# Patient Record
Sex: Male | Born: 1955 | Race: White | Hispanic: No | Marital: Married | Smoking: Never smoker
Health system: Southern US, Community
[De-identification: ages and names within clinical notes are randomized; demographics above are authoritative.]

## PROBLEM LIST (undated history)

## (undated) DIAGNOSIS — I10 Essential (primary) hypertension: Secondary | ICD-10-CM

## (undated) DIAGNOSIS — Z8349 Family history of other endocrine, nutritional and metabolic diseases: Secondary | ICD-10-CM

## (undated) DIAGNOSIS — G610 Guillain-Barre syndrome: Secondary | ICD-10-CM

## (undated) DIAGNOSIS — E785 Hyperlipidemia, unspecified: Secondary | ICD-10-CM

## (undated) DIAGNOSIS — I493 Ventricular premature depolarization: Secondary | ICD-10-CM

## (undated) DIAGNOSIS — G4730 Sleep apnea, unspecified: Secondary | ICD-10-CM

## (undated) DIAGNOSIS — K37 Unspecified appendicitis: Secondary | ICD-10-CM

## (undated) DIAGNOSIS — Z79899 Other long term (current) drug therapy: Secondary | ICD-10-CM

## (undated) DIAGNOSIS — K5731 Diverticulosis of large intestine without perforation or abscess with bleeding: Secondary | ICD-10-CM

## (undated) DIAGNOSIS — C61 Malignant neoplasm of prostate: Secondary | ICD-10-CM

## (undated) DIAGNOSIS — E119 Type 2 diabetes mellitus without complications: Secondary | ICD-10-CM

## (undated) DIAGNOSIS — R609 Edema, unspecified: Secondary | ICD-10-CM

## (undated) DIAGNOSIS — I272 Pulmonary hypertension, unspecified: Secondary | ICD-10-CM

## (undated) DIAGNOSIS — D696 Thrombocytopenia, unspecified: Secondary | ICD-10-CM

## (undated) DIAGNOSIS — IMO0001 Reserved for inherently not codable concepts without codable children: Secondary | ICD-10-CM

## (undated) DIAGNOSIS — I2510 Atherosclerotic heart disease of native coronary artery without angina pectoris: Secondary | ICD-10-CM

## (undated) DIAGNOSIS — K921 Melena: Secondary | ICD-10-CM

## (undated) HISTORY — DX: Essential (primary) hypertension: I10

## (undated) HISTORY — DX: Malignant neoplasm of prostate: C61

## (undated) HISTORY — DX: Ventricular premature depolarization: I49.3

## (undated) HISTORY — DX: Sleep apnea, unspecified: G47.30

## (undated) HISTORY — PX: APPENDECTOMY: SHX54

## (undated) HISTORY — DX: Guillain-Barre syndrome: G61.0

## (undated) HISTORY — DX: Diverticulosis of large intestine without perforation or abscess with bleeding: K57.31

## (undated) HISTORY — PX: OTHER SURGICAL HISTORY: SHX169

## (undated) HISTORY — DX: Type 2 diabetes mellitus without complications: E11.9

## (undated) HISTORY — DX: Reserved for inherently not codable concepts without codable children: IMO0001

## (undated) HISTORY — DX: Other long term (current) drug therapy: Z79.899

## (undated) HISTORY — DX: Unspecified appendicitis: K37

## (undated) HISTORY — DX: Melena: K92.1

## (undated) HISTORY — DX: Thrombocytopenia, unspecified: D69.6

## (undated) HISTORY — DX: Edema, unspecified: R60.9

## (undated) HISTORY — DX: Family history of other endocrine, nutritional and metabolic diseases: Z83.49

## (undated) HISTORY — DX: Morbid (severe) obesity due to excess calories: E66.01

## (undated) HISTORY — DX: Atherosclerotic heart disease of native coronary artery without angina pectoris: I25.10

## (undated) HISTORY — DX: Hyperlipidemia, unspecified: E78.5

---
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
## 1995-01-16 DIAGNOSIS — I2510 Atherosclerotic heart disease of native coronary artery without angina pectoris: Secondary | ICD-10-CM

## 1995-01-16 HISTORY — PX: ANGIOPLASTY: SHX39

## 1995-01-16 HISTORY — DX: Atherosclerotic heart disease of native coronary artery without angina pectoris: I25.10

## 2004-02-07 ENCOUNTER — Ambulatory Visit (HOSPITAL_COMMUNITY): Admission: RE | Admit: 2004-02-07 | Discharge: 2004-02-07 | Payer: Self-pay | Admitting: Family Medicine

## 2006-06-06 LAB — HM COLONOSCOPY

## 2007-01-16 DIAGNOSIS — K5731 Diverticulosis of large intestine without perforation or abscess with bleeding: Secondary | ICD-10-CM

## 2007-01-16 HISTORY — DX: Diverticulosis of large intestine without perforation or abscess with bleeding: K57.31

## 2007-05-02 ENCOUNTER — Inpatient Hospital Stay (HOSPITAL_COMMUNITY): Admission: EM | Admit: 2007-05-02 | Discharge: 2007-05-06 | Payer: Self-pay | Admitting: Emergency Medicine

## 2007-05-05 ENCOUNTER — Encounter (INDEPENDENT_AMBULATORY_CARE_PROVIDER_SITE_OTHER): Payer: Self-pay | Admitting: Gastroenterology

## 2008-09-21 ENCOUNTER — Emergency Department (HOSPITAL_COMMUNITY): Admission: EM | Admit: 2008-09-21 | Discharge: 2008-09-21 | Payer: Self-pay | Admitting: Family Medicine

## 2008-12-29 ENCOUNTER — Ambulatory Visit (HOSPITAL_COMMUNITY): Admission: RE | Admit: 2008-12-29 | Discharge: 2008-12-29 | Payer: Self-pay | Admitting: Cardiology

## 2009-06-22 ENCOUNTER — Emergency Department (HOSPITAL_COMMUNITY): Admission: EM | Admit: 2009-06-22 | Discharge: 2009-06-22 | Payer: Self-pay | Admitting: Family Medicine

## 2010-01-15 DIAGNOSIS — C61 Malignant neoplasm of prostate: Secondary | ICD-10-CM

## 2010-01-15 HISTORY — DX: Malignant neoplasm of prostate: C61

## 2010-03-13 ENCOUNTER — Ambulatory Visit: Payer: 59 | Attending: Radiation Oncology | Admitting: Radiation Oncology

## 2010-03-13 DIAGNOSIS — Z79899 Other long term (current) drug therapy: Secondary | ICD-10-CM | POA: Insufficient documentation

## 2010-03-13 DIAGNOSIS — I10 Essential (primary) hypertension: Secondary | ICD-10-CM | POA: Insufficient documentation

## 2010-03-13 DIAGNOSIS — K219 Gastro-esophageal reflux disease without esophagitis: Secondary | ICD-10-CM | POA: Insufficient documentation

## 2010-03-13 DIAGNOSIS — Z800 Family history of malignant neoplasm of digestive organs: Secondary | ICD-10-CM | POA: Insufficient documentation

## 2010-03-13 DIAGNOSIS — C61 Malignant neoplasm of prostate: Secondary | ICD-10-CM | POA: Insufficient documentation

## 2010-03-13 DIAGNOSIS — I252 Old myocardial infarction: Secondary | ICD-10-CM | POA: Insufficient documentation

## 2010-03-13 DIAGNOSIS — E785 Hyperlipidemia, unspecified: Secondary | ICD-10-CM | POA: Insufficient documentation

## 2010-03-13 DIAGNOSIS — I2510 Atherosclerotic heart disease of native coronary artery without angina pectoris: Secondary | ICD-10-CM | POA: Insufficient documentation

## 2010-03-13 DIAGNOSIS — G4730 Sleep apnea, unspecified: Secondary | ICD-10-CM | POA: Insufficient documentation

## 2010-04-05 ENCOUNTER — Other Ambulatory Visit (HOSPITAL_COMMUNITY): Payer: Self-pay | Admitting: Cardiology

## 2010-04-05 DIAGNOSIS — I2510 Atherosclerotic heart disease of native coronary artery without angina pectoris: Secondary | ICD-10-CM

## 2010-04-13 ENCOUNTER — Other Ambulatory Visit: Payer: Self-pay | Admitting: Cardiology

## 2010-04-13 ENCOUNTER — Ambulatory Visit (HOSPITAL_COMMUNITY)
Admission: RE | Admit: 2010-04-13 | Discharge: 2010-04-13 | Disposition: A | Discharge status: Home / Self Care | Payer: 59 | Source: Ambulatory Visit | Attending: Cardiology | Admitting: Cardiology

## 2010-04-13 DIAGNOSIS — Z01810 Encounter for preprocedural cardiovascular examination: Secondary | ICD-10-CM | POA: Insufficient documentation

## 2010-04-13 DIAGNOSIS — I10 Essential (primary) hypertension: Secondary | ICD-10-CM | POA: Insufficient documentation

## 2010-04-13 DIAGNOSIS — I252 Old myocardial infarction: Secondary | ICD-10-CM | POA: Insufficient documentation

## 2010-04-20 ENCOUNTER — Institutional Professional Consult (permissible substitution): Payer: Commercial Managed Care - PPO | Admitting: Pulmonary Disease

## 2010-04-21 LAB — CULTURE, ROUTINE-ABSCESS

## 2010-05-01 ENCOUNTER — Encounter (HOSPITAL_COMMUNITY)
Admission: RE | Admit: 2010-05-01 | Discharge: 2010-05-01 | Disposition: A | Discharge status: Home / Self Care | Payer: 59 | Source: Ambulatory Visit | Attending: Cardiology | Admitting: Cardiology

## 2010-05-01 ENCOUNTER — Ambulatory Visit (HOSPITAL_COMMUNITY): Payer: Commercial Managed Care - PPO

## 2010-05-01 DIAGNOSIS — Z01810 Encounter for preprocedural cardiovascular examination: Secondary | ICD-10-CM | POA: Insufficient documentation

## 2010-05-01 DIAGNOSIS — I2510 Atherosclerotic heart disease of native coronary artery without angina pectoris: Secondary | ICD-10-CM | POA: Insufficient documentation

## 2010-05-01 DIAGNOSIS — I10 Essential (primary) hypertension: Secondary | ICD-10-CM | POA: Insufficient documentation

## 2010-05-02 ENCOUNTER — Encounter (HOSPITAL_COMMUNITY)
Admission: RE | Admit: 2010-05-02 | Discharge: 2010-05-02 | Disposition: A | Discharge status: Home / Self Care | Payer: 59 | Source: Ambulatory Visit | Attending: Cardiology | Admitting: Cardiology

## 2010-05-02 MED ORDER — TECHNETIUM TC 99M TETROFOSMIN IV KIT
30.0000 | PACK | Freq: Once | INTRAVENOUS | Status: AC | PRN
  Administered 2010-05-02: 30 via INTRAVENOUS

## 2010-05-02 MED ORDER — TECHNETIUM TC 99M TETROFOSMIN IV KIT
10.0000 | PACK | Freq: Once | INTRAVENOUS | Status: AC | PRN
  Administered 2010-05-02: 10 via INTRAVENOUS

## 2010-05-19 ENCOUNTER — Ambulatory Visit
Admission: RE | Admit: 2010-05-19 | Discharge: 2010-05-19 | Disposition: A | Discharge status: Home / Self Care | Payer: 59 | Source: Ambulatory Visit | Attending: Radiation Oncology | Admitting: Radiation Oncology

## 2010-05-19 ENCOUNTER — Other Ambulatory Visit: Payer: Self-pay | Admitting: Radiation Oncology

## 2010-05-19 DIAGNOSIS — C61 Malignant neoplasm of prostate: Secondary | ICD-10-CM

## 2010-05-30 NOTE — Op Note (Signed)
NAMEWEBER, MONNIER                 ACCOUNT NO.:  [id number]   MEDICAL RECORD NO.:  [id number]          PATIENT TYPE:  INP   LOCATION:  2623                         FACILITY:  [facility]   PHYSICIAN:  Danise Edge, M.D.   DATE OF BIRTH:  1955-06-26   DATE OF PROCEDURE:  DATE OF DISCHARGE:                               OPERATIVE REPORT   PROBLEM:  Lower gastrointestinal bleeding (passage of maroon-appearing  stool).   HISTORY:  Travis [name] is a 55 year old male born on 14-Feb-1955.  This morning, Travis [name] had 2-3 maroon-colored bowel movements  associated with feeling faint, but unassociated with any significant  abdominal pain.  He tells me that he has had blood per rectum in the  past, and he had a symptomatic external hemorrhoid.  He has a history of  coronary artery disease.  He takes enteric-coated aspirin daily.  He  uses Alka-Seltzer and Advil on a relative frequent basis.  There is no  past history of needing endoscopic evaluation of intestinal tract for  his bleeding.  He reports no nausea, vomiting, or hematemesis.   MEDICATION ALLERGIES:  None.   PAST MEDICAL/SURGICAL HISTORY:  1. Remote appendectomy.  2. Myocardial infarction in 1997, managed by Dr. Aleen Campi.  3. Hypertension.  4. Hyperlipidemia.  5. Hemorrhoids.  6. Gastroesophageal reflux.  7. History of Bell's palsy.  8. Osteoarthritis.   CHRONIC MEDICATIONS:  On admission:  1. Crestor.  2. Hydrochlorothiazide.  3. Lisinopril.  4. Toprol.  5. TriCor.  6. Enteric-coated aspirin.  7. Alka-Seltzer.   HABITS:  Travis [name] does not use tobacco products or consume alcohol.   SURGEON:  Danise Edge, MD   PREMEDICATION:  1. Fentanyl 50 mcg.  2. Versed 5 mg.   PROCEDURE:  After obtaining informed consent, Travis [name] was placed in  the left lateral decubitus position.  I administered intravenous  fentanyl and intravenous Versed to achieve conscious sedation for the  procedure.  The patient's blood  pressure, oxygen saturation, and cardiac  rhythm were monitored throughout the procedure and documented in the  medical record.   The Pentax gastroscope was passed through the posterior hypopharynx into  the proximal esophagus without difficulty.  The hypopharynx, larynx, and  vocal cords appeared normal.   Esophagoscopy:  The proximal mid and lower segments of the esophageal  mucosa appear normal.  Despite his history of gastroesophageal reflux, I  did not detect Barrett's esophagus or erosive esophagitis.   Gastroscopy:  Retroflex view of the gastric cardia and fundus was  normal.  Normal esophagogastroduodenoscopy.  No signs of upper  gastrointestinal bleeding.   RECOMMENDATIONS:  Travis [name] has a probable diverticular bleed.  There  is a 90% chance that he will stop on his own spontaneously.  He will  eventually need a diagnostic colonoscopy.           ______________________________  Danise Edge, M.D.     MJ/MEDQ  D:  05/02/2007  T:  05/03/2007  Job:  [id number]   cc:   Antionette Char, MD  Vikki Ports, M.D.

## 2010-05-30 NOTE — Op Note (Signed)
[name], Travis [name]                 ACCOUNT NO.:  [id number]   MEDICAL RECORD NO.:  [id number]           PATIENT TYPE:   LOCATION:                                 FACILITY:   PHYSICIAN:  Travis L. Malon Kindle., M.D.DATE OF BIRTH:  [birth year]   DATE OF PROCEDURE:  05/05/2007  DATE OF DISCHARGE:                               OPERATIVE REPORT   PROCEDURE:  Colonoscopy.   MEDICATIONS:  1. Fentanyl 75 mcg.  2. Versed 6 mg IV.   INDICATIONS:  GI bleed with upper endoscopy, negative.   DESCRIPTION OF PROCEDURE:  Procedure explained to the patient and  consent obtained.  Left lateral decubitus position, the adult  colonoscope was inserted and advanced.  The prep was excellent reach to  cecum without difficulty.  Ileocecal valve and appendiceal orifice seen.  Scope was withdrawn.  There was no active bleeding.  The mucosa was  mostly normal except in left colon, there were several small 2-3 mm  plaques.  It were biopsied.  They were slightly ulcerated, but no active  bleeding.  There are multiple small diverticula throughout the left  colon and sigmoid colon, but none were actively bleeding.  Scope was  withdrawn from the rectum.  Some hemorrhoids in the rectum fairly small  none were bleeding.  The scope was withdrawn.  The patient tolerated the  procedure well.   ASSESSMENT:  1. Lower gastrointestinal bleed almost certainly due to fairly      extensive diverticulosis in left colon.  2. Small colonic plaques probably insignificant with biopsy.   PLAN:  We will feed the patient, given diverticulosis information, and  follow up in the office in 2-3 weeks.           ______________________________  Travis Aliment Malon Kindle., M.D.     Waldron Session  D:  05/05/2007  T:  05/06/2007  Job:  [id number]   cc:   Travis [name], M.D.

## 2010-05-30 NOTE — H&P (Signed)
Travis [name], Travis [name]                 ACCOUNT NO.:  [id number]   MEDICAL RECORD NO.:  [id number]          PATIENT TYPE:  INP   LOCATION:  2623                         FACILITY:  [facility]   PHYSICIAN:  Ramiro Harvest, MD    DATE OF BIRTH:  1955-10-27   DATE OF ADMISSION:  05/02/2007  DATE OF DISCHARGE:                              HISTORY & PHYSICAL   PRIMARY CARE PHYSICIAN:  Dr. Lanell Persons   HISTORY OF PRESENT ILLNESS:  Travis [name] is a 55 year old obese white  male with history of hypertension, hemorrhoids, and hyperlipidemia on  daily enteric-coated aspirin and occasional Alka-Seltzer use who  presents to the ED with several-hour history of bright red blood per  rectum.  The patient states he woke up this morning and had a bowel  movement with some bright red blood in it.  He then went on to work and  thought he may have had diarrhea but had episodes of bloody stools.  The  patient started to feel very weak and lightheaded and decided to go  home.  The patient got home and got as far as his porch where he sat  down secondary to his lightheadedness and had another episode of bright  red blood per rectum with some left lower quadrant abdominal cramping  which he states is also sharp in nature.  The patient called his parents  who brought him to the ED where he had another episode of bright red  blood per rectum.  The patient endorses some palpitations and  generalized weakness.  The patient denies chest pain, no shortness of  breath, no nausea, no vomiting, no hematemesis, and no other associated  symptoms.  The patient denies any alcohol use.  No prior episodes of GI  bleed.  No history of a peptic ulcer disease.  In the ED, the patient  had a decrease in his blood pressure into the low 90s.  A CBC obtained  in the ED, he had a hemoglobin of 12.5.  We call to admit patient for  further evaluation and management.   ALLERGIES:  No known drug allergies.   PAST MEDICAL HISTORY:  1.  Hypertension.  2. Hyperlipidemia.  3. Hemorrhoids.  4. Gastroesophageal reflux disease.  5. Allergies.  6. Status post appendectomy in 1977.  7. Questionable history of light MI per the patient in 1997.  8. History of Bell's palsy.  9. Obstructive sleep apnea.   HOME MEDICATIONS:  1. Crestor 20 mg daily.  2. HCTZ 25 mg daily.  3. Lisinopril 20 mg daily.  4. Toprol-XL 20 mg daily.  5. TriCor 145 mg daily.  6. Enteric-coated aspirin 325 mg daily.  7. Alka-Seltzer as needed.   SOCIAL HISTORY:  No tobacco use.  No alcohol use.  No IV drug use.  The  patient is married, works at [employer] in the [employer],  and the patient has no children.   FAMILY HISTORY:  Father alive at age 35, history of hyperlipidemia,  hypertension, colonic polyps, and hernia.  Mother alive at age 49  history of gastroesophageal reflux disease,  hypertension, and atrial  fibrillation and has one sister with hypertension.  No family history of  GI bleed.   REVIEW OF SYSTEMS:  As per HPI, otherwise negative.   PHYSICAL EXAMINATION:  VITAL SIGNS:  Temperature 97.0, blood pressure  128/89 to systolic blood pressure of 96, pulse of 99, respiratory rate  20, satting 100% on room air.  GENERAL: The patient is pale in no apparent distress.  HEENT: Normocephalic and atraumatic.  Pupils are equal, round, and  reactive to light.  Extraocular movements intact.  Oropharynx is clear  and dry.  No lesions or exudates.  NECK:  Supple.  No lymphadenopathy.  RESPIRATORY:  Lungs are clear to auscultation bilaterally.  No wheezes.  No rhonchi.  CARDIOVASCULAR: Regular rate and rhythm.  No murmurs, rubs, or gallops.  ABDOMEN: Soft, nontender, and nondistended.  Positive bowel sounds.  EXTREMITIES: No clubbing, cyanosis, or edema.  NEUROLOGIC:  The patient is alert and oriented x3.  Cranial nerves II  through XII are grossly intact.  No focal deficits.  5/5 bilateral upper  extremity strength and 5/5 bilateral  lower extremity strength.  Sensation is intact.  Gait not tested secondary to safety.   LABORATORY DATA:  Sodium 140, potassium 3.2, chloride 103, bicarb 26,  BUN 22, creatinine 0.91, glucose 184, calcium of 8.8, bilirubin 0.3, alk  phosphatase 50, AST 33, ALT 32, protein 6.2, albumin 3.5, PTT 29, PT  14.7, INR 1.1, and FOBT was positive.  CBC, white count 15.2, hemoglobin  12.5, platelets 358, hematocrit 35.7, and ANC of 13.1.   ASSESSMENT AND PLAN:  Mr. Travis [name] is a 55 year old gentleman with  history of hemorrhoids, history of hypertension, and hyperlipidemia,  daily coated aspirin use and Alka-Seltzer as needed who presents to the  ED with bright red blood per rectum.  1. GI bleed, likely a lower GI bleed secondary to diverticular disease      versus upper GI bleed.  We will type and cross, place 2 large-bore      IVs and make the patient n.p.o., admit to the step-down unit at 2      liters normal saline bolus and then on 125 mL an hour.  Check CBCs      now and then q.8 hours, hold aspirin.  Protonix 40 mg IV q. 12,      will consult with GI for possible scope and further evaluation and      recommendations.  2. Hypertension.  Hold blood pressure medications secondary to GI      bleed.  3. Hypokalemia, replete.  4. Leukocytosis.  The patient is afebrile without any acute need for      antibiotics at this time likely secondary to GI bleed.  We will      check his urinalysis and follow.  5. Hyperlipidemia.  Check a fasting lipid panel.  Continue home      regimen of Crestor and Tricor.  6. Gastroesophageal reflux disease, Protonix.  7. Hemorrhoids.  8. Obstructive sleep apnea CPAP nightly.  9. Allergies.  10.Prophylaxis, Protonix for GI prophylaxis, and SCDs for DVT      prophylaxis.   It was a pleasure taking care of Mr. Travis [name].      Ramiro Harvest, MD  Electronically Signed     DT/MEDQ  D:  05/02/2007  T:  05/03/2007  Job:  [id number]   cc:   Vikki Ports, M.D.

## 2010-06-02 NOTE — Discharge Summary (Signed)
[name], [name]                 ACCOUNT NO.:  [id number]   MEDICAL RECORD NO.:  [id number]          PATIENT TYPE:  INP   LOCATION:  5002                         FACILITY:  [facility]   PHYSICIAN:  Ramiro Harvest, MD    DATE OF BIRTH:  1955/06/01   DATE OF ADMISSION:  05/02/2007  DATE OF DISCHARGE:  05/06/2007                               DISCHARGE SUMMARY   DISCHARGE DIAGNOSES:  1. Lower gastrointestinal bleed likely secondary to a diverticular      bleed and colonic ulcerations.  2. Hypertension.  3. Hyperlipidemia.  4. Obstructive sleep apnea.  5. Hypokalemia.  6. Leukocytosis secondary to lower gastrointestinal bleed likely      secondary to a diverticular bleed and colonic ulcerations.  7. Gastroesophageal reflux disease.  8. Hemorrhoids.  9. History of Bell's palsy.  10.Seasonal allergies.  11.Status post appendectomy.  12.History of myocardial infarction in 1997.   DISCHARGE MEDICATIONS:  1. MiraLax q. daily.  2. Crestor 20 mg p.o. q. daily  3. HCTZ 25 mg p.o. q. daily  4. Lisinopril 20 mg p.o. q. daily.  5. Toprol-XL 20 mg p.o. q. daily.  6. Tricor 145 mg p.o. q. daily.  7. Enteric coated aspirin 325 mg p.o. q. Daily.   DISPOSITION AND FOLLOW-UP:  The patient will be discharged home to  follow up with gastroenterologist, Dr. Randa Evens in the next 2-3 weeks  post discharge.   CONSULTATIONS DONE:  A GI consult was obtained on May 02, 2007.  The  patient was seen in consultation by Dr. Laural Benes of [facility].   PROCEDURES PERFORMED:  1. EGD was performed on May 02, 2007, which showed mid and lower      segments of the esophageal mucosa were normal.  Negative for      Barrett's esophagus, negative for erosive esophagitis.  Retroflex      few of the gastric cardia and fundus was normal.  No signs of upper      GI bleed.  2. A colonoscopy was performed on May 05, 2007, which shows a lower      GI bleed almost certainly due to firmly extensive diverticulosis in   the left colon, small colonic plaques probably insignificant with      biopsy.   BRIEF ADMISSION HISTORY AND PHYSICAL:  Mr. Travis [name] is a 55 year old  white obese male with a history of hypertension, hemorrhoids,  hyperlipidemia on daily enteric-coated aspirin, and occasional Alka-  Seltzer, who presented to the ED with a several-hour history of bright  red blood per rectum.  The patient stated that he woke up on the morning  of admission had a bowel movement and saw some bright red blood in it.  He then went on to work and thought he may have had diarrhea but had  episodes of bloody stools.  The patient started to feel very weak and  lightheaded and decided to go back home.  The patient got home, got as  far as the bouts of porch when he sat down and secondary to his  lightheadedness, he also had another episode  of bright red blood per  rectum with some left lower quadrant abdominal cramping, which she  stated was also sharp in nature.  The patient called his parents who  brought him to the emergency room.  He had another episode of bright red  blood per rectum in the ED.  The patient endorsed some palpitations and  generalized weakness.  The patient denied chest pain or shortness of  breath.  No nausea, no vomiting, or hematemesis.  No other associated  symptoms.  The patient denied any alcohol use.  No prior episodes of GI  bleed.  No history of peptic ulcer disease.  In the ED, the patient had  a decrease in his blood pressure into the low 90s.  CBC was obtained in  the ED with a hemoglobin of 12.5.  We were called to admit the patient  for further evaluation and management.   PHYSICAL EXAMINATION:  Temperature 97.0, blood pressure 128/89-96  systolic, pulse of 99, respiratory rate 20, sating 100% on room air.  GENERAL:  The patient was obese male who was pale and in no apparent  distress.  HEENT:  Normocephalic, atraumatic.  Pupils equal, round, and reactive to  light.   Extraocular movements intact.  Oropharynx was clear, dry, and no  lesions.  No exudates.  NECK:  Supple.  No lymphadenopathy.  RESPIRATORY:  Lungs were clear to auscultation bilaterally.  No wheezes,  no rhonchi.  CARDIOVASCULAR:  Regular rate and rhythm.  No murmurs, rubs, or gallops.  ABDOMEN:  Soft, nontender, nondistended, positive bowel sounds.  EXTREMITIES:  No clubbing, cyanosis or edema.  NEUROLOGICAL:  The patient was alert and oriented x3.  Cranial nerves II-  XII was grossly intact.  No focal deficits, 5/5 bilateral upper  extremity strength, 5/5 bilateral lower extremity strength.  Sensation  was intact.  Gait was not tested secondary to safety.   LABORATORIES:  Sodium 140, potassium 3.2, chloride 103, bicarb 26, BUN  22, creatinine 0.91, glucose 184, calcium was 8.8, bilirubin 0.3, alk  phosphatase 50, ANC 33, ALT 32, protein 6.2, albumin 3.5, PTT 29, PT  14.7, INR 1.1.  FOBT was positive.  CBC:  White count 15.2, hemoglobin  12.5, platelets 358, hematocrit 35.7, ANC of 13.1.   HOSPITAL COURSE:  1. Lower GI bleed secondary to diverticular bleed and colonic      ulcerations.  The patient was initially admitted to the step-down      unit secondary to systolic blood pressures in the 90s.  The patient      was made n.p.o.  Two large-bore IVs were placed.  The patient was      placed on fluid with aggressive fluid resuscitation.  CBC was      checked every 8 hours.  The patient was placed on IV Protonix      b.i.d.  The patient's aspirin was held and a GI consult was      obtained.  The patient was seen in consultation.  An EGD was done      with results as stated above which was negative.  The patient on      the second day of hospitalization, still was passing some blood      clots per rectum.  The patient was thus scheduled for a      colonoscopy.  The patient's hemoglobin decreased as low as 8.2, but      then remained stable and did not decrease any further.  The  patient  was typed and crossed 2 units which were held, as the patient's      CBCs were monitored.  The patient's symptoms improved.  The patient      did not have any more bowel movements pass the second day of      hospitalization.  The patient's weakness started to improve and the      patient did no longer have any lightheadedness.  A colonoscopy was      done on May 05, 2007 with results as stated above.  The patient      remained stable for the rest of the hospitalization and the patient      was discharged in stable and improved condition to follow up with      his gastroenterologist, Dr. Randa Evens in 2-3 weeks post discharge.      The patient was discharged in a stable and improved condition.  2. Hypertension.  The patient's blood pressure medications were held      throughout the hospitalization secondary to lower GI bleed      secondary to diverticular bleed and colonic ulcerations and were      restarted on discharge.  3. Hyperlipidemia, stable.  The patient was maintained on his home      doses of Crestor and Tricor whilst in the hospital.  4. Obstructive sleep apnea, stable.  The patient was maintained on a      CPAP during the hospitalization.  5. Hypokalemia.  The patient's potassium was repleted and remained      stable for the rest of the hospitalization.  The rest of the      patient's chronic medical issues were stable throughout the      hospitalization, and on the day of discharge, the patient was in      stable and improved condition with no more GI bleeds.   Vital Signs on the day of discharge, temperature 98.5, pulse of 77,  blood pressure 148/96, respiratory rate 80, sating 96% on room air.   DISCHARGE LABORATORIES:  B-MET sodium 136, potassium 3.6, chloride 104,  bicarb 25, BUN 9, creatinine 0.98, and glucose of 111 and a calcium of  8.3.  CBC on day of discharge, white count 6.7, hemoglobin 9.4,  hematocrit 26.5, platelets 269.   It was a pleasure  taking care of Mr. Junker.      Ramiro Harvest, MD  Electronically Signed     DT/MEDQ  D:  06/12/2007  T:  06/12/2007  Job:  [id number]   cc:   Vikki Ports, M.D.  James L. Malon Kindle., M.D.  Danise Edge, M.D.

## 2010-06-16 ENCOUNTER — Other Ambulatory Visit: Payer: Self-pay | Admitting: Urology

## 2010-06-16 ENCOUNTER — Encounter (HOSPITAL_COMMUNITY): Payer: 59

## 2010-06-16 LAB — COMPREHENSIVE METABOLIC PANEL
ALT: 32 U/L (ref 0–53)
AST: 31 U/L (ref 0–37)
Albumin: 3.6 g/dL (ref 3.5–5.2)
Alkaline Phosphatase: 74 U/L (ref 39–117)
BUN: 16 mg/dL (ref 6–23)
CO2: 26 mEq/L (ref 19–32)
Calcium: 9 mg/dL (ref 8.4–10.5)
Chloride: 101 mEq/L (ref 96–112)
Creatinine, Ser: 0.79 mg/dL (ref 0.4–1.5)
GFR calc Af Amer: >60 mL/min (ref >60)
GFR calc non Af Amer: >60 mL/min (ref >60)
Glucose, Bld: 93 mg/dL (ref 70–99)
Potassium: 3.8 mEq/L (ref 3.5–5.1)
Sodium: 137 mEq/L (ref 135–145)
Total Bilirubin: 0.4 mg/dL (ref 0.3–1.2)
Total Protein: 7.1 g/dL (ref 6.0–8.3)

## 2010-06-16 LAB — CBC
HCT: 39.5 % (ref 39.0–52.0)
Hemoglobin: 13.1 g/dL (ref 13.0–17.0)
MCH: 28.6 pg (ref 26.0–34.0)
MCHC: 33.2 g/dL (ref 30.0–36.0)
MCV: 86.2 fL (ref 78.0–100.0)
Platelets: 219 10*3/uL (ref 150–400)
RBC: 4.58 MIL/uL (ref 4.22–5.81)
RDW: 13.4 % (ref 11.5–15.5)
WBC: 8.8 10*3/uL (ref 4.0–10.5)

## 2010-06-16 LAB — SURGICAL PCR SCREEN
MRSA, PCR: NEGATIVE
Staphylococcus aureus: NEGATIVE

## 2010-06-16 LAB — PROTIME-INR
INR: 1.19 (ref 0.00–1.49)
Prothrombin Time: 15.3 seconds — ABNORMAL HIGH (ref 11.6–15.2)

## 2010-06-16 LAB — APTT: aPTT: 37 seconds (ref 24–37)

## 2010-06-23 ENCOUNTER — Ambulatory Visit (HOSPITAL_COMMUNITY)
Admission: RE | Admit: 2010-06-23 | Discharge: 2010-06-23 | Disposition: A | Discharge status: Home / Self Care | Payer: 59 | Source: Ambulatory Visit | Attending: Urology | Admitting: Urology

## 2010-06-23 ENCOUNTER — Ambulatory Visit (HOSPITAL_COMMUNITY): Payer: 59

## 2010-06-23 DIAGNOSIS — Z79899 Other long term (current) drug therapy: Secondary | ICD-10-CM | POA: Insufficient documentation

## 2010-06-23 DIAGNOSIS — G4733 Obstructive sleep apnea (adult) (pediatric): Secondary | ICD-10-CM | POA: Insufficient documentation

## 2010-06-23 DIAGNOSIS — I10 Essential (primary) hypertension: Secondary | ICD-10-CM | POA: Insufficient documentation

## 2010-06-23 DIAGNOSIS — I252 Old myocardial infarction: Secondary | ICD-10-CM | POA: Insufficient documentation

## 2010-06-23 DIAGNOSIS — Z01812 Encounter for preprocedural laboratory examination: Secondary | ICD-10-CM | POA: Insufficient documentation

## 2010-06-23 DIAGNOSIS — I2510 Atherosclerotic heart disease of native coronary artery without angina pectoris: Secondary | ICD-10-CM | POA: Insufficient documentation

## 2010-06-23 DIAGNOSIS — N323 Diverticulum of bladder: Secondary | ICD-10-CM | POA: Insufficient documentation

## 2010-06-23 DIAGNOSIS — C61 Malignant neoplasm of prostate: Secondary | ICD-10-CM | POA: Insufficient documentation

## 2010-06-23 DIAGNOSIS — Z01818 Encounter for other preprocedural examination: Secondary | ICD-10-CM | POA: Insufficient documentation

## 2010-07-06 NOTE — Op Note (Signed)
Travis [name], Travis [name]                 ACCOUNT NO.:  [id number]  MEDICAL RECORD NO.:  [id number]  LOCATION:  DAYL                         FACILITY:  [facility]  PHYSICIAN:  Heleena Miceli C. Vernie Ammons, M.D.  DATE OF BIRTH:  12-20-1955  DATE OF PROCEDURE:  06/23/2010 DATE OF DISCHARGE:                              OPERATIVE REPORT   PREOPERATIVE DIAGNOSIS:  Adenocarcinoma of the prostate.  POSTOPERATIVE DIAGNOSIS:  Adenocarcinoma of the prostate.  PROCEDURES: 1. I-125 seed implant. 2. Cystoscopy with removal of foreign body.  SURGEON:  Silverio Hagan C. Vernie Ammons, M.D.  ANESTHESIA:  General.  DRAINS:  A 16-French Foley catheter.  SPECIMENS:  None.  BLOOD LOSS:  Minimal.  COMPLICATIONS:  None.  INDICATIONS:  The patient is a 55 year old male with biopsy-proven adenocarcinoma of the prostate found due to PSA elevation of 7.92.  His biopsy revealed Gleason 6 adenocarcinoma in 4/12 cores with no prostate abnormality giving him clinical stage T1c.  The treatment options, risks, complications and alternatives have been discussed and he has elected to proceed with radioactive seed implantation.  DESCRIPTION OF OPERATION:  After informed consent, the patient was brought to the major OR, placed on table and administered general anesthesia and then moved to the dorsal lithotomy position and his genitalia as well as perineum were sterilely prepped and draped.  A 16- French Foley catheter was then placed in the bladder and the balloon was filled with dilute contrast.  A transrectal ultrasound probe as well as rectal tube were then placed in the rectum and this was affixed to the stand.  Real-time ultrasonography was then used in conjunction with the planning software Spot Pro version 3.1 and a treatment plan was then devised.  After the treatment plan was complete, a second time-out was performed and I then implanted 21 needles with the placement of 84 seeds into the prostate under direct ultrasonographic  direction using the Nucletron and this proceeded without difficulty.  At the end of the procedure the transrectal ultrasound probe as well as rectal tube and Foley catheter were then removed.  Cystoscopy was then performed using the 17-French flexible scope.  The 17-French flexible scope was then passed under direct vision down the urethra which was noted be normal.  The prostatic urethra revealed bilobar hypertrophy and some slight elongation of the prostatic urethra. There was a single spacer identified protruding from the mid prostatic urethral region at approximately the 7 o'clock position out of the prostate and into the bladder.  I then entered the bladder and noted 1+ trabeculation.  There were 2 moderate sized wide-mouth diverticuli on the left floor and left wall of the bladder.  These were scoped and noted to be free of any lesions.  The remainder of the bladder was fully and systematically inspected and noted to be free of any tumors or stones.  Two spacers were identified on the floor of the bladder.  I used the flexible alligator forceps and passed through the flexible cystoscope, was able to grasp each spacer individually and extracted these.  Reinspection revealed no bleeding and no seeds or spacers remained in the bladder.  I therefore reinserted a new 16-French Foley catheter which was connected  to close system drainage and the patient was awakened and taken to recovery room in stable and satisfactory condition.  He tolerated the procedure well with no intraoperative complications.  He will be given a prescription for Vicodin HP #20 and Cipro 500 mg #10 to be taken b.i.d..  He will then follow up with me in 3 weeks.     Oaklie Durrett C. Vernie Ammons, M.D.     MCO/MEDQ  D:  06/23/2010  T:  06/23/2010  Job:  [id number]  cc:   Oneita Hurt, M.D. Fax: [phone]  Electronically Signed by Ihor Gully M.D. on 07/06/2010 05:23:17 PM

## 2010-07-14 ENCOUNTER — Ambulatory Visit
Admission: RE | Admit: 2010-07-14 | Discharge: 2010-07-14 | Disposition: A | Discharge status: Home / Self Care | Payer: 59 | Source: Ambulatory Visit | Attending: Radiation Oncology | Admitting: Radiation Oncology

## 2010-07-14 DIAGNOSIS — C61 Malignant neoplasm of prostate: Secondary | ICD-10-CM | POA: Insufficient documentation

## 2010-07-27 ENCOUNTER — Ambulatory Visit
Admission: RE | Admit: 2010-07-27 | Discharge: 2010-07-27 | Disposition: A | Discharge status: Home / Self Care | Payer: 59 | Source: Ambulatory Visit | Attending: Radiation Oncology | Admitting: Radiation Oncology

## 2010-07-27 DIAGNOSIS — C61 Malignant neoplasm of prostate: Secondary | ICD-10-CM | POA: Insufficient documentation

## 2010-10-10 LAB — DIFFERENTIAL
Basophils Absolute: 0
Basophils Absolute: 0.1
Basophils Relative: 0
Basophils Relative: 1
Eosinophils Absolute: 0
Eosinophils Relative: 0
Lymphocytes Relative: 9 — ABNORMAL LOW
Lymphs Abs: 1.4
Monocytes Absolute: 0.6
Monocytes Relative: 2 — ABNORMAL LOW
Monocytes Relative: 4
Neutro Abs: 12.9 — ABNORMAL HIGH
Neutro Abs: 13.1 — ABNORMAL HIGH
Neutrophils Relative %: 87 — ABNORMAL HIGH
Neutrophils Relative %: 90 — ABNORMAL HIGH

## 2010-10-10 LAB — LIPID PANEL
Cholesterol: 102
HDL: 24 — ABNORMAL LOW
Triglycerides: 129

## 2010-10-10 LAB — CBC
HCT: 24 — ABNORMAL LOW
HCT: 24.4 — ABNORMAL LOW
HCT: 26.4 — ABNORMAL LOW
HCT: 26.5 — ABNORMAL LOW
HCT: 35.7 — ABNORMAL LOW
Hemoglobin: 12.5 — ABNORMAL LOW
Hemoglobin: 8.2 — ABNORMAL LOW
Hemoglobin: 8.9 — ABNORMAL LOW
Hemoglobin: 9.4 — ABNORMAL LOW
Hemoglobin: 9.5 — ABNORMAL LOW
MCHC: 34.6
MCHC: 34.9
MCHC: 34.9
MCHC: 35
MCHC: 35.5
MCHC: 35.9
MCV: 87.3
MCV: 87.8
MCV: 88.3
MCV: 88.7
Platelets: 237
Platelets: 249
Platelets: 250
Platelets: 257
Platelets: 290
Platelets: 323
Platelets: 358
RBC: 2.71 — ABNORMAL LOW
RBC: 2.84 — ABNORMAL LOW
RBC: 2.98 — ABNORMAL LOW
RBC: 3.1 — ABNORMAL LOW
RBC: 3.7 — ABNORMAL LOW
RBC: 4.09 — ABNORMAL LOW
RDW: 13.4
RDW: 13.4
RDW: 13.4
RDW: 13.5
RDW: 13.6
RDW: 13.9
RDW: 13.9
WBC: 15.2 — ABNORMAL HIGH
WBC: 7.9
WBC: 8.1
WBC: 8.1

## 2010-10-10 LAB — BASIC METABOLIC PANEL
BUN: 7
CO2: 25
CO2: 29
Calcium: 8.1 — ABNORMAL LOW
Creatinine, Ser: 0.76
GFR calc Af Amer: >60
GFR calc non Af Amer: >60
GFR calc non Af Amer: >60
GFR calc non Af Amer: >60
Glucose, Bld: 108 — ABNORMAL HIGH
Glucose, Bld: 111 — ABNORMAL HIGH
Potassium: 3.6
Potassium: 3.9
Sodium: 136
Sodium: 140
Sodium: 143

## 2010-10-10 LAB — COMPREHENSIVE METABOLIC PANEL WITH GFR
ALT: 32
BUN: 22
CO2: 26
Calcium: 8.8
Creatinine, Ser: 0.91
GFR calc non Af Amer: >60
Glucose, Bld: 184 — ABNORMAL HIGH
Sodium: 140
Total Protein: 6.2

## 2010-10-10 LAB — COMPREHENSIVE METABOLIC PANEL
AST: 33
Albumin: 3.5
Alkaline Phosphatase: 50
Chloride: 103
GFR calc Af Amer: >60
Potassium: 3.2 — ABNORMAL LOW
Total Bilirubin: 0.3

## 2010-10-10 LAB — SAMPLE TO BLOOD BANK

## 2010-10-10 LAB — URINE CULTURE: Colony Count: 7000

## 2010-10-10 LAB — APTT: aPTT: 29

## 2010-10-10 LAB — TYPE AND SCREEN: Antibody Screen: NEGATIVE

## 2010-10-10 LAB — CK TOTAL AND CKMB (NOT AT ARMC)
CK, MB: 1.7
Relative Index: 1.1

## 2010-10-10 LAB — PROTIME-INR: INR: 1.1

## 2011-10-08 ENCOUNTER — Encounter (HOSPITAL_COMMUNITY): Payer: Self-pay

## 2011-10-08 ENCOUNTER — Emergency Department (HOSPITAL_COMMUNITY)
Admission: EM | Admit: 2011-10-08 | Discharge: 2011-10-08 | Disposition: A | Discharge status: Home / Self Care | Payer: Self-pay | Source: Home / Self Care | Attending: Family Medicine | Admitting: Family Medicine

## 2011-10-08 ENCOUNTER — Emergency Department (INDEPENDENT_AMBULATORY_CARE_PROVIDER_SITE_OTHER): Payer: 59

## 2011-10-08 DIAGNOSIS — S40012A Contusion of left shoulder, initial encounter: Secondary | ICD-10-CM

## 2011-10-08 DIAGNOSIS — S40019A Contusion of unspecified shoulder, initial encounter: Secondary | ICD-10-CM

## 2011-10-08 MED ORDER — DICLOFENAC POTASSIUM 50 MG PO TABS: 50.0000 mg | ORAL_TABLET | Freq: Three times a day (TID) | ORAL | Status: DC

## 2011-10-08 NOTE — ED Notes (Signed)
States he fell 9-20, and injured his shoulder in fall; NAD at present

## 2011-10-08 NOTE — ED Provider Notes (Signed)
History     CSN: [id number]  Arrival date & time 10/08/11  1256   First MD Initiated Contact with Patient 10/08/11 1423      Chief Complaint  Patient presents with  . Fall    (Consider location/radiation/quality/duration/timing/severity/associated sxs/prior treatment) Patient is a 56 y.o. male presenting with fall. The history is provided by the patient.  Fall The accident occurred more than 2 days ago. The fall occurred while walking (fell at [place] onto left side.). He landed on a hard floor. The point of impact was the left knee, left shoulder and left hip. The pain is present in the left shoulder. The pain is mild. He was ambulatory at the scene. Pertinent negatives include no abdominal pain and no loss of consciousness. The symptoms are aggravated by use of the injured limb.    Past Medical History  Diagnosis Date  . Hypertension   . Cancer   . High cholesterol     Past Surgical History  Procedure Date  . Appendectomy     History reviewed. No pertinent family history.  History  Substance Use Topics  . Smoking status: Never Smoker   . Smokeless tobacco: Not on file  . Alcohol Use: No      Review of Systems  Constitutional: Negative.   Respiratory: Negative.   Cardiovascular: Negative.   Gastrointestinal: Negative.  Negative for abdominal pain.  Musculoskeletal: Positive for myalgias. Negative for back pain, joint swelling and gait problem.  Neurological: Negative.  Negative for loss of consciousness.    Allergies  Review of patient's allergies indicates no known allergies.  Home Medications   Current Outpatient Rx  Name Route Sig Dispense Refill  . AMLODIPINE BESYLATE 10 MG PO TABS Oral Take 10 mg by mouth daily.    . ASPIRIN 325 MG PO TABS Oral Take 325 mg by mouth daily.    . FENOFIBRATE MICRONIZED 200 MG PO CAPS Oral Take 200 mg by mouth once.    . FUROSEMIDE 20 MG PO TABS Oral Take 20 mg by mouth 2 (two) times daily.    [name] LISINOPRIL 40 MG  PO TABS Oral Take 40 mg by mouth daily.    [name] DICLOFENAC POTASSIUM 50 MG PO TABS Oral Take 1 tablet (50 mg total) by mouth 3 (three) times daily. 30 tablet 0    BP 174/77  Pulse 88  Temp 98.3 F (36.8 C) (Oral)  Resp 20  SpO2 97%  Physical Exam  Nursing note and vitals reviewed. Constitutional: He is oriented to person, place, and time. He appears well-developed and well-nourished.  HENT:  Head: Normocephalic and atraumatic.  Neck: Normal range of motion. Neck supple.  Cardiovascular: Normal rate, normal heart sounds and intact distal pulses.   Pulmonary/Chest: He exhibits no tenderness.  Abdominal: There is no tenderness.  Musculoskeletal: He exhibits tenderness.       Left shoulder: He exhibits tenderness. He exhibits normal range of motion, no bony tenderness, no swelling, no crepitus, no deformity, normal pulse and normal strength.       Arms:      Legs: Neurological: He is alert and oriented to person, place, and time.  Skin: Skin is warm and dry.    ED Course  Procedures (including critical care time)  Labs Reviewed - No data to display No results found.   1. Contusion of shoulder, left       MDM          Linna Hoff, MD 10/11/11 2053

## 2012-01-02 ENCOUNTER — Encounter: Payer: Self-pay | Admitting: *Deleted

## 2012-01-03 NOTE — Progress Notes (Signed)
Patient attended the [employer] to [facility]ness: Hypertension/High Cholesterol nutrition class on 01/02/12.  Topics covered include:   1. Complications of Hyperlipidemia and/or Hypertension. 2. Ways to reduce risk of heart disease.  3. Identifying fat and sodium content on food labels. 4. Ways to decrease sodium intake. 5. Optimal amount of daily saturated fat intake. 6. Optimal amount of daily sodium intake.  7. Foods to limit/avoid on a heart healthy diet.  Patient to follow-up with [facility] prn.

## 2012-01-06 ENCOUNTER — Encounter (HOSPITAL_COMMUNITY): Payer: Self-pay | Admitting: Emergency Medicine

## 2012-01-06 ENCOUNTER — Emergency Department (HOSPITAL_COMMUNITY)
Admission: EM | Admit: 2012-01-06 | Discharge: 2012-01-06 | Disposition: A | Discharge status: Home / Self Care | Payer: 59 | Source: Home / Self Care | Attending: Emergency Medicine | Admitting: Emergency Medicine

## 2012-01-06 DIAGNOSIS — R195 Other fecal abnormalities: Secondary | ICD-10-CM

## 2012-01-06 DIAGNOSIS — R319 Hematuria, unspecified: Secondary | ICD-10-CM

## 2012-01-06 DIAGNOSIS — N390 Urinary tract infection, site not specified: Secondary | ICD-10-CM

## 2012-01-06 LAB — POCT URINALYSIS DIP (DEVICE)
Bilirubin Urine: NEGATIVE
Glucose, UA: NEGATIVE mg/dL
Ketones, ur: NEGATIVE mg/dL
Nitrite: NEGATIVE
pH: 6.5 (ref 5.0–8.0)

## 2012-01-06 MED ORDER — CIPROFLOXACIN HCL 500 MG PO TABS: 500.0000 mg | ORAL_TABLET | Freq: Two times a day (BID) | ORAL | Status: DC

## 2012-01-06 MED ORDER — TAMSULOSIN HCL 0.4 MG PO CAPS: 0.4000 mg | ORAL_CAPSULE | Freq: Every day | ORAL | Status: DC

## 2012-01-06 NOTE — ED Provider Notes (Signed)
Chief Complaint  Patient presents with  . 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 He thinks he may have passed a kidney stone this past Tuesday, 6 days ago. He passed a pea-sized piece of gravel. He did not have any flank or back pain at the time. He states he had a little bit of urethral discomfort as he passed the stone. Ever since then he's had some blood coming from the urethra, blood in his urine, burning, urgency, and frequency. His urine flow has been diminished. He had some bladder spasms. A couple of days ago he had some left flank pain that lasted about 5 seconds. He also has a history of prostate cancer and has had radioactive seeds. He is followed by Dr. Vernie Ammons. He denies any fever, chills, abdominal pain, nausea, or vomiting.  Review of Systems:  Other than noted above, the patient denies any of the following symptoms: General:  No fevers, chills, sweats, aches, or fatigue. GI:  No abdominal pain, back pain, nausea, vomiting, diarrhea, or constipation. GU:  No dysuria, frequency, urgency, hematuria, urethral discharge, penile lesions, penile pain, testicular pain, swelling, or mass, inguinal lymphadenopathy or incontinence.  PMFSH:  Past medical history, family history, social history, meds, and allergies were reviewed.  Physical Exam:   Vital signs:  BP 154/62  Pulse 91  Temp 98 F (36.7 C) (Oral)  Resp 22  SpO2 97% Gen:  Alert, oriented, in no distress. Lungs:  Clear to auscultation, no wheezes, rales or rhonchi. Heart:  Regular rhythm, no gallop or murmer. Abdomen:  Flat and soft.  No tenderness to palpation, guarding, or rebound.  No hepato-splenomegaly or mass.  Bowel sounds were normally active.  No hernia. Genital exam:  Testes were normal. The pain is could not be seen because of extreme obesity. Rectal exam:  No masses or tenderness. Prostate was normal in size and nontender. Stool is a  trace heme-positive. Back:  No CVA tenderness.  Skin:  Clear, warm and dry.  Labs:   Results for orders placed during the hospital encounter of 01/06/12  POCT URINALYSIS DIP (DEVICE)      Component Value Range   Glucose, UA NEGATIVE  NEGATIVE mg/dL   Bilirubin Urine NEGATIVE  NEGATIVE   Ketones, ur NEGATIVE  NEGATIVE mg/dL   Specific Gravity, Urine 1.015  1.005 - 1.030   Hgb urine dipstick LARGE (*) NEGATIVE   pH 6.5  5.0 - 8.0   Protein, ur 100 (*) NEGATIVE mg/dL   Urobilinogen, UA 1.0  0.0 - 1.0 mg/dL   Nitrite NEGATIVE  NEGATIVE   Leukocytes, UA LARGE (*) NEGATIVE    Other Labs Obtained at Urgent Care Center:  A urine culture was obtained.  Results are pending at this time and we will call about any positive results.  Assessment: The primary encounter diagnosis was UTI (lower urinary tract infection). Diagnoses of Hematuria and Heme positive stool were also pertinent to this visit.   Plan:   1.  The following meds were prescribed:   New Prescriptions   CIPROFLOXACIN (CIPRO) 500 MG TABLET    Take 1 tablet (500 mg total) by mouth every 12 (twelve) hours.   TAMSULOSIN HCL (FLOMAX) 0.4 MG CAPS    Take 1 capsule (0.4 mg total) by mouth daily.   2.  The patient was instructed in symptomatic care and handouts were given. 3.  The patient was told  to return if becoming worse in any way, if no better in 3 or 4 days, and given some red flag symptoms that would indicate earlier return.  Follow up:  The patient was told to follow up with Dr. Randa Evens for the positive stools and with Dr. Vernie Ammons for the blood in the urine. The patient was told that the differential diagnosis includes cancer and its of the utmost importance that he followup with his specialists to whom he has been referred.     Travis Likes, MD 01/06/12 [phone]

## 2012-01-06 NOTE — ED Notes (Signed)
Reports hematuria.  Patient says last Tuesday he passed a kidney stone and since then he has had some blood in his shorts.  Patient says he is very tender and urinary frequently.  Patient says it feels like bladder is having muscle spasms.

## 2012-05-08 ENCOUNTER — Other Ambulatory Visit (HOSPITAL_COMMUNITY): Payer: Self-pay | Admitting: Cardiology

## 2012-05-08 DIAGNOSIS — I2510 Atherosclerotic heart disease of native coronary artery without angina pectoris: Secondary | ICD-10-CM

## 2012-05-08 DIAGNOSIS — I4949 Other premature depolarization: Secondary | ICD-10-CM

## 2012-05-14 ENCOUNTER — Ambulatory Visit (HOSPITAL_COMMUNITY)
Admission: RE | Admit: 2012-05-14 | Discharge: 2012-05-14 | Disposition: A | Discharge status: Home / Self Care | Payer: 59 | Source: Ambulatory Visit | Attending: Cardiology | Admitting: Cardiology

## 2012-05-14 DIAGNOSIS — I252 Old myocardial infarction: Secondary | ICD-10-CM | POA: Insufficient documentation

## 2012-05-14 DIAGNOSIS — I2510 Atherosclerotic heart disease of native coronary artery without angina pectoris: Secondary | ICD-10-CM

## 2012-05-14 DIAGNOSIS — I4949 Other premature depolarization: Secondary | ICD-10-CM

## 2012-05-14 DIAGNOSIS — I10 Essential (primary) hypertension: Secondary | ICD-10-CM | POA: Insufficient documentation

## 2012-05-14 NOTE — Progress Notes (Signed)
  Echocardiogram 2D Echocardiogram has been performed.  Ellender Hose A 05/14/2012, 1:06 PM

## 2012-09-11 ENCOUNTER — Encounter: Payer: Self-pay | Admitting: *Deleted

## 2012-09-11 ENCOUNTER — Encounter: Payer: 59 | Attending: Family Medicine | Admitting: *Deleted

## 2012-09-11 DIAGNOSIS — E119 Type 2 diabetes mellitus without complications: Secondary | ICD-10-CM | POA: Insufficient documentation

## 2012-09-11 DIAGNOSIS — Z713 Dietary counseling and surveillance: Secondary | ICD-10-CM | POA: Insufficient documentation

## 2012-09-11 NOTE — Progress Notes (Signed)
Medical Nutrition Therapy:  Appt start time: 1500 end time:  1545.  Assessment:  Patient with morbid obesity and recent diagnosis of diabetes with an HgbA1c of ~6.5. He is here today for a combined visit with his wife. He has been through diabetes classes with wife previously and has retained some knowledge of the diet. He has been tracking his blood glucose at least 4 times daily and keeping a record of his intake. Blood glucose generally ranges from the 140-150s in the morning, and <130 during the day. He is not currently on medication for diabetes. He works for [employer] with a fairly active job. He does admit to larger portions (1-2 plates at [employer]).   MEDICATIONS: Reviewed   DIETARY INTAKE:   Usual eating pattern includes 3 meals and 1 snacks per day.  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   Nutritional Diagnosis:  [place]-3.3 Overweight/obesity As related to excessive energy intake.  As evidenced by BMI 56.1.    Intervention:  Nutrition counseling. We discussed strategies for weight loss, including balancing nutrients (carbs, protein, fat), portion control, healthy snacks, and exercise. We also discussed basic carb counting, including foods with carbs and meal planning.   Goals:  1. 1-2 pound weight loss per  week.  2. 4 carb servings at meals. 3. Reduce portion size of foods.  4. Increase vegetable intake. 5. Limit intake of high fat, fried foods.   Handouts given during visit include:  Weight loss tips  Yellow meal plan card  Monitoring/Evaluation:  Dietary intake, exercise, blood glucose, and body weight prn.

## 2012-10-27 ENCOUNTER — Ambulatory Visit (INDEPENDENT_AMBULATORY_CARE_PROVIDER_SITE_OTHER): Payer: 59 | Admitting: Family Medicine

## 2012-10-27 VITALS — BP 156/82 | HR 78 | Ht 69.5 in | Wt 389.0 lb

## 2012-10-27 DIAGNOSIS — E119 Type 2 diabetes mellitus without complications: Secondary | ICD-10-CM | POA: Insufficient documentation

## 2012-10-27 NOTE — Progress Notes (Signed)
Subjective:  Patient presents today for 3 month diabetes follow-up as part of the employer-sponsored [employer] to [employer] program. He is not taking any medications to treat diabetes at this time. Patient also continues on daily ASA, ACEi, and statin.   He last saw Dr. Zachery Dauer in September. He reports that his A1C was 6.4%. He wanted to be put on metformin but Dr. Zachery Dauer wanted to wait until another A1C check in March to decide whether to start metformin. The patient wants to start metformin as he thinks that it will help with weight loss. He apparently had a brother in law that started metformin and lost 75 pounds. I explained that a 75 pound weight loss was not typical and he couldn't expect those kind of results with him.   He met with the dietitian over the summer and he states that it was not helpful for him. He didn't gain any new information. He is not interested in bariatric surgery at this point.  Patient is here for an appointment along with his wife, who is also in the [employer] to [employer]. I conducted their visit jointly.    Disease Assessments:  Diabetes:  Year of diagnosis 2014; Sees Diabetes provider 2 times per year; Diabetes Education [facility] intense class; MD managing Diabetes NA; takes an aspirin a day; Type of Diabetes: Type 2; uses glucometer  Will provide new True Result meter; checks blood glucose once daily;   7 day CBG average 131; 14 day CBG average 128; 30 day CBG average 127; hypoglycemia frequency never.; Highest CBG 171; Lowest CBG 86;   Other Diabetes History:  He is checking his blood sugar now once or twice daily. Before he was checking 4-5 times daily. He was keeping a food log at one point and then stopped. Today he started logging his food again. Most of the elevated CBG levels are 2 hour post prandial checks.   He denies hypoglycemia.   The majority of his blood sugars are within 100-145. The elevated blood sugars are usually a 2 hour post prandial readings.    Nutrition-  B- he states that he is eating breakfast 3 times a week. When he does eat breakfast he eats 2 cans of V8 juice (12 oz cans). Sometimes he gets a cheese omelette from the cafeteria. Occasionally he gets it with bacon, tomatoes and onions. He states he skips breakfast because his work is busy (he starts work at 4 AM) and that he doesn't like having a full stomach to work. 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   L- [place] buffet a few times a week, [address], [place], [place]. He goes with his wife. At the buffet he gets egg drop soup, fried rice, crab rangoons, crispy chicken or sweet and sour chicken. At [facility] he gets rotissiere chicken, mashed potatoes, broccoli and cheese soup, salad. Every 3rd trip to [facility] he gets a yeast roll. He states he doesn't like bread very much.   D- Grilled cheese sandwich and bowl of soup, cheeseburger hot pocket, often times he is eating soup.   Patient states that he really doesn't feel he eats a lot. However, he kept a food log for a few days over the past few weeks and his portion sizes are quite large. Today, for example, he had 4 boiled eggs, 3 pieces of [place] sausage and regular ginger ale at 9:30 and then at 11:30 he ate 2 cans of  spaghettios and a regular ginger ale. I pointed this out to the patient and we looked up the nutrition information for spaghettios. Patient seemed surprised to know that each can of spaghettios was 340 kcal.   Physical Activity-  Nothing outside of work. 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 He has tried using the exercise. He states that it is hard for him to work with the arm movement. 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      Vital Signs:  10/27/2012 3:28 PM (EST) Blood Pressure 156 / 82 mm/HgBMI 56.6; Height 5 ft 9.5 in; Pulse Rate 78 bpm; Weight 389 lbs    Testing:  Blood Sugar Tests:   Hemoglobin A1c: 6.4 resulted on 10/03/2012    Assessment/Plan: Patient is a 57 year old male with DM2. A1C is currently at goal of less than 7%. Last A1C in September with Dr. Zachery Dauer was 6.4%. The biggest issue facing him right now is his weight. He weighs 389 pounds and has a BMI of 56.6. We spent a large part of the visit today discussing what his goals are with his weight. He states that he would like to lose weight. I encouraged him to think about what his motivations are to lose weight and to write those down. I also asked him to think about how much effort and work he was willing to put forth in order to reach those goals.   24 hour recall shows that he is eating out at restaraunts and fast foods almost every day of the week. He is also eating large portion sizes. I discussed with him cutting back portion sizes. He is also not eating breakfast. I think this is leading to overeating at lunch. Patient states that he is not hungry in the morning. I encouraged him to eat something in the morning, even if it was small. He agreed to start eating a small bowl of cereal or a portion of cottage cheese with mandarin oranges. Follow up with him at next visit and further discuss carb counting along with overall portion size management.   He is not exercising outside of work. 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 I pointed out to him that his weight hasn't changed with the amount of physical activity he is exerting now, so in order to achieve weight loss he will need to add additional physical activity during the day. He is worried about being tired in the evenings. I explained that physical activity could aid with energy even in the evenings when he is tired. Patient agreed to start using the exercise bike at home for 5 minutes 3 days a week. I offered to try and get him into cardiac rehab again or into the BELT program at [facility] and  patient declined.    Patient was previously taking several supplements, including fish oil, earlier in the summer, but stopped taking all of them when he had a UTI. I suggested that given his hyperlipidemia it may be beneficial to restart that.   Follow up with patient in 3 months.   Goals for Next Visit-  1. Think about why you want to lose weight. Think about your motivations to lose weight and think about how much effort and work you are willing to put forth in order to meet your goals.  2. Write down the reasons that  you want to lose weight and the benefits you will gain from being at a lower weight.  3. Try to eat breakfast at least on the days that you are working. Ideas- cottage cheese with oranges; 1/2 cup cereal with milk.  4. Physical activity- try to ride the exercise bike for 5 minutes three days a week.   Next appointment to see me is Friday January 23rd at 2:30 PM.

## 2012-12-08 ENCOUNTER — Other Ambulatory Visit: Payer: Self-pay | Admitting: *Deleted

## 2012-12-08 DIAGNOSIS — I2510 Atherosclerotic heart disease of native coronary artery without angina pectoris: Secondary | ICD-10-CM

## 2012-12-08 DIAGNOSIS — Z79899 Other long term (current) drug therapy: Secondary | ICD-10-CM

## 2012-12-23 ENCOUNTER — Encounter: Payer: Self-pay | Admitting: Cardiology

## 2012-12-29 ENCOUNTER — Telehealth: Payer: Self-pay | Admitting: Cardiology

## 2012-12-29 NOTE — Telephone Encounter (Signed)
New Problem:  Pt states on 12/9 at [facility] he had blood work drawn. Pt is asking if he still needs to come to his lab appt this week at our office. Pt is requesting a call back.

## 2012-12-29 NOTE — Telephone Encounter (Signed)
Spoke to pt and cancelled lab appt. Printed lab results and gave to Dr Mayford Knife to review

## 2012-12-31 ENCOUNTER — Other Ambulatory Visit: Payer: Self-pay

## 2013-02-09 ENCOUNTER — Ambulatory Visit (INDEPENDENT_AMBULATORY_CARE_PROVIDER_SITE_OTHER): Payer: Self-pay | Admitting: Family Medicine

## 2013-02-09 VITALS — BP 130/70 | Ht 69.5 in | Wt 389.0 lb

## 2013-02-09 DIAGNOSIS — E119 Type 2 diabetes mellitus without complications: Secondary | ICD-10-CM

## 2013-02-09 NOTE — Progress Notes (Signed)
Subjective:  Patient presents today for 3 month diabetes follow-up as part of the employer-sponsored Link to Wellness program. Patient is not currently taking any medications for diabetes. Patient also continues on daily ASA, ACEi, and statin.   Patient saw Dr. Drema Dallas in December. He reports that his A1C was 6.5%. Started taking Magnesium supplement for cramps. He states that this has helps with cramps. He reports no other changes with his health.   Patient was selected as employee of the month a few months ago and was also selected for employee of the year.      Disease Assessments:  Diabetes:  Year of diagnosis 2014; Sees Diabetes provider 2 times per year; Diabetes Education [facility] intense class; MD managing Diabetes NA; takes an aspirin a day; Type of Diabetes: Type 2; uses glucometer Using wife's old Meter. Will provide new True Result meter; checks blood glucose once daily;   Highest CBG 176; Lowest CBG 89; hypoglycemia frequency never.;   7 day CBG average 125; 14 day CBG average 134; 30 day CBG average 135; Other Diabetes History: He is checking blood sugar once daily fasting in the morning.     Nutrition-  B- he estimates that he is eating breakfast four days a week. Sometimes when he gets off at 11 AM he just waits and eats a bigger dinner. He thinks that most of the days that he works he is eating something. When he is eating breakfast he will occasionally eat bacon, sometimes home fries, usually scrambled eggs. Also drinking sprite zero, sweet tea (he thinks he is drinking this once a week).   L- eating out for lunch. [place] buffet- usually getting black pepper chicken, 3 crab rangoons, 1/2 scoop or rice or rice noodles, egg drop soup.   D- Sometimes not eating dinner. Last night- cheese sandwich. beef stick with sweet and sour sauce. Small piece of cheese log with crackers. Sometimes a burrito.   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   He has been walking extra at work and he is walking around the floors in addition to his regular working shift and schedule. He is doing this 2-3 times a week. This is an improvement since his last visit with me when he wasn't completing any physical activity outside of work.     Preventive Care:  Last Complete Physical: 12/24/2011  Hemoglobin A1c: 12/26/2012  Blood Glucose 06/18/2012 fasting Last Blood Glucose Value: 117    Colonoscopy: 03/24/2007  Dilated Eye Exam: 05/15/2013  Flu vaccine: 10/14/2012  Lipid Panel: 01/14/2012  Other Preventive Care Notes: Dental- September 2014       Overall Health Assessments:  Vision:  Dilated Eye Exam: 05/15/2013      Vital Signs:  02/09/2013 4:06 PM (EST) Blood Pressure 130 / 70 mm/HgBMI 56.6; Height 5 ft 9.5 in; Weight 389 lbs    Testing:  Blood Sugar Tests:  Hemoglobin A1c: 6.5 via dr. Drema Dallas office resulted on 12/26/2012    Assessment/Plan: Patient is a 58 year old male with DM2. Most recent A1C was 6.5% and is meeting goal of less than 7%. At his last appointment with me I asked patient to ask Dr. Drema Dallas about starting metformin. She declined and patient is only treating DM with lifestyle modifications.   Patient's weight continues to be his biggest barrier. I spent most of the last visit discussing with patient what he wanted to do about his weight. One of his goals was  to think about what he could do to lose weight. Patient has started additional exercise and physical activity in addition to his daily work activities. He is now walking the hallways of [facility] a few days a week for about 10 minutes. He states that sometimes he has to sit halway through to catch his breath. I congratulated patient on starting exercise and encouraged him to continue walking. He reports that since he has started he has noticed an improvement in how long he can walk.   We spent a lof of the visit today discussing eating schedules and habits. Patient  is not eating very much during the day and is frequently skipping meals. We discussed today trying to eat three meals during the day, even if one of the meals is very small or a snack. He most often skips breakfast, but sometimes skips dinner also. Patient made the goal to eat three meals each day.   Patient will follow up with Caryl Pina in 3 months.   Goals for Next Visit-  1. Every day that you work, walk an extra 10 minutes around the floors of the hospital.  2. Aim to eat three meals a day, even if one or two of the meals are really just a snack.   Next visit is with Caryl Pina on Thursday May 7th at 2:30 PM.

## 2013-02-19 NOTE — Progress Notes (Signed)
Patient ID: Travis [name], male   DOB: Sep 19, 1955, 58 y.o.   MRN: [id number] ATTENDING PHYSICIAN NOTE: I have reviewed the chart and agree with the plan as detailed above. Dorcas Mcmurray MD Pager [phone]

## 2013-03-01 ENCOUNTER — Encounter: Payer: Self-pay | Admitting: *Deleted

## 2013-04-23 ENCOUNTER — Encounter: Payer: Self-pay | Admitting: Cardiology

## 2013-04-23 ENCOUNTER — Ambulatory Visit (INDEPENDENT_AMBULATORY_CARE_PROVIDER_SITE_OTHER): Payer: 59 | Admitting: Cardiology

## 2013-04-23 VITALS — BP 163/87 | HR 82 | Ht 69.5 in | Wt 395.0 lb

## 2013-04-23 DIAGNOSIS — I2510 Atherosclerotic heart disease of native coronary artery without angina pectoris: Secondary | ICD-10-CM

## 2013-04-23 DIAGNOSIS — E7800 Pure hypercholesterolemia, unspecified: Secondary | ICD-10-CM

## 2013-04-23 DIAGNOSIS — I493 Ventricular premature depolarization: Secondary | ICD-10-CM

## 2013-04-23 DIAGNOSIS — I10 Essential (primary) hypertension: Secondary | ICD-10-CM | POA: Insufficient documentation

## 2013-04-23 DIAGNOSIS — I4949 Other premature depolarization: Secondary | ICD-10-CM

## 2013-04-23 DIAGNOSIS — R0789 Other chest pain: Secondary | ICD-10-CM | POA: Insufficient documentation

## 2013-04-23 MED ORDER — ASPIRIN 81 MG PO TABS: 81.0000 mg | ORAL_TABLET | Freq: Every day | ORAL | Status: DC

## 2013-04-23 NOTE — Patient Instructions (Signed)
Your physician has recommended you make the following change in your medication: 1. Decrease Aspirin 81 MG 1 tablet daily  Your physician has recommended that you wear a holter monitor. Holter monitors are medical devices that record the heart's electrical activity. Doctors most often use these monitors to diagnose arrhythmias. Arrhythmias are problems with the speed or rhythm of the heartbeat. The monitor is a small, portable device. You can wear one while you do your normal daily activities. This is usually used to diagnose what is causing palpitations/syncope (passing out).  Your physician has requested that you have a lexiscan myoview. For further information please visit [email or web address]. Please follow instruction sheet, as given.  You have been referred to the [facility] with [facility]. Please schedule appointment before you leave today at check out.  Your physician wants you to follow-up in: 1 year with Dr Mallie Snooks will receive a reminder letter in the mail two months in advance. If you don't receive a letter, please call our office to schedule the follow-up appointment.

## 2013-04-23 NOTE — Progress Notes (Signed)
[place], [place], [place]  [address] Phone: [phone] Fax:  [phone]  Date:  04/23/2013   ID:  Travis [name], DOB 10/06/1955, MRN [id number]  PCP:  [name], MD  Cardiologist:  Fransico Him ,MD     History of Present Illness: Travis [name] is a 58 y.o. male with a history of ASCAD with PCI 1997, PVC's, dyslipidemia, HTN who presents today for followup.  He is doing well  He denies any SOB, DOE, dizziness, palpitations or syncope.  He rarely will have an episode of sharp pain to the left of sternum that feels like a bee sting and he has had it since his heart cath and has not changed any in years and is very rare.  He occasionally has some mild LE edema.     Wt Readings from Last 3 Encounters:  04/23/13 395 lb (179.171 kg)  02/09/13 389 lb (176.449 kg)  10/27/12 389 lb (176.449 kg)     Past Medical History  Diagnosis Date  . Cancer   . Frequent unifocal PVCs   . Coronary artery disease 1997  . Hypertension   . High cholesterol     Current Outpatient Prescriptions  Medication Sig Dispense Refill  . amLODipine (NORVASC) 10 MG tablet Take 10 mg by mouth daily.      [name] aspirin 325 MG tablet Take 325 mg by mouth daily.      [name] atorvastatin (LIPITOR) 80 MG tablet Take 80 mg by mouth daily.      [name] ezetimibe (ZETIA) 10 MG tablet Take 10 mg by mouth daily.      . fenofibrate micronized (LOFIBRA) 200 MG capsule Take 200 mg by mouth once.      . furosemide (LASIX) 20 MG tablet Take 20 mg by mouth 2 (two) times daily.      [name] lisinopril (PRINIVIL,ZESTRIL) 40 MG tablet Take 40 mg by mouth daily.      . Magnesium-Zinc 133.33-5 MG TABS Take 1 tablet by mouth daily.      . metoprolol succinate (TOPROL-XL) 50 MG 24 hr tablet Take 50 mg by mouth daily. Take with or immediately following a meal.      . Multiple Vitamins-Minerals (MULTIVITAMIN WITH MINERALS) tablet Take 1 tablet by mouth daily.       No current facility-administered medications for this visit.     Allergies:    Allergies  Allergen Reactions  . Crestor [Rosuvastatin]     Muscle Pain     Social History:  The patient  reports that he has never smoked. He does not have any smokeless tobacco history on file. He reports that he does not drink alcohol or use illicit drugs.   Family History:  The patient's family history includes CVA in his father.   ROS:  Please see the history of present illness.      All other systems reviewed and negative.   PHYSICAL EXAM: VS:  BP 163/87  Pulse 82  Ht 5' 9.5" (1.765 m)  Wt 395 lb (179.171 kg)  BMI 57.51 kg/m2 Well nourished, well developed, in no acute distress HEENT: normal Neck: no JVD Cardiac:  normal S1, S2; RRR; no murmur Lungs:  clear to auscultation bilaterally, no wheezing, rhonchi or rales Abd: soft, nontender, no hepatomegaly Ext: no edema Skin: warm and dry Neuro:  CNs 2-12 intact, no focal abnormalities noted  EKG:  NSR with bigemnial PVC's and T wave inversions in inferior leads unchanged from 04/24/12  ASSESSMENT AND PLAN:  1. ASCAD with no angina but some atypical CP that he has had intermittently since his cath in 1997 - decrease ASA to 81mg  daily 2. HTN well controlled - continue amlodipine/Lisinopril/metoprolol 3. Dyslipidemia - last LDL in 12/14 36 but HDL still too low.  LDL-P still elevated as well - refer to lipid clinic - continue Zetia/atorvastatin/fenofibrate 4. Bigeminal PVC's - he had normal LVF on echo 1 year ago and no ischemia on nuclear stress test in 2012.   - check a 2 day Lexiscan myoview to rule out ischemia induced PVC's - check 24 Hour Holter to assess PVC load - continue beta blocker  Followup with me in 1 year  Signed, Fransico Him, MD 04/23/2013 1:54 PM

## 2013-04-30 ENCOUNTER — Telehealth: Payer: Self-pay | Admitting: Pharmacist

## 2013-04-30 ENCOUNTER — Ambulatory Visit: Payer: Self-pay | Admitting: Pharmacist

## 2013-04-30 NOTE — Telephone Encounter (Signed)
Patient has a h/o CAD, Diabetes, and HTN with LDL-P number goal < 1000.  Dr. [name] wanted to me to discuss possible options to further lower his LDL-P number.  He weighs ~ 400 lbs but actually doesn't appear to over eat.  Lunch is his largest meal of the day.  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  He takes all his lipid lowering meds at night, but typically doesn't eat much at this time of day.  Intolerant to Crestor.  A1C is good at 6.5  Labs:  12/2012:  LDL-P number 1110, TC 89, LDL 36, HDL 27, TG 130 (Lipitor 80 mg qd, fenofibrate 160 mg qd, Zetia 10 mg qd, fish oil)  Plan: 1.  Patient notified to start taking his fenofibrate with his lunch as this is his largest meal.  This will help with better bioavailability of the medication, and hopefully further LDL-P number lowering. 2.  No other changes in meds as can't tolerate Crestor, is on max dose of lipitor, and rest of lipid panel looks fine. Patient agreeable to this, and will get blood work in future with PCP as normal

## 2013-05-06 ENCOUNTER — Encounter (INDEPENDENT_AMBULATORY_CARE_PROVIDER_SITE_OTHER): Payer: 59

## 2013-05-06 ENCOUNTER — Encounter: Payer: Self-pay | Admitting: *Deleted

## 2013-05-06 DIAGNOSIS — I4949 Other premature depolarization: Secondary | ICD-10-CM

## 2013-05-06 DIAGNOSIS — I493 Ventricular premature depolarization: Secondary | ICD-10-CM

## 2013-05-06 NOTE — Progress Notes (Signed)
Patient ID: Travis [name], male   DOB: 11-09-1955, 58 y.o.   MRN: [id number] E-Cardio 24 hour holter monitor applied to patient.

## 2013-05-07 NOTE — Progress Notes (Signed)
Patient ID: Travis [name], male   DOB: 22-Mar-1955, 58 y.o.   MRN: [id number] ATTENDING PHYSICIAN NOTE: I have reviewed the chart and agree with the plan as detailed above. Dorcas Mcmurray MD Pager [phone]

## 2013-05-12 ENCOUNTER — Encounter: Payer: Self-pay | Admitting: Cardiology

## 2013-05-12 ENCOUNTER — Telehealth: Payer: Self-pay | Admitting: Cardiology

## 2013-05-12 NOTE — Telephone Encounter (Signed)
Please let patient know that heart monitor showed NSR and Sinus brady with HR avg from 50-97bpm with no arrhythmias

## 2013-05-12 NOTE — Telephone Encounter (Signed)
Error disregard last statement Below message and Dr [name] will send corrected documentation

## 2013-05-12 NOTE — Telephone Encounter (Signed)
This encounter was created in error - please disregard.

## 2013-05-14 ENCOUNTER — Ambulatory Visit (HOSPITAL_COMMUNITY): Payer: 59 | Attending: Cardiology | Admitting: Radiology

## 2013-05-14 ENCOUNTER — Encounter: Payer: Self-pay | Admitting: Cardiology

## 2013-05-14 VITALS — BP 132/86 | HR 68 | Ht 70.0 in | Wt 396.0 lb

## 2013-05-14 DIAGNOSIS — R0602 Shortness of breath: Secondary | ICD-10-CM

## 2013-05-14 DIAGNOSIS — R079 Chest pain, unspecified: Secondary | ICD-10-CM

## 2013-05-14 DIAGNOSIS — I2510 Atherosclerotic heart disease of native coronary artery without angina pectoris: Secondary | ICD-10-CM

## 2013-05-14 MED ORDER — REGADENOSON 0.4 MG/5ML IV SOLN
0.4000 mg | Freq: Once | INTRAVENOUS | Status: AC
  Administered 2013-05-14: 0.4 mg via INTRAVENOUS

## 2013-05-14 MED ORDER — TECHNETIUM TC 99M SESTAMIBI GENERIC - CARDIOLITE
30.0000 | Freq: Once | INTRAVENOUS | Status: AC | PRN
  Administered 2013-05-14: 30 via INTRAVENOUS

## 2013-05-14 NOTE — Progress Notes (Signed)
Travis [name],  Travis [name] is a 58 y.o. male     MRN : [id number]     DOB: 1955-03-29  Procedure Date: 05/14/2013  Nuclear Med Background Indication for Stress Test:  Evaluation for Ischemia and PTCA Patency History: Cath, CAD, MI, PTCA, 2012 Myocardial Perfusion Imaging-normal, EF=50%, and 2014 Echo: EF=55-60% Cardiac Risk Factors: Family History - CAD, Hypertension and Lipids  Symptoms: Chest Pain with/without exertion (last occurrence 2 weeks ago), Dizziness, DOE and Palpitations   Nuclear Pre-Procedure Caffeine/Decaff Intake:  None > 12 hrs NPO After: 7:00pm   Lungs:  clear O2 Sat: 97% on room air. IV 0.9% NS with Angio Cath:  22g  IV Site: R Antecubital x 1, tolerated well IV Started by:  Irven Baltimore, RN  Chest Size (in):  60 Cup Size: n/a  Height: 5\' 10"  (1.778 m)  Weight:  396 lb (179.624 kg)  BMI:  Body mass index is 56.82 kg/(m^2). Tech Comments:  Patient took Lisinopril this am, and Toprol last night. Irven Baltimore, RN.    Nuclear Med Study 1 or 2 day study: 2 day  Stress Test Type:  Lexiscan  Reading MD: N/A  Order Authorizing Provider:  Fransico Him, MD  Resting Radionuclide: Technetium 21m Sestamibi  Resting Radionuclide Dose: 33.0 mCi  05/18/13  Stress Radionuclide:  Technetium 42m Sestamibi  Stress Radionuclide Dose: 33.0 mCi  05/14/13          Stress Protocol Rest HR: 68 Stress HR: 96  Rest BP: 132/86 Stress BP: 156/78  Exercise Time (min): n/a METS: n/a   Predicted Max HR: 164 bpm % Max HR: 58.54 bpm Rate Pressure Product: 14976   Dose of Adenosine (mg):  n/a Dose of Lexiscan: 0.4 mg  Dose of Atropine (mg): n/a Dose of Dobutamine: n/a mcg/kg/min (at max HR)  Stress Test Technologist: Irven Baltimore, RN  Nuclear Technologist:  Charlton Amor, CNMT     Rest Procedure:  Myocardial perfusion imaging was performed at rest 45 minutes  following the intravenous administration of Technetium 59m Sestamibi. Rest ECG: NSR with non-specific ST-T wave changes  Stress Procedure:  The patient received IV Lexiscan 0.4 mg over 15-seconds.  Technetium 40m Sestamibi injected at 30-seconds. The patient complained of stomach soreness, headache, legs tingling, but denied chest pain with Lexiscan.  Quantitative spect images were obtained after a 45 minute delay. Stress ECG: No significant change from baseline ECG  QPS Raw Data Images:  Normal; no motion artifact; normal heart/lung ratio. Stress Images:  There is decreased uptake in the inferolateral wall. Rest Images:  There is decreased uptake in the inferolateral wall. Subtraction (SDS):  There is a fixed defect that is most consistent with a previous infarction with partial reversibility. Transient Ischemic Dilatation (Normal <1.22):  1.11 Lung/Heart Ratio (Normal <0.45):  0.26  Quantitative Gated Spect Images QGS EDV:  196 ml QGS ESV:  81 ml  Impression Exercise Capacity:  Lexiscan with no exercise. BP Response:  Normal blood pressure response. Clinical Symptoms:  No chest pain. ECG Impression:  No significant ST segment change suggestive of ischemia. Comparison with Prior Nuclear Study: Previous images not available. Report describes  inferolateral defect.  Overall Impression:  Low risk stress nuclear study. There is a medium size defect of moderate severity of the inferolateral wall consistent with old scar.  There is partial reversibility.  LV Ejection Fraction: 59%.  LV  Wall Motion:  NL LV Function; NL Wall Motion  Darlin Coco  MD

## 2013-05-14 NOTE — Telephone Encounter (Signed)
Please refer to EP for evaluation since PVC load is so high.  In the interim increase Toprol XL to 75mg  daily to suppress PVC's and nonsustained atrial tachycardia and followup with Richardson Dopp or Truitt Merle in 1-2 weeks

## 2013-05-14 NOTE — Telephone Encounter (Signed)
This encounter was created in error - please disregard.

## 2013-05-14 NOTE — Telephone Encounter (Signed)
Please let patient know that heart monitor showed NSR with average HR 76bpm, sinus tachycardia up to 113bpm, nonsustained atrial tachycardia up to 24 beats, occasional PVC's and bigeminal PVC's and ventricular couplets.  PVC load is 37%.

## 2013-05-15 ENCOUNTER — Telehealth: Payer: Self-pay | Admitting: Cardiology

## 2013-05-15 ENCOUNTER — Other Ambulatory Visit: Payer: Self-pay | Admitting: General Surgery

## 2013-05-15 MED ORDER — METOPROLOL SUCCINATE ER 25 MG PO TB24: 25.0000 mg | ORAL_TABLET | Freq: Every day | ORAL | Status: DC

## 2013-05-15 MED ORDER — EZETIMIBE 10 MG PO TABS: 10.0000 mg | ORAL_TABLET | Freq: Every day | ORAL | Status: DC

## 2013-05-15 NOTE — Telephone Encounter (Signed)
New problem ° ° °Pt returning your call. °

## 2013-05-15 NOTE — Telephone Encounter (Signed)
Pt is aware. New Rx sent in for pt, Also pt requested Zetia be refilled. Set up for first available apt with NP. Sent referral to EP to front desk to schedule for pt.

## 2013-05-15 NOTE — Telephone Encounter (Signed)
Lvm for pt to return call.

## 2013-05-15 NOTE — Telephone Encounter (Signed)
Spoke with pt

## 2013-05-18 ENCOUNTER — Ambulatory Visit (HOSPITAL_COMMUNITY): Payer: 59 | Attending: Cardiovascular Disease

## 2013-05-18 DIAGNOSIS — R079 Chest pain, unspecified: Secondary | ICD-10-CM

## 2013-05-18 DIAGNOSIS — R0989 Other specified symptoms and signs involving the circulatory and respiratory systems: Secondary | ICD-10-CM

## 2013-05-18 MED ORDER — TECHNETIUM TC 99M SESTAMIBI GENERIC - CARDIOLITE
33.0000 | Freq: Once | INTRAVENOUS | Status: AC | PRN
  Administered 2013-05-18: 33 via INTRAVENOUS

## 2013-05-22 ENCOUNTER — Encounter: Payer: Self-pay | Admitting: Internal Medicine

## 2013-05-22 ENCOUNTER — Ambulatory Visit (INDEPENDENT_AMBULATORY_CARE_PROVIDER_SITE_OTHER): Payer: 59 | Admitting: Internal Medicine

## 2013-05-22 VITALS — BP 146/77 | HR 78 | Ht 69.5 in | Wt 398.1 lb

## 2013-05-22 DIAGNOSIS — I4949 Other premature depolarization: Secondary | ICD-10-CM

## 2013-05-22 DIAGNOSIS — I493 Ventricular premature depolarization: Secondary | ICD-10-CM

## 2013-05-22 DIAGNOSIS — I10 Essential (primary) hypertension: Secondary | ICD-10-CM

## 2013-05-22 MED ORDER — METOPROLOL SUCCINATE ER 100 MG PO TB24
100.0000 mg | ORAL_TABLET | Freq: Every day | ORAL | Status: DC
Start: 2013-05-22 — End: 2014-06-15

## 2013-05-22 NOTE — Patient Instructions (Signed)
Your physician recommends that you schedule a follow-up appointment in: AS NEEDED  INCREASE METOPROLOL TO 100 MG ONCE DAILY= TWO 50 MG TABLETS ONCE DAILY

## 2013-06-02 NOTE — Progress Notes (Signed)
Primary Care Physician: [name], MD Referring Physician: [name] is a 58 y.o. male with a h/o premature ventricular contractions who presents for EP consultation.  The patient has had documented PVCs for several years.  His EF remains preserved.  He is unaware of his PVCs.  He feels that his exercise tolerance is adequate.  He denies fatigue.  Today, he denies symptoms of palpitations, chest pain, shortness of breath, orthopnea, PND, lower extremity edema, dizziness, presyncope, syncope, or neurologic sequela. The patient is tolerating medications without difficulties and is otherwise without complaint today.   Past Medical History  Diagnosis Date  . Prostate cancer 2012  . Frequent unifocal PVCs   . Coronary artery disease 1997  . Essential hypertension, benign   . High cholesterol   . Dyslipidemia   . Morbid obesity   . History of MI (myocardial infarction) 1997    Angioplasty  . CAD (coronary artery disease)   . Coronary atherosclerosis of native coronary artery   . Diverticulosis of colon with hemorrhage 2009  . Blood in stool   . Myalgia and myositis, unspecified   . Sleep apnea with use of continuous positive airway pressure (CPAP)   . Edema   . Family history of thyroid disease   . Encounter for long-term (current) use of other medications   . Type II or unspecified type diabetes mellitus without mention of complication, not stated as uncontrolled   . Appendicitis    Past Surgical History  Procedure Laterality Date  . Appendectomy    . Angioplasty  1997    Dr. Glade Lloyd    Current Outpatient Prescriptions  Medication Sig Dispense Refill  . amLODipine (NORVASC) 10 MG tablet Take 10 mg by mouth daily.      [name] aspirin 81 MG tablet Take 1 tablet (81 mg total) by mouth daily.      [name] atorvastatin (LIPITOR) 80 MG tablet Take 80 mg by mouth daily.      [name] ezetimibe (ZETIA) 10 MG tablet Take 1 tablet (10 mg total) by mouth daily.  90  tablet  3  . fenofibrate micronized (LOFIBRA) 200 MG capsule Take 200 mg by mouth once.      . furosemide (LASIX) 20 MG tablet Take 20 mg by mouth 2 (two) times daily.      [name] lisinopril (PRINIVIL,ZESTRIL) 40 MG tablet Take 40 mg by mouth daily.      . Magnesium-Zinc 133.33-5 MG TABS Take 1 tablet by mouth daily.      . metoprolol succinate (TOPROL-XL) 100 MG 24 hr tablet Take 1 tablet (100 mg total) by mouth daily. Take with or immediately following a meal.  30 tablet  12  . Multiple Vitamins-Minerals (MULTIVITAMIN WITH MINERALS) tablet Take 1 tablet by mouth daily.       No current facility-administered medications for this visit.    Allergies  Allergen Reactions  . Crestor [Rosuvastatin]     Muscle Pain     History   Social History  . Marital Status: Married    Spouse Name: N/A    Number of Children: N/A  . Years of Education: N/A   Occupational History  . Not on file.   Social History Main Topics  . Smoking status: Never Smoker   . Smokeless tobacco: Not on file  . Alcohol Use: No  . Drug Use: No  . Sexual Activity: No   Other Topics Concern  . Not on file   Social  History Narrative  . No narrative on file    Family History  Problem Relation Age of Onset  . CVA Father   . Hypertension Father   . Diabetes Mother     DM  . Hypothyroidism Sister   . Hypertension Sister     ROS- All systems are reviewed and negative except as per the HPI above  Physical Exam: Filed Vitals:   05/22/13 1524  BP: 146/77  Pulse: 78  Height: 5' 9.5" (1.765 m)  Weight: 398 lb 1.9 oz (180.586 kg)    GEN- The patient is morbid obese appearing, alert and oriented x 3 today.   Head- normocephalic, atraumatic Eyes-  Sclera clear, conjunctiva pink Ears- hearing intact Oropharynx- clear Neck- supple  Lungs- Clear to ausculation bilaterally, normal work of breathing Heart- Regularly irregular with frequent PVCs,  GI- soft, NT, ND, + BS Extremities- no clubbing, cyanosis, +  edema with venous stasis changes MS- no significant deformity or atrophy Skin- no rash or lesion Psych- euthymic mood, full affect Neuro- strength and sensation are intact  Epic records are reviewed including ekgs, echo, and Dr Landis Gandy notes  Assessment and Plan:  1. PVCs The patient has frequent PVCs which are not closely coupled to the preceding T wave.  He appears to be mostly asymptomatic presently.  His EF is preserved.  I will increase his metoprolol as he has elevated BP at this time. Given his morbid obesity, he is not a candidate for ablation.  He will follow-up with Dr [name] and I will see as needed going forward.

## 2013-06-04 ENCOUNTER — Ambulatory Visit (INDEPENDENT_AMBULATORY_CARE_PROVIDER_SITE_OTHER): Payer: 59 | Admitting: Nurse Practitioner

## 2013-06-04 ENCOUNTER — Encounter: Payer: Self-pay | Admitting: Nurse Practitioner

## 2013-06-04 ENCOUNTER — Ambulatory Visit (INDEPENDENT_AMBULATORY_CARE_PROVIDER_SITE_OTHER): Payer: Self-pay | Admitting: Family Medicine

## 2013-06-04 ENCOUNTER — Encounter (INDEPENDENT_AMBULATORY_CARE_PROVIDER_SITE_OTHER): Payer: Self-pay

## 2013-06-04 VITALS — BP 100/60 | HR 73 | Wt >= 6400 oz

## 2013-06-04 VITALS — BP 100/60 | HR 73 | Ht 69.5 in | Wt >= 6400 oz

## 2013-06-04 DIAGNOSIS — E119 Type 2 diabetes mellitus without complications: Secondary | ICD-10-CM

## 2013-06-04 DIAGNOSIS — R0789 Other chest pain: Secondary | ICD-10-CM

## 2013-06-04 DIAGNOSIS — I2510 Atherosclerotic heart disease of native coronary artery without angina pectoris: Secondary | ICD-10-CM

## 2013-06-04 DIAGNOSIS — I493 Ventricular premature depolarization: Secondary | ICD-10-CM

## 2013-06-04 DIAGNOSIS — I4949 Other premature depolarization: Secondary | ICD-10-CM

## 2013-06-04 LAB — BASIC METABOLIC PANEL
BUN: 18 mg/dL (ref 6–23)
CO2: 29 mEq/L (ref 19–32)
Calcium: 8.9 mg/dL (ref 8.4–10.5)
Chloride: 105 mEq/L (ref 96–112)
Creatinine, Ser: 0.7 mg/dL (ref 0.4–1.5)
GFR: 127.75 mL/min (ref >60.00)
Glucose, Bld: 151 mg/dL — ABNORMAL HIGH (ref 70–99)
Potassium: 4.2 mEq/L (ref 3.5–5.1)
Sodium: 140 mEq/L (ref 135–145)

## 2013-06-04 LAB — TSH: TSH: 0.78 u[IU]/mL (ref 0.35–4.50)

## 2013-06-04 NOTE — Progress Notes (Signed)
Patient presents today for 3 month pre-diabetes follow-up as part of the employer-sponsored [employer] to [employer] program. Diabetes is not currently being treated due to at goal A1c. Patient also continues on daily ASA, ACEi, and statin. Most recent MD follow-up was Dec 2015. Patient has a pending appt for June 2015. Saw cardiology office today, for PVCs and metoprolol was increased to 100 mg daily at this time. No med changes or major health changes at this time.  Diabetes Assessment: Type of Diabetes: Type 2; True Result meter; checks blood glucose once daily; 14 day CBG average 176; 30 day CBG average 173; 7 day CBG average 174; Highest CBG 283; checks feet daily; takes medications as prescribed; hypoglycemia frequency Occassional; Lowest CBG 49; A1c 7.1 Other Diabetes History: Patient is not currently being treated for diabetes, but I anticipate MD will want to start treatment soon given increased A1c. Patient did bring meter today and is currently testing 1-2 times per day. Glucose monitoring occurs fasting, when symptomatic, and at least once more throughout the day. Glucose averages on 7d, 14d, and 30d have increased by 40 points since last visit and are now in the [place]. Glucose readings in AM (fasting) are often >200. Daytime readings are much improved at 120-170s. No hypoglycemia. Patient denies signs and symptoms of neuropathy including numbness/tingling/burning and symptoms of foot infection. Patient is due for yearly eye exam and has one scheduled for late this month.  Lifestyle Assessment: Nutrition-  Patient is working toward a more balanced diet but continues to struggle with this due to work schedule. He is attempting to eat three meals per day but often only eats twice. Tries to avoid late night eating and decrease snacking and has increased vegetables. He does admit to enjoying "Sensible Portions" white cheddar puffed corn as a snack.  He is working to decrease portion sizes while eating at  [employer].  Physical Activity- Walking the halls at work for 5-10 minutes daily during lunch break. 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  Assessment: Patient presents today for 3 month follow-up with deteriorated A1c of 7.1. Fasting glucose is currently the pateints biggest struggle and it is not unusual for a fasting reading to be >200. Patient continues to struggle with diet and weight, but is making efforts to manage this area of lifestyle and is hoping to lose weight by doing so. He is unable to exercise much due to shortness of breath, but is walking 5-10 min daily and I am hopeful he will increase as able. Patient will follow-up in 3 months.  Plan: 1) Continue making healthy dietary choices, limit carbs to 60 g per meal and only 15 g in starch 2) Continue to exercise as much as possible 3) Continue testing 4) I will ask Dr. Drema Dallas to talk with you about starting Metformin 5) Follow-up in 3 months with Jinny Blossom, pharmacist on Monday August 24th @ 2:30 pm

## 2013-06-04 NOTE — Patient Instructions (Signed)
We will check labs today  Try to monitor your blood pressure at home  Let us know if you have any dizzy spells or feeling lightheaded  See Dr. [name] back as planned next April  Call the [place] office at [phone] if you have any questions, problems or concerns.

## 2013-06-04 NOTE — Progress Notes (Signed)
Travis [name] Date of Birth: [birth year] Medical Record #[id number]  History of Present Illness: Travis [name] is seen back today for a follow up visit. Seen for Dr. Corey Harold. He has a history of CAD with remote PCI in 1997, PVCs, HLD, HTN, morbid obesity, OSA and chronic PVCs. Sees Dr. Drema Dallas for primary care.   Was here in early April to see Dr. [name]  - having some atypical pain. BP ok. Referred to lipid clinic. Having bigeminal PVCs - got Myoview and Holter. Then referred on to Dr. Rayann Heman who saw earlier this month - increased his metoprolol. It was his impression that "the patient has frequent PVCs which are not closely coupled to the preceding T wave. He appears to be mostly asymptomatic presently. His EF is preserved. I will increase his metoprolol as he has elevated BP at this time.Given his morbid obesity, he is not a candidate for ablation."  Comes back today for follow up. Here alone. Feels great. Says he is doing even better with the increased dose of metoprolol. Not dizzy. Has a BP cuff at home thru the [facility] but was given a regular size cuff - tries to check on lower forearm but not sure how accurate. Gets too much salt. No chest pain. No recent potassium or TSH noted.    Current Outpatient Prescriptions  Medication Sig Dispense Refill  . amLODipine (NORVASC) 10 MG tablet Take 10 mg by mouth daily.      [name] aspirin 81 MG tablet Take 1 tablet (81 mg total) by mouth daily.      [name] atorvastatin (LIPITOR) 80 MG tablet Take 80 mg by mouth daily.      [name] ezetimibe (ZETIA) 10 MG tablet Take 1 tablet (10 mg total) by mouth daily.  90 tablet  3  . fenofibrate micronized (LOFIBRA) 200 MG capsule Take 200 mg by mouth once.      . furosemide (LASIX) 20 MG tablet Take 20 mg by mouth 2 (two) times daily.      [name] lisinopril (PRINIVIL,ZESTRIL) 40 MG tablet Take 40 mg by mouth daily.      . Magnesium-Zinc 133.33-5 MG TABS Take 1 tablet by mouth daily.      . metoprolol succinate (TOPROL-XL) 100 MG 24  hr tablet Take 1 tablet (100 mg total) by mouth daily. Take with or immediately following a meal.  30 tablet  12  . Multiple Vitamins-Minerals (MULTIVITAMIN WITH MINERALS) tablet Take 1 tablet by mouth daily.       No current facility-administered medications for this visit.    Allergies  Allergen Reactions  . Crestor [Rosuvastatin]     Muscle Pain     Past Medical History  Diagnosis Date  . Prostate cancer 2012  . Frequent unifocal PVCs   . Coronary artery disease 1997  . Essential hypertension, benign   . High cholesterol   . Dyslipidemia   . Morbid obesity   . History of MI (myocardial infarction) 1997    Angioplasty  . CAD (coronary artery disease)   . Coronary atherosclerosis of native coronary artery   . Diverticulosis of colon with hemorrhage 2009  . Blood in stool   . Myalgia and myositis, unspecified   . Sleep apnea with use of continuous positive airway pressure (CPAP)   . Edema   . Family history of thyroid disease   . Encounter for long-term (current) use of other medications   . Type II or unspecified type diabetes mellitus without mention of complication,  not stated as uncontrolled   . Appendicitis     Past Surgical History  Procedure Laterality Date  . Appendectomy    . Angioplasty  1997    Dr. Glade Lloyd    History  Smoking status  . Never Smoker   Smokeless tobacco  . Not on file    History  Alcohol Use No    Family History  Problem Relation Age of Onset  . CVA Father   . Hypertension Father   . Diabetes Mother     DM  . Hypothyroidism Sister   . Hypertension Sister     Review of Systems: The review of systems is per the HPI.  All other systems were reviewed and are negative.  Physical Exam: BP 100/60  Pulse 73  Ht 5' 9.5" (1.765 m)  Wt 400 lb 6.4 oz (181.62 kg)  BMI 58.30 kg/m2  SpO2 97% Patient is very pleasant and in no acute distress. He is morbidly obese. Skin is warm and dry. Color is normal.  HEENT is unremarkable.  Normocephalic/atraumatic. PERRL. Sclera are nonicteric. Neck is supple. No masses. No JVD. Lungs are clear. Heart tones too distant. Abdomen is morbidly obese but soft. Extremities are full but without significant edema. Gait and ROM are intact. No gross neurologic deficits noted.  LABORATORY DATA: PENDING  EKG today shows sinus with PVCs  Lab Results  Component Value Date   WBC 8.8 06/16/2010   HGB 13.1 06/16/2010   HCT 39.5 06/16/2010   PLT 219 06/16/2010   GLUCOSE 93 06/16/2010   CHOL  Value: 102        ATP III CLASSIFICATION:  <200     mg/dL   Desirable  200-239  mg/dL   Borderline High  >=240    mg/dL   High 05/03/2007   TRIG 129 05/03/2007   HDL 24* 05/03/2007   LDLCALC  Value: 52        Total Cholesterol/HDL:CHD Risk Coronary Heart Disease Risk Table                     Men   Women  1/2 Average Risk   3.4   3.3 05/03/2007   ALT 32 06/16/2010   AST 31 06/16/2010   NA 137 06/16/2010   K 3.8 06/16/2010   CL 101 06/16/2010   CREATININE 0.79 06/16/2010   BUN 16 06/16/2010   CO2 26 06/16/2010   INR 1.19 06/16/2010    Myoview Impression from 05/2013  Exercise Capacity: Lexiscan with no exercise.  BP Response: Normal blood pressure response.  Clinical Symptoms: No chest pain.  ECG Impression: No significant ST segment change suggestive of ischemia.  Comparison with Prior Nuclear Study: Previous images not available. Report describes inferolateral defect.  Overall Impression: Low risk stress nuclear study. There is a medium size defect of moderate severity of the inferolateral wall consistent with old scar. There is partial reversibility.  LV Ejection Fraction: 59%. LV Wall Motion: NL LV Function; NL Wall Motion  Darlin Coco MD    Echo Study Conclusions from 04/2012  - Left ventricle: The cavity size was normal. There was mild focal basal hypertrophy of the septum. Systolic function was normal. The estimated ejection fraction was in the range of 55% to 60%. Although no diagnostic regional wall motion  abnormality was identified, this possibility cannot be completely excluded on the basis of this study. - Left atrium: The atrium was mildly dilated.    Assessment / Plan: 1. Asymptomatic PVCs -  on beta blocker - EF preserved - he is asymptomatic - actually feeling better on his current regimen.   2. HTN - recheck by me is 110 palpated. I have left him on his current regimen. May need to cut some of his other medicines back if he gets symptomatic hypotension.   3. CAD - no active chest pain.   4. Morbid obesity - this would be the crux of his issues - do not see this changing unfortunately.   See back as planned next April. No change in current regimen.   Patient is agreeable to this plan and will call if any problems develop in the interim.   Burtis Junes, RN, [facility] [address] [place], [place]  [phone]

## 2013-06-29 ENCOUNTER — Telehealth: Payer: Self-pay | Admitting: Family Medicine

## 2013-07-01 ENCOUNTER — Encounter: Payer: Self-pay | Admitting: Cardiology

## 2013-07-08 NOTE — Telephone Encounter (Signed)
Left Message - Called patient to come in for lab work. Please schedule for cholesterol and A1C test as part of pt diabetes care.

## 2013-09-01 ENCOUNTER — Encounter: Payer: Self-pay | Admitting: Family Medicine

## 2013-09-01 NOTE — Progress Notes (Signed)
Patient ID: Travis [name], male   DOB: 1955/11/08, 58 y.o.   MRN: [id number] Reviewed: Agree with the documentation and management of our pharmacologist.

## 2013-09-14 ENCOUNTER — Ambulatory Visit (INDEPENDENT_AMBULATORY_CARE_PROVIDER_SITE_OTHER): Payer: Self-pay | Admitting: Family Medicine

## 2013-09-14 VITALS — BP 120/68 | Ht 69.0 in | Wt 392.0 lb

## 2013-09-14 DIAGNOSIS — E119 Type 2 diabetes mellitus without complications: Secondary | ICD-10-CM

## 2013-09-14 DIAGNOSIS — E1165 Type 2 diabetes mellitus with hyperglycemia: Secondary | ICD-10-CM

## 2013-09-14 NOTE — Progress Notes (Signed)
Subjective:  Patient presents today for 3 month diabetes follow-up as part of the employer-sponsored Link to Wellness program. Current diabetes regimen includes metformin 1000 mg in the AM and 500 mg in the PM. Patient also continues on daily ACEi, and statin. No major health changes at this time. Metformin is a new medication since his last visit with me. He reports that things are well. He had a stress test (chemical) with Dr. [name]. He reports that he had several PVCs. After the test metoprolol increased to 100 mg XL once daily. He saw Dr. Drema Dallas in June and he reports that his A1C was 7.1%. After that visit metformin was started at 500 mg BID. He has titrated to 1000 mg in the morning and 500 mg in the evening.   Disease Assessments:  Diabetes: Year of diagnosis 2014; Sees Diabetes provider 2 times per year; Diabetes Education [facility] intense class; MD managing Diabetes NA; takes an aspirin a day; Type of Diabetes: Type 2; uses glucometer Using wife's old Meter. Will provide new True Result meter; checks feet daily; takes medications as prescribed;   hypoglycemia frequency Occassional; checks blood glucose 2 times a day;   7 day CBG average 164; 14 day CBG average 169; 30 day CBG average 156; Highest CBG 267; Lowest CBG 106; Other Diabetes History:  A1C today is 7.4%. This is about the same that it was in June with Dr. Drema Dallas. He is taking Metformin 1000 mg in the morning and 500 mg in the evening. He is checking 2-6 times daily. He is checking fasting in the morning before he goes to work, he will check it again 2-4 hours later before he eats. He checks again to make sure that his blood sugar is coming back down. CBG averages are about 10 points lower than before. He states that when he checks CBG in the evening before dinner and if it is high he doesn't eat and skips dinner. He states that he doesn't like to eat and feels like it is a Air traffic controller. Denies hypoglycemia. Often pre dinner readings are in the  mid 200s. fasting are anywhere from 100-180s; most are in the 150s.                   Social History:  Denies alcohol use; Diet adherence Less than 25% of the time; Exercise adherence 3-4 days a week; 30 minutes of exercise per week; Medication adherence adherent; Patient can afford medications; Patient knows the purpose/use of medications.  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 He is trying to eat a more balanced meal. He states that he has cut back on bread and potatoes. He is drinking V8 drink once in a while. He still has a busy work schedule. He starts work at [employer] AM and gets off around lunch time. Some days he will get something for breakfast from the cafeteria.  B- sometimes omelette; usually doesn't eat. If he does eat it is eggs and bacon.  L- eating out three days a week (Chinese, McDonald's etc.). Crispy chicken (baked with panko bread crumbs); sometimes crab and cheese; sometimes a spoonful of rice or butter potatoes.  D- Eating at home. Chicken, steak, sometimes crock pot recipes.  Physical Activity- Walking the halls at work for 5-10 minutes daily during lunch break. He was doing this until about a week ago when he injured his knee. He hasn't been walking any extra because  of the pain. 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  We searched for chair exercise videos with his wife. He seemed interested in doing it.                                Vital Signs:  09/07/2013 3:50 PM (EST)Blood Pressure 120 / 68 mm/HgBMI 57.9; Height 5 ft 9 in; Weight 392 lbs   Testing:  Blood Sugar Tests: Hemoglobin A1c: 7.4 via POCT resulted on 09/07/2013       Assessment/Plan: Patient is a 58 year male with DM2. A1C today was 7.4% and is elevated above goal of less than 7%. It is higher than previous A1C of 7.1%.  Metformin was recently started at his last office visit with Dr. Drema Dallas. He is not at target dose yet and is only taking 1500 mg daily. Patient has a follow up visit with Dr. Drema Dallas in a couple of months. I discussed with patient additional options to get glucose to target including titrating metformin to goal dose of 2000 mg daily. Patient may also be a candidate for GLP-1 agonist like Victoza which would lower blood sugar and aid in weight loss. Encouraged patient to increase physical activity. We looked up chair exercise videos and patient seemed interested. I also encouraged him to eat something for breakfast. Follow up with patient in 3 months.   Teaching Goals:  Diabetes Mellitus: Other  Goals for Next Visit- 1. Find a chair exercise video on You Tube. Aim for 4 times a week. 2. Keep checking your blood sugar at least twice a day. 3. Don't skip meals entirely. If you feel like your blood sugar is too high before dinner, instead of skipping the meal entirely, eat protein and vegetables. 4. For breakfast- try a glucerna, Ensure or AdvantEdge. Next appointment to see me is Monday November 30th at 2:30 PM.

## 2013-11-20 ENCOUNTER — Encounter: Payer: Self-pay | Admitting: Family Medicine

## 2013-11-20 NOTE — Progress Notes (Signed)
Patient ID: Travis [name], male   DOB: 05/20/1955, 58 y.o.   MRN: [id number] Reviewed: Agree with the documentation and management of our  Pharmacologist. 

## 2013-12-14 ENCOUNTER — Ambulatory Visit (INDEPENDENT_AMBULATORY_CARE_PROVIDER_SITE_OTHER): Payer: Self-pay | Admitting: Family Medicine

## 2013-12-14 VITALS — BP 117/60 | Wt 376.0 lb

## 2013-12-14 DIAGNOSIS — E119 Type 2 diabetes mellitus without complications: Secondary | ICD-10-CM

## 2013-12-14 NOTE — Assessment & Plan Note (Signed)
Subjective:  Patient presents today for 3 month diabetes follow-up as part of the employer-sponsored Link to Wellness program. Current diabetes regimen includes metformin 1000 mg BID (this was increased at his last visit with Dr. Drema Dallas). Patient also continues on daily ACEi, and statin. No med changes or major health changes at this time.   Last visit with Dr. Drema Dallas was in June. He has an appointment pending for December 30th. His last A1C was 7.4% with me in August.  Weight is down to 376 lb (was 392 lb in August). He states that he thinks he has lost weight because he has cut back on his intake, cut back on "empty calories", cut back on soda intake. He has started logging his food intake on the live life well website.   Disease Assessments:  Diabetes: Year of diagnosis 2014; Sees Diabetes provider 2 times per year; Diabetes Education [facility] intense class; MD managing Diabetes NA; takes an aspirin a day; Type of Diabetes: Type 2; uses glucometer Using wife's old Meter. Will provide new True Result meter; checks feet daily; takes medications as prescribed; hypoglycemia frequency Occassional; checks blood glucose 2 times a day;   7 day CBG average 123; 14 day CBG average 123; 30 day CBG average 124; Highest CBG 172; Lowest CBG 81;   Other Diabetes History:  He states that he is checking at least twice daily- usually around four times daily. CBG averages are about 30-40 points lower than they were at last visit with me. The high CBG was 172 (last visit was 267). I congratulated patient on his success so far. He has also lost 16 pounds since his last visit with me. Most readings are between 90-130.  He denies hypoglycemia but states that he has had a few readings in the 80s in the late mornings.                Tobacco Assessment: Smoking Status: Never smoker; Last Reviewed: 12/14/2013   Social History:  Denies alcohol use; Diet adherence Less than 25% of the time; Exercise adherence 3-4 days  a week; 30 minutes of exercise per week; Medication adherence adherent; Patient can afford medications; Patient knows the purpose/use of medications.  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 He states he has cut back on "empty calories" which is mainly sodas. He is down to 1-2 diet sodas daily. He has also increased his water intake. He has cut back on bread intake. He states that he is eating two meals daily plus a snack. He is also doing better getting something to eat for breakfast (this is an improvement from last visit).  He has been drinking Glucerna for breakfast and also V8.  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 He has been having pain in his legs and knees. He has ordered new shoes and hopes that this will help. He was doing some chair exercises a few months ago but hasn't been doing anything lately.    Preventive Care:  Last Complete Physical: 12/24/2011  Hemoglobin A1c: 06/04/2013 7.1  Blood Glucose 06/18/2012 fasting Last Blood Glucose Value: 117    Colonoscopy: 03/24/2007  Dilated Eye Exam: 05/15/2013  Flu vaccine: 09/15/2013  Lipid Panel: 01/14/2012  Other Preventive Care Notes:  Dental Visit- pending for February 2016      Vital Signs:  12/14/2013 4:58 PM (EST)Blood Pressure 117 / 60 mm/HgBMI 55.5; Height 5 ft 9  in; Weight 376 lbs   Care Planning:  Learning Preference Assessment:  Learner: Patient, Significant Other  Readiness to Learn Barriers: None  Teaching Method: Demonstration, Explanation, Handout  Evaluation of Learning: Can function independently and verbalize knowledge  Readiness to Change:  How important is your health to you? 8  How confident are you in working to improve your health? 8  How ready are you to change to improve your health? 8  Total Score: 8  Care Plan:  12/14/2013 4:58 PM (EST) (1)  Problem: Physical Inactivity  Role:  Clinical Pharmacist  Long Term Goal Increase physical activity to three times weekly  Date Started: 12/14/2013   Teaching Goals:  Assessment/Plan: Patient is a 58 year old male with DM2. Most recent A1C is 7.4% which is above goal of less than 7%. Patient has a pending appointment with PCP in a few weeks. He declined A1C test today. Patient, however, has made significant progress towards weight loss and improved glycemic control. Through some physical activity and changes in his eating habits he has lost 16 pounds since his last appointment with me. Blood sugars have also improved dramatically- 7,14,30 day CBG averages are each 30-40 points lower than at his last visit with me. Based on these changes I expect that his A1C has improved since August.  I congratulated patient on his success and encouraged him to continue. Patient has obtained the badges for the first wellness payout on the Live Life Well website. I showed him how he can earn the remaining 2 badges (exercise and level up). We also looked at activity trackers with patient for an easier way to achieve these badges and earn $100.  Follow up with patient in 3 months.  Goals for Next Visit- 1. Increase protein intake. Aim to have protein with each meal. Continue tracking your food on the Live Life Well website. 2. Look into an activity tracker like fitbit or garmin. 3. Increase physical activity for at least three times a week. Try chair exercises. Next appointment to see me is Monday March 7th at 2:30 PM.

## 2014-01-04 NOTE — Progress Notes (Signed)
Patient ID: Travis [name], male   DOB: March 27, 1955, 58 y.o.   MRN: [id number] Reviewed: Agree with the documentation and management of our [place].

## 2014-02-11 ENCOUNTER — Encounter: Payer: Self-pay | Admitting: Cardiology

## 2014-03-26 ENCOUNTER — Ambulatory Visit (INDEPENDENT_AMBULATORY_CARE_PROVIDER_SITE_OTHER): Payer: Self-pay | Admitting: Family Medicine

## 2014-03-26 VITALS — BP 116/70 | Wt 373.2 lb

## 2014-03-26 DIAGNOSIS — E119 Type 2 diabetes mellitus without complications: Secondary | ICD-10-CM

## 2014-03-26 NOTE — Assessment & Plan Note (Signed)
Subjective:  Patient presents today for 3 month diabetes follow-up as part of the employer-sponsored Link to Wellness program. Current diabetes regimen includes metformin 1000 mg BID. Patient also continues on daily ASA, ACEi, and statin. Most recent MD follow-up was with Dr. Drema Dallas in December. A1C at that visit was 6.8%. Patient has a pending appt for July 2016. No med changes or major health changes at this time.   Patient had a UTI in January and was passing blood clots. He followed up with a urologist and was treated with Bactrim.   Disease Assessments:  Diabetes: Year of diagnosis 2014; Sees Diabetes provider 2 times per year; Diabetes Education [facility] intense class; MD managing Diabetes NA; takes an aspirin a day; Type of Diabetes: Type 2; uses glucometer Using wife's old Meter. Will provide new True Result meter; checks feet daily; takes medications as prescribed; hypoglycemia frequency Occassional; checks blood glucose 2 times a day;   7 day CBG average 126; 14 day CBG average 128; 30 day CBG average 132; Highest CBG 173; Lowest CBG 90;   Other Diabetes History:  Current DM medications- metformin 1000 mg twice daily. He is checking twice daily now. CBG averages are about the same as last visit. Fasting is usually running in the 140-150s. It lowers throughout the day and is usually low 100s when he checks again mid day. Denies hypoglycemia.       Past Medical/Surgical History:  The patient has a past medical history of Cancer prostate cancer, High Cholesterol, Hypertension, Cardiovascular Disease, Diabetes Mellitus type 2.   Other Medical History: Bradycardia  MI diverticulosis  Prior Surgery:  Appendectomy Cardiac Catherization   Tobacco Assessment: Smoking Status: Never smoker; Last Reviewed: 03/26/2014   Social History:  Denies alcohol use; Diet adherence Less than 25% of the time; Exercise adherence 3-4 days a week; 30 minutes of exercise per week; Medication adherence  adherent; Patient can afford medications; Patient knows the purpose/use of medications.  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 Versus this time last year he is down 27 pounds. He states that most of the time he is eating something for breakfast. He is eating some things from the cafeteria (eggs, bacon, sometimes hashbrowns), sometimes it is V8 juice.  He is drinking more water. He rarely has a regular soda, but usually it is diet or sprite zero. Overall with portion sizes he feels like he has cut back.  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 He sporadically does chair exercises. He is walking extra at work for 10 minutes. He has a fitness tracker now and is tracking his steps. He is getting around 5000 steps daily.    Preventive Care:    Hemoglobin A1c: 01/13/2014 via [facility] 6.8      Colonoscopy: 03/24/2007  Dilated Eye Exam: 05/15/2013  Flu vaccine: 09/15/2013  Lipid Panel: 01/14/2012  Other Preventive Care Notes:  Dental Visit- February 2106      Vital Signs:  03/26/2014 4:53 PM (EST)Blood Pressure 116 / 70 mm/HgBMI 55.1; Height 5 ft 9 in; Weight 373.2 lbs   Testing:  Blood Sugar Tests: Hemoglobin A1c: 6.8 via [facility] resulted on 01/13/2014   Care Planning:  Learning Preference Assessment:  Learner: Patient, Significant Other  Readiness to Learn Barriers: None  Teaching Method: Demonstration, Explanation, Handout  Evaluation of Learning: Can function independently and verbalize knowledge  Readiness to Change:  How important is your health to you? 8  How confident are  you in working to improve your health? 8  How ready are you to change to improve your health? 8  Total Score: 8  Care Plan:  03/26/2014 4:53 PM (EST) (2)  Problem: Skipping Meals  Role: Clinical Pharmacist  Long Term Goal Aim to eat three meals each day  Date Started: 03/26/2014  03/26/2014 4:53 PM (EST) (1)  Problem: Physical Inactivity   Role: Clinical Pharmacist  Long Term Goal Increase physical activity to three times weekly  Date Started: 12/14/2013  Goal Met patient states he is doing this most weeks.     Assessment/Plan: Patient is a 59 year old male with DM2. Most recent A1C in December was 6.8% and is meeting goal of less than 7%. Patient has also lost 3 pounds since his last appointment with me and is overall 27 pounds lighter than he was in 2015. He met his goal of increasing physical activity to three times weekly and is now using a fitness tracker. He is tracking his steps and physical activity into the Live Life Well wellness website so that he can claim his wellness badges. Patient's next appointment with PCP is in July and patient requested to be seen after that appointment. I will follow up with him in 4 months.  Goals for Next Visit- 1. Weight loss goal- if you can get down to 360 pounds you can claim the weight loss badge for the Live Life Well Website. 2. Consistently get three meals each day. 3. Stretch goal- aim for 5500 steps each day. Next appointment to see me is Monday July 25th at 2 PM.

## 2014-04-20 NOTE — Progress Notes (Signed)
ATTENDING PHYSICIAN NOTE: I have reviewed the chart and agree with the plan as detailed above. Halton Neas MD Pager [phone]  

## 2014-04-27 ENCOUNTER — Encounter: Payer: Self-pay | Admitting: Cardiology

## 2014-04-27 ENCOUNTER — Ambulatory Visit (INDEPENDENT_AMBULATORY_CARE_PROVIDER_SITE_OTHER): Payer: 59 | Admitting: Cardiology

## 2014-04-27 VITALS — BP 140/70 | HR 68 | Ht 69.5 in | Wt 388.0 lb

## 2014-04-27 DIAGNOSIS — E1169 Type 2 diabetes mellitus with other specified complication: Secondary | ICD-10-CM | POA: Insufficient documentation

## 2014-04-27 DIAGNOSIS — E785 Hyperlipidemia, unspecified: Secondary | ICD-10-CM

## 2014-04-27 DIAGNOSIS — I2510 Atherosclerotic heart disease of native coronary artery without angina pectoris: Secondary | ICD-10-CM

## 2014-04-27 DIAGNOSIS — I2583 Coronary atherosclerosis due to lipid rich plaque: Principal | ICD-10-CM

## 2014-04-27 DIAGNOSIS — I10 Essential (primary) hypertension: Secondary | ICD-10-CM | POA: Diagnosis not present

## 2014-04-27 DIAGNOSIS — R0789 Other chest pain: Secondary | ICD-10-CM

## 2014-04-27 DIAGNOSIS — I493 Ventricular premature depolarization: Secondary | ICD-10-CM

## 2014-04-27 NOTE — Patient Instructions (Signed)

## 2014-04-27 NOTE — Progress Notes (Signed)
Cardiology Office Note   Date:  04/27/2014   ID:  Travis [name], DOB 08/04/1955, MRN [id number]  PCP:  [name], MD    No chief complaint on file.     History of Present Illness: Travis [name] is a 59 y.o. male who presents for evaluation of CAD.  He has a history of ASCAD with MI in 1997 with PCI by Dr. Glade Lloyd, HTN, dyslipidemia and PVC's.  He is doing well.  He denies any chest pain, SOB, DOE, LE edema, dizziness, palpitations or syncope.    Past Medical History  Diagnosis Date  . Prostate cancer 2012  . Frequent unifocal PVCs   . Essential hypertension, benign   . Morbid obesity   . History of MI (myocardial infarction) 1997    Angioplasty  . Diverticulosis of colon with hemorrhage 2009  . Blood in stool   . Myalgia and myositis, unspecified   . Sleep apnea with use of continuous positive airway pressure (CPAP)   . Edema   . Family history of thyroid disease   . Encounter for long-term (current) use of other medications   . Type II or unspecified type diabetes mellitus without mention of complication, not stated as uncontrolled   . Appendicitis   . Coronary artery disease 1997    s/p PCI by Dr. Glade Lloyd  . Dyslipidemia, goal LDL below 70   . Dyslipidemia 04/27/2014    Past Surgical History  Procedure Laterality Date  . Appendectomy    . Angioplasty  1997    Dr. Glade Lloyd     Current Outpatient Prescriptions  Medication Sig Dispense Refill  . amLODipine (NORVASC) 10 MG tablet Take 10 mg by mouth daily.    [name] aspirin 81 MG tablet Take 1 tablet (81 mg total) by mouth daily.    [name] atorvastatin (LIPITOR) 80 MG tablet Take 80 mg by mouth daily.    [name] ezetimibe (ZETIA) 10 MG tablet Take 1 tablet (10 mg total) by mouth daily. 90 tablet 3  . fenofibrate micronized (LOFIBRA) 200 MG capsule Take 200 mg by mouth once.    . furosemide (LASIX) 20 MG tablet Take 20 mg by mouth daily as needed. Once or twice daily as needed    . lisinopril  (PRINIVIL,ZESTRIL) 40 MG tablet Take 40 mg by mouth daily.    . Magnesium-Zinc 133.33-5 MG TABS Take 1 tablet by mouth daily.    . metFORMIN (GLUCOPHAGE) 500 MG tablet Take 1,000 mg by mouth 2 (two) times daily with a meal.     . metoprolol succinate (TOPROL-XL) 100 MG 24 hr tablet Take 1 tablet (100 mg total) by mouth daily. Take with or immediately following a meal. 30 tablet 12   No current facility-administered medications for this visit.    Allergies:   Crestor    Social History:  The patient  reports that he has never smoked. He does not have any smokeless tobacco history on file. He reports that he does not drink alcohol or use illicit drugs.   Family History:  The patient's family history includes CVA in his father; Diabetes in his mother; Hypertension in his father and sister; Hypothyroidism in his sister.    ROS:  Please see the history of present illness.   Otherwise, review of systems are positive for none.   All other systems are reviewed and negative.    PHYSICAL EXAM: VS:  BP 140/70 mmHg  Pulse 68  Ht 5' 9.5" (1.765 m)  Wt  388 lb (175.996 kg)  BMI 56.50 kg/m2 , BMI Body mass index is 56.5 kg/(m^2). GEN: Well nourished, well developed, in no acute distress HEENT: normal Neck: no JVD, carotid bruits, or masses Cardiac: RRR; no murmurs, rubs, or gallops,no edema  Respiratory:  clear to auscultation bilaterally, normal work of breathing GI: soft, nontender, nondistended, + BS MS: no deformity or atrophy Skin: warm and dry, no rash Neuro:  Strength and sensation are intact Psych: euthymic mood, full affect   EKG:  EKG is not ordered today.    Recent Labs: 06/04/2013: BUN 18; Creatinine 0.7; Potassium 4.2; Sodium 140; TSH 0.78    Lipid Panel    Component Value Date/Time   CHOL  05/03/2007 0415    102        ATP III CLASSIFICATION:  <200     mg/dL   Desirable  200-239  mg/dL   Borderline High  >=240    mg/dL   High   TRIG 129 05/03/2007 0415   HDL 24*  05/03/2007 0415   CHOLHDL 4.3 05/03/2007 0415   VLDL 26 05/03/2007 0415   LDLCALC  05/03/2007 0415    52        Total Cholesterol/HDL:CHD Risk Coronary Heart Disease Risk Table                     Men   Women  1/2 Average Risk   3.4   3.3      Wt Readings from Last 3 Encounters:  04/27/14 388 lb (175.996 kg)  03/26/14 373 lb 3.2 oz (169.282 kg)  12/14/13 376 lb (170.552 kg)      ASSESSMENT AND PLAN:  1.  ASCAD with remote MI in 1997 with PCI - last nuclear stress test in 2014 showed mild inferolateral and apical primarily fixed defect with very small amount of peri infarct ischemia. Scan similar to prior scansHe has had this mild.  He has had atypical chest pain since his CABG years ago and has not changed. Continue medical management- no exertional angina.  He walks for exercise 2.  HTN - controlled on amlodipine/ACE I/BB 3.  Hyperlipidemia - continue fenofibrate/statin and zetia 4.  PVC's - asymptomatic 5.  OSA followed by pulmonary   Current medicines are reviewed at length with the patient today.  The patient does not have concerns regarding medicines.  The following changes have been made:  no change  Labs/ tests ordered today include: see above assessment and plan No orders of the defined types were placed in this encounter.     Disposition:   FU with me in 1 year   Signed, Sueanne Margarita, MD  04/27/2014 3:01 PM    [facility] [place], [place], [place]  [address] Phone: [phone]; Fax: [phone]

## 2014-05-25 ENCOUNTER — Encounter: Payer: Self-pay | Admitting: Oncology

## 2014-05-25 ENCOUNTER — Ambulatory Visit (INDEPENDENT_AMBULATORY_CARE_PROVIDER_SITE_OTHER): Payer: 59 | Admitting: Oncology

## 2014-05-25 VITALS — BP 123/46 | HR 72 | Temp 98.6°F | Ht 69.5 in | Wt 387.1 lb

## 2014-05-25 DIAGNOSIS — D696 Thrombocytopenia, unspecified: Secondary | ICD-10-CM

## 2014-05-25 HISTORY — DX: Thrombocytopenia, unspecified: D69.6

## 2014-05-25 LAB — COMPREHENSIVE METABOLIC PANEL
ALT: 29 U/L (ref 0–53)
AST: 40 U/L — ABNORMAL HIGH (ref 0–37)
Albumin: 4.1 g/dL (ref 3.5–5.2)
Alkaline Phosphatase: 52 U/L (ref 39–117)
BUN: 17 mg/dL (ref 6–23)
CALCIUM: 9.4 mg/dL (ref 8.4–10.5)
CO2: 26 mEq/L (ref 19–32)
Chloride: 102 mEq/L (ref 96–112)
Creat: 0.86 mg/dL (ref 0.50–1.35)
GLUCOSE: 96 mg/dL (ref 70–99)
POTASSIUM: 4.3 meq/L (ref 3.5–5.3)
Sodium: 140 mEq/L (ref 135–145)
Total Bilirubin: 0.7 mg/dL (ref 0.2–1.2)
Total Protein: 6.8 g/dL (ref 6.0–8.3)

## 2014-05-25 LAB — SAVE SMEAR

## 2014-05-25 NOTE — Progress Notes (Addendum)
Patient ID: Travis [name], male   DOB: Nov 16, 1955, 59 y.o.   MRN: [id number] New Patient Hematology   Travis [name] [id number] 1955-11-17 59 y.o. 05/25/2014  CC: Dr. Leighton Ruff   Reason for referral: Evaluate thrombocytopenia   HPI:  Morbidly obese 59 year old man with underlying hypertension, type 2 diabetes, hyperlipidemia, obstructive sleep apnea on CPAP, and coronary artery disease status post MI in 1997 with angioplasty without stent placement at that time. He was diagnosed with prostate cancer in 2012 was treated with radioactive seed implants. Laboratory in the Epic system dates back to 06/16/2010 when platelet count was 219,000. Lab data done through [facility] shows initial slight decrease in platelets at 149,000 on 06/18/2012. A platelet count done 01/13/2014 was 98,000. Repeat value on 02/15/2014 138,000. Repeat on 03/23/2014 77,000. He had a diverticular bleed back in 2009 and dropped his hemoglobin to 6.9 g. He recovered to 13 g but not any higher. He has a normal white count and normal differential. Liver functions recorded on 01/13/2014 were normal. He does not use alcohol. He has no history of hepatitis, yellow jaundice, or mononucleosis. He was treated for a simple urinary tract infection probably with Septra in January 2016. He has not had any easy bruising. No epistaxis or gum bleeding. No hematuria. No hematochezia subsequent to the diverticular bleed 7 years ago. He is on a number of chronic medications. The only 2 medications started within the last 2 years are metformin and Zetia. He has no known medication allergies. He has no signs or symptoms of a collagen vascular disorder. He denies lupus or rheumatoid arthritis. A maternal grandmother and 2 maternal aunts did have rheumatoid arthritis. There are no known blood disorders in his family. Mother still alive at aged 66. Father died of sudden death at age 44. He has a 28 year old sister who has an unexplained  elevated white count.  PMH: Past Medical History  Diagnosis Date  . Prostate cancer 2012  . Frequent unifocal PVCs   . Essential hypertension, benign   . Morbid obesity   . History of MI (myocardial infarction) 1997    Angioplasty  . Diverticulosis of colon with hemorrhage 2009  . Blood in stool   . Myalgia and myositis, unspecified   . Sleep apnea with use of continuous positive airway pressure (CPAP)   . Edema   . Family history of thyroid disease   . Encounter for long-term (current) use of other medications   . Type II or unspecified type diabetes mellitus without mention of complication, not stated as uncontrolled   . Appendicitis   . Coronary artery disease 1997    s/p PCI by Dr. Glade Lloyd  . Dyslipidemia, goal LDL below 70   . Dyslipidemia 04/27/2014  . Thrombocytopenia 05/25/2014  Right Bell's palsy. GERD but no history of peptic ulcer disease.  Past Surgical History  Procedure Laterality Date  . Appendectomy    . Angioplasty  1997    Dr. Glade Lloyd    Allergies: Allergies  Allergen Reactions  . Crestor [Rosuvastatin]     Muscle Pain     Medications:  Current outpatient prescriptions:  .  amLODipine (NORVASC) 10 MG tablet, Take 10 mg by mouth daily., Disp: , Rfl:  .  aspirin 81 MG tablet, Take 1 tablet (81 mg total) by mouth daily., Disp: , Rfl:  .  atorvastatin (LIPITOR) 80 MG tablet, Take 80 mg by mouth daily., Disp: , Rfl:  .  ezetimibe (ZETIA) 10 MG tablet, Take 1  tablet (10 mg total) by mouth daily., Disp: 90 tablet, Rfl: 3 .  fenofibrate micronized (LOFIBRA) 200 MG capsule, Take 200 mg by mouth once., Disp: , Rfl:  .  furosemide (LASIX) 20 MG tablet, Take 20 mg by mouth daily as needed. Once or twice daily as needed, Disp: , Rfl:  .  lisinopril (PRINIVIL,ZESTRIL) 40 MG tablet, Take 40 mg by mouth daily., Disp: , Rfl:  .  Magnesium-Zinc 133.33-5 MG TABS, Take 1 tablet by mouth daily., Disp: , Rfl:  .  metFORMIN (GLUCOPHAGE) 500 MG tablet, Take 1,000 mg by  mouth 2 (two) times daily with a meal. , Disp: , Rfl:  .  metoprolol succinate (TOPROL-XL) 100 MG 24 hr tablet, Take 1 tablet (100 mg total) by mouth daily. Take with or immediately following a meal., Disp: 30 tablet, Rfl: 12  Social History: Married and wife accompanies him today. I forgot to ask him his occupation.  reports that he has never smoked. He does not have any smokeless tobacco history on file. He reports that he does not drink alcohol or use illicit drugs.  Family History: Family History  Problem Relation Age of Onset  . CVA Father   . Hypertension Father   . Diabetes Mother     DM  . Hypothyroidism Sister   . Hypertension Sister     Review of Systems: See HPI One of his main concerns is that he has a huge hernia that needs to be repaired. He has not seen a Psychologist, sport and exercise yet. He wanted to find out about his platelets first. Remaining ROS negative.  Physical Exam: Blood pressure 123/46, pulse 72, temperature 98.6 F (37 C), temperature source Oral, height 5' 9.5" (1.765 m), weight 387 lb 1.6 oz (175.587 kg), SpO2 96 %. Wt Readings from Last 3 Encounters:  05/25/14 387 lb 1.6 oz (175.587 kg)  04/27/14 388 lb (175.996 kg)  03/26/14 373 lb 3.2 oz (169.282 kg)     General appearance: Morbidly obese Caucasian man in HENNT: Pharynx no erythema, exudate, mass, or ulcer. No thyromegaly or thyroid nodules Lymph nodes: No cervical, supraclavicular, or axillary lymphadenopathy Breasts:  Lungs: Clear to auscultation, resonant to percussion throughout Heart: Regular rhythm, no murmur, no gallop, no rub, no click, no edema Abdomen: Soft, nontender, normal bowel sounds, no obvious mass, no obvious organomegaly: Large abdomen difficult to examine. Extremities: 1+ edema, no calf tenderness, no petechiae Musculoskeletal: no joint deformities GU: Massive right inguinal hernia Vascular: Carotid pulses 2+, no bruits, Neurologic: Alert, oriented, PERRL,, cranial nerves grossly normal, motor  strength 5 over 5, reflexes 1+ symmetric upper extremities, absent symmetric lower extremities, upper body coordination normal, gait normal, Skin: No rash or ecchymosis. No petechiae    Lab Results: Lab Results  Component Value Date   WBC 8.8 06/16/2010   HGB 13.1 06/16/2010   HCT 39.5 06/16/2010   MCV 86.2 06/16/2010   PLT 219 06/16/2010     Chemistry      Component Value Date/Time   NA 140 06/04/2013 0831   K 4.2 06/04/2013 0831   CL 105 06/04/2013 0831   CO2 29 06/04/2013 0831   BUN 18 06/04/2013 0831   CREATININE 0.7 06/04/2013 0831      Component Value Date/Time   CALCIUM 8.9 06/04/2013 0831   ALKPHOS 74 06/16/2010 1110   AST 31 06/16/2010 1110   ALT 32 06/16/2010 1110   BILITOT 0.4 06/16/2010 1110    Lab today pending: Hb 13.2 Hct 39.5, WBC 8,200, 75% neutrophils,  17 lymphocytes, 8 monocytes, 0 eosinophils; platelets 82,000   Review of peripheral blood film: normochromic, normocytic red cells, mature neutrophils and lymphocytes; platelets normal in morphology, some large platelets, more than 10 platelets per high power field with estimated count 150,000     Impression: Fluctuating moderate thrombocytopenia occurring over the last 2 years. No obvious precipitating factors. No acute or chronic liver disease. No alcohol. He is not on any of the usual offending medications. No signs or symptoms of a collagen vascular disorder. Although unlikely, both Zetia and metformin were started 2 years ago and could possibly be related.  Recommendation: I told him to stop the Zetia. It is not an essential medication. He has a chronic, stable, mild, normochromic, anemia with hemoglobin 13 g. Most recent white count available from 01/13/2014 was normal at 6200 with 71% neutrophils, 18% lymphocytes, 10% monocytes. 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 Current recommendation for the management of chronic ITP is observation alone unless the platelet count falls to 30,000 or below. Most minor surgical procedures can be done with a platelet count of 50,000 or above. Platelet count of 100,000 or above required only for neurosurgery, spine surgery, or open heart surgery. I believe he can proceed safely with hernia surgery.     added 05/26/14  Annia Belt, MD 05/25/2014, 4:32 PM

## 2014-05-25 NOTE — Patient Instructions (Signed)
To lab today - we will call you with results Return visit  with Dr Darnell Level in 3-4 months - lab 1 week before

## 2014-05-26 ENCOUNTER — Telehealth: Payer: Self-pay | Admitting: *Deleted

## 2014-05-26 LAB — CBC WITH DIFFERENTIAL/PLATELET
BASOS ABS: 0 10*3/uL (ref 0.0–0.1)
Basophils Relative: 0 % (ref 0–1)
Eosinophils Absolute: 0 10*3/uL (ref 0.0–0.7)
Eosinophils Relative: 0 % (ref 0–5)
HCT: 39.5 % (ref 39.0–52.0)
Hemoglobin: 13.2 g/dL (ref 13.0–17.0)
Lymphocytes Relative: 17 % (ref 12–46)
Lymphs Abs: 1.4 10*3/uL (ref 0.7–4.0)
MCH: 29.1 pg (ref 26.0–34.0)
MCHC: 33.4 g/dL (ref 30.0–36.0)
MCV: 87.2 fL (ref 78.0–100.0)
MPV: 11.3 fL (ref 8.6–12.4)
Monocytes Absolute: 0.7 10*3/uL (ref 0.1–1.0)
Monocytes Relative: 8 % (ref 3–12)
NEUTROS ABS: 6.2 10*3/uL (ref 1.7–7.7)
Neutrophils Relative %: 75 % (ref 43–77)
PLATELETS: 82 10*3/uL — AB (ref 150–400)
RBC: 4.53 MIL/uL (ref 4.22–5.81)
RDW: 14.5 % (ref 11.5–15.5)
WBC: 8.2 10*3/uL (ref 4.0–10.5)

## 2014-05-26 NOTE — Telephone Encounter (Signed)
Pt called / informed Platelets count still low @ 82,000 which should be good enough for hernia surgery. Wants to know if he should stop taking Zetia? Thanks

## 2014-05-26 NOTE — Telephone Encounter (Signed)
-----   Message from Annia Belt, MD sent at 05/26/2014  9:09 AM EDT ----- Call pt: platelet count still low but at 82,000 which should be good enough for hernia surgery

## 2014-05-26 NOTE — Telephone Encounter (Signed)
Pt called and informed to he can stop Zetia.

## 2014-05-26 NOTE — Telephone Encounter (Signed)
Yes - I told him that yesterday!

## 2014-06-11 ENCOUNTER — Encounter: Payer: Self-pay | Admitting: Pharmacist

## 2014-06-15 ENCOUNTER — Other Ambulatory Visit: Payer: Self-pay | Admitting: Internal Medicine

## 2014-06-16 DEATH — deceased

## 2014-06-21 ENCOUNTER — Encounter: Payer: Self-pay | Admitting: Surgery

## 2014-06-21 ENCOUNTER — Other Ambulatory Visit: Payer: Self-pay | Admitting: Surgery

## 2014-06-21 DIAGNOSIS — E668 Other obesity: Secondary | ICD-10-CM | POA: Insufficient documentation

## 2014-06-21 DIAGNOSIS — G4733 Obstructive sleep apnea (adult) (pediatric): Secondary | ICD-10-CM | POA: Insufficient documentation

## 2014-06-21 DIAGNOSIS — Z8546 Personal history of malignant neoplasm of prostate: Secondary | ICD-10-CM | POA: Insufficient documentation

## 2014-06-21 DIAGNOSIS — E669 Obesity, unspecified: Secondary | ICD-10-CM | POA: Insufficient documentation

## 2014-07-06 ENCOUNTER — Telehealth: Payer: Self-pay | Admitting: Cardiology

## 2014-07-06 ENCOUNTER — Telehealth: Payer: Self-pay | Admitting: Internal Medicine

## 2014-07-06 DIAGNOSIS — G4733 Obstructive sleep apnea (adult) (pediatric): Secondary | ICD-10-CM

## 2014-07-06 NOTE — Telephone Encounter (Signed)
Yes

## 2014-07-06 NOTE — Telephone Encounter (Signed)
LMTCB on patients home number to call me back to schedule Sleep consult with CY and place order for CPAP supplies-will need to know DME company he currently uses.

## 2014-07-06 NOTE — Telephone Encounter (Signed)
CY I believe this was a [facility] patient-he needs to get new CPAP supplies but has not been seen in years. Are you okay with setting him up with next Sleep Consult appt(with you) and sending order for supplies. Please advise. Thanks.

## 2014-07-06 NOTE — Telephone Encounter (Signed)
Contacted patient to see what exactly he needed.  He is in need of a new RX for CPAP supplies sent to [facility].   Dr. [name] does not follow his sleep apnea.  He was diagnosed with OSA by Dr. Baird Lyons, and he has not seen him in several years. I told the patient that I would contact Dr. Janee Morn office for him, and see what they were able to do.   I spoke with Dr. Janee Morn nurse, Joellen Jersey, and she is going to get the message to Dr. Annamaria Boots to see if he wants to continue following his sleep apnea, She stated that she would call me back and let me know.    I have made contact with the patient to let him know what I have done, and that I will call him when I hear back from Dr. Janee Morn office.  He stated verbal understanding and appreciated the help.

## 2014-07-06 NOTE — Telephone Encounter (Signed)
New Message  Pt calling to speak w/ RN:  [facility] has been trying to have Dr. [name] sign off on CPAP machine. Please call back and discuss.

## 2014-07-07 NOTE — Telephone Encounter (Signed)
Spoke with pt, verified that he uses [facility].  Pt is wanting to know if supplies can be ordered for his cpap in the meantime before his appt.  Pt states he has a spare mask he can use but would prefer to have a new one ordered if possible.    CY please advise.  Thanks!

## 2014-07-07 NOTE — Telephone Encounter (Signed)
Pt can be placed on schedule in the next open time for sleep consult; we still need to know his DME and what supplies he needs. Thanks.

## 2014-07-07 NOTE — Telephone Encounter (Signed)
lmtcb X1 for pt to schedule next available sleep consult and to inquire about dme.

## 2014-07-07 NOTE — Telephone Encounter (Signed)
It appears that Dr. Janee Morn office will be following this patient.  I have let the patient know that he can call me if he needs to, but I gave him the phone number for Dr. Janee Morn office.

## 2014-07-07 NOTE — Telephone Encounter (Signed)
Travis [name] - where can I put this patient in for a Sleep Consult?  Please advise.

## 2014-07-07 NOTE — Telephone Encounter (Signed)
Pt called back & I scheduled appt with CY for 9/22 per previous note. He uses [facility] for cpap supplies. Pt's phone # [phone].

## 2014-07-08 NOTE — Telephone Encounter (Signed)
Ok to order DME [facility]  Replacement CPAP mask of choice, supplies, humidifier   Dx OSA  Need to see if we can find old records for sleep study

## 2014-07-08 NOTE — Telephone Encounter (Signed)
I have already taken care of ordering old [facility] chart.

## 2014-07-08 NOTE — Telephone Encounter (Signed)
Order placed on pt. Triage protocol is we don't track down results. So will forward back to [place] to follow up on this.

## 2014-07-29 ENCOUNTER — Other Ambulatory Visit: Payer: Self-pay | Admitting: Family Medicine

## 2014-07-29 DIAGNOSIS — R0989 Other specified symptoms and signs involving the circulatory and respiratory systems: Secondary | ICD-10-CM

## 2014-07-30 ENCOUNTER — Encounter: Payer: Self-pay | Admitting: Cardiology

## 2014-08-02 ENCOUNTER — Telehealth: Payer: Self-pay

## 2014-08-02 DIAGNOSIS — E785 Hyperlipidemia, unspecified: Secondary | ICD-10-CM

## 2014-08-02 NOTE — Telephone Encounter (Signed)
Informed patient of results and verbal understanding expressed.  Patient st he has not been taking Zetia (Dr. Beryle Beams stopped the medication 5/10 due to thrombocytopenia).  Encouraged patient to follow a low-fat, low-carb diet and exercise. Recall placed for patient to have fasting labs drawn in 4 months. Patient agrees with treatment plan.

## 2014-08-03 ENCOUNTER — Ambulatory Visit
Admission: RE | Admit: 2014-08-03 | Discharge: 2014-08-03 | Disposition: A | Discharge status: Home / Self Care | Payer: 59 | Source: Ambulatory Visit | Attending: Family Medicine | Admitting: Family Medicine

## 2014-08-03 DIAGNOSIS — R0989 Other specified symptoms and signs involving the circulatory and respiratory systems: Secondary | ICD-10-CM

## 2014-08-09 ENCOUNTER — Ambulatory Visit: Payer: Self-pay | Admitting: Pharmacist

## 2014-08-09 ENCOUNTER — Ambulatory Visit (INDEPENDENT_AMBULATORY_CARE_PROVIDER_SITE_OTHER): Payer: Self-pay | Admitting: Family Medicine

## 2014-08-09 VITALS — BP 122/66 | Ht 69.5 in | Wt 371.0 lb

## 2014-08-09 DIAGNOSIS — E119 Type 2 diabetes mellitus without complications: Secondary | ICD-10-CM

## 2014-08-09 NOTE — Progress Notes (Signed)
Subjective:  Patient presents today for 3 month diabetes follow-up as part of the employer-sponsored Link to Wellness program.  Current diabetes regimen includes metformin 1000 mg BID.  Patient also continues on daily ASA, ACE Inhibitor and statin.    He is no longer taking Zetia because of concerns with a low platelet count. He is followed by Dr. Beryle Beams.   He has a pending consult with Todd Henniford in [place] for a hernia repair and possible reconstruction of the abdomen/pelvic area.   Last visit with Dr. Drema Dallas was earlier this month. A1C at that visit was 6.7%. Next appointment with Dr. Drema Dallas is in January.    Assessment/Plan:  Patient is a 59 y.o. male with DM 2. Most recent A1C was   6.7% which is at goal of less than 7%. Weight is decreased by 2 pounds from last visit with me.   CBG Review: Patient is checking BID- first is fasting and the second is after 2-3 hours of working. First fasting is running between 100-140, 2nd fasting is usually 30 points lower.   Denies hypoglycemia.   High/Low 173/87  Lifestyle improvements:  Physical Activity-  He is walking a few times a week for 20 minutes. Weight is down 2 pounds since last visit.   Nutrition-  He is doing better about eating bre   Follow up with me in 3 months.    Goals for Next Visit:  1. Aim for 3 meals each day, even if one or two of the meals are small meals. Eat something for breakfast- even if it is a piece of fruit, a granola bar, slice of cheese, etc.  2. Increase your daily step count to an average of 4000 steps daily.  3. Continue physical activity walk as much as you are able.     Next appointment to see me is: Monday October 31st at 2 PM.     Jinny Blossom D. Donneta Romberg, PharmD, BCPS, CDE [facility] [place] Coordinator [phone]

## 2014-08-16 NOTE — Progress Notes (Signed)
Patient ID: Travis [name], male   DOB: 1955/03/07, 59 y.o.   MRN: [id number] ATTENDING PHYSICIAN NOTE: I have reviewed the chart and agree with the plan as detailed above. Dorcas Mcmurray MD Pager [phone]

## 2014-09-07 ENCOUNTER — Other Ambulatory Visit (INDEPENDENT_AMBULATORY_CARE_PROVIDER_SITE_OTHER): Payer: 59

## 2014-09-07 DIAGNOSIS — D696 Thrombocytopenia, unspecified: Secondary | ICD-10-CM | POA: Diagnosis not present

## 2014-09-08 LAB — CBC WITH DIFFERENTIAL/PLATELET
BASOS: 0 %
Basophils Absolute: 0 10*3/uL (ref 0.0–0.2)
EOS (ABSOLUTE): 0 10*3/uL (ref 0.0–0.4)
EOS: 1 %
HEMATOCRIT: 36.5 % — AB (ref 37.5–51.0)
HEMOGLOBIN: 11.6 g/dL — AB (ref 12.6–17.7)
IMMATURE GRANULOCYTES: 2 %
Immature Grans (Abs): 0.1 10*3/uL (ref 0.0–0.1)
LYMPHS ABS: 1.5 10*3/uL (ref 0.7–3.1)
Lymphs: 19 %
MCH: 28 pg (ref 26.6–33.0)
MCHC: 31.8 g/dL (ref 31.5–35.7)
MCV: 88 fL (ref 79–97)
MONOCYTES: 8 %
Monocytes Absolute: 0.6 10*3/uL (ref 0.1–0.9)
Neutrophils Absolute: 5.6 10*3/uL (ref 1.4–7.0)
Neutrophils: 71 %
Platelets: 153 10*3/uL (ref 150–379)
RBC: 4.15 x10E6/uL (ref 4.14–5.80)
RDW: 15 % (ref 12.3–15.4)
WBC: 7.8 10*3/uL (ref 3.4–10.8)

## 2014-09-14 ENCOUNTER — Ambulatory Visit (INDEPENDENT_AMBULATORY_CARE_PROVIDER_SITE_OTHER): Payer: 59 | Admitting: Oncology

## 2014-09-14 ENCOUNTER — Telehealth: Payer: Self-pay

## 2014-09-14 ENCOUNTER — Encounter: Payer: Self-pay | Admitting: Oncology

## 2014-09-14 VITALS — BP 128/55 | HR 84 | Temp 98.2°F | Ht 69.5 in | Wt 389.0 lb

## 2014-09-14 DIAGNOSIS — D696 Thrombocytopenia, unspecified: Secondary | ICD-10-CM

## 2014-09-14 DIAGNOSIS — Z8546 Personal history of malignant neoplasm of prostate: Secondary | ICD-10-CM

## 2014-09-14 DIAGNOSIS — E785 Hyperlipidemia, unspecified: Secondary | ICD-10-CM

## 2014-09-14 NOTE — Patient Instructions (Signed)
Return as needed

## 2014-09-14 NOTE — Telephone Encounter (Signed)
Sueanne Margarita, MD   Sent: Tue September 14, 2014 3:40 PM    To: Theodoro Parma, RN        Message     Patient taken off Zetia due to thrombocytopenia - please get FLP    ----- Message -----     From: Annia Belt, MD     Sent: 09/14/2014  2:58 PM      To: Sueanne Margarita, MD     Patient agrees to come Friday, Sept. 2 for FLP.

## 2014-09-14 NOTE — Progress Notes (Signed)
Patient ID: Travis [name], male   DOB: July 17, 1955, 59 y.o.   MRN: [id number] Hematology and Oncology Follow Up Visit  Travis [name] [id number] April 02, 1955 59 y.o. 09/14/2014 2:53 PM   Principle Diagnosis: Encounter Diagnoses  Name Primary?  . Thrombocytopenia Yes  . Personal history of malignant neoplasm of prostate      Interim History:  59 year old man I saw back in May for further evaluation of thrombocytopenia. Platelet count fell coincidentally with initiation of Zetia and Glucophage. Since the Zetia was a nonessential medication I suggested to the patient that we discontinue it. I am pleased to report that a blood count done last week in anticipation of today's visit shows his platelet count has come up from 82,000 in May to 153,000. This is presumptive evidence that it was in fact the Zetia that was the culprit. He has had no other interim medical problems. He is still under evaluation for a left inguinal hernia repair. He was referred by our surgeons to a more specialized surgeon in [place] in view of his morbid obesity. He is now clear from the hematology standpoint for surgery. I did not schedule a formal follow-up visit. I would be happy to see him again in the future if the need arises.  Medications: reviewed  Allergies:  Allergies  Allergen Reactions  . Crestor [Rosuvastatin]     Muscle Pain      Physical Exam: Blood pressure 128/55, pulse 84, temperature 98.2 F (36.8 C), temperature source Oral, height 5' 9.5" (1.765 m), weight 389 lb (176.449 kg), SpO2 98 %. Wt Readings from Last 3 Encounters:  09/14/14 389 lb (176.449 kg)  08/09/14 371 lb (168.284 kg)  05/25/14 387 lb 1.6 oz (175.587 kg)    I did not reexamine the patient today. See my office consult note from 05/25/2014.  Lab Results: CBC W/Diff    Component Value Date/Time   WBC 7.8 09/07/2014 1422   WBC 8.2 05/25/2014 1515   RBC 4.15 09/07/2014 1422   RBC 4.53 05/25/2014 1515   HGB 13.2 05/25/2014  1515   HCT 36.5* 09/07/2014 1422   HCT 39.5 05/25/2014 1515   PLT 82* 05/25/2014 1515   MCV 87.2 05/25/2014 1515   MCH 28.0 09/07/2014 1422   MCH 29.1 05/25/2014 1515   MCHC 31.8 09/07/2014 1422   MCHC 33.4 05/25/2014 1515   RDW 15.0 09/07/2014 1422   RDW 14.5 05/25/2014 1515   LYMPHSABS 1.5 09/07/2014 1422   LYMPHSABS 1.4 05/25/2014 1515   MONOABS 0.7 05/25/2014 1515   EOSABS 0.0 05/25/2014 1515   BASOSABS 0.0 09/07/2014 1422   BASOSABS 0.0 05/25/2014 1515     Chemistry      Component Value Date/Time   NA 140 05/25/2014 1515   K 4.3 05/25/2014 1515   CL 102 05/25/2014 1515   CO2 26 05/25/2014 1515   BUN 17 05/25/2014 1515   CREATININE 0.86 05/25/2014 1515   CREATININE 0.7 06/04/2013 0831      Component Value Date/Time   CALCIUM 9.4 05/25/2014 1515   ALKPHOS 52 05/25/2014 1515   AST 40* 05/25/2014 1515   ALT 29 05/25/2014 1515   BILITOT 0.7 05/25/2014 1515       Radiological Studies: No results found.  Impression:  Likely Zetia related thrombocytopenia resolved off this medication.   CC: Patient Care Team: Leighton Ruff, MD as PCP - General (Family Medicine) Joretta Bachelor, [facility] as [place] Management (Pharmacist) Sueanne Margarita, MD as Consulting Physician (Cardiology)  Annia Belt, MD 8/30/20162:53 PM

## 2014-09-17 ENCOUNTER — Other Ambulatory Visit (INDEPENDENT_AMBULATORY_CARE_PROVIDER_SITE_OTHER): Payer: 59 | Admitting: *Deleted

## 2014-09-17 DIAGNOSIS — E785 Hyperlipidemia, unspecified: Secondary | ICD-10-CM

## 2014-09-17 LAB — LIPID PANEL
CHOL/HDL RATIO: 5
Cholesterol: 133 mg/dL (ref 0–200)
HDL: 26.7 mg/dL — AB (ref >39.00)
LDL CALC: 68 mg/dL (ref 0–99)
NonHDL: 106.76
TRIGLYCERIDES: 195 mg/dL — AB (ref 0.0–149.0)
VLDL: 39 mg/dL (ref 0.0–40.0)

## 2014-10-07 ENCOUNTER — Ambulatory Visit (INDEPENDENT_AMBULATORY_CARE_PROVIDER_SITE_OTHER): Payer: 59 | Admitting: Internal Medicine

## 2014-10-07 ENCOUNTER — Encounter: Payer: Self-pay | Admitting: Internal Medicine

## 2014-10-07 VITALS — BP 122/74 | HR 76 | Ht 69.5 in | Wt 391.8 lb

## 2014-10-07 DIAGNOSIS — G4733 Obstructive sleep apnea (adult) (pediatric): Secondary | ICD-10-CM | POA: Diagnosis not present

## 2014-10-07 DIAGNOSIS — G4730 Sleep apnea, unspecified: Secondary | ICD-10-CM | POA: Diagnosis not present

## 2014-10-07 NOTE — Progress Notes (Signed)
10/07/14- [name] never smoker referred by Dr [name] for OSA, complicated by morbid obesity, CAD/ MI/ PTCA, HBP, DM Former patient-[place] chest patient; DME: [facility]; current CPAP on 12 and machine is over 58 years old; sleep study over 10 years ago. Needs to update sleep study to replace very old machine and supplies. He is using his second machine but it is old and in bad repair, pressure 12, used all night every night. Bedtime between 5 and 6 PM, getting up to start his day at 2 AM because he starts work at [employer] AM working in [employer] at [facility] [employer]. No ENT surgery and no history of lung disease. Coronary artery disease with history of MI reviewed.  Prior to Admission medications   Medication Sig Start Date End Date Taking? Authorizing Ehsan Corvin  aspirin 81 MG tablet Take 1 tablet (81 mg total) by mouth daily. 04/23/13  Yes Sueanne Margarita, MD  atorvastatin (LIPITOR) 80 MG tablet Take 80 mg by mouth daily.   Yes Historical Emma Birchler, MD  fenofibrate micronized (LOFIBRA) 200 MG capsule Take 200 mg by mouth once.   Yes Historical Aslin Farinas, MD  ibuprofen (ADVIL,MOTRIN) 200 MG tablet Take 200 mg by mouth as needed.   Yes Historical Rogers Ditter, MD  lisinopril (PRINIVIL,ZESTRIL) 40 MG tablet Take 40 mg by mouth daily.   Yes Historical Eliodoro Gullett, MD  metFORMIN (GLUCOPHAGE) 500 MG tablet Take 1,000 mg by mouth 2 (two) times daily with a meal.    Yes Historical Elva Mauro, MD  metoprolol succinate (TOPROL-XL) 100 MG 24 hr tablet TAKE 1 TABLET BY MOUTH ONCE DAILY WITH OR IMMEDIATELY FOLLOWING A MEAL 06/16/14  Yes Thompson Grayer, MD   Past Medical History  Diagnosis Date  . Prostate cancer 2012  . Frequent unifocal PVCs   . Essential hypertension, benign   . Morbid obesity   . History of MI (myocardial infarction) 1997    Angioplasty  . Diverticulosis of colon with hemorrhage 2009  . Blood in stool   . Myalgia and myositis, unspecified   . Sleep apnea with use of continuous positive airway pressure (CPAP)   .  Edema   . Family history of thyroid disease   . Encounter for long-term (current) use of other medications   . Type II or unspecified type diabetes mellitus without mention of complication, not stated as uncontrolled   . Appendicitis   . Coronary artery disease 1997    s/p PCI by Dr. Glade Lloyd  . Dyslipidemia, goal LDL below 70   . Dyslipidemia 04/27/2014  . Thrombocytopenia 05/25/2014   Past Surgical History  Procedure Laterality Date  . Appendectomy    . Angioplasty  1997    Dr. Glade Lloyd   Family History  Problem Relation Age of Onset  . CVA Father   . Hypertension Father   . Diabetes Mother     DM  . Hypothyroidism Sister   . Hypertension Sister    Social History   Social History  . Marital Status: Married    Spouse Name: N/A  . Number of Children: N/A  . Years of Education: N/A   Occupational History  . 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 Smoking status: Never Smoker   . Smokeless tobacco: Not on file  . Alcohol Use: No  . Drug Use: No  . Sexual Activity: Not on file   Other Topics Concern  . Not on file   Social History Narrative  ROS-see HPI   Negative unless "+" Constitutional:    weight loss, night sweats, fevers, chills, fatigue, lassitude. HEENT:    headaches, difficulty swallowing, tooth/dental problems, sore throat,       sneezing, itching, ear ache, + nasal congestion, post nasal drip, snoring CV:    chest pain, orthopnea, PND, swelling in lower extremities, anasarca,                                                       dizziness, + palpitations Resp:   + shortness of breath with exertion or at rest.                productive cough,   non-productive cough, coughing up of blood.              change in color of mucus.  wheezing.   Skin:    rash or lesions. GI:  No-   heartburn, indigestion, abdominal pain, nausea, vomiting, diarrhea,                 change in bowel habits, loss of appetite GU: dysuria, change  in color of urine, no urgency or frequency.   flank pain. MS:  + joint pain, stiffness, decreased range of motion, back pain. Neuro-     nothing unusual Psych:  change in mood or affect.  depression or anxiety.   memory loss.  OBJ- Physical Exam General- Alert, Oriented, Affect-appropriate, Distress- none acute, + morbidly obese Skin- rash-none, lesions- none, excoriation- none Lymphadenopathy- none Head- atraumatic            Eyes- Gross vision intact, PERRLA, conjunctivae and secretions clear            Ears- Hearing, canals-normal            Nose- Clear, no-Septal dev, mucus, polyps, erosion, perforation             Throat- Mallampati IV , mucosa clear , drainage- none, tonsils- atrophic Neck- flexible , trachea midline, no stridor , thyroid nl, carotid no bruit Chest - symmetrical excursion , unlabored           Heart/CV- RRR , no murmur , no gallop  , no rub, nl s1 s2                           - JVD- none , edema- none, stasis changes- none, varices- none           Lung- clear to P&A, wheeze- none, cough- none , dullness-none, rub- none           Chest wall-  Abd-  Br/ Gen/ Rectal- Not done, not indicated Extrem- cyanosis- none, clubbing, none, atrophy- none, strength- nl Neuro- grossly intact to observation

## 2014-10-07 NOTE — Assessment & Plan Note (Signed)
Known obstructive sleep apnea with remote diagnostic testing. He needs replacement equipment but has been very compliant with CPAP. Plan-scheduled new polysomnogram to update objective documentation. Replacement CPAP at current pressure with appropriate mask and supplies.

## 2014-10-07 NOTE — Assessment & Plan Note (Signed)
Not sure if his cardiac status allows consideration of bariatric surgery but it should be a consideration.

## 2014-10-07 NOTE — Patient Instructions (Signed)
Order- schedule split protocol NPSG    Dx OSA 

## 2014-10-14 ENCOUNTER — Ambulatory Visit (HOSPITAL_BASED_OUTPATIENT_CLINIC_OR_DEPARTMENT_OTHER): Payer: 59 | Attending: Internal Medicine

## 2014-10-14 DIAGNOSIS — E119 Type 2 diabetes mellitus without complications: Secondary | ICD-10-CM | POA: Diagnosis not present

## 2014-10-14 DIAGNOSIS — E669 Obesity, unspecified: Secondary | ICD-10-CM | POA: Insufficient documentation

## 2014-10-14 DIAGNOSIS — R5383 Other fatigue: Secondary | ICD-10-CM | POA: Diagnosis not present

## 2014-10-14 DIAGNOSIS — G4730 Sleep apnea, unspecified: Secondary | ICD-10-CM

## 2014-10-14 DIAGNOSIS — R0683 Snoring: Secondary | ICD-10-CM | POA: Insufficient documentation

## 2014-10-14 DIAGNOSIS — Z6841 Body Mass Index (BMI) 40.0 and over, adult: Secondary | ICD-10-CM | POA: Diagnosis not present

## 2014-10-14 DIAGNOSIS — G4733 Obstructive sleep apnea (adult) (pediatric): Secondary | ICD-10-CM | POA: Insufficient documentation

## 2014-10-17 DIAGNOSIS — G4730 Sleep apnea, unspecified: Secondary | ICD-10-CM | POA: Diagnosis not present

## 2014-10-17 NOTE — Progress Notes (Signed)
Patient Name: Travis [name], Travis [name] Date: 10/14/2014 Gender: Male D.O.B: 1955/08/21 Age (years): 89 Referring Provider: Baird Lyons MD, ABSM Height (inches): 69 Interpreting Physician: Baird Lyons MD, ABSM Weight (lbs): 390 RPSGT: Baxter Flattery BMI: 55 MRN: [id number] Neck Size: 20.00 CLINICAL INFORMATION Sleep Study Type: Split Night CPAP  Indication for sleep study: Diabetes, Fatigue, Obesity, OSA, Snoring, Witnessed Apneas  Epworth Sleepiness Score: 3  SLEEP STUDY TECHNIQUE As per the AASM Manual for the Scoring of Sleep and Associated Events v2.3 (April 2016) with a hypopnea requiring 4% desaturations.  The channels recorded and monitored were frontal, central and occipital EEG, electrooculogram (EOG), submentalis EMG (chin), nasal and oral airflow, thoracic and abdominal wall motion, anterior tibialis EMG, snore microphone, electrocardiogram, and pulse oximetry. Continuous positive airway pressure (CPAP) was initiated when the patient met split night criteria and was titrated according to treat sleep-disordered breathing.  MEDICATIONS Medications taken by the patient : charted for review Medications administered by patient during sleep study : No sleep medicine administered.  RESPIRATORY PARAMETERS Diagnostic  Total AHI (/hr): 124.2 RDI (/hr): 124.2 OA Index (/hr): 122.4 CA Index (/hr): 0.0 REM AHI (/hr): N/A NREM AHI (/hr): 124.2 Supine AHI (/hr): 118.2 Non-supine AHI (/hr): 131.69 Min O2 Sat (%): 69.00 Mean O2 (%): 91.67 Time below 88% (min): 34.5   Titration  Optimal Pressure (cm): 16 AHI at Optimal Pressure (/hr): 6.7 Min O2 at Optimal Pressure (%): 89.0 Supine % at Optimal (%): 0 Sleep % at Optimal (%): 80    SLEEP ARCHITECTURE The recording time for the entire night was 367.4 minutes.  During a baseline period of 171.7 minutes, the patient slept for 134.3 minutes in REM and nonREM, yielding a sleep efficiency of 78.2%. Sleep onset after lights out was 2.4  minutes with a REM latency of N/A minutes. The patient spent 7.45% of the night in stage N1 sleep, 92.55% in stage N2 sleep, 0.00% in stage N3 and 0.00% in REM.  During the titration period of 189.5 minutes, the patient slept for 147.0 minutes in REM and nonREM, yielding a sleep efficiency of 77.6%. Sleep onset after CPAP initiation was 0.5 minutes with a REM latency of 39.5 minutes. The patient spent 20.07% of the night in stage N1 sleep, 62.24% in stage N2 sleep, 0.00% in stage N3 and 17.69% in REM.  CARDIAC DATA The 2 lead EKG demonstrated sinus rhythm. The mean heart rate was 68.39 beats per minute.   LEG MOVEMENT DATA The total Periodic Limb Movements of Sleep (PLMS) were 61. The PLMS index was 12.89 .  IMPRESSIONS Severe obstructive sleep apnea occurred during the diagnostic portion of the study (AHI = 124.2/hour). An optimal PAP pressure was selected for this patient ( 16 cm of water) No significant central sleep apnea occurred during the diagnostic portion of the study (CAI = 0.0/hour). Severe oxygen desaturation was noted during the diagnostic portion of the study (Min O2 = 69.00%). Loud snoring was audible during the diagnostic portion of the study. EKG findings include normal sinus rhythm Clinically significant periodic limb movements did not occur during sleep.  DIAGNOSIS Obstructive Sleep Apnea (327.23 [G47.33 ICD-10])  RECOMMENDATIONS Trial of CPAP therapy on 16 cm H2O with a Large size Fisher&Paykel Full Face Mask Simplus mask and heated humidification. Avoid alcohol, sedatives and other CNS depressants that may worsen sleep apnea and disrupt normal sleep architecture. Sleep hygiene should be reviewed to assess factors that may improve sleep quality. Weight management and regular exercise should be initiated or continued.  [place], American Board of Sleep Medicine  ELECTRONICALLY SIGNED ON:  10/17/2014, 1:36 PM [place] PH:  [phone]   FX: [phone] [place]

## 2014-11-15 ENCOUNTER — Ambulatory Visit (INDEPENDENT_AMBULATORY_CARE_PROVIDER_SITE_OTHER): Payer: Self-pay | Admitting: Family Medicine

## 2014-11-15 VITALS — Wt 382.0 lb

## 2014-11-15 DIAGNOSIS — E119 Type 2 diabetes mellitus without complications: Secondary | ICD-10-CM

## 2014-11-15 NOTE — Progress Notes (Signed)
Subjective:  Patient presents today for 3 month diabetes follow-up as part of the employer-sponsored Link to Wellness program.  Current diabetes regimen includes metformin 1000 mg BID.   Patient also continues on daily ASA, ACE Inhibitor and statin.  No med changes or major health changes at this time.   Last visit with Dr. Drema Dallas was in June. A1C at that visit was 7.1%. Next visit pending with her is in December.   He had a consultation with a surgeon in [place] for hernia repair surgery. He was supposed to have a CT scan this week but because of the family issues he is having right now he did not complete the study. He thinks he will reschedule this in January. The surgeon wanted him to lose 100 pounds prior to surgery. The surgeon suggested doing a gastric sleeve bu patient is apprehensive about having surgery.    Assessment:  Diabetes: Most recent A1C was 6.7  % which is at goal of less than 7%. Weight is increased from last visit with me.    CBG Review: Patient is checking once daily in the morning. Sometimes checking twice a day.   Averages are self reported. CBG averages are about 10 points higher than last time. He states that fasting results are usually between 127-152.   Lowest- 110, High-172 (he is unsure why this was elevated).  Denies hypoglycemia.    Lifestyle improvements:  Physical Activity-  Patient is currently having to move most of the belongings out of his house and into pods as he is trying to treat for bedbugs. Still walking while he is working at work. Three days a week he is walking an extra 20 minutes at work.   He reports that he is getting on average 2500 and 2800 steps daily.   Nutrition-  He is trying to eat more than 1 meal daily. He is trying to get 2 meals plus a snack.   He isn't eating out as much lately and is eating more at home. He is eating some take out.   Follow up with me in 3 months.    Plan/Goals for Next Visit:  1. Check out the  bariatric surgery seminars. Here's the link to sign up: [email or web address] 2. Keep working to make sure that you are getting 4000 steps every day. Ideas to get there- walks after work, working around your house to get things moved and cleaned out. Remember to wear it so you can track your steps.  3. Eat more vegetables- aim for at least three servings each day.      Next appointment to see me is: Monday January 30th at 2 PM.    Jinny Blossom D. Donneta Romberg, PharmD, BCPS, CDE [facility] to [place] Coordinator [phone]

## 2014-11-29 NOTE — Patient Outreach (Signed)
This visit was done over the phone because patient is in the middle of a bed bug infestation and did not want to come to the pharmacy. Weight and CBGs are self reported.

## 2014-11-30 NOTE — Progress Notes (Signed)
I have reviewed this pharmacist's note and agree  

## 2014-12-27 ENCOUNTER — Ambulatory Visit (HOSPITAL_BASED_OUTPATIENT_CLINIC_OR_DEPARTMENT_OTHER): Payer: Self-pay

## 2015-01-13 ENCOUNTER — Encounter (HOSPITAL_BASED_OUTPATIENT_CLINIC_OR_DEPARTMENT_OTHER): Payer: Self-pay

## 2015-02-07 ENCOUNTER — Encounter: Payer: Self-pay | Admitting: Internal Medicine

## 2015-02-07 ENCOUNTER — Ambulatory Visit (INDEPENDENT_AMBULATORY_CARE_PROVIDER_SITE_OTHER): Payer: 59 | Admitting: Internal Medicine

## 2015-02-07 VITALS — BP 124/62 | HR 80 | Ht 69.5 in | Wt 382.2 lb

## 2015-02-07 DIAGNOSIS — G4733 Obstructive sleep apnea (adult) (pediatric): Secondary | ICD-10-CM | POA: Diagnosis not present

## 2015-02-07 NOTE — Patient Instructions (Signed)
Order- [facility]ME [facility]- Renew CPAP orders-  Increase to 16 cwp, mask of choice, humidifier, supplies, add AirView                   Dx OSA  Referral to Bariatric program to learn about weight loss options

## 2015-02-07 NOTE — Progress Notes (Signed)
10/07/14- [name] never smoker referred by Dr [name] for OSA, complicated by morbid obesity, CAD/ MI/ PTCA, HBP, DM Former patient-[place] chest patient; DME: [facility]; current CPAP on 12 and machine is over 59 years old; sleep study over 10 years ago. Needs to update sleep study to replace very old machine and supplies. He is using his second machine but it is old and in bad repair, pressure 12, used all night every night. Bedtime between 5 and 6 PM, getting up to start his day at 2 AM because he starts work at [employer] AM working in [employer] at [facility]. No ENT surgery and no history of lung disease. Coronary artery disease with history of MI reviewed.  02/07/2015-60 year old male never smoker followed for OSA, complicated by morbid obesity, CAD/MI/PTCA, HBP, DM CPAP 12/[facility] wearing cpap nightly for average of 5-7 hrs.  No problems with pressure.  Has daytime sleepiness.   NPSG  10/14/14 AHI 124.2/ hr, CPAP to 16, desat to 69%, weight 390 lbs We reviewed his sleep study and health implications. We will increase CPAP to 16 for better control He has a massive right inguinal hernia being evaluated for surgical repair in [place].  ROS-see HPI   Negative unless "+" Constitutional:    weight loss, night sweats, fevers, chills, fatigue, lassitude. HEENT:    headaches, difficulty swallowing, tooth/dental problems, sore throat,       sneezing, itching, ear ache, + nasal congestion, post nasal drip, snoring CV:    chest pain, orthopnea, PND, swelling in lower extremities, anasarca,                                                       dizziness, + palpitations Resp:   + shortness of breath with exertion or at rest.                productive cough,   non-productive cough, coughing up of blood.              change in color of mucus.  wheezing.   Skin:    rash or lesions. GI:  No-   heartburn, indigestion, abdominal pain, nausea, vomiting, diarrhea,                 change in bowel habits, loss of  appetite GU: dysuria, change in color of urine, no urgency or frequency.   flank pain. MS:  + joint pain, stiffness, decreased range of motion, back pain. Neuro-     nothing unusual Psych:  change in mood or affect.  depression or anxiety.   memory loss.  OBJ- Physical Exam General- Alert, Oriented, Affect-appropriate, Distress- none acute, + morbidly obese Skin- rash-none, lesions- none, excoriation- none Lymphadenopathy- none Head- atraumatic            Eyes- Gross vision intact, PERRLA, conjunctivae and secretions clear            Ears- Hearing, canals-normal            Nose- Clear, no-Septal dev, mucus, polyps, erosion, perforation             Throat- Mallampati IV , mucosa clear , drainage- none, tonsils- atrophic Neck- flexible , trachea midline, no stridor , thyroid nl, carotid no bruit Chest - symmetrical excursion , unlabored  Heart/CV- RRR , no murmur , no gallop  , no rub, nl s1 s2                           - JVD- none , edema- none, stasis changes- none, varices- none           Lung- clear to P&A, wheeze- none, cough- none , dullness-none, rub- none           Chest wall-  Abd- + massive right inguinal hernia and significant obesity Br/ Gen/ Rectal- Not done, not indicated Extrem- cyanosis- none, clubbing, none, atrophy- none, strength- nl Neuro- grossly intact to observation

## 2015-02-13 NOTE — Assessment & Plan Note (Signed)
Encouraged efforts at weight loss. Not sure how much of what we see is known large right inguinal hernia and how much his abdominal pannus.

## 2015-02-14 ENCOUNTER — Encounter: Payer: Self-pay | Admitting: Pharmacist

## 2015-02-14 ENCOUNTER — Ambulatory Visit (INDEPENDENT_AMBULATORY_CARE_PROVIDER_SITE_OTHER): Payer: Self-pay | Admitting: Family Medicine

## 2015-02-14 VITALS — BP 118/60 | Wt 382.0 lb

## 2015-02-14 DIAGNOSIS — E119 Type 2 diabetes mellitus without complications: Secondary | ICD-10-CM

## 2015-02-14 NOTE — Progress Notes (Signed)
Subjective:  Patient presents today for 3 month diabetes follow-up as part of the employer-sponsored Link to Wellness program.  Current diabetes regimen includes metformin 1000 mg BID. Patient also continues on daily ASA, ACE Inhibitor. Patient has a pending appointment to see Dr. Drema Dallas on Wednesday this week. I will defer A1C testing to this appointment.  No med changes at this time.   Patient stopped taking Lipitor because of muscle pain. Since stopping the pain in his arms has improved substantially.   Patient has been under a significant amount of stress of late. His mother was hospitalized and is still in a nursing home. Because of a bedbug and mold infestation he and his family are moving to an apartment and selling their house/land.   Assessment:  Diabetes: Most recent A1C was 6.7  % which is at goal of less than 7%. Weight is stable from last visit with me.   Based on CBG I estimate that A1C has likely increased.    CBG Review: He states that he is checking CBG once daily in the morning.   CBG averages are about 10 points higher than usual. Patient attributes this to stress.   Denies hypoglycemia. High/Low 177/99. Majority of fasting readings in the past 2 weeks have been between 130-140 mg/dL.     Lifestyle improvements:  Physical Activity-  He states that twice a week he walks a few extra laps in at the hospital (works at [employer] as a Solicitor).   One of his previous goals was to get 4000 steps daily. He states that he sometimes gets this.    Nutrition-  He states that his eating habits were off while his mother was in the hospital.  He is trying to eat breakfast more frequently and is getting about 3-4 days with breakfast each week.  B- cottage cheese, fruit, sometimes a pack of nabs if he didn't bring cottage cheese Snack- protein shake or raisin bread/cream cheese; cheddar cheese L- fruit and cottage cheese, a few spoonfuls of PB and graham cracker; sometimes  eating a hamburger at the cafeteria. Sometimes a veggie plate.  D- Eating at [employer] more frequently.   Patient will be cutting back on eating out because his budget will not allow him to eat out daily (like he is now).   He admits that he also has been snacking more frequently and thinks that he is eating too many carbohydrates with meals.     Follow up with me in 3 months.    Plan/Goals for Next Visit:  1. Make an appointment to see the dentist.  2. Submit your weight measurement to [facility].  3. 5 pound weight loss before your next visit. To accomplish this keep your meals to 45-60 grams of carbohydrates with each meal.     Next appointment to see me is: Monday May 8th at 2 PM.    Jinny Blossom D. Donneta Romberg, PharmD, BCPS, CDE [facility] to [place] Coordinator [phone]

## 2015-02-16 DIAGNOSIS — Z9861 Coronary angioplasty status: Secondary | ICD-10-CM | POA: Diagnosis not present

## 2015-02-16 DIAGNOSIS — I10 Essential (primary) hypertension: Secondary | ICD-10-CM | POA: Diagnosis not present

## 2015-02-16 DIAGNOSIS — J329 Chronic sinusitis, unspecified: Secondary | ICD-10-CM | POA: Diagnosis not present

## 2015-02-16 DIAGNOSIS — E7800 Pure hypercholesterolemia, unspecified: Secondary | ICD-10-CM | POA: Diagnosis not present

## 2015-02-16 DIAGNOSIS — Z7984 Long term (current) use of oral hypoglycemic drugs: Secondary | ICD-10-CM | POA: Diagnosis not present

## 2015-02-16 DIAGNOSIS — M545 Low back pain: Secondary | ICD-10-CM | POA: Diagnosis not present

## 2015-02-16 DIAGNOSIS — R7989 Other specified abnormal findings of blood chemistry: Secondary | ICD-10-CM | POA: Diagnosis not present

## 2015-02-16 DIAGNOSIS — E119 Type 2 diabetes mellitus without complications: Secondary | ICD-10-CM | POA: Diagnosis not present

## 2015-02-16 MED FILL — AMOX TR-K CLV 875-125 MG TA: 875-125 | 10 days supply | Qty: 20 | Fill #0

## 2015-02-19 ENCOUNTER — Ambulatory Visit (INDEPENDENT_AMBULATORY_CARE_PROVIDER_SITE_OTHER): Payer: 59

## 2015-02-19 ENCOUNTER — Ambulatory Visit (INDEPENDENT_AMBULATORY_CARE_PROVIDER_SITE_OTHER): Payer: 59 | Admitting: Emergency Medicine

## 2015-02-19 VITALS — BP 132/80 | HR 82 | Temp 97.9°F | Resp 20 | Ht 67.75 in | Wt 377.2 lb

## 2015-02-19 DIAGNOSIS — M79604 Pain in right leg: Secondary | ICD-10-CM

## 2015-02-19 DIAGNOSIS — M47816 Spondylosis without myelopathy or radiculopathy, lumbar region: Secondary | ICD-10-CM | POA: Diagnosis not present

## 2015-02-19 DIAGNOSIS — M545 Low back pain, unspecified: Secondary | ICD-10-CM

## 2015-02-19 LAB — POCT URINALYSIS DIP (MANUAL ENTRY)
BILIRUBIN UA: NEGATIVE
GLUCOSE UA: NEGATIVE
Ketones, POC UA: NEGATIVE
LEUKOCYTES UA: NEGATIVE
NITRITE UA: NEGATIVE
PH UA: 6.5
Protein Ur, POC: NEGATIVE
Spec Grav, UA: 1.02
Urobilinogen, UA: 1

## 2015-02-19 LAB — POC MICROSCOPIC URINALYSIS (UMFC): Mucus: ABSENT

## 2015-02-19 MED ORDER — GABAPENTIN 100 MG PO CAPS: ORAL_CAPSULE | ORAL | Status: DC

## 2015-02-19 MED ORDER — MELOXICAM 15 MG PO TABS: 15.0000 mg | ORAL_TABLET | Freq: Every day | ORAL | Status: DC

## 2015-02-19 NOTE — Patient Instructions (Addendum)
Because you received an x-ray today, you will receive an invoice from [facility]. Please contact [facility] at [phone] with questions or concerns regarding your invoice. Our billing staff will not be able to assist you with those questions. Sciatica Sciatica is pain, weakness, numbness, or tingling along the path of the sciatic nerve. The nerve starts in the lower back and runs down the back of each leg. The nerve controls the muscles in the lower leg and in the back of the knee, while also providing sensation to the back of the thigh, lower leg, and the sole of your foot. Sciatica is a symptom of another medical condition. For instance, nerve damage or certain conditions, such as a herniated disk or bone spur on the spine, pinch or put pressure on the sciatic nerve. This causes the pain, weakness, or other sensations normally associated with sciatica. Generally, sciatica only affects one side of the body. CAUSES   Herniated or slipped disc.  Degenerative disk disease.  A pain disorder involving the narrow muscle in the buttocks (piriformis syndrome).  Pelvic injury or fracture.  Pregnancy.  Tumor (rare). SYMPTOMS  Symptoms can vary from mild to very severe. The symptoms usually travel from the low back to the buttocks and down the back of the leg. Symptoms can include:  Mild tingling or dull aches in the lower back, leg, or hip.  Numbness in the back of the calf or sole of the foot.  Burning sensations in the lower back, leg, or hip.  Sharp pains in the lower back, leg, or hip.  Leg weakness.  Severe back pain inhibiting movement. These symptoms may get worse with coughing, sneezing, laughing, or prolonged sitting or standing. Also, being overweight may worsen symptoms. DIAGNOSIS  Your caregiver will perform a physical exam to look for common symptoms of sciatica. He or she may ask you to do certain movements or activities that would trigger sciatic nerve  pain. Other tests may be performed to find the cause of the sciatica. These may include:  Blood tests.  X-rays.  Imaging tests, such as an MRI or CT scan. TREATMENT  Treatment is directed at the cause of the sciatic pain. Sometimes, treatment is not necessary and the pain and discomfort goes away on its own. If treatment is needed, your caregiver may suggest:  Over-the-counter medicines to relieve pain.  Prescription medicines, such as anti-inflammatory medicine, muscle relaxants, or narcotics.  Applying heat or ice to the painful area.  Steroid injections to lessen pain, irritation, and inflammation around the nerve.  Reducing activity during periods of pain.  Exercising and stretching to strengthen your abdomen and improve flexibility of your spine. Your caregiver may suggest losing weight if the extra weight makes the back pain worse.  Physical therapy.  Surgery to eliminate what is pressing or pinching the nerve, such as a bone spur or part of a herniated disk. HOME CARE INSTRUCTIONS   Only take over-the-counter or prescription medicines for pain or discomfort as directed by your caregiver.  Apply ice to the affected area for 20 minutes, 3-4 times a day for the first 48-72 hours. Then try heat in the same way.  Exercise, stretch, or perform your usual activities if these do not aggravate your pain.  Attend physical therapy sessions as directed by your caregiver.  Keep all follow-up appointments as directed by your caregiver.  Do not wear high heels or shoes that do not provide proper support.  Check your mattress to see if  it is too soft. A firm mattress may lessen your pain and discomfort. SEEK IMMEDIATE MEDICAL CARE IF:   You lose control of your bowel or bladder (incontinence).  You have increasing weakness in the lower back, pelvis, buttocks, or legs.  You have redness or swelling of your back.  You have a burning sensation when you urinate.  You have pain  that gets worse when you lie down or awakens you at night.  Your pain is worse than you have experienced in the past.  Your pain is lasting longer than 4 weeks.  You are suddenly losing weight without reason. MAKE SURE YOU:  Understand these instructions.  Will watch your condition.  Will get help right away if you are not doing well or get worse.   This information is not intended to replace advice given to you by your health care provider. Make sure you discuss any questions you have with your health care provider.   Document Released: 12/26/2000 Document Revised: 09/22/2014 Document Reviewed: 05/13/2011 Elsevier Interactive Patient Education [employer].

## 2015-02-19 NOTE — Progress Notes (Signed)
By signing my name below, I, Travis [name], attest that this documentation has been prepared under the direction and in the presence of Travis Queen, MD. Electronically Signed: Moises [name], [place]. 02/19/2015 , 10:49 AM .  Patient was seen in room 4 .  Chief Complaint:  Chief Complaint  Patient presents with  . Hip Pain    x1 week ago; been taking OTC ibuprofen; on the right side and the pain is radiating to the left  . Back Pain    x1 week ago;  above the hip/side area    HPI: Travis [name] is a 60 y.o. male who reports to [facility] today complaining of hip and back pain that started a week ago.  His mother is in the hospital for congestive heart failure and he was visiting her. As he turned to talk to her, he felt a "twinge" in his lower back. Throughout the week, the "twinge" gradually increased and worsened. He's taken OTC ibuprofen with some temporary relief. About 3 days ago, he saw his PCP for sinus problems, and he was treated with antibiotics. He mentioned the back pain and was suggested to take tylenol.   He also mentions having hernia for 20 years. He has usual pain on his right hip but now the pain has been radiating towards his left hip.   He works at [employer] for [employer]. It's a physical job.   Past Medical History  Diagnosis Date  . Prostate cancer ([place]) 2012  . Frequent unifocal PVCs   . Essential hypertension, benign   . Morbid obesity ([place])   . History of MI (myocardial infarction) 1997    Angioplasty  . Diverticulosis of colon with hemorrhage 2009  . [name] in stool   . Myalgia and myositis, unspecified   . Sleep apnea with use of continuous positive airway pressure (CPAP)   . Edema   . Family history of thyroid disease   . Encounter for long-term (current) use of other medications   . Type II or unspecified type diabetes mellitus without mention of complication, not stated as uncontrolled   . Appendicitis   . Coronary artery disease 1997      s/p PCI by Dr. Glade Lloyd  . Dyslipidemia, goal LDL below 70   . Dyslipidemia 04/27/2014  . Thrombocytopenia ([place]) 05/25/2014   Past Surgical History  Procedure Laterality Date  . Appendectomy    . Angioplasty  1997    Dr. Glade Lloyd   Social History   Social History  . Marital Status: Married    Spouse Name: N/A  . Number of Children: N/A  . Years of Education: N/A   Occupational History  . [employer]    mod/heavy lifting   Social History Main Topics  . Smoking status: Never Smoker   . Smokeless tobacco: Not on file  . Alcohol Use: No  . Drug Use: No  . Sexual Activity: Not on file   Other Topics Concern  . Not on file   Social History Narrative   Family History  Problem Relation Age of Onset  . CVA Father   . Hypertension Father   . Diabetes Mother     DM  . Hypothyroidism Sister   . Hypertension Sister    Allergies  Allergen Reactions  . Crestor [Rosuvastatin]     Muscle Pain    Prior to Admission medications   Medication Sig Start Date End Date Taking? Authorizing Provider  amLODipine (NORVASC) 10 MG  tablet Take 10 mg by mouth daily.   Yes Historical Provider, MD  aspirin 81 MG tablet Take 1 tablet (81 mg total) by mouth daily. 04/23/13  Yes Sueanne Margarita, MD  fenofibrate micronized (LOFIBRA) 200 MG capsule Take 200 mg by mouth daily before breakfast.    Yes Historical Provider, MD  furosemide (LASIX) 20 MG tablet Take 20-40 mg by mouth daily.    Yes Historical Provider, MD  ibuprofen (ADVIL,MOTRIN) 200 MG tablet Take 200 mg by mouth as needed.   Yes Historical Provider, MD  lisinopril (PRINIVIL,ZESTRIL) 40 MG tablet Take 40 mg by mouth daily.   Yes Historical Provider, MD  Magnesium 250 MG TABS Take 1 tablet by mouth daily.   Yes Historical Provider, MD  metFORMIN (GLUCOPHAGE) 500 MG tablet Take 1,000 mg by mouth 2 (two) times daily with a meal.    Yes Historical Provider, MD  metoprolol succinate (TOPROL-XL) 100 MG 24 hr tablet TAKE 1 TABLET BY  MOUTH ONCE DAILY WITH OR IMMEDIATELY FOLLOWING A MEAL 06/16/14  Yes Thompson Grayer, MD  Omega-3 Fatty Acids (SALMON OIL-1000 PO) Take 2 capsules by mouth 2 (two) times daily.   Yes Historical Provider, MD  zinc gluconate 50 MG tablet Take 50 mg by mouth daily.   Yes Historical Provider, MD     ROS:  Constitutional: negative for fever, chills, night sweats, weight changes, or fatigue  HEENT: negative for vision changes, hearing loss, congestion, rhinorrhea, ST, epistaxis, or sinus pressure Cardiovascular: negative for chest pain or palpitations Respiratory: negative for hemoptysis, wheezing, shortness of breath, or cough Abdominal: negative for abdominal pain, nausea, vomiting, diarrhea, or constipation Dermatological: negative for rash Musc: positive for back pain, hip pain Neurologic: negative for headache, dizziness, or syncope All other systems reviewed and are otherwise negative with the exception to those above and in the HPI.  PHYSICAL EXAM: Filed Vitals:   02/19/15 1018  BP: 132/80  Pulse: 82  Temp: 97.9 F (36.6 C)  Resp: 20   Body mass index is 57.77 kg/(m^2).   General: Alert, no acute distress HEENT:  Normocephalic, atraumatic, oropharynx patent. Eye: [facility] Cardiovascular:  Regular rate and rhythm, no rubs murmurs or gallops.  No Carotid bruits, radial pulse intact. No pedal edema.  Respiratory: Clear to auscultation bilaterally.  No wheezes, rales, or rhonchi.  No cyanosis, no use of accessory musculature Abdominal: No organomegaly, abdomen is soft and non-tender, positive bowel sounds. has a very large panniculus and very large associated right sided hernia with thickened skin over the hernia side Musculoskeletal: Gait intact. No edema, Tenderness over lower L-spine on right side Skin: No rashes. Neurologic: Facial musculature symmetric. reflexes lower extr 2+ Psychiatric: Patient acts appropriately throughout our interaction.  Lymphatic: No cervical or  submandibular lymphadenopathy Genitourinary/Anorectal: No acute findings  LABS:   EKG/XRAY:   Primary read interpreted by Dr. Everlene Farrier at [facility]. Dg Lumbar Spine 2-3 Views  02/19/2015  CLINICAL DATA:  Complaining of back pain that started 1 week ago. EXAM: LUMBAR SPINE - 2-3 VIEW COMPARISON:  None. FINDINGS: Two views of the lumbar spine were obtained. Multilevel degenerative endplate and facet disease. Overall alignment of the lumbar spine is within normal limits. Disc space narrowing at T12-L1. Vertebral body heights are maintained. Probable disc space narrowing at L5-S1 but limited evaluation of this examination. IMPRESSION: Multilevel degenerative changes.  No evidence for a fracture. Electronically Signed   By: Markus Daft M.D.   On: 02/19/2015 11:30   Results for orders placed  or performed in visit on 02/19/15  POCT urinalysis dipstick  Result Value Ref Range   Color, UA yellow yellow   Clarity, UA clear clear   Glucose, UA negative negative   Bilirubin, UA negative negative   Ketones, POC UA negative negative   Spec Grav, UA 1.020    [name], UA trace-intact (A) negative   pH, UA 6.5    Protein Ur, POC negative negative   Urobilinogen, UA 1.0    Nitrite, UA Negative Negative   Leukocytes, UA Negative Negative  POCT Microscopic Urinalysis ([facility])  Result Value Ref Range   WBC,UR,HPF,POC None None WBC/hpf   RBC,UR,HPF,POC None None RBC/hpf   Bacteria None None, Too numerous to count   Mucus Absent Absent   Epithelial Cells, UR Per Microscopy None None, Too numerous to count cells/hpf    ASSESSMENT/PLAN: Patient has multilevel degenerative changes of the endplates and facets. There is degenerative disc disease at L5-S1. Patient placed on Moban one a day along with Neurontin 1 tablet twice a day 2 tablets at night.I personally performed the services described in this documentation, which was scribed in my presence. The recorded information has been reviewed and is accurate.  Gross  sideeffects, risk and benefits, and alternatives of medications d/w patient. Patient is aware that all medications have potential sideeffects and we are unable to predict every sideeffect or drug-drug interaction that may occur.  Travis Queen MD 02/19/2015 11:01 AM

## 2015-02-21 NOTE — Progress Notes (Signed)
I have reviewed this pharmacist's note and agree  

## 2015-02-24 DIAGNOSIS — M545 Low back pain: Secondary | ICD-10-CM | POA: Diagnosis not present

## 2015-03-07 MED FILL — AMOX TR-K CLV 875-125 MG TA: 875-125 | 10 days supply | Qty: 20 | Fill #0

## 2015-03-08 MED FILL — GABAPENTIN 100 MG CAPSULE: 100 | 23 days supply | Qty: 90 | Fill #0

## 2015-03-08 MED FILL — MELOXICAM 15 MG TABLET: 15 | 30 days supply | Qty: 30 | Fill #0

## 2015-03-14 MED FILL — metFORMIN HCL 500 MG TABS: 500 | 90 days supply | Qty: 360 | Fill #0

## 2015-03-14 MED FILL — TRUE METRIX GLUCOSE TEST ST: 90 days supply | Qty: 400 | Fill #0

## 2015-03-14 MED FILL — SM ALCOHOL 70% PREP PADS: 70 | 90 days supply | Qty: 400 | Fill #0

## 2015-03-14 MED FILL — LISINOPRIL 40 MG TABLET: 40 | 90 days supply | Qty: 90 | Fill #0

## 2015-03-14 MED FILL — METOPROLOL SUCC ER 100 MG T: 100 | 90 days supply | Qty: 90 | Fill #3

## 2015-03-14 MED FILL — FUROSEMIDE 20 MG TABLET: 20 | 45 days supply | Qty: 90 | Fill #0

## 2015-03-14 MED FILL — AMLODIPINE BESYLATE 10 MG T: 10 | 90 days supply | Qty: 90 | Fill #2

## 2015-03-14 MED FILL — TRUEplus LANCETS 30G MISC: 90 days supply | Qty: 400 | Fill #0

## 2015-03-14 MED FILL — FENOFIBRATE 200 MG CAPSULE: 200 | 90 days supply | Qty: 90 | Fill #0

## 2015-03-31 DIAGNOSIS — N281 Cyst of kidney, acquired: Secondary | ICD-10-CM | POA: Diagnosis not present

## 2015-03-31 DIAGNOSIS — N323 Diverticulum of bladder: Secondary | ICD-10-CM | POA: Diagnosis not present

## 2015-03-31 DIAGNOSIS — K429 Umbilical hernia without obstruction or gangrene: Secondary | ICD-10-CM | POA: Diagnosis not present

## 2015-03-31 DIAGNOSIS — K432 Incisional hernia without obstruction or gangrene: Secondary | ICD-10-CM | POA: Diagnosis not present

## 2015-03-31 DIAGNOSIS — K760 Fatty (change of) liver, not elsewhere classified: Secondary | ICD-10-CM | POA: Diagnosis not present

## 2015-04-06 ENCOUNTER — Telehealth: Payer: Self-pay | Admitting: Internal Medicine

## 2015-04-06 DIAGNOSIS — G4733 Obstructive sleep apnea (adult) (pediatric): Secondary | ICD-10-CM

## 2015-04-06 NOTE — Telephone Encounter (Signed)
Ok order- [facility]- replacement for old CPAP machine, current presure, mask of chice, humidifier, supplies, AirView,    Dx OSA

## 2015-04-06 NOTE — Telephone Encounter (Signed)
Spoke with the pt He is requesting an order to be sent to [facility] for a new CPAP  His current machine is over 60 y/o Please advise thanks!

## 2015-04-06 NOTE — Telephone Encounter (Signed)
Order was sent to [facility]  Pt aware and nothing further needed

## 2015-04-08 ENCOUNTER — Telehealth: Payer: Self-pay | Admitting: Internal Medicine

## 2015-04-08 DIAGNOSIS — G4733 Obstructive sleep apnea (adult) (pediatric): Secondary | ICD-10-CM

## 2015-04-08 NOTE — Telephone Encounter (Signed)
Spoke with Melissa with [facility] is aware that I am placing order in EP[facility] with current pressure from when we seen patient in Jan and no changes in pressure since then. Nothing more needed at this time.

## 2015-04-13 DIAGNOSIS — L909 Atrophic disorder of skin, unspecified: Secondary | ICD-10-CM | POA: Diagnosis not present

## 2015-04-13 DIAGNOSIS — I890 Lymphedema, not elsewhere classified: Secondary | ICD-10-CM | POA: Diagnosis not present

## 2015-04-18 DIAGNOSIS — R0689 Other abnormalities of breathing: Secondary | ICD-10-CM | POA: Diagnosis not present

## 2015-04-18 DIAGNOSIS — G4733 Obstructive sleep apnea (adult) (pediatric): Secondary | ICD-10-CM | POA: Diagnosis not present

## 2015-04-27 DIAGNOSIS — Z8546 Personal history of malignant neoplasm of prostate: Secondary | ICD-10-CM | POA: Diagnosis not present

## 2015-05-04 DIAGNOSIS — Z0000 Encounter for general adult medical examination without abnormal findings: Secondary | ICD-10-CM | POA: Diagnosis not present

## 2015-05-04 DIAGNOSIS — Z8546 Personal history of malignant neoplasm of prostate: Secondary | ICD-10-CM | POA: Diagnosis not present

## 2015-05-16 ENCOUNTER — Encounter (INDEPENDENT_AMBULATORY_CARE_PROVIDER_SITE_OTHER): Payer: Self-pay

## 2015-05-16 ENCOUNTER — Encounter: Payer: Self-pay | Admitting: Internal Medicine

## 2015-05-16 ENCOUNTER — Ambulatory Visit (INDEPENDENT_AMBULATORY_CARE_PROVIDER_SITE_OTHER): Payer: 59 | Admitting: Internal Medicine

## 2015-05-16 VITALS — BP 120/62 | HR 73

## 2015-05-16 DIAGNOSIS — G4733 Obstructive sleep apnea (adult) (pediatric): Secondary | ICD-10-CM

## 2015-05-16 NOTE — Patient Instructions (Signed)
We can continue CPAP 16/ [facility]  You are doing great.      Be sure to call if we can help.

## 2015-05-16 NOTE — Progress Notes (Signed)
10/07/14- 60 yoM never smoker referred by Dr [name] for OSA, complicated by morbid obesity, CAD/ MI/ PTCA, HBP, DM Former patient-[place] chest patient; DME: [facility]; current CPAP on 12 and machine is over 59 years old; sleep study over 10 years ago. Needs to update sleep study to replace very old machine and supplies. He is using his second machine but it is old and in bad repair, pressure 12, used all night every night. Bedtime between 5 and 6 PM, getting up to start his day at 2 AM because he starts work at [employer] AM working in [employer] at [place]. No ENT surgery and no history of lung disease. Coronary artery disease with history of MI reviewed.  02/07/2015-60 year old male never smoker followed for OSA, complicated by morbid obesity, CAD/MI/PTCA, HBP, DM CPAP 12/[facility] wearing cpap nightly for average of 5-7 hrs.  No problems with pressure.  Has daytime sleepiness.   NPSG  10/14/14 AHI 124.2/ hr, CPAP to 16, desat to 69%, weight 390 lbs We reviewed his sleep study and health implications. We will increase CPAP to 16 for better control He has a massive right inguinal hernia being evaluated for surgical repair in [place].  05/16/2015-60 year old male never smoker followed for OSA, complicated by morbid obesity, CAD/MI/PTCA, HBP, DM, massive hernia CPAP 16/[facility] FOLLOW FOR:  Sleep Apnea, doing well on CPAP machine, uses Nasal pillows.  no complaints.    ROS-see HPI   Negative unless "+" Constitutional:    weight loss, night sweats, fevers, chills, fatigue, lassitude. HEENT:    headaches, difficulty swallowing, tooth/dental problems, sore throat,       sneezing, itching, ear ache, + nasal congestion, post nasal drip, snoring CV:    chest pain, orthopnea, PND, swelling in lower extremities, anasarca,                                                       dizziness, + palpitations Resp:   + shortness of breath with exertion or at rest.                productive cough,   non-productive  cough, coughing up of blood.              change in color of mucus.  wheezing.   Skin:    rash or lesions. GI:  No-   heartburn, indigestion, abdominal pain, nausea, vomiting, diarrhea,                 change in bowel habits, loss of appetite GU: dysuria, change in color of urine, no urgency or frequency.   flank pain. MS:  + joint pain, stiffness, decreased range of motion, back pain. Neuro-     nothing unusual Psych:  change in mood or affect.  depression or anxiety.   memory loss.  OBJ- Physical Exam General- Alert, Oriented, Affect-appropriate, Distress- none acute, + morbidly obese Skin- rash-none, lesions- none, excoriation- none Lymphadenopathy- none Head- atraumatic            Eyes- Gross vision intact, PERRLA, conjunctivae and secretions clear            Ears- Hearing, canals-normal            Nose- Clear, no-Septal dev, mucus, polyps, erosion, perforation             Throat-  Mallampati IV , mucosa clear , drainage- none, tonsils- atrophic Neck- flexible , trachea midline, no stridor , thyroid nl, carotid no bruit Chest - symmetrical excursion , unlabored           Heart/CV- RRR , no murmur , no gallop  , no rub, nl s1 s2                           - JVD- none , edema- none, stasis changes- none, varices- none           Lung- clear to P&A, wheeze- none, cough- none , dullness-none, rub- none           Chest wall-  Abd- + massive right inguinal hernia and significant obesity Br/ Gen/ Rectal- Not done, not indicated Extrem- cyanosis- none, clubbing, none, atrophy- none, strength- nl Neuro- grossly intact to observation

## 2015-05-18 DIAGNOSIS — R0689 Other abnormalities of breathing: Secondary | ICD-10-CM | POA: Diagnosis not present

## 2015-05-18 DIAGNOSIS — G4733 Obstructive sleep apnea (adult) (pediatric): Secondary | ICD-10-CM | POA: Diagnosis not present

## 2015-05-23 ENCOUNTER — Other Ambulatory Visit: Payer: Self-pay | Admitting: Pharmacist

## 2015-05-23 ENCOUNTER — Encounter: Payer: Self-pay | Admitting: Pharmacist

## 2015-05-23 VITALS — BP 100/62 | Ht 69.5 in | Wt 383.0 lb

## 2015-05-23 DIAGNOSIS — E119 Type 2 diabetes mellitus without complications: Secondary | ICD-10-CM

## 2015-05-23 LAB — POCT GLYCOSYLATED HEMOGLOBIN (HGB A1C): Hemoglobin A1C: 7.1

## 2015-05-23 NOTE — Patient Outreach (Signed)
Subjective:  Patient presents today for 3 month diabetes follow-up as part of the employer-sponsored Link to Wellness program.  Current diabetes regimen includes metformin 1000 mg BID.  Patient also continues on daily ASA and ACE Inhibitor.  Most recent MD follow-up was with Dr. Drema Dallas in January. A1C at that visit was 6.9%.  Patient has a pending appt for August 14th to see Dr. Drema Dallas.  No med changes or major health changes at this time.   Patient has moved since his last appointment and is now sharing an apartment with his elderly mother who needs around the clock care. He reports that his routine has been thrown off and he is not doing some of the things that he was doing previously.   He met with a Psychiatric nurse in [place] in April regarding the removal of extra skin/tissue/fluid in his front section. He is awaiting the scheduling of the surgery.   Assessment:  Diabetes: Most recent A1C today was 7.1   % which is slightly exceeding goal of less than 7%. Weight is increased from last visit with me.    CBG Review: Patient is checking CBG once daily, fasting.   CBGs are about 10-15 points higher than they were at last visit. CBGs in the last week are higher, likely secondary to sinus drainage and a cold he has been dealing with. Fasting CBGs in the last few weeks are ranging 130-180.   Denies hypoglycemia.   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 He is usually doing this three times a week. He is no longer wearing his fitbit but thinks he is still getting the same amount of physical activity.    Nutrition-  He reports that the progress he has made in this area has slipped since moving. He is only eating breakfast a few times a week.   B- (if he eats) scrambled egg, few strips of bacon; water  Snack/lunchtime- pack of nabs, diet soda Dinner- eating 3-4 times a week. He states that he  doesn't care to eat because he is not hungry.    He has cut back on eating out and he is only eating out once a week. This is a decrease from his last visit with me. He reports that he has been cooking and preparing more of his meals lately.   Follow up with me in 3 months.    Plan/Goals for Next Visit:  1. Make an appointment to see the dentist.  2. Get back into the habit of walking an extra 5-10 minutes around the hospital on the days that you are working. Aim for 5 days a week of walking.     Next appointment to see me is: Monday August 14th at 1:30 PM.    Truett Mainland. Donneta Romberg, PharmD, BCPS, CDE [place] Parcel to [place] Coordinator [phone]

## 2015-06-15 MED FILL — FENOFIBRATE 200 MG CAPSULE: 200 | 90 days supply | Qty: 90 | Fill #1

## 2015-06-15 MED FILL — AMLODIPINE BESYLATE 10 MG T: 10 | 90 days supply | Qty: 90 | Fill #0

## 2015-06-15 MED FILL — METOPROLOL SUCC ER 100 MG T: 100 | 30 days supply | Qty: 30 | Fill #4

## 2015-06-15 MED FILL — SM ALCOHOL 70% PREP PADS: 70 | 90 days supply | Qty: 400 | Fill #1

## 2015-06-15 MED FILL — FUROSEMIDE 20 MG TABLET: 20 | 45 days supply | Qty: 90 | Fill #1

## 2015-06-15 MED FILL — metFORMIN HCL 500 MG TABS: 500 | 90 days supply | Qty: 360 | Fill #1

## 2015-06-15 MED FILL — LISINOPRIL 40 MG TABLET: 40 | 90 days supply | Qty: 90 | Fill #1

## 2015-06-15 MED FILL — TRUEplus LANCETS 30G MISC: 90 days supply | Qty: 400 | Fill #1

## 2015-06-15 MED FILL — TRUE METRIX GLUCOSE TEST ST: 90 days supply | Qty: 400 | Fill #1

## 2015-06-18 DIAGNOSIS — G4733 Obstructive sleep apnea (adult) (pediatric): Secondary | ICD-10-CM | POA: Diagnosis not present

## 2015-06-18 DIAGNOSIS — R0689 Other abnormalities of breathing: Secondary | ICD-10-CM | POA: Diagnosis not present

## 2015-06-24 ENCOUNTER — Other Ambulatory Visit: Payer: Self-pay | Admitting: Internal Medicine

## 2015-07-18 DIAGNOSIS — R0689 Other abnormalities of breathing: Secondary | ICD-10-CM | POA: Diagnosis not present

## 2015-07-18 DIAGNOSIS — G4733 Obstructive sleep apnea (adult) (pediatric): Secondary | ICD-10-CM | POA: Diagnosis not present

## 2015-07-21 DIAGNOSIS — R0689 Other abnormalities of breathing: Secondary | ICD-10-CM | POA: Diagnosis not present

## 2015-08-29 ENCOUNTER — Other Ambulatory Visit: Payer: Self-pay | Admitting: Pharmacist

## 2015-08-29 ENCOUNTER — Encounter: Payer: Self-pay | Admitting: Pharmacist

## 2015-08-29 ENCOUNTER — Ambulatory Visit: Payer: Self-pay | Admitting: Pharmacist

## 2015-08-29 VITALS — BP 110/58 | Wt 376.0 lb

## 2015-08-29 DIAGNOSIS — E65 Localized adiposity: Secondary | ICD-10-CM | POA: Diagnosis not present

## 2015-08-29 DIAGNOSIS — R945 Abnormal results of liver function studies: Secondary | ICD-10-CM | POA: Diagnosis not present

## 2015-08-29 DIAGNOSIS — I10 Essential (primary) hypertension: Secondary | ICD-10-CM | POA: Diagnosis not present

## 2015-08-29 DIAGNOSIS — I2510 Atherosclerotic heart disease of native coronary artery without angina pectoris: Secondary | ICD-10-CM | POA: Diagnosis not present

## 2015-08-29 DIAGNOSIS — E119 Type 2 diabetes mellitus without complications: Secondary | ICD-10-CM

## 2015-08-29 DIAGNOSIS — E7800 Pure hypercholesterolemia, unspecified: Secondary | ICD-10-CM | POA: Diagnosis not present

## 2015-08-29 DIAGNOSIS — F329 Major depressive disorder, single episode, unspecified: Secondary | ICD-10-CM | POA: Diagnosis not present

## 2015-08-29 DIAGNOSIS — Z7984 Long term (current) use of oral hypoglycemic drugs: Secondary | ICD-10-CM | POA: Diagnosis not present

## 2015-08-29 LAB — CBC AND DIFFERENTIAL
HEMATOCRIT: 39 % — AB (ref 41–53)
HEMOGLOBIN: 13 g/dL — AB (ref 13.5–17.5)
Neutrophils Absolute: 4 /uL
Platelets: 98 10*3/uL — AB (ref 150–399)
WBC: 6.2 10^3/mL

## 2015-08-29 LAB — BASIC METABOLIC PANEL
BUN: 21 mg/dL (ref 4–21)
Creatinine: 0.6 mg/dL (ref 0.6–1.3)
Glucose: 121 mg/dL
POTASSIUM: 4.1 mmol/L (ref 3.4–5.3)
SODIUM: 139 mmol/L (ref 137–147)

## 2015-08-29 LAB — HEPATIC FUNCTION PANEL: BILIRUBIN, TOTAL: 0.5 mg/dL

## 2015-08-29 LAB — TSH: TSH: 1.28 u[IU]/mL (ref 0.41–5.90)

## 2015-08-29 MED FILL — SERTRALINE HCL 25 MG TABLET: 25 | 30 days supply | Qty: 30 | Fill #0

## 2015-08-29 NOTE — Patient Outreach (Signed)
Subjective:  Patient presents today for 3 month diabetes follow-up as part of the employer-sponsored Link to Wellness program.  Current diabetes regimen includes metformin 1000 mg BID.  Patient also continues on daily ASA, ACE Inhibitor.  Most recent MD follow-up was with Dr. Drema Dallas today. A1C was drawn today but he has not heard of the result yet.  Patient has a pending appt for next month with Dr. Drema Dallas.  No major health changes at this time.   One medication change- sertraline 25 mg daily was started today. Patient's mother died almost 3 months ago and patient has reported increased depressive symptoms. Counseled patient on medication and encouraged him to keep his appointment with Dr. Drema Dallas in a month for follow up. I explained that this medication may take up to 6-8 weeks to fully exert its effect.   Patient has been meeting with a surgeon in [place] to remove pantus from patient's front. Insurance has denied this. He is upset that insurance does not want to pay for this.     Assessment:  Diabetes: Most recent A1C was 6.7 % which is at goal of less than 7%. Weight is decreased from last visit with me.    CBG Review: Patient is checking blood sugar once daily (usually- sometimes he is forgetting). He estimates that he is checking at least 3-4 times a week fasting. CBG averages are similar to last visit.   Patient denies hypoglycemia.   High/Low- 188/110. Majority of the readings are in the mid 100s.      Lifestyle improvements:  Physical Activity-  A few times a week he is walking extra at the hospital. He is walking 10 minutes.    Nutrition-  Patient has gone back to skipping breakfast most days. He estimates that he is eating breakfast twice a week. Not eating lunch until he is done with work (working 4:30 AM- 1 PM). Most days he eats lunch. He is eating dinner about twice a week. Skipping meals has been a problem for him on and off. I encouraged him to eat something for  breakfast and to not skip meals.   B- when he does eat he is eating 3-4 slices of bacon, a few slices of bread L- grilled chicken, hamburger. He is eating out 3 times a week (he has cut back on [place], but he is now going to [place] and getting grilled chicken, rice, zuchinni; burger spot in [place]. Sometimes cooking things in the crockpot or making a grilled ham and cheese sandwich at home.    Follow up with me in 5 months when I return from maternity leave.    Plan/Goals for Next Visit:  1. Work with your surgeon's office and your insurance company to figure out what you will need to do in order to have the surgery to remove the extra skin and fluid.  2. Start eating something in the morning for breakfast. Ideas- cottage cheese and fruit, [employer] or Glucerna shakes are fine too.     Next appointment to see me is: Friday January 5th at 2:30 PM.    Jinny Blossom D. Donneta Romberg, PharmD, BCPS, CDE Norm Parcel to [place] Coordinator [phone]

## 2015-09-12 MED FILL — FENOFIBRATE 200 MG CAPSULE: 200 | 90 days supply | Qty: 90 | Fill #0

## 2015-09-12 MED FILL — TRUE METRIX GLUCOSE TEST ST: 90 days supply | Qty: 400 | Fill #0

## 2015-09-12 MED FILL — SM ALCOHOL 70% PREP PADS: 70 | 90 days supply | Qty: 400 | Fill #0

## 2015-09-12 MED FILL — LISINOPRIL 40 MG TABLET: 40 | 90 days supply | Qty: 90 | Fill #0

## 2015-09-12 MED FILL — TRUEplus LANCETS 30G MISC: 90 days supply | Qty: 400 | Fill #0

## 2015-09-12 MED FILL — METOPROLOL SUCC ER 100 MG T: 100 | 30 days supply | Qty: 30 | Fill #0

## 2015-09-12 MED FILL — AMLODIPINE BESYLATE 10 MG T: 10 | 90 days supply | Qty: 90 | Fill #0

## 2015-09-12 MED FILL — FUROSEMIDE 20 MG TABLET: 20 | 45 days supply | Qty: 90 | Fill #0

## 2015-09-12 MED FILL — metFORMIN HCL 500 MG TABS: 500 | 90 days supply | Qty: 360 | Fill #0

## 2015-09-17 ENCOUNTER — Encounter (HOSPITAL_COMMUNITY): Payer: Self-pay | Admitting: Emergency Medicine

## 2015-09-17 ENCOUNTER — Ambulatory Visit (HOSPITAL_COMMUNITY)
Admission: EM | Admit: 2015-09-17 | Discharge: 2015-09-17 | Disposition: A | Discharge status: Home / Self Care | Payer: 59 | Attending: Physician Assistant | Admitting: Physician Assistant

## 2015-09-17 DIAGNOSIS — R630 Anorexia: Secondary | ICD-10-CM | POA: Diagnosis not present

## 2015-09-17 LAB — POCT I-STAT, CHEM 8
BUN: 17 mg/dL (ref 6–20)
CALCIUM ION: 1.16 mmol/L (ref 1.15–1.40)
CHLORIDE: 95 mmol/L — AB (ref 101–111)
CREATININE: 0.8 mg/dL (ref 0.61–1.24)
Glucose, Bld: 155 mg/dL — ABNORMAL HIGH (ref 65–99)
HEMATOCRIT: 39 % (ref 39.0–52.0)
Hemoglobin: 13.3 g/dL (ref 13.0–17.0)
Potassium: 4.2 mmol/L (ref 3.5–5.1)
SODIUM: 135 mmol/L (ref 135–145)
TCO2: 28 mmol/L (ref 0–100)

## 2015-09-17 NOTE — ED Provider Notes (Signed)
CSN: [id number]     Arrival date & time 09/17/15  1350 History   First MD Initiated Contact with Patient 09/17/15 1452     Chief Complaint  Patient presents with  . Anorexia   (Consider location/radiation/quality/duration/timing/severity/associated sxs/prior Treatment) HPI 60 y/o male anorexia for the last week. No abdominal pain, nausea. just does not feel hungry.  Past Medical History:  Diagnosis Date  . Appendicitis   . Blood in stool   . Coronary artery disease 1997   s/p PCI by Dr. Glade Lloyd  . Diverticulosis of colon with hemorrhage 2009  . Dyslipidemia 04/27/2014  . Dyslipidemia, goal LDL below 70   . Edema   . Encounter for long-term (current) use of other medications   . Essential hypertension, benign   . Family history of thyroid disease   . Frequent unifocal PVCs   . History of MI (myocardial infarction) 1997   Angioplasty  . Morbid obesity ([place])   . Myalgia and myositis, unspecified   . Prostate cancer ([place]) 2012  . Sleep apnea with use of continuous positive airway pressure (CPAP)   . Thrombocytopenia ([place]) 05/25/2014  . Type II or unspecified type diabetes mellitus without mention of complication, not stated as uncontrolled    Past Surgical History:  Procedure Laterality Date  . ANGIOPLASTY  1997   Dr. Glade Lloyd  . APPENDECTOMY     Family History  Problem Relation Age of Onset  . CVA Father   . Hypertension Father   . Diabetes Mother     DM  . Hypothyroidism Sister   . Hypertension Sister    Social History  Substance Use Topics  . Smoking status: Never Smoker  . Smokeless tobacco: Never Used  . Alcohol use No    Review of Systems  Denies: HEADACHE, NAUSEA, ABDOMINAL PAIN, CHEST PAIN, CONGESTION, DYSURIA, SHORTNESS OF BREATH  Allergies  Crestor [rosuvastatin]  Home Medications   Prior to Admission medications   Medication Sig Start Date End Date Taking? Authorizing Provider  amLODipine (NORVASC) 10 MG tablet Take 10 mg by mouth daily.     Historical Provider, MD  aspirin 81 MG tablet Take 1 tablet (81 mg total) by mouth daily. 04/23/13   Sueanne Margarita, MD  b complex vitamins tablet Take 1 tablet by mouth daily.    Historical Provider, MD  fenofibrate micronized (LOFIBRA) 200 MG capsule Take 200 mg by mouth daily before breakfast.     Historical Provider, MD  furosemide (LASIX) 20 MG tablet Take 20-40 mg by mouth daily as needed for edema (EDEMA).     Historical Provider, MD  ibuprofen (ADVIL,MOTRIN) 200 MG tablet Take 200 mg by mouth as needed.    Historical Provider, MD  lisinopril (PRINIVIL,ZESTRIL) 40 MG tablet Take 40 mg by mouth daily.    Historical Provider, MD  Magnesium 250 MG TABS Take 1 tablet by mouth daily.    Historical Provider, MD  metFORMIN (GLUCOPHAGE) 500 MG tablet Take 1,000 mg by mouth 2 (two) times daily with a meal.     Historical Provider, MD  metoprolol succinate (TOPROL-XL) 100 MG 24 hr tablet TAKE 1 TABLET BY MOUTH ONCE DAILY WITH OR IMMEDIATELY FOLLOWING A MEAL 06/24/15   Thompson Grayer, MD  Omega-3 Fatty Acids (SALMON OIL-1000 PO) Take 2 capsules by mouth 2 (two) times daily.    Historical Provider, MD  sertraline (ZOLOFT) 25 MG tablet Take 25 mg by mouth daily.    Historical Provider, MD  vitamin C (ASCORBIC ACID) 500 MG  tablet Take 500 mg by mouth daily.    Historical Provider, MD  zinc gluconate 50 MG tablet Take 50 mg by mouth daily.    Historical Provider, MD   Meds Ordered and Administered this Visit  Medications - No data to display  BP 135/68 (BP Location: Left Arm)   Pulse 99   Temp 99.8 F (37.7 C) (Oral)   Resp 16   SpO2 100%  No data found.   Physical Exam NURSES NOTES AND VITAL SIGNS REVIEWED. CONSTITUTIONAL: Well developed, well nourished, no acute distress HEENT: normocephalic, atraumatic EYES: Conjunctiva normal NECK:normal ROM, supple, no adenopathy PULMONARY:No respiratory distress, normal effort ABDOMINAL: Soft, ND, NT BS+, No CVAT MUSCULOSKELETAL: Normal ROM of all  extremities,  SKIN: warm and dry without rash PSYCHIATRIC: Mood and affect, behavior are normal  Urgent Care Course   Clinical Course    Procedures (including critical care time)  Labs Review Labs Reviewed  POCT I-STAT, CHEM 8 - Abnormal; Notable for the following:       Result Value   Chloride 95 (*)    Glucose, Bld 155 (*)    All other components within normal limits    Imaging Review No results found.   Visual Acuity Review  Right Eye Distance:   Left Eye Distance:   Bilateral Distance:    Right Eye Near:   Left Eye Near:    Bilateral Near:         MDM   1. Anorexia     Patient is reassured that there are no issues that require transfer to higher level of care at this time or additional tests. Patient is advised to continue home symptomatic treatment. Patient is advised that if there are new or worsening symptoms to attend the emergency department, contact primary care provider, or return to UC. Instructions of care provided discharged home in stable condition.    THIS NOTE WAS GENERATED USING A VOICE RECOGNITION SOFTWARE PROGRAM. ALL REASONABLE EFFORTS  WERE MADE TO PROOFREAD THIS DOCUMENT FOR ACCURACY.  I have verbally reviewed the discharge instructions with the patient. A printed AVS was given to the patient.  All questions were answered prior to discharge.      Konrad Felix, PA 09/17/15 2052

## 2015-09-17 NOTE — ED Triage Notes (Addendum)
The patient presented to the [facility] with a complaint of a loss of appetite and not eating for 1 week. The patient reported no abdominal pain and normal bowel movements.

## 2015-10-03 DIAGNOSIS — D696 Thrombocytopenia, unspecified: Secondary | ICD-10-CM | POA: Diagnosis not present

## 2015-10-03 DIAGNOSIS — I2510 Atherosclerotic heart disease of native coronary artery without angina pectoris: Secondary | ICD-10-CM | POA: Diagnosis not present

## 2015-10-03 DIAGNOSIS — K654 Sclerosing mesenteritis: Secondary | ICD-10-CM | POA: Diagnosis not present

## 2015-10-03 DIAGNOSIS — I4891 Unspecified atrial fibrillation: Secondary | ICD-10-CM | POA: Diagnosis not present

## 2015-10-03 DIAGNOSIS — L988 Other specified disorders of the skin and subcutaneous tissue: Secondary | ICD-10-CM | POA: Diagnosis not present

## 2015-10-03 DIAGNOSIS — I10 Essential (primary) hypertension: Secondary | ICD-10-CM | POA: Diagnosis not present

## 2015-10-03 DIAGNOSIS — I890 Lymphedema, not elsewhere classified: Secondary | ICD-10-CM | POA: Diagnosis not present

## 2015-10-03 DIAGNOSIS — I517 Cardiomegaly: Secondary | ICD-10-CM | POA: Diagnosis not present

## 2015-10-03 DIAGNOSIS — L909 Atrophic disorder of skin, unspecified: Secondary | ICD-10-CM | POA: Diagnosis not present

## 2015-10-03 DIAGNOSIS — Z01810 Encounter for preprocedural cardiovascular examination: Secondary | ICD-10-CM | POA: Diagnosis not present

## 2015-10-03 DIAGNOSIS — I480 Paroxysmal atrial fibrillation: Secondary | ICD-10-CM | POA: Diagnosis not present

## 2015-10-03 DIAGNOSIS — L02211 Cutaneous abscess of abdominal wall: Secondary | ICD-10-CM | POA: Diagnosis not present

## 2015-10-03 DIAGNOSIS — M793 Panniculitis, unspecified: Secondary | ICD-10-CM | POA: Diagnosis not present

## 2015-10-03 DIAGNOSIS — Z6841 Body Mass Index (BMI) 40.0 and over, adult: Secondary | ICD-10-CM | POA: Diagnosis not present

## 2015-10-16 DIAGNOSIS — K654 Sclerosing mesenteritis: Secondary | ICD-10-CM | POA: Diagnosis not present

## 2015-10-16 DIAGNOSIS — I890 Lymphedema, not elsewhere classified: Secondary | ICD-10-CM | POA: Diagnosis not present

## 2015-10-16 DIAGNOSIS — L906 Striae atrophicae: Secondary | ICD-10-CM | POA: Diagnosis not present

## 2015-10-16 DIAGNOSIS — L02211 Cutaneous abscess of abdominal wall: Secondary | ICD-10-CM | POA: Diagnosis not present

## 2015-10-16 DIAGNOSIS — M793 Panniculitis, unspecified: Secondary | ICD-10-CM | POA: Diagnosis not present

## 2015-10-16 DIAGNOSIS — I10 Essential (primary) hypertension: Secondary | ICD-10-CM | POA: Diagnosis not present

## 2015-10-16 DIAGNOSIS — D696 Thrombocytopenia, unspecified: Secondary | ICD-10-CM | POA: Diagnosis not present

## 2015-10-16 DIAGNOSIS — Z6841 Body Mass Index (BMI) 40.0 and over, adult: Secondary | ICD-10-CM | POA: Diagnosis not present

## 2015-10-16 DIAGNOSIS — I7025 Atherosclerosis of native arteries of other extremities with ulceration: Secondary | ICD-10-CM | POA: Diagnosis not present

## 2015-10-16 DIAGNOSIS — I480 Paroxysmal atrial fibrillation: Secondary | ICD-10-CM | POA: Diagnosis not present

## 2015-10-16 DIAGNOSIS — L909 Atrophic disorder of skin, unspecified: Secondary | ICD-10-CM | POA: Diagnosis not present

## 2015-10-27 MED FILL — oxyCODONE HCL 5 MG TABS: 5 | 7 days supply | Qty: 42 | Fill #0

## 2015-10-29 DIAGNOSIS — G4733 Obstructive sleep apnea (adult) (pediatric): Secondary | ICD-10-CM | POA: Diagnosis not present

## 2015-10-29 DIAGNOSIS — M329 Systemic lupus erythematosus, unspecified: Secondary | ICD-10-CM | POA: Diagnosis not present

## 2015-10-29 DIAGNOSIS — Z6841 Body Mass Index (BMI) 40.0 and over, adult: Secondary | ICD-10-CM | POA: Diagnosis not present

## 2015-10-29 DIAGNOSIS — Z48817 Encounter for surgical aftercare following surgery on the skin and subcutaneous tissue: Secondary | ICD-10-CM | POA: Diagnosis not present

## 2015-10-29 DIAGNOSIS — E119 Type 2 diabetes mellitus without complications: Secondary | ICD-10-CM | POA: Diagnosis not present

## 2015-10-29 DIAGNOSIS — I10 Essential (primary) hypertension: Secondary | ICD-10-CM | POA: Diagnosis not present

## 2015-10-29 DIAGNOSIS — I2510 Atherosclerotic heart disease of native coronary artery without angina pectoris: Secondary | ICD-10-CM | POA: Diagnosis not present

## 2015-10-29 DIAGNOSIS — E785 Hyperlipidemia, unspecified: Secondary | ICD-10-CM | POA: Diagnosis not present

## 2015-10-31 ENCOUNTER — Other Ambulatory Visit: Payer: Self-pay | Admitting: Internal Medicine

## 2015-10-31 DIAGNOSIS — G4733 Obstructive sleep apnea (adult) (pediatric): Secondary | ICD-10-CM | POA: Diagnosis not present

## 2015-10-31 DIAGNOSIS — Z6841 Body Mass Index (BMI) 40.0 and over, adult: Secondary | ICD-10-CM | POA: Diagnosis not present

## 2015-10-31 DIAGNOSIS — M329 Systemic lupus erythematosus, unspecified: Secondary | ICD-10-CM | POA: Diagnosis not present

## 2015-10-31 DIAGNOSIS — I10 Essential (primary) hypertension: Secondary | ICD-10-CM | POA: Diagnosis not present

## 2015-10-31 DIAGNOSIS — I2510 Atherosclerotic heart disease of native coronary artery without angina pectoris: Secondary | ICD-10-CM | POA: Diagnosis not present

## 2015-10-31 DIAGNOSIS — E119 Type 2 diabetes mellitus without complications: Secondary | ICD-10-CM | POA: Diagnosis not present

## 2015-10-31 DIAGNOSIS — E785 Hyperlipidemia, unspecified: Secondary | ICD-10-CM | POA: Diagnosis not present

## 2015-10-31 DIAGNOSIS — Z48817 Encounter for surgical aftercare following surgery on the skin and subcutaneous tissue: Secondary | ICD-10-CM | POA: Diagnosis not present

## 2015-11-01 MED FILL — METOPROLOL SUCC ER 100 MG T: 100 | 15 days supply | Qty: 15 | Fill #0

## 2015-11-02 ENCOUNTER — Encounter: Payer: Self-pay | Admitting: Cardiology

## 2015-11-02 DIAGNOSIS — E785 Hyperlipidemia, unspecified: Secondary | ICD-10-CM | POA: Diagnosis not present

## 2015-11-02 DIAGNOSIS — Z48817 Encounter for surgical aftercare following surgery on the skin and subcutaneous tissue: Secondary | ICD-10-CM | POA: Diagnosis not present

## 2015-11-02 DIAGNOSIS — M329 Systemic lupus erythematosus, unspecified: Secondary | ICD-10-CM | POA: Diagnosis not present

## 2015-11-02 DIAGNOSIS — G4733 Obstructive sleep apnea (adult) (pediatric): Secondary | ICD-10-CM | POA: Diagnosis not present

## 2015-11-02 DIAGNOSIS — Z6841 Body Mass Index (BMI) 40.0 and over, adult: Secondary | ICD-10-CM | POA: Diagnosis not present

## 2015-11-02 DIAGNOSIS — I10 Essential (primary) hypertension: Secondary | ICD-10-CM | POA: Diagnosis not present

## 2015-11-02 DIAGNOSIS — I2510 Atherosclerotic heart disease of native coronary artery without angina pectoris: Secondary | ICD-10-CM | POA: Diagnosis not present

## 2015-11-02 DIAGNOSIS — E119 Type 2 diabetes mellitus without complications: Secondary | ICD-10-CM | POA: Diagnosis not present

## 2015-11-04 DIAGNOSIS — I7025 Atherosclerosis of native arteries of other extremities with ulceration: Secondary | ICD-10-CM | POA: Diagnosis not present

## 2015-11-04 DIAGNOSIS — G4733 Obstructive sleep apnea (adult) (pediatric): Secondary | ICD-10-CM | POA: Diagnosis not present

## 2015-11-04 DIAGNOSIS — R0689 Other abnormalities of breathing: Secondary | ICD-10-CM | POA: Diagnosis not present

## 2015-11-04 DIAGNOSIS — Z6841 Body Mass Index (BMI) 40.0 and over, adult: Secondary | ICD-10-CM | POA: Diagnosis not present

## 2015-11-04 DIAGNOSIS — Z48817 Encounter for surgical aftercare following surgery on the skin and subcutaneous tissue: Secondary | ICD-10-CM | POA: Diagnosis not present

## 2015-11-04 DIAGNOSIS — E785 Hyperlipidemia, unspecified: Secondary | ICD-10-CM | POA: Diagnosis not present

## 2015-11-04 DIAGNOSIS — I2510 Atherosclerotic heart disease of native coronary artery without angina pectoris: Secondary | ICD-10-CM | POA: Diagnosis not present

## 2015-11-04 DIAGNOSIS — E119 Type 2 diabetes mellitus without complications: Secondary | ICD-10-CM | POA: Diagnosis not present

## 2015-11-04 DIAGNOSIS — I10 Essential (primary) hypertension: Secondary | ICD-10-CM | POA: Diagnosis not present

## 2015-11-04 DIAGNOSIS — M329 Systemic lupus erythematosus, unspecified: Secondary | ICD-10-CM | POA: Diagnosis not present

## 2015-11-07 DIAGNOSIS — G4733 Obstructive sleep apnea (adult) (pediatric): Secondary | ICD-10-CM | POA: Diagnosis not present

## 2015-11-07 DIAGNOSIS — Z48817 Encounter for surgical aftercare following surgery on the skin and subcutaneous tissue: Secondary | ICD-10-CM | POA: Diagnosis not present

## 2015-11-07 DIAGNOSIS — Z6841 Body Mass Index (BMI) 40.0 and over, adult: Secondary | ICD-10-CM | POA: Diagnosis not present

## 2015-11-07 DIAGNOSIS — I10 Essential (primary) hypertension: Secondary | ICD-10-CM | POA: Diagnosis not present

## 2015-11-07 DIAGNOSIS — E119 Type 2 diabetes mellitus without complications: Secondary | ICD-10-CM | POA: Diagnosis not present

## 2015-11-07 DIAGNOSIS — I2510 Atherosclerotic heart disease of native coronary artery without angina pectoris: Secondary | ICD-10-CM | POA: Diagnosis not present

## 2015-11-07 DIAGNOSIS — M329 Systemic lupus erythematosus, unspecified: Secondary | ICD-10-CM | POA: Diagnosis not present

## 2015-11-07 DIAGNOSIS — E785 Hyperlipidemia, unspecified: Secondary | ICD-10-CM | POA: Diagnosis not present

## 2015-11-09 DIAGNOSIS — Z48817 Encounter for surgical aftercare following surgery on the skin and subcutaneous tissue: Secondary | ICD-10-CM | POA: Diagnosis not present

## 2015-11-09 DIAGNOSIS — E119 Type 2 diabetes mellitus without complications: Secondary | ICD-10-CM | POA: Diagnosis not present

## 2015-11-09 DIAGNOSIS — I2510 Atherosclerotic heart disease of native coronary artery without angina pectoris: Secondary | ICD-10-CM | POA: Diagnosis not present

## 2015-11-09 DIAGNOSIS — I10 Essential (primary) hypertension: Secondary | ICD-10-CM | POA: Diagnosis not present

## 2015-11-09 DIAGNOSIS — M329 Systemic lupus erythematosus, unspecified: Secondary | ICD-10-CM | POA: Diagnosis not present

## 2015-11-09 DIAGNOSIS — Z6841 Body Mass Index (BMI) 40.0 and over, adult: Secondary | ICD-10-CM | POA: Diagnosis not present

## 2015-11-09 DIAGNOSIS — E785 Hyperlipidemia, unspecified: Secondary | ICD-10-CM | POA: Diagnosis not present

## 2015-11-09 DIAGNOSIS — G4733 Obstructive sleep apnea (adult) (pediatric): Secondary | ICD-10-CM | POA: Diagnosis not present

## 2015-11-10 ENCOUNTER — Encounter: Payer: Self-pay | Admitting: Cardiology

## 2015-11-10 ENCOUNTER — Ambulatory Visit (INDEPENDENT_AMBULATORY_CARE_PROVIDER_SITE_OTHER): Payer: 59 | Admitting: Cardiology

## 2015-11-10 VITALS — BP 130/80 | HR 92 | Ht 69.0 in | Wt 317.0 lb

## 2015-11-10 DIAGNOSIS — E785 Hyperlipidemia, unspecified: Secondary | ICD-10-CM

## 2015-11-10 DIAGNOSIS — I493 Ventricular premature depolarization: Secondary | ICD-10-CM

## 2015-11-10 DIAGNOSIS — I2510 Atherosclerotic heart disease of native coronary artery without angina pectoris: Secondary | ICD-10-CM

## 2015-11-10 DIAGNOSIS — I2583 Coronary atherosclerosis due to lipid rich plaque: Secondary | ICD-10-CM

## 2015-11-10 DIAGNOSIS — I10 Essential (primary) hypertension: Secondary | ICD-10-CM

## 2015-11-10 DIAGNOSIS — Z9989 Dependence on other enabling machines and devices: Secondary | ICD-10-CM

## 2015-11-10 DIAGNOSIS — G4733 Obstructive sleep apnea (adult) (pediatric): Secondary | ICD-10-CM

## 2015-11-10 DIAGNOSIS — I480 Paroxysmal atrial fibrillation: Secondary | ICD-10-CM

## 2015-11-10 NOTE — Patient Instructions (Addendum)
Medication Instructions:  Your physician recommends that you continue on your current medications as directed. Please refer to the Current Medication list given to you today.   Labwork: 1-2 WEEKS:  FASTING LIPID & CMET  Testing/Procedures: None ordered  Follow-Up: Your physician recommends that you schedule a follow-up appointment in: 3 MONTHS WITH DR. [name]  Any Other Special Instructions Will Be Listed Below (If Applicable).    If you need a refill on your cardiac medications before your next appointment, please call your pharmacy.

## 2015-11-10 NOTE — Progress Notes (Signed)
Cardiology Office Note   Date:  11/10/2015   ID:  BRENTON [name], DOB 02-09-1955, MRN [id number]  PCP:  Travis Heck, MD  Cardiologist: Dr. Radford [name]    Chief Complaint  Patient presents with  . Coronary Artery Disease    PAF post op.         History of Present Illness: Travis [name] is a 60 y.o. male who presents for CAD.    He has a history of ASCAD with MI in 1997 with PCI by Dr. Glade [name], HTN, dyslipidemia and PVC's and on metoprolol for PVCs.  Recent surgery in [facility] for tumor removal of groin that weighed 30 lbs with abscess and abd fat area removed.  Wt is down 60 lbs.  Post procedure he did develop atrial fib and was given IV metoprolol per pt. And converted back to SR was not put on anticoagulation and he is off ASA due to surgery. Continues with wound vac.  Seen by surgeon Dr. Winn [name] in [place] yesterday.  We have called for records.    He has occ skipped beat but no awareness of tachycardia since discharge.  Pt's last nuc was 05/2013 and was similar to previous.  Last Echo 2015 with EF 55-60% LA was mildly dilated.  No RWMA.   Pt has sleep apnea and has trouble using his CPAP recently more so he can hear the wound vac alarm.  Followed by Dr. Annamaria [name].  No chest pain or SOB today.    Past Medical History:  Diagnosis Date  . Appendicitis   . Blood in stool   . Coronary artery disease 1997   s/p PCI by Dr. Glade [name]  . Diverticulosis of colon with hemorrhage 2009  . Dyslipidemia 04/27/2014  . Dyslipidemia, goal LDL below 70   . Edema   . Encounter for long-term (current) use of other medications   . Essential hypertension, benign   . Family history of thyroid disease   . Frequent unifocal PVCs   . History of MI (myocardial infarction) 1997   Angioplasty  . Morbid obesity ([place])   . Myalgia and myositis, unspecified   . Prostate cancer ([place]) 2012  . Sleep apnea with use of continuous positive airway pressure (CPAP)   . Thrombocytopenia ([place])  05/25/2014  . Type II or unspecified type diabetes mellitus without mention of complication, not stated as uncontrolled     Past Surgical History:  Procedure Laterality Date  . ANGIOPLASTY  1997   Dr. Glade [name]  . APPENDECTOMY       Current Outpatient Prescriptions  Medication Sig Dispense Refill  . amLODipine (NORVASC) 10 MG tablet Take 10 mg by mouth daily.    [name] b complex vitamins tablet Take 1 tablet by mouth daily.    . fenofibrate micronized (LOFIBRA) 200 MG capsule Take 200 mg by mouth daily before breakfast.     . furosemide (LASIX) 20 MG tablet Take 20-40 mg by mouth daily as needed for edema (EDEMA).     [name] ibuprofen (ADVIL,MOTRIN) 200 MG tablet Take 200 mg by mouth as needed.    [name] lisinopril (PRINIVIL,ZESTRIL) 40 MG tablet Take 40 mg by mouth daily.    . Magnesium 250 MG TABS Take 1 tablet by mouth daily.    . metFORMIN (GLUCOPHAGE) 500 MG tablet Take 1,000 mg by mouth 2 (two) times daily with a meal.     . metoprolol succinate (TOPROL-XL) 100 MG 24 hr tablet TAKE 1 TABLET BY MOUTH ONCE DAILY WITH OR  IMMEDIATELY FOLLOWING A MEAL 15 tablet 0  . Omega-3 Fatty Acids (SALMON OIL-1000 PO) Take 2 capsules by mouth 2 (two) times daily.    . vitamin C (ASCORBIC ACID) 500 MG tablet Take 500 mg by mouth daily.    [name] zinc gluconate 50 MG tablet Take 50 mg by mouth daily.    . sertraline (ZOLOFT) 25 MG tablet Take 25 mg by mouth daily.     No current facility-administered medications for this visit.     Allergies:   Crestor [rosuvastatin]    Social History:  The patient  reports that he has never smoked. He has never used smokeless tobacco. He reports that he does not drink alcohol or use drugs.   Family History:  The patient's family history includes CVA in his father; Diabetes in his mother; Hypertension in his father and sister; Hypothyroidism in his sister.    ROS:  General:no colds or fevers, no weight changes Skin:no rashes or ulcers HEENT:no blurred vision, no  congestion CV:see HPI PUL:see HPI GI:no diarrhea constipation or melena, no indigestion GU:no hematuria, no dysuria MS:no joint pain, no claudication Neuro:no syncope, no lightheadedness Endo:no diabetes, no thyroid disease  Wt Readings from Last 3 Encounters:  11/10/15 (!) 317 lb (143.8 kg)  08/29/15 (!) 376 lb (170.6 kg)  05/23/15 (!) 383 lb (173.7 kg)     PHYSICAL EXAM: VS:  BP 130/80   Pulse 92   Ht 5\' 9"  (1.753 m)   Wt (!) 317 lb (143.8 kg)   SpO2 97%   BMI 46.81 kg/m  , BMI Body mass index is 46.81 kg/m. General:Pleasant affect, NAD Skin:Warm and dry, brisk capillary refill HEENT:normocephalic, sclera clear, mucus membranes moist Neck:supple, no JVD, no bruits  Heart:S1S2 RRR without murmur, gallup, rub or click Lungs:clear without rales, rhonchi, or wheezes [id number], non tender, + BS, do not palpate liver spleen or masses Ext:no lower ext edema, 2+ pedal pulses, 2+ radial pulses Neuro:alert and oriented, MAE, follows commands, + facial symmetry    EKG:  EKG is ordered today. The ekg ordered today demonstrates SR no acute changes rare PVC.    Recent Labs: 09/17/2015: BUN 17; Creatinine, Ser 0.80; Hemoglobin 13.3; Potassium 4.2; Sodium 135    Lipid Panel    Component Value Date/Time   CHOL 133 09/17/2014 0728   TRIG 195.0 (H) 09/17/2014 0728   HDL 26.70 (L) 09/17/2014 0728   CHOLHDL 5 09/17/2014 0728   VLDL 39.0 09/17/2014 0728   LDLCALC 68 09/17/2014 0728       Other studies Reviewed: Additional studies/ records that were reviewed today include:previous tests, awaiting records from [place]..   ASSESSMENT AND PLAN:  1.  ASCAD with remote MI in 1997 with PCI - last nuclear stress test in 2015 showed mild inferolateral and apical primarily fixed defect with very small amount of peri infarct ischemia. Scan similar to prior scansHe has had this mild.  He has had atypical chest pain since his PCI years ago and has not changed. Continue medical  management- no exertional angina.  He walks for exercise- resume ASA as soon as surgery agrees  2. PAF during hospitalization, none since.  Will obtain records from [place].  May need monitor to eval for further a fib.   3.  HTN - controlled on amlodipine/ACE I/BB 4.  Hyperlipidemia - continue fenofibrate will check hepatic and lipids in 1-2 weeks 5.  PVC's - asymptomatic- continue metoprolol 6.  OSA followed by pulmonary  Follow up in 3 months,  but will need to review notes from [place].  Current medicines are reviewed with the patient today.  The patient Has no concerns regarding medicines.  The following changes have been made:  See above Labs/ tests ordered today include:see above  Disposition:   FU:  see above  Signed, Cecilie Kicks, NP  11/10/2015 3:05 PM    [facility] [place], [place], [place], [place] Phone: [phone]; Fax: [phone]

## 2015-11-11 DIAGNOSIS — Z6841 Body Mass Index (BMI) 40.0 and over, adult: Secondary | ICD-10-CM | POA: Diagnosis not present

## 2015-11-11 DIAGNOSIS — I2510 Atherosclerotic heart disease of native coronary artery without angina pectoris: Secondary | ICD-10-CM | POA: Diagnosis not present

## 2015-11-11 DIAGNOSIS — E119 Type 2 diabetes mellitus without complications: Secondary | ICD-10-CM | POA: Diagnosis not present

## 2015-11-11 DIAGNOSIS — G4733 Obstructive sleep apnea (adult) (pediatric): Secondary | ICD-10-CM | POA: Diagnosis not present

## 2015-11-11 DIAGNOSIS — I10 Essential (primary) hypertension: Secondary | ICD-10-CM | POA: Diagnosis not present

## 2015-11-11 DIAGNOSIS — E785 Hyperlipidemia, unspecified: Secondary | ICD-10-CM | POA: Diagnosis not present

## 2015-11-11 DIAGNOSIS — M329 Systemic lupus erythematosus, unspecified: Secondary | ICD-10-CM | POA: Diagnosis not present

## 2015-11-11 DIAGNOSIS — Z48817 Encounter for surgical aftercare following surgery on the skin and subcutaneous tissue: Secondary | ICD-10-CM | POA: Diagnosis not present

## 2015-11-14 DIAGNOSIS — Z6841 Body Mass Index (BMI) 40.0 and over, adult: Secondary | ICD-10-CM | POA: Diagnosis not present

## 2015-11-14 DIAGNOSIS — E785 Hyperlipidemia, unspecified: Secondary | ICD-10-CM | POA: Diagnosis not present

## 2015-11-14 DIAGNOSIS — E119 Type 2 diabetes mellitus without complications: Secondary | ICD-10-CM | POA: Diagnosis not present

## 2015-11-14 DIAGNOSIS — Z48817 Encounter for surgical aftercare following surgery on the skin and subcutaneous tissue: Secondary | ICD-10-CM | POA: Diagnosis not present

## 2015-11-14 DIAGNOSIS — G4733 Obstructive sleep apnea (adult) (pediatric): Secondary | ICD-10-CM | POA: Diagnosis not present

## 2015-11-14 DIAGNOSIS — M329 Systemic lupus erythematosus, unspecified: Secondary | ICD-10-CM | POA: Diagnosis not present

## 2015-11-14 DIAGNOSIS — I2510 Atherosclerotic heart disease of native coronary artery without angina pectoris: Secondary | ICD-10-CM | POA: Diagnosis not present

## 2015-11-14 DIAGNOSIS — I10 Essential (primary) hypertension: Secondary | ICD-10-CM | POA: Diagnosis not present

## 2015-11-16 DIAGNOSIS — Z48817 Encounter for surgical aftercare following surgery on the skin and subcutaneous tissue: Secondary | ICD-10-CM | POA: Diagnosis not present

## 2015-11-16 DIAGNOSIS — G4733 Obstructive sleep apnea (adult) (pediatric): Secondary | ICD-10-CM | POA: Diagnosis not present

## 2015-11-16 DIAGNOSIS — Z6841 Body Mass Index (BMI) 40.0 and over, adult: Secondary | ICD-10-CM | POA: Diagnosis not present

## 2015-11-16 DIAGNOSIS — E785 Hyperlipidemia, unspecified: Secondary | ICD-10-CM | POA: Diagnosis not present

## 2015-11-16 DIAGNOSIS — E119 Type 2 diabetes mellitus without complications: Secondary | ICD-10-CM | POA: Diagnosis not present

## 2015-11-16 DIAGNOSIS — M329 Systemic lupus erythematosus, unspecified: Secondary | ICD-10-CM | POA: Diagnosis not present

## 2015-11-16 DIAGNOSIS — I10 Essential (primary) hypertension: Secondary | ICD-10-CM | POA: Diagnosis not present

## 2015-11-16 DIAGNOSIS — I2510 Atherosclerotic heart disease of native coronary artery without angina pectoris: Secondary | ICD-10-CM | POA: Diagnosis not present

## 2015-11-17 ENCOUNTER — Other Ambulatory Visit: Payer: 59 | Admitting: *Deleted

## 2015-11-17 DIAGNOSIS — I2510 Atherosclerotic heart disease of native coronary artery without angina pectoris: Secondary | ICD-10-CM

## 2015-11-17 DIAGNOSIS — I493 Ventricular premature depolarization: Secondary | ICD-10-CM

## 2015-11-17 DIAGNOSIS — I10 Essential (primary) hypertension: Secondary | ICD-10-CM

## 2015-11-17 DIAGNOSIS — I2583 Coronary atherosclerosis due to lipid rich plaque: Secondary | ICD-10-CM | POA: Diagnosis not present

## 2015-11-17 DIAGNOSIS — E785 Hyperlipidemia, unspecified: Secondary | ICD-10-CM

## 2015-11-17 LAB — LIPID PANEL
CHOL/HDL RATIO: 5.3 ratio — AB (ref <=5.0)
Cholesterol: 187 mg/dL (ref 125–200)
HDL: 35 mg/dL — ABNORMAL LOW (ref >=40)
LDL Cholesterol: 128 mg/dL (ref <130)
Triglycerides: 120 mg/dL (ref <150)
VLDL: 24 mg/dL (ref <30)

## 2015-11-17 LAB — COMPREHENSIVE METABOLIC PANEL
ALK PHOS: 52 U/L (ref 40–115)
ALT: 14 U/L (ref 9–46)
AST: 17 U/L (ref 10–35)
Albumin: 3.4 g/dL — ABNORMAL LOW (ref 3.6–5.1)
BILIRUBIN TOTAL: 0.4 mg/dL (ref 0.2–1.2)
BUN: 21 mg/dL (ref 7–25)
CO2: 22 mmol/L (ref 20–31)
CREATININE: 0.86 mg/dL (ref 0.70–1.33)
Calcium: 8.8 mg/dL (ref 8.6–10.3)
Chloride: 108 mmol/L (ref 98–110)
GLUCOSE: 138 mg/dL — AB (ref 65–99)
Potassium: 3.8 mmol/L (ref 3.5–5.3)
SODIUM: 142 mmol/L (ref 135–146)
Total Protein: 5.9 g/dL — ABNORMAL LOW (ref 6.1–8.1)

## 2015-11-18 ENCOUNTER — Telehealth: Payer: Self-pay | Admitting: Cardiology

## 2015-11-18 DIAGNOSIS — Z6841 Body Mass Index (BMI) 40.0 and over, adult: Secondary | ICD-10-CM | POA: Diagnosis not present

## 2015-11-18 DIAGNOSIS — I2510 Atherosclerotic heart disease of native coronary artery without angina pectoris: Secondary | ICD-10-CM | POA: Diagnosis not present

## 2015-11-18 DIAGNOSIS — M329 Systemic lupus erythematosus, unspecified: Secondary | ICD-10-CM | POA: Diagnosis not present

## 2015-11-18 DIAGNOSIS — Z48817 Encounter for surgical aftercare following surgery on the skin and subcutaneous tissue: Secondary | ICD-10-CM | POA: Diagnosis not present

## 2015-11-18 DIAGNOSIS — G4733 Obstructive sleep apnea (adult) (pediatric): Secondary | ICD-10-CM | POA: Diagnosis not present

## 2015-11-18 DIAGNOSIS — E785 Hyperlipidemia, unspecified: Secondary | ICD-10-CM | POA: Diagnosis not present

## 2015-11-18 DIAGNOSIS — I10 Essential (primary) hypertension: Secondary | ICD-10-CM | POA: Diagnosis not present

## 2015-11-18 DIAGNOSIS — E119 Type 2 diabetes mellitus without complications: Secondary | ICD-10-CM | POA: Diagnosis not present

## 2015-11-18 NOTE — Telephone Encounter (Signed)
New message ° ° ° ° ° ° ° ° °Pt returning call for test results  °

## 2015-11-19 DIAGNOSIS — I2510 Atherosclerotic heart disease of native coronary artery without angina pectoris: Secondary | ICD-10-CM | POA: Diagnosis not present

## 2015-11-19 DIAGNOSIS — Z48817 Encounter for surgical aftercare following surgery on the skin and subcutaneous tissue: Secondary | ICD-10-CM | POA: Diagnosis not present

## 2015-11-19 DIAGNOSIS — E119 Type 2 diabetes mellitus without complications: Secondary | ICD-10-CM | POA: Diagnosis not present

## 2015-11-19 DIAGNOSIS — E785 Hyperlipidemia, unspecified: Secondary | ICD-10-CM | POA: Diagnosis not present

## 2015-11-19 DIAGNOSIS — Z6841 Body Mass Index (BMI) 40.0 and over, adult: Secondary | ICD-10-CM | POA: Diagnosis not present

## 2015-11-19 DIAGNOSIS — G4733 Obstructive sleep apnea (adult) (pediatric): Secondary | ICD-10-CM | POA: Diagnosis not present

## 2015-11-19 DIAGNOSIS — I10 Essential (primary) hypertension: Secondary | ICD-10-CM | POA: Diagnosis not present

## 2015-11-19 DIAGNOSIS — M329 Systemic lupus erythematosus, unspecified: Secondary | ICD-10-CM | POA: Diagnosis not present

## 2015-11-21 DIAGNOSIS — E119 Type 2 diabetes mellitus without complications: Secondary | ICD-10-CM | POA: Diagnosis not present

## 2015-11-21 DIAGNOSIS — G4733 Obstructive sleep apnea (adult) (pediatric): Secondary | ICD-10-CM | POA: Diagnosis not present

## 2015-11-21 DIAGNOSIS — Z6841 Body Mass Index (BMI) 40.0 and over, adult: Secondary | ICD-10-CM | POA: Diagnosis not present

## 2015-11-21 DIAGNOSIS — I10 Essential (primary) hypertension: Secondary | ICD-10-CM | POA: Diagnosis not present

## 2015-11-21 DIAGNOSIS — I2510 Atherosclerotic heart disease of native coronary artery without angina pectoris: Secondary | ICD-10-CM | POA: Diagnosis not present

## 2015-11-21 DIAGNOSIS — E785 Hyperlipidemia, unspecified: Secondary | ICD-10-CM | POA: Diagnosis not present

## 2015-11-21 DIAGNOSIS — M329 Systemic lupus erythematosus, unspecified: Secondary | ICD-10-CM | POA: Diagnosis not present

## 2015-11-21 DIAGNOSIS — Z48817 Encounter for surgical aftercare following surgery on the skin and subcutaneous tissue: Secondary | ICD-10-CM | POA: Diagnosis not present

## 2015-11-22 ENCOUNTER — Ambulatory Visit (INDEPENDENT_AMBULATORY_CARE_PROVIDER_SITE_OTHER): Payer: 59 | Admitting: Pharmacist

## 2015-11-22 VITALS — BP 128/84 | HR 93 | Ht 69.0 in | Wt 331.0 lb

## 2015-11-22 DIAGNOSIS — E785 Hyperlipidemia, unspecified: Secondary | ICD-10-CM

## 2015-11-22 DIAGNOSIS — I2510 Atherosclerotic heart disease of native coronary artery without angina pectoris: Secondary | ICD-10-CM | POA: Diagnosis not present

## 2015-11-22 MED ORDER — ROSUVASTATIN CALCIUM 10 MG PO TABS: 10.0000 mg | ORAL_TABLET | Freq: Every day | ORAL | 11 refills | Status: DC

## 2015-11-22 MED ORDER — METOPROLOL SUCCINATE ER 100 MG PO TB24: 100.0000 mg | ORAL_TABLET | Freq: Every day | ORAL | 0 refills | Status: DC

## 2015-11-22 MED FILL — METOPROLOL SUCC ER 100 MG T: 100 | 90 days supply | Qty: 90 | Fill #0

## 2015-11-22 MED FILL — ROSUVASTATIN CALCIUM 10 MG: 10 | 30 days supply | Qty: 30 | Fill #0

## 2015-11-22 NOTE — Progress Notes (Signed)
Patient ID: Travis [name]                 DOB: 1955-10-22                    MRN: [id number]     HPI: Travis [name] is a 60 y.o. male patient of Dr [name] referred to lipid clinic by Cecilie Kicks, NP. PMH is significant for MI in 1997, HTN, HLD, DM, obesity, and PVCs. He recently underwent 30 lb tumor removal from the groin.  Patient has tried taking Lipitor, Crestor and Zetia in the past and was intolerant to each. He experienced myalgias with Lipitor 80mg  and Crestor 40mg  daily. He previously took Zetia but developed thrombocytopenia with platelet count down to 82. Platelet count improved to 153 upon Zetia discontinuation.   Current Medications: fenofibrate 200 mg daily, fish oil 2000mg  BID Intolerances: Lipitor 80 mg, Crestor 40 mg (muscle pain, weakness), Zetia 10 mg (caused low platelet count) Risk Factors: MI 1997 with PCI, HTN, DM, morbidly obese LDL goal: 70mg /dL  Diet: Has protein shakes in the morning, eats variety of meats (chicken and fish) and vegetables. He tries to bake his food, but fries foods occasionally in butter. He drinks sodas throughout the day - both diet and regular, Gatorade and water.   Exercise: Hasn't been able to exercise since having surgery (mass removal). Usually stays active at his job.   Family History: HTN (father, sister), CVA (father), DM (mother), hypothyroidism (sister)  Social History: Never smoker. Denies alcohol and illicit drug use  Labs:  11/17/2015: TC 187, TG 120, HDL 35, LDL 128 (fenofibrate 200mg  daily and fish oil 2g BID) 09/17/2014: TC 133, TG 195, HDL 26.7, LDL 68 (Lipitor 80mg )   Past Medical History:  Diagnosis Date  . Appendicitis   . Blood in stool   . Coronary artery disease 1997   s/p PCI by Dr. Glade Lloyd  . Diverticulosis of colon with hemorrhage 2009  . Dyslipidemia 04/27/2014  . Dyslipidemia, goal LDL below 70   . Edema   . Encounter for long-term (current) use of other medications   . Essential hypertension, benign   .  Family history of thyroid disease   . Frequent unifocal PVCs   . History of MI (myocardial infarction) 1997   Angioplasty  . Morbid obesity ([place])   . Myalgia and myositis, unspecified   . Prostate cancer ([place]) 2012  . Sleep apnea with use of continuous positive airway pressure (CPAP)   . Thrombocytopenia ([place]) 05/25/2014  . Type II or unspecified type diabetes mellitus without mention of complication, not stated as uncontrolled     Current Outpatient Prescriptions on File Prior to Visit  Medication Sig Dispense Refill  . amLODipine (NORVASC) 10 MG tablet Take 10 mg by mouth daily.    [name] b complex vitamins tablet Take 1 tablet by mouth daily.    . fenofibrate micronized (LOFIBRA) 200 MG capsule Take 200 mg by mouth daily before breakfast.     . furosemide (LASIX) 20 MG tablet Take 20-40 mg by mouth daily as needed for edema (EDEMA).     [name] ibuprofen (ADVIL,MOTRIN) 200 MG tablet Take 200 mg by mouth as needed.    [name] lisinopril (PRINIVIL,ZESTRIL) 40 MG tablet Take 40 mg by mouth daily.    . Magnesium 250 MG TABS Take 1 tablet by mouth daily.    . metFORMIN (GLUCOPHAGE) 500 MG tablet Take 1,000 mg by mouth 2 (two) times daily with a  meal.     . metoprolol succinate (TOPROL-XL) 100 MG 24 hr tablet TAKE 1 TABLET BY MOUTH ONCE DAILY WITH OR IMMEDIATELY FOLLOWING A MEAL 15 tablet 0  . Omega-3 Fatty Acids (SALMON OIL-1000 PO) Take 2 capsules by mouth 2 (two) times daily.    . sertraline (ZOLOFT) 25 MG tablet Take 25 mg by mouth daily.    . vitamin C (ASCORBIC ACID) 500 MG tablet Take 500 mg by mouth daily.    [name] zinc gluconate 50 MG tablet Take 50 mg by mouth daily.     No current facility-administered medications on file prior to visit.     Allergies  Allergen Reactions  . Crestor [Rosuvastatin]     Muscle Pain     Assessment/Plan:  1. Hyperlipidemia - Patient's LDL is 128 which is above the goal of <70 given history of ASCVD. Patient is intolerant to max dose Lipitor and Crestor. He is  willing to rechallenge with low dose Crestor 10 mg daily. Patient can try taking Coenzyme Q-10 for myalgias. Advised if patient has myalgias, he can try taking Crestor every other day. Continue taking fenofibrate and fish oil. If patient is still intolerant, will start the PA process for PCSK9I. Lipid panel scheduled in3 months prior to OV with Dr [name].  Patient signed informed consent for [facility] lipid registry.   Delorus Langwell E. Supple, PharmD, [place] [id number] [address], [place], [place] [address] Phone: [phone]; Fax: [phone] 11/22/2015 10:11 AM

## 2015-11-22 NOTE — Patient Instructions (Signed)
Start taking Crestor (rosuvastatin) 10mg  once a day.  If you start to notice muscle aches, you can try taking coenzyme Q 10 200mg  daily. If this does not help, try taking your Crestor every other day.  Continue taking fenofibrate and fish oil.  Recheck lipid panel on Friday, January 26th. Come in any time after 7:30am for fasting blood work.  Call Megan in lipid clinic with any questions [phone].

## 2015-11-24 DIAGNOSIS — I2510 Atherosclerotic heart disease of native coronary artery without angina pectoris: Secondary | ICD-10-CM | POA: Diagnosis not present

## 2015-11-24 DIAGNOSIS — Z6841 Body Mass Index (BMI) 40.0 and over, adult: Secondary | ICD-10-CM | POA: Diagnosis not present

## 2015-11-24 DIAGNOSIS — E119 Type 2 diabetes mellitus without complications: Secondary | ICD-10-CM | POA: Diagnosis not present

## 2015-11-24 DIAGNOSIS — E785 Hyperlipidemia, unspecified: Secondary | ICD-10-CM | POA: Diagnosis not present

## 2015-11-24 DIAGNOSIS — I10 Essential (primary) hypertension: Secondary | ICD-10-CM | POA: Diagnosis not present

## 2015-11-24 DIAGNOSIS — G4733 Obstructive sleep apnea (adult) (pediatric): Secondary | ICD-10-CM | POA: Diagnosis not present

## 2015-11-24 DIAGNOSIS — M329 Systemic lupus erythematosus, unspecified: Secondary | ICD-10-CM | POA: Diagnosis not present

## 2015-11-24 DIAGNOSIS — Z48817 Encounter for surgical aftercare following surgery on the skin and subcutaneous tissue: Secondary | ICD-10-CM | POA: Diagnosis not present

## 2015-11-25 DIAGNOSIS — I7025 Atherosclerosis of native arteries of other extremities with ulceration: Secondary | ICD-10-CM | POA: Diagnosis not present

## 2015-11-26 DIAGNOSIS — I2510 Atherosclerotic heart disease of native coronary artery without angina pectoris: Secondary | ICD-10-CM | POA: Diagnosis not present

## 2015-11-26 DIAGNOSIS — Z48817 Encounter for surgical aftercare following surgery on the skin and subcutaneous tissue: Secondary | ICD-10-CM | POA: Diagnosis not present

## 2015-11-26 DIAGNOSIS — I10 Essential (primary) hypertension: Secondary | ICD-10-CM | POA: Diagnosis not present

## 2015-11-26 DIAGNOSIS — Z6841 Body Mass Index (BMI) 40.0 and over, adult: Secondary | ICD-10-CM | POA: Diagnosis not present

## 2015-11-26 DIAGNOSIS — G4733 Obstructive sleep apnea (adult) (pediatric): Secondary | ICD-10-CM | POA: Diagnosis not present

## 2015-11-26 DIAGNOSIS — E785 Hyperlipidemia, unspecified: Secondary | ICD-10-CM | POA: Diagnosis not present

## 2015-11-26 DIAGNOSIS — E119 Type 2 diabetes mellitus without complications: Secondary | ICD-10-CM | POA: Diagnosis not present

## 2015-11-26 DIAGNOSIS — M329 Systemic lupus erythematosus, unspecified: Secondary | ICD-10-CM | POA: Diagnosis not present

## 2015-11-28 ENCOUNTER — Encounter: Payer: Self-pay | Admitting: *Deleted

## 2015-11-28 DIAGNOSIS — I2510 Atherosclerotic heart disease of native coronary artery without angina pectoris: Secondary | ICD-10-CM | POA: Diagnosis not present

## 2015-11-28 DIAGNOSIS — E785 Hyperlipidemia, unspecified: Secondary | ICD-10-CM | POA: Diagnosis not present

## 2015-11-28 DIAGNOSIS — Z006 Encounter for examination for normal comparison and control in clinical research program: Secondary | ICD-10-CM

## 2015-11-28 DIAGNOSIS — G4733 Obstructive sleep apnea (adult) (pediatric): Secondary | ICD-10-CM | POA: Diagnosis not present

## 2015-11-28 DIAGNOSIS — E119 Type 2 diabetes mellitus without complications: Secondary | ICD-10-CM | POA: Diagnosis not present

## 2015-11-28 DIAGNOSIS — M329 Systemic lupus erythematosus, unspecified: Secondary | ICD-10-CM | POA: Diagnosis not present

## 2015-11-28 DIAGNOSIS — Z48817 Encounter for surgical aftercare following surgery on the skin and subcutaneous tissue: Secondary | ICD-10-CM | POA: Diagnosis not present

## 2015-11-28 DIAGNOSIS — I10 Essential (primary) hypertension: Secondary | ICD-10-CM | POA: Diagnosis not present

## 2015-11-28 DIAGNOSIS — R0689 Other abnormalities of breathing: Secondary | ICD-10-CM | POA: Diagnosis not present

## 2015-11-28 DIAGNOSIS — Z6841 Body Mass Index (BMI) 40.0 and over, adult: Secondary | ICD-10-CM | POA: Diagnosis not present

## 2015-11-28 NOTE — Progress Notes (Signed)
Late entry:  Subject met inclusion and exclusion criteria. The informed consent form, study requirements and expectations were reviewed with the subject and questions and concerns were addressed prior to the signing of the consent form. The subject verbalized understanding of the trail requirements. The subject agreed to participate in the [place] Registryand signed the informed consent. The informed consent was obtained prior to performance of any protocol-specific procedures for the subject. A copy of the signed informed consent was given to the subject and a copy was placed in the subject's medical record.  Jake Bathe, RN 11/22/2015

## 2015-11-30 DIAGNOSIS — E785 Hyperlipidemia, unspecified: Secondary | ICD-10-CM | POA: Diagnosis not present

## 2015-11-30 DIAGNOSIS — M329 Systemic lupus erythematosus, unspecified: Secondary | ICD-10-CM | POA: Diagnosis not present

## 2015-11-30 DIAGNOSIS — I2510 Atherosclerotic heart disease of native coronary artery without angina pectoris: Secondary | ICD-10-CM | POA: Diagnosis not present

## 2015-11-30 DIAGNOSIS — E119 Type 2 diabetes mellitus without complications: Secondary | ICD-10-CM | POA: Diagnosis not present

## 2015-11-30 DIAGNOSIS — Z48817 Encounter for surgical aftercare following surgery on the skin and subcutaneous tissue: Secondary | ICD-10-CM | POA: Diagnosis not present

## 2015-11-30 DIAGNOSIS — I10 Essential (primary) hypertension: Secondary | ICD-10-CM | POA: Diagnosis not present

## 2015-11-30 DIAGNOSIS — Z6841 Body Mass Index (BMI) 40.0 and over, adult: Secondary | ICD-10-CM | POA: Diagnosis not present

## 2015-11-30 DIAGNOSIS — G4733 Obstructive sleep apnea (adult) (pediatric): Secondary | ICD-10-CM | POA: Diagnosis not present

## 2015-11-30 DIAGNOSIS — I7025 Atherosclerosis of native arteries of other extremities with ulceration: Secondary | ICD-10-CM | POA: Diagnosis not present

## 2015-12-02 DIAGNOSIS — E119 Type 2 diabetes mellitus without complications: Secondary | ICD-10-CM | POA: Diagnosis not present

## 2015-12-02 DIAGNOSIS — Z6841 Body Mass Index (BMI) 40.0 and over, adult: Secondary | ICD-10-CM | POA: Diagnosis not present

## 2015-12-02 DIAGNOSIS — E785 Hyperlipidemia, unspecified: Secondary | ICD-10-CM | POA: Diagnosis not present

## 2015-12-02 DIAGNOSIS — G4733 Obstructive sleep apnea (adult) (pediatric): Secondary | ICD-10-CM | POA: Diagnosis not present

## 2015-12-02 DIAGNOSIS — M329 Systemic lupus erythematosus, unspecified: Secondary | ICD-10-CM | POA: Diagnosis not present

## 2015-12-02 DIAGNOSIS — I10 Essential (primary) hypertension: Secondary | ICD-10-CM | POA: Diagnosis not present

## 2015-12-02 DIAGNOSIS — I2510 Atherosclerotic heart disease of native coronary artery without angina pectoris: Secondary | ICD-10-CM | POA: Diagnosis not present

## 2015-12-02 DIAGNOSIS — Z48817 Encounter for surgical aftercare following surgery on the skin and subcutaneous tissue: Secondary | ICD-10-CM | POA: Diagnosis not present

## 2015-12-05 DIAGNOSIS — I2510 Atherosclerotic heart disease of native coronary artery without angina pectoris: Secondary | ICD-10-CM | POA: Diagnosis not present

## 2015-12-05 DIAGNOSIS — G4733 Obstructive sleep apnea (adult) (pediatric): Secondary | ICD-10-CM | POA: Diagnosis not present

## 2015-12-05 DIAGNOSIS — H40011 Open angle with borderline findings, low risk, right eye: Secondary | ICD-10-CM | POA: Diagnosis not present

## 2015-12-05 DIAGNOSIS — Z48817 Encounter for surgical aftercare following surgery on the skin and subcutaneous tissue: Secondary | ICD-10-CM | POA: Diagnosis not present

## 2015-12-05 DIAGNOSIS — M329 Systemic lupus erythematosus, unspecified: Secondary | ICD-10-CM | POA: Diagnosis not present

## 2015-12-05 DIAGNOSIS — E785 Hyperlipidemia, unspecified: Secondary | ICD-10-CM | POA: Diagnosis not present

## 2015-12-05 DIAGNOSIS — E119 Type 2 diabetes mellitus without complications: Secondary | ICD-10-CM | POA: Diagnosis not present

## 2015-12-05 DIAGNOSIS — I10 Essential (primary) hypertension: Secondary | ICD-10-CM | POA: Diagnosis not present

## 2015-12-05 DIAGNOSIS — H40012 Open angle with borderline findings, low risk, left eye: Secondary | ICD-10-CM | POA: Diagnosis not present

## 2015-12-05 DIAGNOSIS — Z6841 Body Mass Index (BMI) 40.0 and over, adult: Secondary | ICD-10-CM | POA: Diagnosis not present

## 2015-12-05 LAB — HM DIABETES EYE EXAM

## 2015-12-09 DIAGNOSIS — G4733 Obstructive sleep apnea (adult) (pediatric): Secondary | ICD-10-CM | POA: Diagnosis not present

## 2015-12-09 DIAGNOSIS — Z48817 Encounter for surgical aftercare following surgery on the skin and subcutaneous tissue: Secondary | ICD-10-CM | POA: Diagnosis not present

## 2015-12-09 DIAGNOSIS — I2510 Atherosclerotic heart disease of native coronary artery without angina pectoris: Secondary | ICD-10-CM | POA: Diagnosis not present

## 2015-12-09 DIAGNOSIS — E119 Type 2 diabetes mellitus without complications: Secondary | ICD-10-CM | POA: Diagnosis not present

## 2015-12-09 DIAGNOSIS — I10 Essential (primary) hypertension: Secondary | ICD-10-CM | POA: Diagnosis not present

## 2015-12-09 DIAGNOSIS — Z6841 Body Mass Index (BMI) 40.0 and over, adult: Secondary | ICD-10-CM | POA: Diagnosis not present

## 2015-12-09 DIAGNOSIS — M329 Systemic lupus erythematosus, unspecified: Secondary | ICD-10-CM | POA: Diagnosis not present

## 2015-12-09 DIAGNOSIS — E785 Hyperlipidemia, unspecified: Secondary | ICD-10-CM | POA: Diagnosis not present

## 2015-12-12 ENCOUNTER — Other Ambulatory Visit: Payer: Self-pay | Admitting: Internal Medicine

## 2015-12-12 DIAGNOSIS — E785 Hyperlipidemia, unspecified: Secondary | ICD-10-CM | POA: Diagnosis not present

## 2015-12-12 DIAGNOSIS — M329 Systemic lupus erythematosus, unspecified: Secondary | ICD-10-CM | POA: Diagnosis not present

## 2015-12-12 DIAGNOSIS — Z6841 Body Mass Index (BMI) 40.0 and over, adult: Secondary | ICD-10-CM | POA: Diagnosis not present

## 2015-12-12 DIAGNOSIS — Z48817 Encounter for surgical aftercare following surgery on the skin and subcutaneous tissue: Secondary | ICD-10-CM | POA: Diagnosis not present

## 2015-12-12 DIAGNOSIS — E119 Type 2 diabetes mellitus without complications: Secondary | ICD-10-CM | POA: Diagnosis not present

## 2015-12-12 DIAGNOSIS — I10 Essential (primary) hypertension: Secondary | ICD-10-CM | POA: Diagnosis not present

## 2015-12-12 DIAGNOSIS — G4733 Obstructive sleep apnea (adult) (pediatric): Secondary | ICD-10-CM | POA: Diagnosis not present

## 2015-12-12 DIAGNOSIS — I2510 Atherosclerotic heart disease of native coronary artery without angina pectoris: Secondary | ICD-10-CM | POA: Diagnosis not present

## 2015-12-12 MED FILL — TRUEplus LANCETS 30G MISC: 90 days supply | Qty: 400 | Fill #1

## 2015-12-12 MED FILL — FUROSEMIDE 20 MG TABLET: 20 | 45 days supply | Qty: 90 | Fill #0

## 2015-12-12 MED FILL — AMLODIPINE BESYLATE 10 MG T: 10 | 90 days supply | Qty: 90 | Fill #0

## 2015-12-12 MED FILL — FENOFIBRATE 200 MG CAPSULE: 200 | 90 days supply | Qty: 90 | Fill #0

## 2015-12-12 MED FILL — TRUE METRIX GLUCOSE TEST ST: 90 days supply | Qty: 400 | Fill #1

## 2015-12-12 MED FILL — SM ALCOHOL 70% PREP PADS: 70 | 90 days supply | Qty: 400 | Fill #1

## 2015-12-12 MED FILL — metFORMIN HCL 500 MG TABS: 500 | 90 days supply | Qty: 360 | Fill #0

## 2015-12-12 MED FILL — LISINOPRIL 40 MG TABLET: 40 | 90 days supply | Qty: 90 | Fill #0

## 2015-12-14 DIAGNOSIS — I2510 Atherosclerotic heart disease of native coronary artery without angina pectoris: Secondary | ICD-10-CM | POA: Diagnosis not present

## 2015-12-14 DIAGNOSIS — M329 Systemic lupus erythematosus, unspecified: Secondary | ICD-10-CM | POA: Diagnosis not present

## 2015-12-14 DIAGNOSIS — E119 Type 2 diabetes mellitus without complications: Secondary | ICD-10-CM | POA: Diagnosis not present

## 2015-12-14 DIAGNOSIS — E785 Hyperlipidemia, unspecified: Secondary | ICD-10-CM | POA: Diagnosis not present

## 2015-12-14 DIAGNOSIS — Z48817 Encounter for surgical aftercare following surgery on the skin and subcutaneous tissue: Secondary | ICD-10-CM | POA: Diagnosis not present

## 2015-12-14 DIAGNOSIS — G4733 Obstructive sleep apnea (adult) (pediatric): Secondary | ICD-10-CM | POA: Diagnosis not present

## 2015-12-14 DIAGNOSIS — Z6841 Body Mass Index (BMI) 40.0 and over, adult: Secondary | ICD-10-CM | POA: Diagnosis not present

## 2015-12-14 DIAGNOSIS — I10 Essential (primary) hypertension: Secondary | ICD-10-CM | POA: Diagnosis not present

## 2015-12-15 DIAGNOSIS — M329 Systemic lupus erythematosus, unspecified: Secondary | ICD-10-CM | POA: Diagnosis not present

## 2015-12-15 DIAGNOSIS — E785 Hyperlipidemia, unspecified: Secondary | ICD-10-CM | POA: Diagnosis not present

## 2015-12-15 DIAGNOSIS — G4733 Obstructive sleep apnea (adult) (pediatric): Secondary | ICD-10-CM | POA: Diagnosis not present

## 2015-12-15 DIAGNOSIS — Z6841 Body Mass Index (BMI) 40.0 and over, adult: Secondary | ICD-10-CM | POA: Diagnosis not present

## 2015-12-15 DIAGNOSIS — E119 Type 2 diabetes mellitus without complications: Secondary | ICD-10-CM | POA: Diagnosis not present

## 2015-12-15 DIAGNOSIS — Z48817 Encounter for surgical aftercare following surgery on the skin and subcutaneous tissue: Secondary | ICD-10-CM | POA: Diagnosis not present

## 2015-12-15 DIAGNOSIS — I2510 Atherosclerotic heart disease of native coronary artery without angina pectoris: Secondary | ICD-10-CM | POA: Diagnosis not present

## 2015-12-15 DIAGNOSIS — I10 Essential (primary) hypertension: Secondary | ICD-10-CM | POA: Diagnosis not present

## 2015-12-27 MED FILL — ROSUVASTATIN CALCIUM 10 MG: 10 | 90 days supply | Qty: 90 | Fill #1

## 2016-01-19 MED FILL — GABAPENTIN 100 MG CAPSULE: 100 | 23 days supply | Qty: 90 | Fill #1

## 2016-01-20 ENCOUNTER — Ambulatory Visit: Payer: Self-pay | Admitting: Pharmacist

## 2016-01-24 ENCOUNTER — Encounter: Payer: Self-pay | Admitting: General Practice

## 2016-01-26 ENCOUNTER — Encounter: Payer: Self-pay | Admitting: Family Medicine

## 2016-01-26 ENCOUNTER — Ambulatory Visit (INDEPENDENT_AMBULATORY_CARE_PROVIDER_SITE_OTHER): Payer: 59 | Admitting: Family Medicine

## 2016-01-26 VITALS — BP 124/86 | HR 80 | Temp 98.3°F | Resp 17 | Ht 69.0 in | Wt 338.1 lb

## 2016-01-26 DIAGNOSIS — E785 Hyperlipidemia, unspecified: Secondary | ICD-10-CM

## 2016-01-26 DIAGNOSIS — I10 Essential (primary) hypertension: Secondary | ICD-10-CM | POA: Diagnosis not present

## 2016-01-26 DIAGNOSIS — E119 Type 2 diabetes mellitus without complications: Secondary | ICD-10-CM

## 2016-01-26 DIAGNOSIS — H9190 Unspecified hearing loss, unspecified ear: Secondary | ICD-10-CM

## 2016-01-26 LAB — BASIC METABOLIC PANEL
BUN: 21 mg/dL (ref 7–25)
CHLORIDE: 103 mmol/L (ref 98–110)
CO2: 25 mmol/L (ref 20–31)
Calcium: 8.9 mg/dL (ref 8.6–10.3)
Creat: 0.95 mg/dL (ref 0.70–1.33)
Glucose, Bld: 202 mg/dL — ABNORMAL HIGH (ref 65–99)
POTASSIUM: 3.8 mmol/L (ref 3.5–5.3)
SODIUM: 137 mmol/L (ref 135–146)

## 2016-01-26 LAB — LIPID PANEL
CHOL/HDL RATIO: 4.4 ratio (ref <5.0)
Cholesterol: 109 mg/dL (ref <200)
HDL: 25 mg/dL — ABNORMAL LOW (ref >40)
LDL Cholesterol: 51 mg/dL (ref <100)
Triglycerides: 167 mg/dL — ABNORMAL HIGH (ref <150)
VLDL: 33 mg/dL — AB (ref <30)

## 2016-01-26 LAB — HEPATIC FUNCTION PANEL
ALK PHOS: 55 U/L (ref 40–115)
ALT: 13 U/L (ref 9–46)
AST: 17 U/L (ref 10–35)
Albumin: 3.7 g/dL (ref 3.6–5.1)
BILIRUBIN INDIRECT: 0.3 mg/dL (ref 0.2–1.2)
BILIRUBIN TOTAL: 0.4 mg/dL (ref 0.2–1.2)
Bilirubin, Direct: 0.1 mg/dL (ref <=0.2)
Total Protein: 6.6 g/dL (ref 6.1–8.1)

## 2016-01-26 LAB — TSH: TSH: 1.48 m[IU]/L (ref 0.40–4.50)

## 2016-01-26 NOTE — Assessment & Plan Note (Signed)
Pt's BMI is ~50.  Check labs to risk stratify but discussed the need for weight loss.  Will follow.

## 2016-01-26 NOTE — Progress Notes (Signed)
Pre visit review using our clinic review tool, if applicable. No additional management support is needed unless otherwise documented below in the visit note. 

## 2016-01-26 NOTE — Assessment & Plan Note (Signed)
New to provider, ongoing for pt.  Adequate control today.  Asymptomatic.  Check labs.  No anticipated med changes.  Will follow. 

## 2016-01-26 NOTE — Patient Instructions (Signed)
Schedule your complete physical in 3-4 months (we'll recheck the diabetes at this time) We'll notify you of your lab results and make any changes if needed Continue to work on healthy diet and regular exercise- you can do it!!! Call with any questions or concerns Welcome!  We're glad to have you!!!

## 2016-01-26 NOTE — Assessment & Plan Note (Signed)
New to provider, ongoing for pt.  Tolerating fenofibrate and Crestor w/o difficulty.  Stressed need for healthy diet and regular exercise.  Will follow.

## 2016-01-26 NOTE — Assessment & Plan Note (Signed)
New to provider, ongoing for pt.  On Metformin 1000mg  twice daily.  UTD on foot exam, eye exam.  Asymptomatic at this time.  Stressed need for healthy diet and regular exercise.  Check labs.  Adjust meds prn

## 2016-01-26 NOTE — Progress Notes (Signed)
   Subjective:    Patient ID: Travis [name], male    DOB: April 22, 1955, 61 y.o.   MRN: [id number]  HPI New to establish.  Previous MD- [facility]  [facility]- [facility])  DM- chronic problem, on Metformin 1000mg  BID.  On ACE for renal protection.  UTD on eye exam, foot exam.  Denies symptomatic lows.  Denies numbness/tingling of hands/feet.  Taking 81mg  ASA daily  HTN- chronic problem, on Metoprolol, lisinopril, lasix, amlodipine w/ good control.  No CP, SOB, HAs, visual changes, edema  Hyperlipidemia- chronic problem, on Fenofibrate and Crestor daily.  Denies abd pain, N/V  Obesity- chronic problem, BMI is 49.93.  Not able to exercise, using walker to ambulate due to chronic knee pain.  Attempting to work on walking w/o walker.   Review of Systems For ROS see HPI     Objective:   Physical Exam  Constitutional: He is oriented to person, place, and time. He appears well-developed and well-nourished. No distress.  Morbidly obese  HENT:  Head: Normocephalic and atraumatic.  Eyes: Conjunctivae and EOM are normal. Pupils are equal, round, and reactive to light.  Neck: Normal range of motion. Neck supple. No thyromegaly present.  Cardiovascular: Normal rate, regular rhythm, normal heart sounds and intact distal pulses.   No murmur heard. Pulmonary/Chest: Effort normal and breath sounds normal. No respiratory distress.  Abdominal: Soft. Bowel sounds are normal. He exhibits no distension.  Musculoskeletal: He exhibits no edema.  Lymphadenopathy:    He has no cervical adenopathy.  Neurological: He is alert and oriented to person, place, and time. No cranial nerve deficit.  Skin: Skin is warm and dry.  Psychiatric: He has a normal mood and affect. His behavior is normal.  Vitals reviewed.         Assessment & Plan:

## 2016-01-27 ENCOUNTER — Other Ambulatory Visit: Payer: Self-pay | Admitting: Family Medicine

## 2016-01-27 DIAGNOSIS — D72829 Elevated white blood cell count, unspecified: Secondary | ICD-10-CM

## 2016-01-27 LAB — CBC WITH DIFFERENTIAL/PLATELET
BASOS ABS: 0 {cells}/uL (ref 0–200)
Basophils Relative: 0 %
EOS ABS: 0 {cells}/uL — AB (ref 15–500)
Eosinophils Relative: 0 %
HEMATOCRIT: 33.3 % — AB (ref 38.5–50.0)
HEMOGLOBIN: 10.4 g/dL — AB (ref 13.2–17.1)
LYMPHS ABS: 1500 {cells}/uL (ref 850–3900)
Lymphocytes Relative: 10 %
MCH: 25.2 pg — AB (ref 27.0–33.0)
MCHC: 31.2 g/dL — ABNORMAL LOW (ref 32.0–36.0)
MCV: 80.8 fL (ref 80.0–100.0)
MONO ABS: 750 {cells}/uL (ref 200–950)
MPV: 10.5 fL (ref 7.5–12.5)
Monocytes Relative: 5 %
NEUTROS ABS: 12750 {cells}/uL — AB (ref 1500–7800)
Neutrophils Relative %: 85 %
Platelets: 151 10*3/uL (ref 140–400)
RBC: 4.12 MIL/uL — ABNORMAL LOW (ref 4.20–5.80)
RDW: 14.2 % (ref 11.0–15.0)
WBC: 15 10*3/uL — ABNORMAL HIGH (ref 3.8–10.8)

## 2016-01-27 LAB — HEMOGLOBIN A1C
Hgb A1c MFr Bld: 6.4 % — ABNORMAL HIGH (ref <5.7)
MEAN PLASMA GLUCOSE: 137 mg/dL

## 2016-02-08 ENCOUNTER — Other Ambulatory Visit: Payer: 59 | Admitting: *Deleted

## 2016-02-08 DIAGNOSIS — E785 Hyperlipidemia, unspecified: Secondary | ICD-10-CM | POA: Diagnosis not present

## 2016-02-08 DIAGNOSIS — I10 Essential (primary) hypertension: Secondary | ICD-10-CM | POA: Diagnosis not present

## 2016-02-08 NOTE — Addendum Note (Signed)
Addended by: Eulis Foster on: 02/08/2016 08:43 AM   Modules accepted: Orders

## 2016-02-09 DIAGNOSIS — R0689 Other abnormalities of breathing: Secondary | ICD-10-CM | POA: Diagnosis not present

## 2016-02-09 LAB — CBC WITH DIFFERENTIAL/PLATELET
BASOS ABS: 0 10*3/uL (ref 0.0–0.2)
Basos: 0 %
EOS (ABSOLUTE): 0 10*3/uL (ref 0.0–0.4)
Eos: 0 %
Hematocrit: 34.7 % — ABNORMAL LOW (ref 37.5–51.0)
Hemoglobin: 11 g/dL — ABNORMAL LOW (ref 13.0–17.7)
IMMATURE GRANS (ABS): 0.1 10*3/uL (ref 0.0–0.1)
Immature Granulocytes: 1 %
LYMPHS: 15 %
Lymphocytes Absolute: 1.6 10*3/uL (ref 0.7–3.1)
MCH: 24.8 pg — AB (ref 26.6–33.0)
MCHC: 31.7 g/dL (ref 31.5–35.7)
MCV: 78 fL — ABNORMAL LOW (ref 79–97)
Monocytes Absolute: 0.8 10*3/uL (ref 0.1–0.9)
Monocytes: 7 %
Neutrophils Absolute: 8.5 10*3/uL — ABNORMAL HIGH (ref 1.4–7.0)
Neutrophils: 77 %
PLATELETS: 163 10*3/uL (ref 150–379)
RBC: 4.43 x10E6/uL (ref 4.14–5.80)
RDW: 15.4 % (ref 12.3–15.4)
WBC: 11 10*3/uL — AB (ref 3.4–10.8)

## 2016-02-09 LAB — HEPATIC FUNCTION PANEL
ALT: 14 IU/L (ref 0–44)
AST: 20 IU/L (ref 0–40)
Albumin: 4 g/dL (ref 3.5–5.5)
Alkaline Phosphatase: 64 IU/L (ref 39–117)
BILIRUBIN TOTAL: 0.3 mg/dL (ref 0.0–1.2)
Bilirubin, Direct: 0.16 mg/dL (ref 0.00–0.40)
Total Protein: 6.7 g/dL (ref 6.0–8.5)

## 2016-02-09 LAB — LIPID PANEL
CHOL/HDL RATIO: 4.1 ratio (ref 0.0–5.0)
CHOLESTEROL TOTAL: 131 mg/dL (ref 100–199)
HDL: 32 mg/dL — AB (ref >39)
LDL CALC: 71 mg/dL (ref 0–99)
TRIGLYCERIDES: 141 mg/dL (ref 0–149)
VLDL CHOLESTEROL CAL: 28 mg/dL (ref 5–40)

## 2016-02-10 ENCOUNTER — Other Ambulatory Visit: Payer: Self-pay

## 2016-02-15 ENCOUNTER — Ambulatory Visit (INDEPENDENT_AMBULATORY_CARE_PROVIDER_SITE_OTHER): Payer: 59 | Admitting: Cardiology

## 2016-02-15 ENCOUNTER — Encounter: Payer: Self-pay | Admitting: Cardiology

## 2016-02-15 VITALS — BP 128/82 | HR 84 | Ht 69.0 in | Wt 341.0 lb

## 2016-02-15 DIAGNOSIS — E785 Hyperlipidemia, unspecified: Secondary | ICD-10-CM | POA: Diagnosis not present

## 2016-02-15 DIAGNOSIS — I2510 Atherosclerotic heart disease of native coronary artery without angina pectoris: Secondary | ICD-10-CM | POA: Diagnosis not present

## 2016-02-15 DIAGNOSIS — I480 Paroxysmal atrial fibrillation: Secondary | ICD-10-CM

## 2016-02-15 DIAGNOSIS — I10 Essential (primary) hypertension: Secondary | ICD-10-CM | POA: Diagnosis not present

## 2016-02-15 DIAGNOSIS — I4891 Unspecified atrial fibrillation: Secondary | ICD-10-CM

## 2016-02-15 DIAGNOSIS — I493 Ventricular premature depolarization: Secondary | ICD-10-CM

## 2016-02-15 NOTE — Patient Instructions (Addendum)
Medication Instructions:  Your physician recommends that you continue on your current medications as directed. Please refer to the Current Medication list given to you today.   Labwork: None  Testing/Procedures: Your physician has recommended that you wear an event monitor. Event monitors are medical devices that record the heart's electrical activity. Doctors most often [place] these monitors to diagnose arrhythmias. Arrhythmias are problems with the speed or rhythm of the heartbeat. The monitor is a small, portable device. You can wear one while you do your normal daily activities. This is usually used to diagnose what is causing palpitations/syncope (passing out).  Follow-Up: Your physician wants you to follow-up in: 1 year with Dr. [name]. You will receive a reminder letter in the mail two months in advance. If you don't receive a letter, please call our office to schedule the follow-up appointment.   Any Other Special Instructions Will Be Listed Below (If Applicable).     If you need a refill on your cardiac medications before your next appointment, please call your pharmacy.

## 2016-02-15 NOTE — Progress Notes (Signed)
Cardiology Office Note    Date:  02/15/2016   ID:  Travis [name], DOB 23-May-1955, MRN [id number]  PCP:  [name], MD  Cardiologist:  Fransico Him, MD   Chief Complaint  Patient presents with  . Coronary Artery Disease  . Hypertension  . Hyperlipidemia    History of Present Illness:  Travis [name] is a 61 y.o. male  with a history of ASCAD with MI in 1997 with PCI by Dr. Glade Lloyd, HTN, dyslipidemia and PVC's.  Since I saw him last he has had a panniculectomy and debridement of abscess and is doing well.  He has PAF while in the hospital at [facility] in September.  2D echo showed normal LVF with mild LVH and moderate pulmonary HTN with PASP 52mmHg.  He denies any chest pain or pressure, SOB, DOE (except with heavy walking), dizziness, palpitations or syncope.  He has chronic LE edema which is stable. I do not have a discharge summary but it does not appear he was placed on anticoagulation.    Past Medical History:  Diagnosis Date  . Appendicitis   . Blood in stool   . Coronary artery disease 1997   s/p PCI by Dr. Glade Lloyd  . Diverticulosis of colon with hemorrhage 2009  . Dyslipidemia 04/27/2014  . Dyslipidemia, goal LDL below 70   . Edema   . Encounter for long-term (current) use of other medications   . Essential hypertension, benign   . Family history of thyroid disease   . Frequent unifocal PVCs   . History of MI (myocardial infarction) 1997   Angioplasty  . Morbid obesity ([place])   . Myalgia and myositis, unspecified   . Prostate cancer ([place]) 2012  . Sleep apnea with use of continuous positive airway pressure (CPAP)   . Thrombocytopenia ([place]) 05/25/2014  . Type II or unspecified type diabetes mellitus without mention of complication, not stated as uncontrolled     Past Surgical History:  Procedure Laterality Date  . ANGIOPLASTY  1997   Dr. Glade Lloyd  . APPENDECTOMY      Current Medications: Outpatient Medications Prior to Visit  Medication  Sig Dispense Refill  . amLODipine (NORVASC) 10 MG tablet Take 10 mg by mouth daily.    [name] b complex vitamins tablet Take 1 tablet by mouth daily.    . fenofibrate micronized (LOFIBRA) 200 MG capsule Take 200 mg by mouth daily before breakfast.     . furosemide (LASIX) 20 MG tablet Take 20-40 mg by mouth daily as needed for edema (EDEMA).     . gabapentin (NEURONTIN) 100 MG capsule Take 1-2 capsules by mouth during the day and 1-2 capsules by mouth at night  2  . ibuprofen (ADVIL,MOTRIN) 200 MG tablet Take 200 mg by mouth as needed.    [name] lisinopril (PRINIVIL,ZESTRIL) 40 MG tablet Take 40 mg by mouth daily.    . Magnesium 250 MG TABS Take 1 tablet by mouth daily.    . metFORMIN (GLUCOPHAGE) 500 MG tablet Take 1,000 mg by mouth 2 (two) times daily with a meal.     . metoprolol succinate (TOPROL-XL) 100 MG 24 hr tablet TAKE 1 TABLET BY MOUTH ONCE DAILY WITH OR IMMEDIATELY FOLLOWING A MEAL 30 tablet 10  . Omega-3 Fatty Acids (SALMON OIL-1000 PO) Take 2 capsules by mouth 2 (two) times daily.    . rosuvastatin (CRESTOR) 10 MG tablet Take 1 tablet (10 mg total) by mouth daily. 30 tablet 11  . sertraline (  ZOLOFT) 25 MG tablet Take 25 mg by mouth daily.    . TRUE METRIX BLOOD GLUCOSE TEST test strip   1  . TRUEPLUS LANCETS 30G MISC   1  . vitamin C (ASCORBIC ACID) 500 MG tablet Take 500 mg by mouth daily.    [name] zinc gluconate 50 MG tablet Take 50 mg by mouth daily.     No facility-administered medications prior to visit.      Allergies:   Crestor [rosuvastatin] and Lipitor [atorvastatin]   Social History   Social History  . Marital status: Married    Spouse name: N/A  . Number of children: N/A  . 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 Smoking status: Never Smoker  . Smokeless tobacco: Never Used  . Alcohol use No  . Drug use: No  . Sexual activity: Not Asked   Other Topics Concern  . None   Social  History Narrative  . None     Family History:  The patient's family history includes CVA in his father; Diabetes in his mother; Hypertension in his father and sister; Hypothyroidism in his sister.   ROS:   Please see the history of present illness.    ROS All other systems reviewed and are negative.  No flowsheet data found.     PHYSICAL EXAM:   VS:  BP 128/82 (BP Location: Right Arm, Patient Position: Sitting, Cuff Size: Large)   Pulse 84   Ht 5\' 9"  (1.753 m)   Wt (!) 341 lb (154.7 kg)   BMI 50.36 kg/m    GEN: Well nourished, well developed, in no acute distress  HEENT: normal  Neck: no JVD, carotid bruits, or masses Cardiac: RRR; no murmurs, rubs, or gallops,no edema.  Intact distal pulses bilaterally.  Respiratory:  clear to auscultation bilaterally, normal work of breathing GI: soft, nontender, nondistended, + BS MS: no deformity or atrophy  Skin: warm and dry, no rash Neuro:  Alert and Oriented x 3, Strength and sensation are intact Psych: euthymic mood, full affect  Wt Readings from Last 3 Encounters:  02/15/16 (!) 341 lb (154.7 kg)  01/26/16 (!) 338 lb 2 oz (153.4 kg)  11/22/15 (!) 331 lb (150.1 kg)      Studies/Labs Reviewed:   EKG:  EKG is  ordered today and showed NSR at 95bpm with PACs and atrial couplets and nonspecific ST abnormality  Recent Labs: 01/26/2016: BUN 21; Creat 0.95; Hemoglobin 10.4; Potassium 3.8; Sodium 137; TSH 1.48 02/08/2016: ALT 14; Platelets 163   Lipid Panel    Component Value Date/Time   CHOL 131 02/08/2016 0841   TRIG 141 02/08/2016 0841   HDL 32 (L) 02/08/2016 0841   CHOLHDL 4.1 02/08/2016 0841   CHOLHDL 4.4 01/26/2016 1520   VLDL 33 (H) 01/26/2016 1520   LDLCALC 71 02/08/2016 0841    Additional studies/ records that were reviewed today include:  none    ASSESSMENT:    1. Coronary artery disease involving native coronary artery of native heart without angina pectoris   2. Essential hypertension   3. Frequent  unifocal PVCs   4. Dyslipidemia      PLAN:  In order of problems listed above:  1. ASCAD s/p remote MI and PCI in 1997 with no anginal symptoms.  He will continue on statin/BB/ASA.   2. HTN - BP controlled on current meds.  He will continue  on amlodipine/ACE I and BB.  BMET 11/2016 stable. 3. PVC's - he is asymptomatic.  Continue BB for suppression.  4. Dyslipidemia with LDL goal < 70. Continue statin.  LDL at goal at 71 on lipids 01/2016 5. Post op atrial fibrillation after panniculectomy in Sept 2017.  I will get records from [facility] since his CHADS2VASC score is 2.  I will get a heart monitor to assess for silent afib.  Will not anticoagulate now unless he has any further episodes.     Medication Adjustments/Labs and Tests Ordered: Current medicines are reviewed at length with the patient today.  Concerns regarding medicines are outlined above.  Medication changes, Labs and Tests ordered today are listed in the Patient Instructions below.  Patient Instructions  Medication Instructions:  Your physician recommends that you continue on your current medications as directed. Please refer to the Current Medication list given to you today.   Labwork: None  Testing/Procedures: None  Follow-Up: Your physician wants you to follow-up in: 1 year with Dr. [name]. You will receive a reminder letter in the mail two months in advance. If you don't receive a letter, please call our office to schedule the follow-up appointment.   Any Other Special Instructions Will Be Listed Below (If Applicable).     If you need a refill on your cardiac medications before your next appointment, please call your pharmacy.      Signed, Fransico Him, MD  02/15/2016 4:03 PM    [facility] [place], [place], [place]  [address] Phone: [phone]; Fax: [phone]

## 2016-02-22 ENCOUNTER — Ambulatory Visit (INDEPENDENT_AMBULATORY_CARE_PROVIDER_SITE_OTHER): Payer: 59

## 2016-02-22 DIAGNOSIS — I480 Paroxysmal atrial fibrillation: Secondary | ICD-10-CM | POA: Diagnosis not present

## 2016-03-07 ENCOUNTER — Other Ambulatory Visit: Payer: Self-pay | Admitting: General Practice

## 2016-03-07 MED ORDER — FUROSEMIDE 20 MG PO TABS
20.0000 mg | ORAL_TABLET | Freq: Two times a day (BID) | ORAL | 6 refills | Status: DC | PRN
Start: 2016-03-07 — End: 2017-03-20

## 2016-03-07 MED ORDER — GABAPENTIN 100 MG PO CAPS: ORAL_CAPSULE | ORAL | 6 refills | Status: DC

## 2016-03-07 MED ORDER — METFORMIN HCL 500 MG PO TABS: 1000.0000 mg | ORAL_TABLET | Freq: Two times a day (BID) | ORAL | 1 refills | Status: DC

## 2016-03-07 MED ORDER — FENOFIBRATE MICRONIZED 200 MG PO CAPS: 200.0000 mg | ORAL_CAPSULE | Freq: Every day | ORAL | 1 refills | Status: DC

## 2016-03-07 MED ORDER — LISINOPRIL 40 MG PO TABS: 40.0000 mg | ORAL_TABLET | Freq: Every day | ORAL | 1 refills | Status: DC

## 2016-03-07 MED FILL — FUROSEMIDE 20 MG TABLET: 20 | 15 days supply | Qty: 60 | Fill #0

## 2016-03-07 MED FILL — LISINOPRIL 40 MG TABLET: 40 | 90 days supply | Qty: 90 | Fill #0

## 2016-03-07 MED FILL — metFORMIN HCL 500 MG TABS: 500 | 90 days supply | Qty: 360 | Fill #0

## 2016-03-07 MED FILL — FENOFIBRATE 200 MG CAPSULE: 200 | 90 days supply | Qty: 90 | Fill #0

## 2016-03-07 MED FILL — GABAPENTIN 100 MG CAPSULE: 100 | 30 days supply | Qty: 120 | Fill #0

## 2016-03-08 MED FILL — METOPROLOL SUCC ER 100 MG T: 100 | 90 days supply | Qty: 90 | Fill #0

## 2016-03-20 ENCOUNTER — Other Ambulatory Visit: Payer: Self-pay | Admitting: General Practice

## 2016-03-20 MED ORDER — AMLODIPINE BESYLATE 10 MG PO TABS: 10.0000 mg | ORAL_TABLET | Freq: Every day | ORAL | 1 refills | Status: DC

## 2016-03-20 MED FILL — AMLODIPINE BESYLATE 10 MG T: 10 | 90 days supply | Qty: 90 | Fill #0

## 2016-04-23 ENCOUNTER — Ambulatory Visit: Payer: 59 | Attending: Audiology | Admitting: Audiology

## 2016-04-23 DIAGNOSIS — R292 Abnormal reflex: Secondary | ICD-10-CM | POA: Insufficient documentation

## 2016-04-23 DIAGNOSIS — H9192 Unspecified hearing loss, left ear: Secondary | ICD-10-CM | POA: Diagnosis not present

## 2016-04-23 DIAGNOSIS — R94128 Abnormal results of other function studies of ear and other special senses: Secondary | ICD-10-CM | POA: Insufficient documentation

## 2016-04-23 DIAGNOSIS — R2689 Other abnormalities of gait and mobility: Secondary | ICD-10-CM | POA: Diagnosis not present

## 2016-04-23 DIAGNOSIS — Z01118 Encounter for examination of ears and hearing with other abnormal findings: Secondary | ICD-10-CM | POA: Insufficient documentation

## 2016-04-23 DIAGNOSIS — H90A11 Conductive hearing loss, unilateral, right ear with restricted hearing on the contralateral side: Secondary | ICD-10-CM | POA: Diagnosis not present

## 2016-04-23 DIAGNOSIS — H93299 Other abnormal auditory perceptions, unspecified ear: Secondary | ICD-10-CM | POA: Diagnosis not present

## 2016-04-23 MED FILL — ROSUVASTATIN CALCIUM 10 MG: 10 | 90 days supply | Qty: 90 | Fill #2

## 2016-04-23 NOTE — Procedures (Signed)
Outpatient Audiology and [place]  [place], [place] [address]  [phone]   Audiological Evaluation  Patient Name: Travis [name]   Status: Outpatient   DOB: 1955/03/03    Diagnosis: Hearing Loss           MRN: [id number] Date:  04/23/2016     Referent: [name], MD  History: TONNY ISENSEE was seen for an audiological evaluation. Accompanied by: His wife Primary Concern: Hearing Loss, having difficulty hearing words sometimes - depends on the pitch. Pain: None History of hearing problems: Y   History of ear infections:   N History of dizziness/vertigo:   Y - in the past, a long time ago. History of balance issues:  Has knee problems - uses a walker at times.   Tinnitus: Tinnitus, sometimes like a high pitched whistle. 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 History of hypertension: Y- controlled with medication. History of diabetes:  Y - takes metformin. Family history of hearing loss: No    Evaluation: Conventional pure tone audiometry from 250Hz  - 8000Hz  with using insert earphones.  Hearing Thresholds: Right ear:  Thresholds of  60 dBHL at 250Hz ; 45 dbHL at 500Hz ; 20-25 dBHL from 750Hz  - 4000Hz ; 30 dBHL at 8000Hz . The low frequency hearing loss appears conductive.  Left ear:    Thresholds of 15-25 dBHL from 250Hz  - 4000Hz ; 75 dBHL at 6000Hz  and 80 dBHL at 8000Hz . The hearing loss appears mixed in the high frequencies. Reliability is good Speech reception levels (repeating words near threshold) using recorded spondee word lists:  Right ear: 25 dBHL.  Left ear:  25 dBHL Word recognition (at comfortably loud volumes) using recorded word lists at 65 dBHL (conversational speech levels), in quiet.  Right ear: 100%.  Left ear:   100% Word recognition in minimal background noise:  +5 dBHL  Right ear: 68%                     Left ear:  50%  Tympanometry (middle ear function) with ipsilateral acoustic reflexes.  Right ear: A large volume of (2.3cc) with normal compliance and pressure  (Type A). Ipsilateral acoustic reflexes are 95 dBHL from 500Hz  - 4000Hz .   Left ear: Normal middle ear volume, pressure and compliance (Type A) with ipsilateral acoustic reflexes of 100 dBHL at 500Hz ; 85 dBHL at 1000Hz ; 95 dB at 2000Hz  and no response at 4000Hz .   CONCLUSION:      WARNER LADUCA has asymmetrical hearing thresholds in each ear. Further evaluation by an ENT is recommended.  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  The right ear has a moderate to severe low frequency conductive hearing loss improving to normal to a slight high frequency hearing loss. This amount of hearing loss will adversely affect speech communication at normal conversational speech levels. Word recognition is excellent in each ear in quiet at conversational speech levels. In minimal background noise, word recognition drops to poor in each ear, poorer on the left side. Significant difficulty hearing when competing message are present is expected.   BERNARR LONGSWORTH also has poor balance - referral to PT for assessment is recommended. The test results were discussed and EZRAH PANNING counseled.   RECOMMENDATIONS: 1.   Referral to an Ear,  Nose and Throat physician because of the a) asymmetrical hearing loss b) large middle ear volume in the right ear with right low frequency conductive hearing loss and c) sharply sloping left high frequency hearing loss. 2.   Monitor hearing closely with a repeat audiological evaluation in 3-6 months (earlier if there is any change in hearing or ear pressure).  This appointment may be scheduled here or at the ENT office. 3.   Referral to the [facility] for balance assessment.  4.  Strategies  that help improve hearing include: A) Face the speaker directly. Optimal is having the speakers face well - lit.  Unless amplified, being within 3-6 feet of the speaker will enhance word recognition. B) Avoid having the speaker back-lit as this will minimize the ability to use cues from lip-reading, facial expression and gestures. C)  Word recognition is poorer in background noise. For optimal word recognition, turn off the TV, radio or noisy fan when engaging in conversation. In a restaurant, try to sit away from noise sources and close to the primary speaker.  D)  Ask for topic clarification from time to time in order to remain in the conversation.  Most people don't mind repeating or clarifying a point when asked.  If needed, explain the difficulty hearing in background noise or hearing loss. 5.   Use hearing protection during noisy activities such as using a weed eater, moving the lawn, shooting, etc.    Musician's plugs, are available from [employer] for music related hearing protection because there is no distortion.  Other hearing protection, such as sponge plugs (available at pharmacies) or earmuffs (available at sporting goods stores or department stores such as Paediatric nurse) are useful for noisy activities and venues. 6.   Chadric reports difficulty with word recognition on the telephone. [employer] was contacted for an amplified telephone.   Deborah L. Heide Spark, Au.D., CCC-A Doctor of Audiology 04/23/2016   cc: [name], MD

## 2016-04-24 ENCOUNTER — Other Ambulatory Visit: Payer: Self-pay | Admitting: Family Medicine

## 2016-04-24 DIAGNOSIS — IMO0001 Reserved for inherently not codable concepts without codable children: Secondary | ICD-10-CM

## 2016-04-24 DIAGNOSIS — H918X1 Other specified hearing loss, right ear: Secondary | ICD-10-CM

## 2016-04-24 DIAGNOSIS — R269 Unspecified abnormalities of gait and mobility: Secondary | ICD-10-CM

## 2016-04-24 DIAGNOSIS — R2689 Other abnormalities of gait and mobility: Secondary | ICD-10-CM

## 2016-04-30 ENCOUNTER — Other Ambulatory Visit: Payer: Self-pay

## 2016-04-30 NOTE — Patient Outreach (Signed)
Travis [name] [facility]) Care Management  04/30/2016  Travis [name] 1955-12-31 [id number]  Subjective: Patient presents today for 3 month diabetes follow-up as part of the employer-sponsored Link to Wellness program.  Current diabetes regimen includes metformin.  Patient also continues on daily lisinopril and rosuvastatin.  Most recent MD follow-up was 01/26/16.  Patient has a pending appt for 06/13/16 with Dr. Birdie [name].  No med changes or major health changes at this time.  Although, patient had an extensive and complex surgical stay in September in [place] for an abscess.   Patient reported dietary habits: Eats 3 meals/day Breakfast: PB crackers Lunch: sandwich and potato salad (double sides) from [employer]: light dinners - cheese and crackers, pudding cups Snacks: rarely - chips, animal crackers Drinks: water, Cheerwine/Ginger ale/Sprite Zero (1-2 per day), coffee (during cold months)  Patient reported exercise habits: Currently using work as physical activity. 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 At home, he uses a walker to assist him in getting around, although he has been trying to use it less.   Patient denies hypoglycemic events. Lowest values in 80s.  Patient reports nocturia - only some nights, prostate cancer history.  Patient denies pain/burning upon urination.  Patient denies neuropathy. Patient denies visual changes. Patient reports self foot exams. No changes or concerns reported.   Patient reported self monitored blood glucose frequency daily in AM.  Home fasting CBG: lowest 89, highest 197 in last 3 months; 30 day average 162  * Of note, patient works an early shift, getting up around 2-3 am to check BG, and numbers are higher than if he sleeps in and checks around 8am with no differences in diet  Objective:  Lab Results  Component Value Date   HGBA1C 6.4 (H) 01/26/2016   There were no  vitals filed for this visit.  Lipid Panel     Component Value Date/Time   CHOL 131 02/08/2016 0841   TRIG 141 02/08/2016 0841   HDL 32 (L) 02/08/2016 0841   CHOLHDL 4.1 02/08/2016 0841   CHOLHDL 4.4 01/26/2016 1520   VLDL 33 (H) 01/26/2016 1520   LDLCALC 71 02/08/2016 0841   10 year ASCVD risk: 16%  Assessment:  Diabetes: Most recent A1C was 6.4% which is at  goal of less than 7%.   Lifestyle improvements:  Physical Activity- seems appropriate given the surgery from 09/2015 that he and his wife reports has deconditioned him some. Could be a future goal for improvement. Would like to work with PT to build strength and balance, PCP already aware of referral request.   Nutrition- reported diet could be improved upon. Although, patient reports little to no appetite or cravings. Emphasized the importance of portion control and a balanced diet today.    Plan/Goals for Next Visit: Please see goals below.   Next appointment for Link to Wellness: 6 months  Travis [name], PharmD PGY1 Resident 04/30/2016 4:06 PM   [facility] CM Care Plan Problem One     Most Recent Value  Care Plan Problem One  Diet and Weight Loss  Role Documenting the Problem One  Clinical Pharmacist  Care Plan for Problem One  Active  THN Long Term Goal (31-90 days)  Eat more frequent, smaller meals - focusing on portion control     [facility] CM Care Plan Problem Two     Most Recent Value  Care Plan Problem Two  Physical Activity  Role Documenting the  Problem Two  Clinical Pharmacist  Care Plan for Problem Two  Active  THN Long Term Goal (31-90) days  Work with PT to build strength and balance

## 2016-05-01 DIAGNOSIS — Z8546 Personal history of malignant neoplasm of prostate: Secondary | ICD-10-CM | POA: Diagnosis not present

## 2016-05-08 DIAGNOSIS — Z8546 Personal history of malignant neoplasm of prostate: Secondary | ICD-10-CM | POA: Diagnosis not present

## 2016-05-14 ENCOUNTER — Ambulatory Visit: Payer: 59 | Admitting: Physical Therapy

## 2016-05-14 DIAGNOSIS — R292 Abnormal reflex: Secondary | ICD-10-CM | POA: Diagnosis not present

## 2016-05-14 DIAGNOSIS — H9192 Unspecified hearing loss, left ear: Secondary | ICD-10-CM | POA: Diagnosis not present

## 2016-05-14 DIAGNOSIS — H90A11 Conductive hearing loss, unilateral, right ear with restricted hearing on the contralateral side: Secondary | ICD-10-CM | POA: Diagnosis not present

## 2016-05-14 DIAGNOSIS — Z01118 Encounter for examination of ears and hearing with other abnormal findings: Secondary | ICD-10-CM | POA: Diagnosis not present

## 2016-05-14 DIAGNOSIS — R94128 Abnormal results of other function studies of ear and other special senses: Secondary | ICD-10-CM | POA: Diagnosis not present

## 2016-05-14 DIAGNOSIS — H93299 Other abnormal auditory perceptions, unspecified ear: Secondary | ICD-10-CM | POA: Diagnosis not present

## 2016-05-14 DIAGNOSIS — R2689 Other abnormalities of gait and mobility: Secondary | ICD-10-CM | POA: Diagnosis not present

## 2016-05-14 NOTE — Therapy (Signed)
[facility] [address] [place], [place], [address] Phone: [phone]   Fax:  [phone]  Physical Therapy Evaluation  Patient Details  Name: Travis [name] MRN: [id number] Date of Birth: 10/07/1955 Referring Provider: Dr. [name]  Encounter Date: 05/14/2016      PT End of Session - 05/14/16 1340    Visit Number 1   Number of Visits 1   Authorization Type UMR   PT Start Time 6387   PT Stop Time 1149   PT Time Calculation (min) 46 min      Past Medical History:  Diagnosis Date  . Appendicitis   . Blood in stool   . Coronary artery disease 1997   s/p PCI by Dr. Glade Lloyd  . Diverticulosis of colon with hemorrhage 2009  . Dyslipidemia, goal LDL below 70   . Edema   . Encounter for long-term (current) use of other medications   . Essential hypertension, benign   . Family history of thyroid disease   . Frequent unifocal PVCs   . Morbid obesity ([place])   . Myalgia and myositis, unspecified   . Prostate cancer ([place]) 2012  . Sleep apnea with use of continuous positive airway pressure (CPAP)   . Thrombocytopenia ([place]) 05/25/2014  . Type II or unspecified type diabetes mellitus without mention of complication, not stated as uncontrolled     Past Surgical History:  Procedure Laterality Date  . ANGIOPLASTY  1997   Dr. Glade Lloyd  . APPENDECTOMY      There were no vitals filed for this visit.       Subjective Assessment - 05/14/16 1329    Subjective Pt reports he had hearing test done at audiologist's office and had some unsteadiness with steppping in and out of the sound booth - states that is why she referred him to PT   Pertinent History Type 2 DM:  h/o MI 1997:  surgery for abdominal tumor (abscessed) in Sept. 2017   Patient Stated Goals unsure of goals - states his balance is affected by his "bad knees"            [facility] PT Assessment - 05/14/16 1127      Assessment   Medical Diagnosis Gait  abnormality   Referring Provider Dr. [name]   Onset Date/Surgical Date --  Sept. 2017     Precautions   Precautions None     Restrictions   Weight Bearing Restrictions No     Balance Screen   Has the patient fallen in the past 6 months No   Has the patient had a decrease in activity level because of a fear of falling?  No   Is the patient reluctant to leave their home because of a fear of falling?  No     Home Environment   Living Environment Private residence   Type of Home Apartment   Home Access Level entry   Home Layout One level     Prior Function   Level of Independence Independent with transfers;Independent with community mobility with device;Independent with household mobility without device;Independent with basic ADLs     Ambulation/Gait   Ambulation/Gait Yes   Ambulation/Gait Assistance 6: Modified independent (Device/Increase time)   Ambulation Distance (Feet) 100 Feet   Assistive device Rolling walker   Gait Pattern Step-through pattern   Ambulation Surface Level;Indoor   Gait velocity 32.8/ 9.97 = 3.29     Standardized Balance Assessment   Standardized Balance Assessment Berg Balance  Test;Timed Up and Go Test     Berg Balance Test   Sit to Stand Able to stand without using hands and stabilize independently   Standing Unsupported Able to stand safely 2 minutes   Sitting with Back Unsupported but Feet Supported on Floor or Stool Able to sit safely and securely 2 minutes   Stand to Sit Sits safely with minimal use of hands   Transfers Able to transfer safely, minor use of hands   Standing Unsupported with Eyes Closed Able to stand 10 seconds safely   Standing Ubsupported with Feet Together Able to place feet together independently and stand 1 minute safely   From Standing, Reach Forward with Outstretched Arm Can reach confidently >25 cm (10")   From Standing Position, Pick up Object from Floor Able to pick up shoe safely and easily   From Standing  Position, Turn to Look Behind Over each Shoulder Looks behind from both sides and weight shifts well   Turn 360 Degrees Able to turn 360 degrees safely in 4 seconds or less   Standing Unsupported, Alternately Place Feet on Step/Stool Able to stand independently and safely and complete 8 steps in 20 seconds   Standing Unsupported, One Foot in Front Able to place foot tandem independently and hold 30 seconds   Standing on One Leg Tries to lift leg/unable to hold 3 seconds but remains standing independently   Total Score 53     Timed Up and Go Test   Normal TUG (seconds) 11.91  with RW   TUG Comments 9.91 secs TUG without RW                           PT Education - 05/14/16 1338    Education provided Yes   Education Details eval results - recommended pt to do SLS and tandem stance to maintain high level balance skills   Person(s) Educated Patient   Methods Explanation   Comprehension Verbalized understanding;Returned demonstration                    Plan - 05/14/16 1340    Clinical Impression Statement Pt is a 61 year old male with morbid obesity and 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  Pt is using a RW for assistance with community ambulation but is able to amb in his home without deevice.  Pt has no significant balance deficits which warrant skilled PT intervention at this time. Pt is not at fall risk per TUG score and BERG score.  Pt does need to continue to use RW to decrease stress on bil. knee joints with prolonged ambulation distances.  Pt verbalizes understanding and agreement with PT eval findings.                                                                                          PT Frequency One time visit   PT Treatment/Interventions Patient/family education;ADLs/Self Care Home Management   PT Next Visit Plan N/A - eval only   PT Home Exercise Plan SLS and tandem stance   Consulted  and Agree with Plan of Care Patient       Patient will benefit from skilled therapeutic intervention in order to improve the following deficits and impairments:  Difficulty walking, Decreased balance, Obesity  Visit Diagnosis: Other abnormalities of gait and mobility - Plan: PT plan of care cert/re-cert     Problem List Patient Active Problem List   Diagnosis Date Noted  . Obstructive sleep apnea 06/21/2014  . Personal history of malignant neoplasm of prostate 06/21/2014  . Thrombocytopenia ([place]) 05/25/2014  . Dyslipidemia 04/27/2014  . Morbid obesity ([place]) 06/02/2013  . Frequent unifocal PVCs   . Coronary artery disease   . Hypertension   . Diabetes ([place]) 10/27/2012    Kaleel Schmieder, [place], PT 05/14/2016, 1:49 PM  [facility] [address] [place], [place], [address] Phone: [phone]   Fax:  [phone]  Name: Travis [name] MRN: [id number] Date of Birth: 1955-05-18

## 2016-05-28 DIAGNOSIS — H6122 Impacted cerumen, left ear: Secondary | ICD-10-CM | POA: Diagnosis not present

## 2016-05-28 DIAGNOSIS — H903 Sensorineural hearing loss, bilateral: Secondary | ICD-10-CM | POA: Diagnosis not present

## 2016-06-04 DIAGNOSIS — H40053 Ocular hypertension, bilateral: Secondary | ICD-10-CM | POA: Diagnosis not present

## 2016-06-06 MED FILL — GABAPENTIN 100 MG CAPSULE: 100 | 30 days supply | Qty: 120 | Fill #1

## 2016-06-06 MED FILL — LISINOPRIL 40 MG TAB: 40 | 90 days supply | Qty: 90 | Fill #1

## 2016-06-06 MED FILL — FUROSEMIDE 20 MG TABLET: 20 | 15 days supply | Qty: 60 | Fill #1

## 2016-06-06 MED FILL — FENOFIBRATE 200 MG CAPSULE: 200 | 90 days supply | Qty: 90 | Fill #1

## 2016-06-06 MED FILL — metFORMIN HCL 500 MG TABS: 500 | 90 days supply | Qty: 360 | Fill #1

## 2016-06-06 MED FILL — METOPROLOL SUCC ER 100 MG T: 100 | 90 days supply | Qty: 90 | Fill #1

## 2016-06-13 ENCOUNTER — Encounter: Payer: Self-pay | Admitting: Family Medicine

## 2016-06-13 ENCOUNTER — Ambulatory Visit (INDEPENDENT_AMBULATORY_CARE_PROVIDER_SITE_OTHER): Payer: 59 | Admitting: Family Medicine

## 2016-06-13 VITALS — BP 130/82 | HR 81 | Temp 98.2°F | Resp 16 | Ht 69.0 in | Wt 332.2 lb

## 2016-06-13 DIAGNOSIS — Z23 Encounter for immunization: Secondary | ICD-10-CM | POA: Diagnosis not present

## 2016-06-13 DIAGNOSIS — E119 Type 2 diabetes mellitus without complications: Secondary | ICD-10-CM | POA: Diagnosis not present

## 2016-06-13 DIAGNOSIS — Z0000 Encounter for general adult medical examination without abnormal findings: Secondary | ICD-10-CM | POA: Insufficient documentation

## 2016-06-13 LAB — BASIC METABOLIC PANEL
BUN: 30 mg/dL — ABNORMAL HIGH (ref 6–23)
CALCIUM: 9.6 mg/dL (ref 8.4–10.5)
CO2: 31 meq/L (ref 19–32)
CREATININE: 1.36 mg/dL (ref 0.40–1.50)
Chloride: 99 mEq/L (ref 96–112)
GFR: 56.81 mL/min — ABNORMAL LOW (ref >60.00)
Glucose, Bld: 107 mg/dL — ABNORMAL HIGH (ref 70–99)
Potassium: 4.4 mEq/L (ref 3.5–5.1)
Sodium: 137 mEq/L (ref 135–145)

## 2016-06-13 LAB — CBC WITH DIFFERENTIAL/PLATELET
BASOS PCT: 0.2 % (ref 0.0–3.0)
Basophils Absolute: 0 10*3/uL (ref 0.0–0.1)
EOS ABS: 0 10*3/uL (ref 0.0–0.7)
EOS PCT: 0.2 % (ref 0.0–5.0)
HEMATOCRIT: 39.8 % (ref 39.0–52.0)
Hemoglobin: 13.5 g/dL (ref 13.0–17.0)
LYMPHS PCT: 16.2 % (ref 12.0–46.0)
Lymphs Abs: 1.7 10*3/uL (ref 0.7–4.0)
MCHC: 33.8 g/dL (ref 30.0–36.0)
MCV: 85.3 fl (ref 78.0–100.0)
MONOS PCT: 7.4 % (ref 3.0–12.0)
Monocytes Absolute: 0.8 10*3/uL (ref 0.1–1.0)
NEUTROS ABS: 7.9 10*3/uL — AB (ref 1.4–7.7)
Neutrophils Relative %: 76 % (ref 43.0–77.0)
PLATELETS: 100 10*3/uL — AB (ref 150.0–400.0)
RBC: 4.67 Mil/uL (ref 4.22–5.81)
RDW: 14.5 % (ref 11.5–15.5)
WBC: 10.3 10*3/uL (ref 4.0–10.5)

## 2016-06-13 LAB — LIPID PANEL
CHOL/HDL RATIO: 5
Cholesterol: 145 mg/dL (ref 0–200)
HDL: 31.2 mg/dL — ABNORMAL LOW (ref >39.00)
LDL Cholesterol: 76 mg/dL (ref 0–99)
NonHDL: 113.71
TRIGLYCERIDES: 189 mg/dL — AB (ref 0.0–149.0)
VLDL: 37.8 mg/dL (ref 0.0–40.0)

## 2016-06-13 LAB — HEPATIC FUNCTION PANEL
ALT: 24 U/L (ref 0–53)
AST: 29 U/L (ref 0–37)
Albumin: 4.4 g/dL (ref 3.5–5.2)
Alkaline Phosphatase: 47 U/L (ref 39–117)
BILIRUBIN DIRECT: 0.2 mg/dL (ref 0.0–0.3)
BILIRUBIN TOTAL: 0.6 mg/dL (ref 0.2–1.2)
Total Protein: 7.2 g/dL (ref 6.0–8.3)

## 2016-06-13 LAB — TSH: TSH: 1.53 u[IU]/mL (ref 0.35–4.50)

## 2016-06-13 LAB — HEMOGLOBIN A1C: Hgb A1c MFr Bld: 7.2 % — ABNORMAL HIGH (ref 4.6–6.5)

## 2016-06-13 NOTE — Assessment & Plan Note (Signed)
Pt's PE WNL w/ exception of severe obesity.  Due for colonoscopy next year.  Tdap updated.  Stressed need for healthy diet and regular exercise.  Check labs.  Anticipatory guidance provided.

## 2016-06-13 NOTE — Progress Notes (Signed)
   Subjective:    Patient ID: Travis [name], male    DOB: 1955-06-21, 61 y.o.   MRN: [id number]  HPI PE- due for repeat colonoscopy 2019.  UTD on flu, Pneumovax.  Due for Tdap.  UTD on urology (Dr Karsten Ro).   Review of Systems Patient reports no vision/hearing changes, anorexia, fever ,adenopathy, persistant/recurrent hoarseness, swallowing issues, chest pain, palpitations, edema, persistant/recurrent cough, hemoptysis, dyspnea (rest,exertional, paroxysmal nocturnal), gastrointestinal  bleeding (melena, rectal bleeding), abdominal pain, excessive heart burn, GU symptoms (dysuria, hematuria, voiding/incontinence issues) syncope, focal weakness, memory loss, numbness & tingling, skin/hair/nail changes, depression, anxiety, abnormal bruising/bleeding, musculoskeletal symptoms/signs.     Objective:   Physical Exam General Appearance:    Alert, cooperative, no distress, appears stated age, obese  Head:    Normocephalic, without obvious abnormality, atraumatic  Eyes:    PERRL, conjunctiva/corneas clear, EOM's intact, fundi    benign, both eyes       Ears:    Normal TM's and external ear canals, both ears  Nose:   Nares normal, septum midline, mucosa normal, no drainage   or sinus tenderness  Throat:   Lips, mucosa, and tongue normal; teeth and gums normal  Neck:   Supple, symmetrical, trachea midline, no adenopathy;       thyroid:  No enlargement/tenderness/nodules  Back:     Symmetric, no curvature, ROM normal, no CVA tenderness  Lungs:     Clear to auscultation bilaterally, respirations unlabored  Chest wall:    No tenderness or deformity  Heart:    Regular rate and rhythm, S1 and S2 normal, no murmur, rub   or gallop  Abdomen:     Soft, non-tender, bowel sounds active all four quadrants,    no masses, no organomegaly  Genitalia:    Deferred to Dr Karsten Ro  Rectal:    Extremities:   Extremities normal, atraumatic, no cyanosis or edema  Pulses:   2+ and symmetric all extremities  Skin:    Skin color, texture, turgor normal, no rashes or lesions  Lymph nodes:   Cervical, supraclavicular, and axillary nodes normal  Neurologic:   CNII-XII intact. Normal strength, sensation and reflexes      throughout          Assessment & Plan:

## 2016-06-13 NOTE — Patient Instructions (Signed)
Follow up in 3-4 months to recheck diabetes We'll notify you of your lab results and make any changes if needed Continue to work on healthy diet and regular exercise- you can do it! You are now up to date on Tdap- yay! Please ask about your next colonoscopy Call with any questions or concerns Have a great summer!!!

## 2016-06-13 NOTE — Assessment & Plan Note (Signed)
Chronic problem.  Hx of good control.  UTD on eye exam.  Foot exam done today.  On ACE for renal protection.  Check labs.  Adjust meds prn  

## 2016-06-13 NOTE — Progress Notes (Signed)
Pre visit review using our clinic review tool, if applicable. No additional management support is needed unless otherwise documented below in the visit note. 

## 2016-06-13 NOTE — Addendum Note (Signed)
Addended by: Davis Gourd on: 06/13/2016 02:22 PM   Modules accepted: Orders

## 2016-06-14 ENCOUNTER — Other Ambulatory Visit: Payer: Self-pay | Admitting: General Practice

## 2016-06-14 MED ORDER — SITAGLIPTIN PHOSPHATE 100 MG PO TABS: 100.0000 mg | ORAL_TABLET | Freq: Every day | ORAL | 1 refills | Status: DC

## 2016-06-14 MED FILL — JANUVIA 100 MG TABLET: 100 | 90 days supply | Qty: 90 | Fill #0

## 2016-06-20 MED FILL — AMLODIPINE BESYLATE 10 MG T: 10 | 90 days supply | Qty: 90 | Fill #1

## 2016-06-28 ENCOUNTER — Telehealth: Payer: Self-pay | Admitting: Internal Medicine

## 2016-06-28 DIAGNOSIS — G4733 Obstructive sleep apnea (adult) (pediatric): Secondary | ICD-10-CM

## 2016-06-28 NOTE — Telephone Encounter (Signed)
Fax is in [place] look at, will forward to her to follow up on

## 2016-06-28 NOTE — Telephone Encounter (Signed)
Ok to continue CPAP, mask of choice, humidifier, supplies, [employer]    Dx OSA

## 2016-06-28 NOTE — Telephone Encounter (Signed)
Checked fax machine up front since its only been 5 mins since message was placed and at that time no fax had some through. Will await fax

## 2016-06-29 NOTE — Telephone Encounter (Signed)
Order placed for CPAP supplies Pt aware. Nothing further needed.

## 2016-07-05 DIAGNOSIS — R0689 Other abnormalities of breathing: Secondary | ICD-10-CM | POA: Diagnosis not present

## 2016-07-30 ENCOUNTER — Encounter: Payer: Self-pay | Admitting: Family Medicine

## 2016-07-30 ENCOUNTER — Ambulatory Visit (INDEPENDENT_AMBULATORY_CARE_PROVIDER_SITE_OTHER): Payer: 59 | Admitting: Family Medicine

## 2016-07-30 DIAGNOSIS — M5416 Radiculopathy, lumbar region: Secondary | ICD-10-CM | POA: Insufficient documentation

## 2016-07-30 DIAGNOSIS — G4700 Insomnia, unspecified: Secondary | ICD-10-CM

## 2016-07-30 DIAGNOSIS — G5603 Carpal tunnel syndrome, bilateral upper limbs: Secondary | ICD-10-CM

## 2016-07-30 MED ORDER — TRAZODONE HCL 50 MG PO TABS: 25.0000 mg | ORAL_TABLET | Freq: Every evening | ORAL | 3 refills | Status: DC | PRN

## 2016-07-30 MED ORDER — GABAPENTIN 300 MG PO CAPS: 300.0000 mg | ORAL_CAPSULE | Freq: Three times a day (TID) | ORAL | 3 refills | Status: DC

## 2016-07-30 MED FILL — GABAPENTIN 300 MG CAPSULE: 300 | 30 days supply | Qty: 90 | Fill #0

## 2016-07-30 MED FILL — traZODone HCL 50 MG TABS: 50 | 30 days supply | Qty: 30 | Fill #0

## 2016-07-30 NOTE — Assessment & Plan Note (Signed)
New to provider, ongoing for pt since his prolonged hospital stay.  Increase gabapentin to 300mg  TID prn as this is contributing to his insomnia.  Offered referral for complete evaluation but he declines at this time.  Will follow.

## 2016-07-30 NOTE — Progress Notes (Signed)
Pre visit review using our clinic review tool, if applicable. No additional management support is needed unless otherwise documented below in the visit note. 

## 2016-07-30 NOTE — Progress Notes (Signed)
   Subjective:    Patient ID: Travis [name], male    DOB: 03/21/1955, 61 y.o.   MRN: [id number]  HPI Insomnia- difficulty falling asleep.  Pt reports some of the difficulty is due to chronic pain in knees and back.  Pt reports he has been taking pain pills to sleep at night.  Pt is on gabapentin 100mg  1-2 tabs twice daily prn for lumbar radiculopathy.  He reports he has had R leg numbness since his prolonged hospital stay  Carpal tunnel- pt reports he will wake w/ numbness and tingling in hands.  His R hand (index, middle, and ring finger) is numb today in the office.   Review of Systems For ROS see HPI     Objective:   Physical Exam  Constitutional: He is oriented to person, place, and time. He appears well-developed and well-nourished. No distress.  Morbidly obese  HENT:  Head: Normocephalic and atraumatic.  Cardiovascular: Intact distal pulses.   Musculoskeletal:  Ambulating w/ rollator  Neurological: He is alert and oriented to person, place, and time.  Numbness and tingling of fingers 2-4 on R hand  Skin: Skin is warm and dry.  Psychiatric: He has a normal mood and affect. His behavior is normal. Thought content normal.  Vitals reviewed.         Assessment & Plan:

## 2016-07-30 NOTE — Assessment & Plan Note (Signed)
New to provider.  He has been using left over pain pills to help him sleep.  Discussed that this is not appropriate.  Will start Trazodone and encouraged nightly compliance w/ OSA tx.  Pt expressed understanding and is in agreement w/ plan.

## 2016-07-30 NOTE — Assessment & Plan Note (Signed)
New.  Pt having numbness at night but now also having daytime sxs, R>L.  Encouraged him to use nightly wrist splints and will refer to hand specialist.  Pt expressed understanding and is in agreement w/ plan.

## 2016-07-30 NOTE — Patient Instructions (Signed)
Follow up as needed/scheduled Start the Trazodone nightly- start w/ 1/2 tab and increase to 1 tab if needed Increase the Gabapentin to the 300mg  tablets- you can take up to 3x/day for pain (will also help sleep at night) Wear the wrist splints nightly until you see the hand specialist (we'll call you with that appt) Call with any questions or concerns Hang in there!!!

## 2016-08-03 DIAGNOSIS — M180 Bilateral primary osteoarthritis of first carpometacarpal joints: Secondary | ICD-10-CM | POA: Diagnosis not present

## 2016-08-03 DIAGNOSIS — G5603 Carpal tunnel syndrome, bilateral upper limbs: Secondary | ICD-10-CM | POA: Diagnosis not present

## 2016-08-14 MED FILL — ROSUVASTATIN CALCIUM 10 MG: 10 | 90 days supply | Qty: 90 | Fill #3

## 2016-08-30 MED FILL — GABAPENTIN 300 MG CAPS: 300 | 30 days supply | Qty: 90 | Fill #1

## 2016-08-30 MED FILL — METOPROLOL SUCC ER 100 MG T: 100 | 90 days supply | Qty: 90 | Fill #2

## 2016-09-05 DIAGNOSIS — G6289 Other specified polyneuropathies: Secondary | ICD-10-CM | POA: Diagnosis not present

## 2016-09-05 DIAGNOSIS — G5603 Carpal tunnel syndrome, bilateral upper limbs: Secondary | ICD-10-CM | POA: Diagnosis not present

## 2016-09-05 MED FILL — traZODone HCL 50 MG TABS: 50 | 30 days supply | Qty: 30 | Fill #1

## 2016-09-05 MED FILL — JANUVIA 100 MG TABLET: 100 | 90 days supply | Qty: 90 | Fill #1

## 2016-09-24 ENCOUNTER — Encounter: Payer: Self-pay | Admitting: Family Medicine

## 2016-09-24 ENCOUNTER — Ambulatory Visit (INDEPENDENT_AMBULATORY_CARE_PROVIDER_SITE_OTHER): Payer: 59 | Admitting: Family Medicine

## 2016-09-24 DIAGNOSIS — E119 Type 2 diabetes mellitus without complications: Secondary | ICD-10-CM

## 2016-09-24 LAB — BASIC METABOLIC PANEL
BUN: 21 mg/dL (ref 6–23)
CHLORIDE: 105 meq/L (ref 96–112)
CO2: 25 meq/L (ref 19–32)
CREATININE: 0.86 mg/dL (ref 0.40–1.50)
Calcium: 9.3 mg/dL (ref 8.4–10.5)
GFR: 96.31 mL/min (ref >60.00)
GLUCOSE: 125 mg/dL — AB (ref 70–99)
Potassium: 3.9 mEq/L (ref 3.5–5.1)
Sodium: 141 mEq/L (ref 135–145)

## 2016-09-24 LAB — HEMOGLOBIN A1C: HEMOGLOBIN A1C: 5.8 % (ref 4.6–6.5)

## 2016-09-24 NOTE — Progress Notes (Signed)
Pre visit review using our clinic review tool, if applicable. No additional management support is needed unless otherwise documented below in the visit note. 

## 2016-09-24 NOTE — Progress Notes (Signed)
   Subjective:    Patient ID: Travis [name], male    DOB: 23-May-1955, 61 y.o.   MRN: [id number]  HPI DM- chronic problem, on Metformin BID and Januvia daily.  On ACE for renal protection.  UTD on eye exam (due in November), UTD on foot exam.  Home CBGs running 120-140s.  Denies symptomatic lows.  Denies neuropathy of hands/feet.  No CP, SOB, HAs, visual changes.  Morbid obesity- pt is up 17 lbs from last visit.  'i don't know what happened there'.  Review of Systems For ROS see HPI     Objective:   Physical Exam  Constitutional: He is oriented to person, place, and time. He appears well-developed and well-nourished. No distress.  Morbidly obese  HENT:  Head: Normocephalic and atraumatic.  Eyes: Pupils are equal, round, and reactive to light. Conjunctivae and EOM are normal.  Neck: Normal range of motion. Neck supple. No thyromegaly present.  Cardiovascular: Normal rate, regular rhythm, normal heart sounds and intact distal pulses.   No murmur heard. Pulmonary/Chest: Effort normal and breath sounds normal. No respiratory distress.  Abdominal: Soft. Bowel sounds are normal. He exhibits no distension.  Musculoskeletal: He exhibits no edema.  Lymphadenopathy:    He has no cervical adenopathy.  Neurological: He is alert and oriented to person, place, and time. No cranial nerve deficit.  Skin: Skin is warm and dry.  Psychiatric: He has a normal mood and affect. His behavior is normal.  Vitals reviewed.         Assessment & Plan:

## 2016-09-24 NOTE — Patient Instructions (Signed)
Follow up in 3-4 months to recheck diabetes, BP, and cholesterol We'll notify you of your lab results and make any changes if needed Try and make healthy food choices to help lower your weight Exercise as you are able! Call with any questions or concerns Happy Fall!!!

## 2016-09-25 NOTE — Assessment & Plan Note (Signed)
Chronic problem.  UTD on eye exam, on ACE, UTD on foot exam.  CBGs are well controlled by report.  Asymptomatic.  Stressed need for healthy diet and regular exercise.  Check labs.  Adjust meds prn

## 2016-09-25 NOTE — Assessment & Plan Note (Signed)
Deteriorated.  Pt has gained 17 lbs since last visit.  Admits to poor dietary habits recently and does not get regular exercise.  Stressed the need for both.  Will continue to follow closely.

## 2016-10-08 ENCOUNTER — Other Ambulatory Visit: Payer: Self-pay | Admitting: Family Medicine

## 2016-10-08 MED FILL — AMLODIPINE BESYLATE 10 MG T: 10 | 90 days supply | Qty: 90 | Fill #0

## 2016-10-08 MED FILL — GABAPENTIN 300 MG CAPSULE: 300 | 30 days supply | Qty: 90 | Fill #2

## 2016-10-08 MED FILL — FUROSEMIDE 20 MG TABLET: 20 | 15 days supply | Qty: 60 | Fill #2

## 2016-10-15 DIAGNOSIS — R0689 Other abnormalities of breathing: Secondary | ICD-10-CM | POA: Diagnosis not present

## 2016-10-15 DIAGNOSIS — G4733 Obstructive sleep apnea (adult) (pediatric): Secondary | ICD-10-CM | POA: Diagnosis not present

## 2016-10-26 ENCOUNTER — Telehealth: Payer: Self-pay

## 2016-10-26 NOTE — Patient Outreach (Signed)
Called [employer] [facility] member to schedule a follow up appointment, there was no answer, left message to call me back. 

## 2016-10-29 ENCOUNTER — Other Ambulatory Visit: Payer: Self-pay | Admitting: Pharmacist

## 2016-10-29 ENCOUNTER — Encounter: Payer: Self-pay | Admitting: Pharmacist

## 2016-10-29 NOTE — Patient Outreach (Signed)
[facility]) Care Management  Travis [name]   10/29/2016  Travis [name] 10-Feb-1955 [id number]  Subjective: Patient presents today for 6 month diabetes follow-up as part of the employer-sponsored [employer] program.  Current diabetes regimen includes metformin and sitagliptin.  Patient also continues on daily aspirin, amlodipine, furosemide, lisinopril, metoprolol and rosuvastatin.  Patient endorses good compliance to medications. Most recent MD follow-up was 09/24/16.  No med changes or major health changes at this time.   Patient reported dietary habits: Eats 2 meals/day Dinner:eats out >4 times per week. Barbecue   Patient reported exercise habits: no significant habits. Works in Hospital doctor and carries laundry daily  Patient denies hypoglycemic events.   Patient reported self monitored blood glucose frequency: 2-3 Home fasting CBG: 120s   Objective:  Lab Results  Component Value Date   HGBA1C 5.8 09/24/2016   There were no vitals filed for this visit.  Lipid Panel     Component Value Date/Time   CHOL 145 06/13/2016 1420   CHOL 131 02/08/2016 0841   TRIG 189.0 (H) 06/13/2016 1420   HDL 31.20 (L) 06/13/2016 1420   HDL 32 (L) 02/08/2016 0841   CHOLHDL 5 06/13/2016 1420   VLDL 37.8 06/13/2016 1420   LDLCALC 76 06/13/2016 1420   LDLCALC 71 02/08/2016 0841     Last eye exam: summer 2018 Last dental exam: summer 2018   Encounter Medications: Outpatient Encounter Prescriptions as of 10/29/2016  Medication Sig  . amLODipine (NORVASC) 10 MG tablet TAKE 1 TABLET BY MOUTH DAILY.  [name] b complex vitamins tablet Take 1 tablet by mouth daily.  . fenofibrate micronized (LOFIBRA) 200 MG capsule Take 1 capsule (200 mg total) by mouth daily before breakfast.  . furosemide (LASIX) 20 MG tablet Take 1-2 tablets (20-40 mg total) by mouth 2 (two) times daily as needed for edema (EDEMA).  . gabapentin (NEURONTIN) 300 MG capsule Take 1 capsule (300 mg  total) by mouth 3 (three) times daily.  [name] ibuprofen (ADVIL,MOTRIN) 200 MG tablet Take 200 mg by mouth as needed.  [name] lisinopril (PRINIVIL,ZESTRIL) 40 MG tablet Take 1 tablet (40 mg total) by mouth daily.  . Magnesium 250 MG TABS Take 1 tablet by mouth daily.  . metFORMIN (GLUCOPHAGE) 500 MG tablet Take 2 tablets (1,000 mg total) by mouth 2 (two) times daily with a meal.  . metoprolol succinate (TOPROL-XL) 100 MG 24 hr tablet TAKE 1 TABLET BY MOUTH ONCE DAILY WITH OR IMMEDIATELY FOLLOWING A MEAL  . Omega-3 Fatty Acids (SALMON OIL-1000 PO) Take 2 capsules by mouth 2 (two) times daily.  . rosuvastatin (CRESTOR) 10 MG tablet Take 1 tablet (10 mg total) by mouth daily.  . sitaGLIPtin (JANUVIA) 100 MG tablet Take 1 tablet (100 mg total) by mouth daily.  . traZODone (DESYREL) 50 MG tablet Take 0.5-1 tablets (25-50 mg total) by mouth at bedtime as needed for sleep.  . TRUE METRIX BLOOD GLUCOSE TEST test strip   . TRUEPLUS LANCETS 30G MISC   . vitamin C (ASCORBIC ACID) 500 MG tablet Take 500 mg by mouth daily.  [name] zinc gluconate 50 MG tablet Take 50 mg by mouth daily.   No facility-administered encounter medications on file as of 10/29/2016.     Functional Status: In your present state of health, do you have any difficulty performing the following activities: 10/29/2016 09/24/2016  Hearing? Y N  Comment recent impaction in ear, treated by primary care. Plans to follow up -  Vision? N  N  Comment - -  Difficulty concentrating or making decisions? N N  Walking or climbing stairs? Y N  Comment hips/knees -  Dressing or bathing? N N  Doing errands, shopping? N N  Some recent data might be hidden    Fall/Depression Screening: Fall Risk  10/29/2016 09/24/2016 06/13/2016  Falls in the past year? No No No   PHQ 2/9 Scores 10/29/2016 09/24/2016 06/13/2016 04/30/2016 01/26/2016 02/19/2015 09/14/2014  PHQ - 2 Score 2 0 1 0 1 0 0  PHQ- 9 Score 3 0 1 - 1 - -      Assessment: Diabetes:  - A1C 5.8% which is  at goal of less than 7%, limited by poor dietary choices and limited exercise - Weight is down from last visit with me.   Physical Activity-  - not at goal of at least 150 minutes of moderate intensity exercise weekly, limited by work commitments  Nutrition- not at goal, limited by eating out frequently   Plan/Goals for Next Visit: - Diet goal: eat out less and reduce portion sizes - Exercise goal: encouraged walking with wife, resistance-based exercise - Follow-up: 6 months - Detailed goals and action plans are listed below  Next appointment to see me is: 04/29/16   Charlene Brooke, PharmD PGY1 Pharmacy Resident Email: [email or web address]    [facility] CM Care Plan Problem One     Most Recent Value  Care Plan Problem One  BMI>25  Care Plan for Problem One  Active  [facility] Long Term Goal   5 pounds weight loss before your next appointment with me.   [facility] Long Term Goal Start Date  10/29/16  Interventions for Problem One Long Term Goal  reduce portion sizes, increase resistance-based exercise    [facility] CM Care Plan Problem Two     Most Recent Value  Care Plan Problem Two  Health Maintenance  Role Documenting the Problem Two  Clinical Pharmacist  Care Plan for Problem Two  Active  Interventions for Problem Two Long Term Goal   Encouraged patient to call and make an appointment to see the dentist.   [facility] Long Term Goal  Make an appointment to see the dentist.   [facility] Long Term Goal Start Date  02/14/15  [facility] Long Term Goal Met Date  08/29/15    [facility] CM Care Plan Problem Three     Most Recent Value  Care Plan Problem Three  PHQ9 >2  Role Documenting the Problem Three  Clinical Pharmacist  Care Plan for Problem Three  Active  [facility] Long Term Goal   Contact provider to inform of mood issues [Contact provider to inform of mood issues]  [facility] Long Term Goal Start Date  10/29/16  Interventions for Problem Three Long Term Goal  Informing provider of PHQ9 score

## 2016-11-08 MED FILL — traZODone HCL 50 MG TABS: 50 | 30 days supply | Qty: 30 | Fill #2

## 2016-11-08 MED FILL — GABAPENTIN 300 MG CAPSULE: 300 | 30 days supply | Qty: 90 | Fill #3

## 2016-11-08 MED FILL — ROSUVASTATIN CALCIUM 10 MG: 10 | 60 days supply | Qty: 60 | Fill #4

## 2016-11-28 ENCOUNTER — Other Ambulatory Visit: Payer: Self-pay | Admitting: Family Medicine

## 2016-11-28 MED FILL — FENOFIBRATE 200 MG CAPSULE: 200 | 90 days supply | Qty: 90 | Fill #0

## 2016-11-28 MED FILL — METOPROLOL SUCC ER 100 MG T: 100 | 60 days supply | Qty: 60 | Fill #3

## 2016-11-28 MED FILL — JANUVIA 100 MG TABLET: 100 | 90 days supply | Qty: 90 | Fill #0

## 2016-11-28 MED FILL — FUROSEMIDE 20 MG TABS: 20 | 15 days supply | Qty: 60 | Fill #3

## 2016-12-01 LAB — HM DIABETES EYE EXAM

## 2016-12-04 DIAGNOSIS — H524 Presbyopia: Secondary | ICD-10-CM | POA: Diagnosis not present

## 2016-12-27 MED FILL — traZODone HCL 50 MG TABS: 50 | 30 days supply | Qty: 30 | Fill #3

## 2016-12-31 ENCOUNTER — Ambulatory Visit (INDEPENDENT_AMBULATORY_CARE_PROVIDER_SITE_OTHER): Payer: 59 | Admitting: Family Medicine

## 2016-12-31 ENCOUNTER — Encounter: Payer: Self-pay | Admitting: Family Medicine

## 2016-12-31 ENCOUNTER — Other Ambulatory Visit: Payer: Self-pay

## 2016-12-31 VITALS — BP 133/83 | HR 75 | Temp 98.1°F | Resp 16 | Ht 69.0 in | Wt 337.5 lb

## 2016-12-31 DIAGNOSIS — I10 Essential (primary) hypertension: Secondary | ICD-10-CM

## 2016-12-31 DIAGNOSIS — E785 Hyperlipidemia, unspecified: Secondary | ICD-10-CM

## 2016-12-31 DIAGNOSIS — E119 Type 2 diabetes mellitus without complications: Secondary | ICD-10-CM

## 2016-12-31 LAB — CBC WITH DIFFERENTIAL/PLATELET
BASOS ABS: 0 10*3/uL (ref 0.0–0.1)
Basophils Relative: 0.3 % (ref 0.0–3.0)
EOS PCT: 0.1 % (ref 0.0–5.0)
Eosinophils Absolute: 0 10*3/uL (ref 0.0–0.7)
HEMATOCRIT: 39.8 % (ref 39.0–52.0)
Hemoglobin: 13.4 g/dL (ref 13.0–17.0)
LYMPHS ABS: 1.3 10*3/uL (ref 0.7–4.0)
LYMPHS PCT: 13.7 % (ref 12.0–46.0)
MCHC: 33.7 g/dL (ref 30.0–36.0)
MCV: 90.3 fl (ref 78.0–100.0)
MONOS PCT: 6.2 % (ref 3.0–12.0)
Monocytes Absolute: 0.6 10*3/uL (ref 0.1–1.0)
NEUTROS ABS: 7.3 10*3/uL (ref 1.4–7.7)
NEUTROS PCT: 79.7 % — AB (ref 43.0–77.0)
PLATELETS: 139 10*3/uL — AB (ref 150.0–400.0)
RBC: 4.4 Mil/uL (ref 4.22–5.81)
RDW: 14 % (ref 11.5–15.5)
WBC: 9.2 10*3/uL (ref 4.0–10.5)

## 2016-12-31 LAB — LIPID PANEL
CHOL/HDL RATIO: 4
CHOLESTEROL: 123 mg/dL (ref 0–200)
HDL: 28.7 mg/dL — ABNORMAL LOW (ref >39.00)
NonHDL: 94.21
TRIGLYCERIDES: 343 mg/dL — AB (ref 0.0–149.0)
VLDL: 68.6 mg/dL — AB (ref 0.0–40.0)

## 2016-12-31 LAB — BASIC METABOLIC PANEL
BUN: 30 mg/dL — AB (ref 6–23)
CHLORIDE: 105 meq/L (ref 96–112)
CO2: 27 mEq/L (ref 19–32)
CREATININE: 1.16 mg/dL (ref 0.40–1.50)
Calcium: 9.2 mg/dL (ref 8.4–10.5)
GFR: 68.13 mL/min (ref >60.00)
Glucose, Bld: 132 mg/dL — ABNORMAL HIGH (ref 70–99)
Potassium: 3.8 mEq/L (ref 3.5–5.1)
Sodium: 140 mEq/L (ref 135–145)

## 2016-12-31 LAB — LDL CHOLESTEROL, DIRECT: Direct LDL: 77 mg/dL

## 2016-12-31 LAB — HEPATIC FUNCTION PANEL
ALK PHOS: 56 U/L (ref 39–117)
ALT: 25 U/L (ref 0–53)
AST: 27 U/L (ref 0–37)
Albumin: 4.1 g/dL (ref 3.5–5.2)
BILIRUBIN DIRECT: 0.2 mg/dL (ref 0.0–0.3)
Total Bilirubin: 0.5 mg/dL (ref 0.2–1.2)
Total Protein: 6.8 g/dL (ref 6.0–8.3)

## 2016-12-31 LAB — TSH: TSH: 1.45 u[IU]/mL (ref 0.35–4.50)

## 2016-12-31 LAB — HEMOGLOBIN A1C: HEMOGLOBIN A1C: 6 % (ref 4.6–6.5)

## 2016-12-31 NOTE — Assessment & Plan Note (Signed)
Chronic problem.  Adequate control today.  Asymptomatic.  Check labs.  No anticipated med changes. 

## 2016-12-31 NOTE — Progress Notes (Signed)
   Subjective:    Patient ID: Travis [name], male    DOB: [birth year], 61 y.o.   MRN: [id number]  HPI HTN- chronic problem, on Amlodipine 10mg  daily, Lasix 20mg  prn, Lisinopril 40mg  daily, Metoprolol XL 100mg  daily w/ adequate control.  No CP, SOB, HAs, visual changes  Hyperlipidemia- chronic problem, on Crestor 10mg  daily and Fenofibrate 200mg  daily.  No abd pain, N/V.  DM- chronic problem, on Januvia 100mg  daily, Metformin 1000mg  BID.  On ACE for renal protection.  UTD on foot exam.  UTD on eye exam.  Denies symptomatic lows.  Denies numbness/tingling of hands/feet.  Morbid obesity- pt has lost another 5 lbs.  Pt is now more interested in his diet.  Review of Systems For ROS see HPI     Objective:   Physical Exam  Constitutional: He is oriented to person, place, and time. He appears well-developed and well-nourished. No distress.  Morbidly obese  HENT:  Head: Normocephalic and atraumatic.  Eyes: Conjunctivae and EOM are normal. Pupils are equal, round, and reactive to light.  Neck: Normal range of motion. Neck supple. No thyromegaly present.  Cardiovascular: Normal rate, regular rhythm, normal heart sounds and intact distal pulses.  No murmur heard. Pulmonary/Chest: Effort normal and breath sounds normal. No respiratory distress.  Abdominal: Soft. Bowel sounds are normal. He exhibits no distension.  Musculoskeletal: He exhibits no edema.  Lymphadenopathy:    He has no cervical adenopathy.  Neurological: He is alert and oriented to person, place, and time. No cranial nerve deficit.  Skin: Skin is warm and dry.  Psychiatric: He has a normal mood and affect. His behavior is normal.  Vitals reviewed.         Assessment & Plan:

## 2016-12-31 NOTE — Patient Instructions (Signed)
Follow up in 3-4 months to recheck diabetes We'll notify you of your lab results and make any changes if needed Continue to work on healthy diet and exercise as able- you can do it! Call with any questions or concerns Happy Holidays!!!

## 2016-12-31 NOTE — Assessment & Plan Note (Signed)
Chronic problem.  On Januvia and Metformin.  UTD on eye exam, foot exam.  On ACE for renal protection.  Currently asymptomatic.  Check labs.  Adjust meds prn

## 2016-12-31 NOTE — Assessment & Plan Note (Signed)
Chronic problem.  Tolerating statin and fenofibrate w/o difficulty.  Stressed need for healthy diet and regular exercise.  Check labs.  Adjust meds prn

## 2016-12-31 NOTE — Assessment & Plan Note (Signed)
Pt has lost another 5 lbs!  Applauded his efforts on healthy diet.  Stressed need to add regular exercise.  Check labs to risk stratify.  Will follow.

## 2017-01-01 ENCOUNTER — Encounter: Payer: Self-pay | Admitting: General Practice

## 2017-02-01 ENCOUNTER — Other Ambulatory Visit: Payer: Self-pay | Admitting: Cardiology

## 2017-02-01 ENCOUNTER — Other Ambulatory Visit: Payer: Self-pay | Admitting: Family Medicine

## 2017-02-01 ENCOUNTER — Other Ambulatory Visit: Payer: Self-pay | Admitting: Internal Medicine

## 2017-02-01 MED FILL — AMLODIPINE BESYLATE 10 MG T: 10 | 90 days supply | Qty: 90 | Fill #1

## 2017-02-01 MED FILL — LISINOPRIL 40 MG TABLET: 40 | 90 days supply | Qty: 90 | Fill #0

## 2017-02-01 MED FILL — GABAPENTIN 300 MG CAPSULE: 300 | 30 days supply | Qty: 90 | Fill #0

## 2017-02-01 MED FILL — metFORMIN HCL 500 MG TABS: 500 | 90 days supply | Qty: 360 | Fill #0

## 2017-02-01 MED FILL — FUROSEMIDE 20 MG TABS: 20 | 15 days supply | Qty: 60 | Fill #4

## 2017-02-01 MED FILL — traZODone HCL 50 MG TABS: 50 | 30 days supply | Qty: 30 | Fill #0

## 2017-02-01 MED FILL — METOPROLOL SUCC ER 100 MG T: 100 | 30 days supply | Qty: 30 | Fill #0

## 2017-02-01 MED FILL — ROSUVASTATIN CALCIUM 10 MG: 10 | 30 days supply | Qty: 30 | Fill #0

## 2017-03-07 MED FILL — JANUVIA 100 MG TABLET: 100 | 90 days supply | Qty: 90 | Fill #1

## 2017-03-07 MED FILL — GABAPENTIN 300 MG CAPSULE: 300 | 30 days supply | Qty: 90 | Fill #1

## 2017-03-11 ENCOUNTER — Encounter: Payer: Self-pay | Admitting: Cardiology

## 2017-03-11 ENCOUNTER — Ambulatory Visit: Payer: 59 | Admitting: Cardiology

## 2017-03-11 VITALS — BP 136/78 | HR 66 | Ht 69.0 in | Wt 341.1 lb

## 2017-03-11 DIAGNOSIS — I2510 Atherosclerotic heart disease of native coronary artery without angina pectoris: Secondary | ICD-10-CM | POA: Diagnosis not present

## 2017-03-11 MED ORDER — ROSUVASTATIN CALCIUM 10 MG PO TABS: 10.0000 mg | ORAL_TABLET | Freq: Every day | ORAL | 3 refills | Status: DC

## 2017-03-11 MED ORDER — METOPROLOL SUCCINATE ER 100 MG PO TB24: 100.0000 mg | ORAL_TABLET | Freq: Every day | ORAL | 3 refills | Status: DC

## 2017-03-11 MED FILL — ROSUVASTATIN CALCIUM 10 MG: 10 | 90 days supply | Qty: 90 | Fill #0

## 2017-03-11 MED FILL — METOPROLOL SUCCINATE ER 100: 100 | 90 days supply | Qty: 90 | Fill #0

## 2017-03-11 NOTE — Progress Notes (Signed)
03/11/2017 Travis [name]   Apr 08, 1955  [id number]  Primary Physician [name], MD Primary Cardiologist: Dr. [name]   Reason for Visit/CC: routine F/u for CAD, HTN, DLD and OSA  HPI:  62 y/o morbidly obese male, followed by Dr. [name], who presents to clinic today for routine f/u. He has a history of ASCAD with MI in 1997 with PCI by Dr. Glade Lloyd, HTN, dyslipidemia and PVC's. He also had a panniculectomy and debridement of abscess in 2017. He had PAF while in the hospital at [facility] in September following that surgery. Given afib occurred in the setting of surgery/ infection and resolved quickly, he did not required long term anticoagulation. 2D echo during that time showed normal LVF with mild LVH and moderate pulmonary HTN with PASP 21mmHg.  He has done well since w/o any known occurrence.   He is here today for his yearly f/u. He has no cardiac complaints. He denies CP, dyspnea, palpitations, dizziness, syncope/ near syncope. He has chronic bilateral LEE at baseline but not any worse than usual. He reports full med compliance. He is fully compliant with CPAP nightly. He acknowledges that he needs to make dietary changes for weight loss and management for lipid control. He has made some changes in the way he prepares his food. He is also requesting patient information regarding heart healthy/ low fat diet tips. His PCP recently checked labs in Dec. LDL was 77 mg/dL. TG were elevated in the 300s. He is on Crestor 10 mg and also takes fenofibrate. He is intolerant to higher doses of statins. Unable to tolerate high dose Crestor and Lipitor. He also has tried Zetia but had some intolerances/ thrombocytopenia, which was felt to contribute.   Current Meds  Medication Sig  . amLODipine (NORVASC) 10 MG tablet TAKE 1 TABLET BY MOUTH DAILY.  [name] aspirin EC 81 MG tablet Take 81 mg by mouth daily.  [name] b complex vitamins tablet Take 1 tablet by mouth daily.  . fenofibrate micronized  (LOFIBRA) 200 MG capsule TAKE 1 CAPSULE BY MOUTH DAILY BEFORE BREAKFAST  . furosemide (LASIX) 20 MG tablet Take 1-2 tablets (20-40 mg total) by mouth 2 (two) times daily as needed for edema (EDEMA).  . gabapentin (NEURONTIN) 300 MG capsule TAKE 1 CAPSULE BY MOUTH 3 TIMES DAILY.  [name] ibuprofen (ADVIL,MOTRIN) 200 MG tablet Take 200 mg by mouth as needed.  [name] JANUVIA 100 MG tablet TAKE 1 TABLET (100 MG TOTAL) BY MOUTH DAILY.  [name] lisinopril (PRINIVIL,ZESTRIL) 40 MG tablet TAKE 1 TABLET BY MOUTH DAILY.  . Magnesium 250 MG TABS Take 1 tablet by mouth daily.  . metFORMIN (GLUCOPHAGE) 500 MG tablet TAKE 2 TABLETS BY MOUTH 2 TIMES DAILY WITH A MEAL.  . metoprolol succinate (TOPROL-XL) 100 MG 24 hr tablet Take 1 tablet (100 mg total) by mouth daily. With or immediately following a meal. Please make yearly appt with Dr. [name]. 1st attempt  . Omega-3 Fatty Acids (SALMON OIL-1000 PO) Take 2 capsules by mouth 2 (two) times daily.  . rosuvastatin (CRESTOR) 10 MG tablet TAKE 1 TABLET BY MOUTH DAILY.  . traZODone (DESYREL) 50 MG tablet TAKE 1/2 TO 1 TABLET BY MOUTH AT BEDTIME AS NEEDED FOR SLEEP  . TRUE METRIX BLOOD GLUCOSE TEST test strip   . TRUEPLUS LANCETS 30G MISC   . vitamin C (ASCORBIC ACID) 500 MG tablet Take 500 mg by mouth daily.  [name] zinc gluconate 50 MG tablet Take 50 mg by mouth daily.  Allergies  Allergen Reactions  . Crestor [Rosuvastatin]     Muscle Pain   . Lipitor [Atorvastatin] Other (See Comments)    Arm weakness    Past Medical History:  Diagnosis Date  . Appendicitis   . Blood in stool   . Coronary artery disease 1997   s/p PCI by Dr. Glade Lloyd  . Diverticulosis of colon with hemorrhage 2009  . Dyslipidemia, goal LDL below 70   . Edema   . Encounter for long-term (current) use of other medications   . Essential hypertension, benign   . Family history of thyroid disease   . Frequent unifocal PVCs   . Morbid obesity ([place])   . Myalgia and myositis, unspecified   . Prostate  cancer ([place]) 2012  . Sleep apnea with use of continuous positive airway pressure (CPAP)   . Thrombocytopenia ([place]) 05/25/2014  . Type II or unspecified type diabetes mellitus without mention of complication, not stated as uncontrolled    Family History  Problem Relation Age of Onset  . CVA Father   . Hypertension Father   . Diabetes Mother        DM  . Hypothyroidism Sister   . Hypertension Sister    Past Surgical History:  Procedure Laterality Date  . ANGIOPLASTY  1997   Dr. Glade Lloyd  . APPENDECTOMY     Social History   Socioeconomic History  . Marital status: Married    Spouse name: Not on file  . Number of children: Not on file  . Years of education: Not on file  . Highest education level: Not on file  Social Needs  . Financial resource strain: Not on file  . Food insecurity - worry: Not on file  . Food insecurity - inability: Not on file  . Transportation needs - medical: Not on file  . Transportation needs - non-medical: Not on file  Occupational History  . Occupation: Scientific laboratory technician: [employer]    Comment: mod/heavy lifting  Tobacco Use  . Smoking status: Never Smoker  . Smokeless tobacco: Never Used  Substance and Sexual Activity  . Alcohol use: No    Alcohol/week: 0.0 oz  . Drug use: No  . Sexual activity: Not on file  Other Topics Concern  . Not on file  Social History Narrative  . Not on file     Review of Systems: General: negative for chills, fever, night sweats or weight changes.  Cardiovascular: negative for chest pain, dyspnea on exertion, edema, orthopnea, palpitations, paroxysmal nocturnal dyspnea or shortness of breath Dermatological: negative for rash Respiratory: negative for cough or wheezing Urologic: negative for hematuria Abdominal: negative for nausea, vomiting, diarrhea, bright red blood per rectum, melena, or hematemesis Neurologic: negative for visual changes, syncope, or dizziness All other systems reviewed and are  otherwise negative except as noted above.   Physical Exam:  Blood pressure 136/78, pulse 66, height 5\' 9"  (1.753 m), weight (!) 341 lb 1.9 oz (154.7 kg), SpO2 97 %.  General appearance: alert, cooperative, no distress and morbidly obese Neck: no carotid bruit and no JVD Lungs: clear to auscultation bilaterally Heart: regular rate and rhythm, S1, S2 normal, no murmur, click, rub or gallop Extremities: extremities normal, atraumatic, no cyanosis or edema Pulses: 2+ and symmetric Skin: Skin color, texture, turgor normal. No rashes or lesions Neurologic: Grossly normal  EKG NSR 65 bpm -- personally reviewed   ASSESSMENT AND PLAN:   1. CAD: stable w/o angina. No CP or dyspnea. EKg  nonischemic. Continue medical management for secondary prevention. ASA, Statin and BB.   2. H/o PAF: occurred during surgery and infection as outlined above. No documented recurrence. No indication for [place] at this time. No symptoms. Continue ASA and BB therapy.  4. H/o PVCs: he denies any palpitations. HR controlled with BB.    5. OSA: fully compliant with CPAP nightly.  6. HLD:  PCP recently checked lipids in Dec. LDL was 77 mg/dL. TG were elevated in the 300s. He is on Crestor 10 mg and also takes fenofibrate. He is intolerant to higher doses of statins. Unable to tolerate high dose Crestor and Lipitor. He also has tried Zetia but had some intolerances/ thrombocytopenia, which was felt to contribute. He acknowledges that he needs to make dietary changes for weight loss and management for lipid control. He has made some changes in the way he prepares his food. He is also requesting patient information regarding heart healthy/ low fat diet tips. He was provided with this information.   7. Obesity: Body mass index is 50.37 kg/m. We discussed dietary changes. See above.   8. HTN: controlled on current regimen. PCP follows labs. Recent BMP showed stable renal function and lytes.  9. DM: management by PCP.      Follow-Up w/ Dr. [name] in 1 year.   Jerusalem Wert Ladoris Gene, MHS [facility] 03/11/2017 10:20 AM

## 2017-03-11 NOTE — Patient Instructions (Addendum)
Medication Instructions:  Your physician recommends that you continue on your current medications as directed. Please refer to the Current Medication list given to you today.  A REFILL WAS SENT IN FOR METOPROLOL  Labwork: NONE ORDERED TODAY  Testing/Procedures: NONE ORDERED TODAY  Follow-Up: Your physician wants you to follow-up in: [place].  You will receive a reminder letter in the mail two months in advance. If you don't receive a letter, please call our office to schedule the follow-up appointment.   Any Other Special Instructions Will Be Listed Below (If Applicable).  If you need a refill on your cardiac medications before your next appointment, please call your pharmacy.   Fat and Cholesterol Restricted Diet High levels of fat and cholesterol in your blood may lead to various health problems, such as diseases of the heart, blood vessels, gallbladder, liver, and pancreas. Fats are concentrated sources of energy that come in various forms. Certain types of fat, including saturated fat, may be harmful in excess. Cholesterol is a substance needed by your body in small amounts. Your body makes all the cholesterol it needs. Excess cholesterol comes from the food you eat. When you have high levels of cholesterol and saturated fat in your blood, health problems can develop because the excess fat and cholesterol will gather along the walls of your blood vessels, causing them to narrow. Choosing the right foods will help you control your intake of fat and cholesterol. This will help keep the levels of these substances in your blood within normal limits and reduce your risk of disease. What is my plan? Your health care provider recommends that you:  Limit your fat intake to ______% or less of your total calories per day.  Limit the amount of cholesterol in your diet to less than _________mg per day.  Eat 20-30 grams of fiber each day.  What types of fat should I  choose?  Choose healthy fats more often. Choose monounsaturated and polyunsaturated fats, such as olive and canola oil, flaxseeds, walnuts, almonds, and seeds.  Eat more omega-3 fats. Good choices include salmon, mackerel, sardines, tuna, flaxseed oil, and ground flaxseeds. Aim to eat fish at least two times a week.  Limit saturated fats. Saturated fats are primarily found in animal products, such as meats, butter, and cream. Plant sources of saturated fats include palm oil, palm kernel oil, and coconut oil.  Avoid foods with partially hydrogenated oils in them. These contain trans fats. Examples of foods that contain trans fats are stick margarine, some tub margarines, cookies, crackers, and other baked goods. What general guidelines do I need to follow? These guidelines for healthy eating will help you control your intake of fat and cholesterol:  Check food labels carefully to identify foods with trans fats or high amounts of saturated fat.  Fill one half of your plate with vegetables and green salads.  Fill one fourth of your plate with whole grains. Look for the word "whole" as the first word in the ingredient list.  Fill one fourth of your plate with lean protein foods.  Limit fruit to two servings a day. Choose fruit instead of juice.  Eat more foods that contain fiber, such as apples, broccoli, carrots, beans, peas, and barley.  Eat more home-cooked food and less restaurant, buffet, and fast food.  Limit or avoid alcohol.  Limit foods high in starch and sugar.  Limit fried foods.  Cook foods using methods other than frying. Baking, boiling, grilling,  and broiling are all great options.  Lose weight if you are overweight. Losing just 5-10% of your initial body weight can help your overall health and prevent diseases such as diabetes and heart disease.  What foods can I eat? Grains  Whole grains, such as whole wheat or whole grain breads, crackers, cereals, and pasta.  Unsweetened oatmeal, bulgur, barley, quinoa, or brown rice. Corn or whole wheat flour tortillas. Vegetables  Fresh or frozen vegetables (raw, steamed, roasted, or grilled). Green salads. Fruits  All fresh, canned (in natural juice), or frozen fruits. Meats and other protein foods  Ground beef (85% or leaner), grass-fed beef, or beef trimmed of fat. Skinless chicken or [place]. Ground chicken or [place]. Pork trimmed of fat. All fish and seafood. Eggs. Dried beans, peas, or lentils. Unsalted nuts or seeds. Unsalted canned or dry beans. Dairy  Low-fat dairy products, such as skim or 1% milk, 2% or reduced-fat cheeses, low-fat ricotta or cottage cheese, or plain low-fat yo Fats and oils  Tub margarines without trans fats. Light or reduced-fat mayonnaise and salad dressings. Avocado. Olive, canola, sesame, or safflower oils. Natural peanut or almond butter (choose ones without added sugar and oil). The items listed above may not be a complete list of recommended foods or beverages. Contact your dietitian for more options. Foods to avoid Grains  White bread. White pasta. White rice. Cornbread. Bagels, pastries, and croissants. Crackers that contain trans fat. Vegetables  White potatoes. Corn. Creamed or fried vegetables. Vegetables in a cheese sauce. Fruits  Dried fruits. Canned fruit in light or heavy syrup. Fruit juice. Meats and other protein foods  Fatty cuts of meat. Ribs, chicken wings, bacon, sausage, bologna, salami, chitterlings, fatback, hot dogs, bratwurst, and packaged luncheon meats. Liver and organ meats. Dairy  Whole or 2% milk, cream, half-and-half, and cream cheese. Whole milk cheeses. Whole-fat or sweetened yogurt. Full-fat cheeses. Nondairy creamers and whipped toppings. Processed cheese, cheese spreads, or cheese curds. Beverages  Alcohol. Sweetened drinks (such as sodas, lemonade, and fruit drinks or punches). Fats and oils  Butter, stick margarine, lard,  shortening, ghee, or bacon fat. Coconut, palm kernel, or palm oils. Sweets and desserts  Corn syrup, sugars, honey, and molasses. Candy. Jam and jelly. Syrup. Sweetened cereals. Cookies, pies, cakes, donuts, muffins, and ice cream. The items listed above may not be a complete list of foods and beverages to avoid. Contact your dietitian for more information. This information is not intended to replace advice given to you by your health care provider. Make sure you discuss any questions you have with your health care provider. Document Released: 01/01/2005 Document Revised: 01/22/2014 Document Reviewed: 04/01/2013 Elsevier Interactive Patient Education  2018 [place] [facility]-healthy meal planning includes:  Limiting unhealthy fats.  Increasing healthy fats.  Making other small dietary changes.  You may need to talk with your doctor or a diet specialist (dietitian) to create an eating plan that is right for you. What types of fat should I choose?  Choose healthy fats. These include olive oil and canola oil, flaxseeds, walnuts, almonds, and seeds.  Eat more omega-3 fats. These include salmon, mackerel, sardines, tuna, flaxseed oil, and ground flaxseeds. Try to eat fish at least twice each week.  Limit saturated fats. ? Saturated fats are often found in animal products, such as meats, butter, and cream. ? Plant sources of saturated fats include palm oil, palm kernel oil, and coconut oil.  Avoid foods with partially hydrogenated oils in  them. These include stick margarine, some tub margarines, cookies, crackers, and other baked goods. These contain trans fats. What general guidelines do I need to follow?  Check food labels carefully. Identify foods with trans fats or high amounts of saturated fat.  Fill one half of your plate with vegetables and green salads. Eat 4-5 servings of vegetables per day. A serving of vegetables is: ? 1 cup of raw leafy  vegetables. ?  cup of raw or cooked cut-up vegetables. ?  cup of vegetable juice.  Fill one fourth of your plate with whole grains. Look for the word "whole" as the first word in the ingredient list.  Fill one fourth of your plate with lean protein foods.  Eat 4-5 servings of fruit per day. A serving of fruit is: ? One medium whole fruit. ?  cup of dried fruit. ?  cup of fresh, frozen, or canned fruit. ?  cup of 100% fruit juice.  Eat more foods that contain soluble fiber. These include apples, broccoli, carrots, beans, peas, and barley. Try to get 20-30 g of fiber per day.  Eat more home-cooked food. Eat less restaurant, buffet, and fast food.  Limit or avoid alcohol.  Limit foods high in starch and sugar.  Avoid fried foods.  Avoid frying your food. Try baking, boiling, grilling, or broiling it instead. You can also reduce fat by: ? Removing the skin from poultry. ? Removing all visible fats from meats. ? Skimming the fat off of stews, soups, and gravies before serving them. ? Steaming vegetables in water or broth.  Lose weight if you are overweight.  Eat 4-5 servings of nuts, legumes, and seeds per week: ? One serving of dried beans or legumes equals  cup after being cooked. ? One serving of nuts equals 1 ounces. ? One serving of seeds equals  ounce or one tablespoon.  You may need to keep track of how much salt or sodium you eat. This is especially true if you have high blood pressure. Talk with your doctor or dietitian to get more information. What foods can I eat? Grains Breads, including [place], white, pita, wheat, raisin, rye, oatmeal, and [place]. Tortillas that are neither fried nor made with lard or trans fat. Low-fat rolls, including hotdog and hamburger buns and English muffins. Biscuits. Muffins. Waffles. Pancakes. Light popcorn. Whole-grain cereals. Flatbread. Melba toast. Pretzels. Breadsticks. Rusks. Low-fat snacks. Low-fat crackers, including oyster,  saltine, matzo, graham, animal, and rye. Rice and pasta, including brown rice and pastas that are made with whole wheat. Vegetables All vegetables. Fruits All fruits, but limit coconut. Meats and Other Protein Sources Lean, well-trimmed beef, veal, pork, and lamb. Chicken and [place] without skin. All fish and shellfish. Wild duck, rabbit, pheasant, and venison. Egg whites or low-cholesterol egg substitutes. Dried beans, peas, lentils, and tofu. Seeds and most nuts. Dairy Low-fat or nonfat cheeses, including ricotta, string, and mozzarella. Skim or 1% milk that is liquid, powdered, or evaporated. Buttermilk that is made with low-fat milk. Nonfat or low-fat yogurt. Beverages Mineral water. Diet carbonated beverages. Sweets and Desserts Sherbets and fruit ices. Honey, jam, marmalade, jelly, and syrups. Meringues and gelatins. Pure sugar candy, such as hard candy, jelly beans, gumdrops, mints, marshmallows, and small amounts of dark chocolate. [employer] Eat all sweets and desserts in moderation. Fats and Oils Nonhydrogenated (trans-free) margarines. Vegetable oils, including soybean, sesame, sunflower, olive, peanut, safflower, corn, canola, and cottonseed. Salad dressings or mayonnaise made with a vegetable oil. Limit added fats  and oils that you use for cooking, baking, salads, and as spreads. Other Cocoa powder. Coffee and tea. All seasonings and condiments. The items listed above may not be a complete list of recommended foods or beverages. Contact your dietitian for more options. What foods are not recommended? Grains Breads that are made with saturated or trans fats, oils, or whole milk. Croissants. Butter rolls. Cheese breads. Sweet rolls. Donuts. Buttered popcorn. Chow mein noodles. High-fat crackers, such as cheese or butter crackers. Meats and Other Protein Sources Fatty meats, such as hotdogs, short ribs, sausage, spareribs, bacon, rib eye roast or steak, and mutton. High-fat  deli meats, such as salami and bologna. Caviar. Domestic duck and goose. Organ meats, such as kidney, liver, sweetbreads, and heart. Dairy Cream, sour cream, cream cheese, and creamed cottage cheese. Whole-milk cheeses, including blue (bleu), Monte[place] Jack, [place], [place], [place], [place], Swiss, cheddar, [place], and [place]. Whole or 2% milk that is liquid, evaporated, or condensed. Whole buttermilk. Cream sauce or high-fat cheese sauce. Yogurt that is made from whole milk. Beverages Regular sodas and juice drinks with added sugar. Sweets and Desserts Frosting. Pudding. Cookies. Cakes other than angel food cake. Candy that has milk chocolate or white chocolate, hydrogenated fat, butter, coconut, or unknown ingredients. Buttered syrups. Full-fat ice cream or ice cream drinks. Fats and Oils Gravy that has suet, meat fat, or shortening. Cocoa butter, hydrogenated oils, palm oil, coconut oil, palm kernel oil. These can often be found in baked products, candy, fried foods, nondairy creamers, and whipped toppings. Solid fats and shortenings, including bacon fat, salt pork, lard, and butter. Nondairy cream substitutes, such as coffee creamers and sour cream substitutes. Salad dressings that are made of unknown oils, cheese, or sour cream. The items listed above may not be a complete list of foods and beverages to avoid. Contact your dietitian for more information. This information is not intended to replace advice given to you by your health care provider. Make sure you discuss any questions you have with your health care provider. Document Released: 07/03/2011 Document Revised: 06/09/2015 Document Reviewed: 06/25/2013 Elsevier Interactive Patient Education  [employer].

## 2017-03-20 ENCOUNTER — Other Ambulatory Visit: Payer: Self-pay | Admitting: Family Medicine

## 2017-03-20 MED FILL — FUROSEMIDE 20 MG TABS: 20 | 15 days supply | Qty: 60 | Fill #0

## 2017-03-20 MED FILL — traZODone HCL 50 MG TABS: 50 | 30 days supply | Qty: 30 | Fill #1

## 2017-03-20 MED FILL — FENOFIBRATE 200 MG CAPSULE: 200 | 90 days supply | Qty: 90 | Fill #1

## 2017-04-12 MED FILL — GABAPENTIN 300 MG CAPSULE: 300 | 30 days supply | Qty: 90 | Fill #2

## 2017-04-12 MED FILL — FUROSEMIDE 20 MG TABS: 20 | 15 days supply | Qty: 60 | Fill #1

## 2017-04-15 ENCOUNTER — Encounter: Payer: Self-pay | Admitting: Family Medicine

## 2017-04-15 ENCOUNTER — Ambulatory Visit: Payer: 59 | Admitting: Family Medicine

## 2017-04-15 ENCOUNTER — Other Ambulatory Visit: Payer: Self-pay

## 2017-04-15 VITALS — BP 128/80 | HR 80 | Temp 97.9°F | Resp 16 | Ht 69.0 in | Wt 342.1 lb

## 2017-04-15 DIAGNOSIS — R062 Wheezing: Secondary | ICD-10-CM | POA: Diagnosis not present

## 2017-04-15 DIAGNOSIS — E119 Type 2 diabetes mellitus without complications: Secondary | ICD-10-CM

## 2017-04-15 LAB — BASIC METABOLIC PANEL
BUN: 25 mg/dL — AB (ref 6–23)
CHLORIDE: 106 meq/L (ref 96–112)
CO2: 27 meq/L (ref 19–32)
CREATININE: 1.08 mg/dL (ref 0.40–1.50)
Calcium: 9.1 mg/dL (ref 8.4–10.5)
GFR: 73.91 mL/min (ref >60.00)
GLUCOSE: 130 mg/dL — AB (ref 70–99)
Potassium: 3.6 mEq/L (ref 3.5–5.1)
Sodium: 141 mEq/L (ref 135–145)

## 2017-04-15 LAB — HEMOGLOBIN A1C: Hgb A1c MFr Bld: 6.2 % (ref 4.6–6.5)

## 2017-04-15 MED ORDER — ALBUTEROL SULFATE (2.5 MG/3ML) 0.083% IN NEBU
2.5000 mg | INHALATION_SOLUTION | Freq: Once | RESPIRATORY_TRACT | Status: AC
  Administered 2017-04-15: 2.5 mg via RESPIRATORY_TRACT

## 2017-04-15 MED ORDER — AZITHROMYCIN 250 MG PO TABS: ORAL_TABLET | ORAL | 0 refills | Status: DC

## 2017-04-15 MED ORDER — ALBUTEROL SULFATE HFA 108 (90 BASE) MCG/ACT IN AERS: 2.0000 | INHALATION_SPRAY | Freq: Four times a day (QID) | RESPIRATORY_TRACT | 0 refills | Status: DC | PRN

## 2017-04-15 MED ORDER — PROMETHAZINE-DM 6.25-15 MG/5ML PO SYRP: 5.0000 mL | ORAL_SOLUTION | Freq: Four times a day (QID) | ORAL | 0 refills | Status: DC | PRN

## 2017-04-15 MED FILL — AZITHROMYCIN 250 MG TABLET: 250 | 5 days supply | Qty: 6 | Fill #0

## 2017-04-15 MED FILL — PROMETHAZINE-DM SYRUP: 6.25-15 | 12 days supply | Qty: 240 | Fill #0

## 2017-04-15 MED FILL — VENTOLIN HFA 90 MCG INHALER: 108 (90 BAS | 25 days supply | Qty: 18 | Fill #0

## 2017-04-15 NOTE — Assessment & Plan Note (Signed)
Ongoing issue.  Home CBGs are good.  UTD on eye exam.  Foot exam done today.  On ACE for renal protection.  Stressed need for healthy diet and regular exercise- tolerating Metformin w/o difficulty.  Check labs.  Adjust meds prn

## 2017-04-15 NOTE — Patient Instructions (Addendum)
Schedule your complete physical in 3-4 months We'll notify you of your lab results and make any changes if needed Continue to work on healthy diet and regular exercise- you can do it!!! START the Azithromycin as directed- take w/ food Use the Albuterol inhaler- 2 puffs every 4 hrs as needed for cough or wheezing Use the cough syrup as needed at night- may cause drowsiness START daily Cetirizine (Zyrtec) to improve nasal congestion/drainage Call with any questions or concerns [place]!!

## 2017-04-15 NOTE — Progress Notes (Signed)
   Subjective:    Patient ID: Travis [name], male    DOB: [birth year], 62 y.o.   MRN: [id number]  HPI DM- chronic problem.  On Metformin 1000mg  BID, Januvia 100mg  daily w/ hx of good control.  On ACE for renal protection.  UTD on eye exam, foot exam.  Home CBG this AM 105.  Highest reading recently has been 156.  Denies CP, SOB above baseline, HAs, visual changes, edema.  Denies symptomatic lows.  Denies numbness/tingling of feet.  Obesity- pt's BMi is 50.5.  Pt is not exercising due to lumbar radiculopathy.  Pt is not following a particular diet/weight loss plan.  Wheezing- pt reports he has been wheezing for ~1 week.  No documented fever but he reports feeling 'flushed'.  Cough is productive of yellow/green sputum.  + sick contacts.   Review of Systems For ROS see HPI     Objective:   Physical Exam  Constitutional: He is oriented to person, place, and time. He appears well-developed and well-nourished. No distress.  HENT:  Head: Normocephalic and atraumatic.  Right Ear: Tympanic membrane normal.  Left Ear: Tympanic membrane normal.  Nose: No mucosal edema or rhinorrhea. Right sinus exhibits no maxillary sinus tenderness and no frontal sinus tenderness. Left sinus exhibits no maxillary sinus tenderness and no frontal sinus tenderness.  Mouth/Throat: Mucous membranes are normal. No oropharyngeal exudate, posterior oropharyngeal edema or posterior oropharyngeal erythema.  Eyes: Pupils are equal, round, and reactive to light. Conjunctivae and EOM are normal.  Neck: Normal range of motion. Neck supple.  Cardiovascular: Normal rate, regular rhythm and normal heart sounds.  Pulmonary/Chest: Effort normal. No respiratory distress. He has wheezes (diffuse expiratory wheezes- improved s/p neb tx).  + hacking cough  Abdominal: Soft. Bowel sounds are normal. He exhibits no distension. There is no tenderness. There is no rebound and no guarding.  obese  Lymphadenopathy:    He has no cervical  adenopathy.  Neurological: He is alert and oriented to person, place, and time.  Skin: Skin is warm and dry.  Vitals reviewed.         Assessment & Plan:  Wheezing- new.  Pt has no hx of pulmonary issues.  Reports he has been wheezing w/ productive cough x1 week.  Wheezing improved- but did not resolve w/ neb tx.  Start abx.  Albuterol prn.  Cough meds.  Start daily Zyrtec to improve nasal congestion and drainage.  Reviewed supportive care and red flags that should prompt return.  Pt expressed understanding and is in agreement w/ plan.

## 2017-04-15 NOTE — Assessment & Plan Note (Signed)
Ongoing issue for pt.  Stressed need for healthy diet and regular exercise.  Will continue to follow. 

## 2017-04-16 ENCOUNTER — Encounter: Payer: Self-pay | Admitting: General Practice

## 2017-05-01 ENCOUNTER — Other Ambulatory Visit: Payer: Self-pay | Admitting: Family Medicine

## 2017-05-01 MED FILL — LISINOPRIL 40 MG TABLET: 40 | 90 days supply | Qty: 90 | Fill #1

## 2017-05-01 MED FILL — AMLODIPINE BESYLATE 10 MG T: 10 | 90 days supply | Qty: 90 | Fill #0

## 2017-05-01 MED FILL — traZODone HCL 50 MG TABS: 50 | 30 days supply | Qty: 30 | Fill #2

## 2017-05-16 MED FILL — GABAPENTIN 300 MG CAPSULE: 300 | 30 days supply | Qty: 90 | Fill #3

## 2017-05-16 MED FILL — FUROSEMIDE 20 MG TABS: 20 | 15 days supply | Qty: 60 | Fill #2

## 2017-06-07 ENCOUNTER — Other Ambulatory Visit: Payer: Self-pay | Admitting: Family Medicine

## 2017-06-07 MED FILL — METOPROLOL SUCCINATE ER 100: 100 | 90 days supply | Qty: 90 | Fill #1

## 2017-06-07 MED FILL — JANUVIA 100 MG TABLET: 100 | 90 days supply | Qty: 90 | Fill #0

## 2017-06-07 MED FILL — FUROSEMIDE 20 MG TABS: 20 | 15 days supply | Qty: 60 | Fill #3

## 2017-06-07 MED FILL — traZODone HCL 50 MG TABS: 50 | 30 days supply | Qty: 30 | Fill #3

## 2017-06-07 MED FILL — ROSUVASTATIN CALCIUM 10 MG: 10 | 90 days supply | Qty: 90 | Fill #1

## 2017-06-07 MED FILL — metFORMIN HCL 500 MG TABS: 500 | 90 days supply | Qty: 360 | Fill #1

## 2017-07-04 ENCOUNTER — Other Ambulatory Visit: Payer: Self-pay | Admitting: Family Medicine

## 2017-07-04 MED FILL — FENOFIBRATE 200 MG CAPSULE: 200 | 90 days supply | Qty: 90 | Fill #0

## 2017-07-04 MED FILL — GABAPENTIN 300 MG CAPSULE: 300 | 30 days supply | Qty: 90 | Fill #0

## 2017-07-12 DIAGNOSIS — R0689 Other abnormalities of breathing: Secondary | ICD-10-CM | POA: Diagnosis not present

## 2017-07-12 DIAGNOSIS — G4733 Obstructive sleep apnea (adult) (pediatric): Secondary | ICD-10-CM | POA: Diagnosis not present

## 2017-07-31 ENCOUNTER — Other Ambulatory Visit: Payer: Self-pay | Admitting: Family Medicine

## 2017-07-31 MED FILL — AMLODIPINE BESYLATE 10 MG T: 10 | 90 days supply | Qty: 90 | Fill #1

## 2017-07-31 MED FILL — FUROSEMIDE 20 MG TABS: 20 | 15 days supply | Qty: 60 | Fill #4

## 2017-08-01 MED FILL — LISINOPRIL 40 MG TABLET: 40 | 90 days supply | Qty: 90 | Fill #0

## 2017-08-01 MED FILL — traZODone HCL 50 MG TABS: 50 | 90 days supply | Qty: 90 | Fill #0

## 2017-08-01 MED FILL — GABAPENTIN 300 MG CAPSULE: 300 | 30 days supply | Qty: 90 | Fill #1

## 2017-09-04 ENCOUNTER — Other Ambulatory Visit: Payer: Self-pay

## 2017-09-04 ENCOUNTER — Encounter: Payer: Self-pay | Admitting: Family Medicine

## 2017-09-04 ENCOUNTER — Ambulatory Visit (INDEPENDENT_AMBULATORY_CARE_PROVIDER_SITE_OTHER): Payer: 59 | Admitting: Family Medicine

## 2017-09-04 VITALS — BP 118/74 | HR 69 | Temp 98.6°F | Resp 16 | Ht 68.0 in | Wt 350.4 lb

## 2017-09-04 DIAGNOSIS — Z125 Encounter for screening for malignant neoplasm of prostate: Secondary | ICD-10-CM | POA: Diagnosis not present

## 2017-09-04 DIAGNOSIS — Z0000 Encounter for general adult medical examination without abnormal findings: Secondary | ICD-10-CM

## 2017-09-04 DIAGNOSIS — E119 Type 2 diabetes mellitus without complications: Secondary | ICD-10-CM

## 2017-09-04 NOTE — Assessment & Plan Note (Signed)
Chronic problem.  UTD on foot exam, eye exam, and on ACE for renal protection.  Stressed need for healthy diet and regular exercise.  Check labs.  Adjust meds prn  

## 2017-09-04 NOTE — Progress Notes (Signed)
   Subjective:    Patient ID: Travis [name], male    DOB: [birth year], 62 y.o.   MRN: [id number]  HPI CPE- due for colonoscopy (Dr Oletta Lamas), UTD on immunizations, eye exam.   Review of Systems Patient reports no vision/hearing changes, anorexia, fever ,adenopathy, persistant/recurrent hoarseness, swallowing issues, chest pain, palpitations, edema, persistant/recurrent cough, hemoptysis, dyspnea (rest,exertional, paroxysmal nocturnal), gastrointestinal  bleeding (melena, rectal bleeding), abdominal pain, excessive heart burn, GU symptoms (dysuria, hematuria, voiding/incontinence issues) syncope, focal weakness, memory loss, numbness & tingling, skin/hair/nail changes, depression, anxiety, abnormal bruising/bleeding, musculoskeletal symptoms/signs.     Objective:   Physical Exam General Appearance:    Alert, cooperative, no distress, appears stated age, obese  Head:    Normocephalic, without obvious abnormality, atraumatic  Eyes:    PERRL, conjunctiva/corneas clear, EOM's intact, fundi    benign, both eyes       Ears:    Normal TM's and external ear canals, both ears  Nose:   Nares normal, septum midline, mucosa normal, no drainage   or sinus tenderness  Throat:   Lips, mucosa, and tongue normal; teeth and gums normal  Neck:   Supple, symmetrical, trachea midline, no adenopathy;       thyroid:  No enlargement/tenderness/nodules  Back:     Symmetric, no curvature, ROM normal, no CVA tenderness  Lungs:     Clear to auscultation bilaterally, respirations unlabored  Chest wall:    No tenderness or deformity  Heart:    Regular rate and rhythm, S1 and S2 normal, no murmur, rub   or gallop  Abdomen:     Soft, non-tender, bowel sounds active all four quadrants,    no masses, no organomegaly  Genitalia:    deferred  Rectal:    Extremities:   Extremities normal, atraumatic, no cyanosis or edema  Pulses:   2+ and symmetric all extremities  Skin:   Skin color, texture, turgor normal, no rashes or  lesions  Lymph nodes:   Cervical, supraclavicular, and axillary nodes normal  Neurologic:   CNII-XII intact. Normal strength, sensation and reflexes      throughout          Assessment & Plan:

## 2017-09-04 NOTE — Patient Instructions (Signed)
Follow up in 3-4 months to recheck diabetes We'll notify you of your lab results and make any changes if needed Continue to work on healthy diet and regular exercise- you can do it!!! Call with any questions or concerns Happy Labor Day!!!

## 2017-09-04 NOTE — Assessment & Plan Note (Signed)
Pt's PE WNL w/ exception of obesity.  UTD on Tdap.  Will get flu at work.  Pt is due for colonoscopy but waiting to hear from Dr Oletta Lamas office.  Check labs.  Anticipatory guidance provided.

## 2017-09-05 LAB — HEPATIC FUNCTION PANEL
ALK PHOS: 53 U/L (ref 39–117)
ALT: 23 U/L (ref 0–53)
AST: 25 U/L (ref 0–37)
Albumin: 4.1 g/dL (ref 3.5–5.2)
BILIRUBIN DIRECT: 0.1 mg/dL (ref 0.0–0.3)
BILIRUBIN TOTAL: 0.5 mg/dL (ref 0.2–1.2)
Total Protein: 6.8 g/dL (ref 6.0–8.3)

## 2017-09-05 LAB — CBC WITH DIFFERENTIAL/PLATELET
BASOS PCT: 1.1 % (ref 0.0–3.0)
Basophils Absolute: 0.1 10*3/uL (ref 0.0–0.1)
EOS PCT: 0.1 % (ref 0.0–5.0)
Eosinophils Absolute: 0 10*3/uL (ref 0.0–0.7)
HCT: 39.3 % (ref 39.0–52.0)
Hemoglobin: 13.3 g/dL (ref 13.0–17.0)
LYMPHS ABS: 1.5 10*3/uL (ref 0.7–4.0)
Lymphocytes Relative: 18.4 % (ref 12.0–46.0)
MCHC: 33.8 g/dL (ref 30.0–36.0)
MCV: 89.6 fl (ref 78.0–100.0)
MONO ABS: 0.8 10*3/uL (ref 0.1–1.0)
MONOS PCT: 9.9 % (ref 3.0–12.0)
NEUTROS PCT: 70.5 % (ref 43.0–77.0)
Neutro Abs: 5.8 10*3/uL (ref 1.4–7.7)
Platelets: 139 10*3/uL — ABNORMAL LOW (ref 150.0–400.0)
RBC: 4.39 Mil/uL (ref 4.22–5.81)
RDW: 14.7 % (ref 11.5–15.5)
WBC: 8.3 10*3/uL (ref 4.0–10.5)

## 2017-09-05 LAB — BASIC METABOLIC PANEL
BUN: 23 mg/dL (ref 6–23)
CALCIUM: 9.3 mg/dL (ref 8.4–10.5)
CO2: 29 meq/L (ref 19–32)
CREATININE: 1.15 mg/dL (ref 0.40–1.50)
Chloride: 104 mEq/L (ref 96–112)
GFR: 68.66 mL/min (ref >60.00)
Glucose, Bld: 99 mg/dL (ref 70–99)
Potassium: 3.8 mEq/L (ref 3.5–5.1)
Sodium: 142 mEq/L (ref 135–145)

## 2017-09-05 LAB — TSH: TSH: 2.19 u[IU]/mL (ref 0.35–4.50)

## 2017-09-05 LAB — LIPID PANEL
CHOL/HDL RATIO: 4
Cholesterol: 129 mg/dL (ref 0–200)
HDL: 31.5 mg/dL — AB (ref >39.00)
NONHDL: 97.18
TRIGLYCERIDES: 227 mg/dL — AB (ref 0.0–149.0)
VLDL: 45.4 mg/dL — AB (ref 0.0–40.0)

## 2017-09-05 LAB — LDL CHOLESTEROL, DIRECT: Direct LDL: 79 mg/dL

## 2017-09-05 LAB — HEMOGLOBIN A1C: Hgb A1c MFr Bld: 6.5 % (ref 4.6–6.5)

## 2017-09-05 LAB — PSA: PSA: 0.02 ng/mL — ABNORMAL LOW (ref 0.10–4.00)

## 2017-09-09 ENCOUNTER — Other Ambulatory Visit: Payer: Self-pay | Admitting: Family Medicine

## 2017-09-09 MED FILL — METOPROLOL SUCCINATE ER 100: 100 | 90 days supply | Qty: 90 | Fill #2

## 2017-09-09 MED FILL — FUROSEMIDE 20 MG TABS: 20 | 15 days supply | Qty: 60 | Fill #5

## 2017-09-09 MED FILL — ROSUVASTATIN CALCIUM 10 MG: 10 | 90 days supply | Qty: 90 | Fill #2

## 2017-09-09 MED FILL — JANUVIA 100 MG TABLET: 100 | 90 days supply | Qty: 90 | Fill #1

## 2017-09-09 MED FILL — metFORMIN HCL 500 MG TABS: 500 | 90 days supply | Qty: 360 | Fill #0

## 2017-10-10 ENCOUNTER — Other Ambulatory Visit: Payer: Self-pay

## 2017-10-10 ENCOUNTER — Encounter: Payer: Self-pay | Admitting: Family Medicine

## 2017-10-10 ENCOUNTER — Ambulatory Visit: Payer: 59 | Admitting: Family Medicine

## 2017-10-10 VITALS — BP 110/80 | HR 55 | Temp 98.1°F | Resp 16 | Ht 68.0 in | Wt 346.0 lb

## 2017-10-10 DIAGNOSIS — M25561 Pain in right knee: Secondary | ICD-10-CM

## 2017-10-10 DIAGNOSIS — M25562 Pain in left knee: Secondary | ICD-10-CM | POA: Diagnosis not present

## 2017-10-10 MED ORDER — MELOXICAM 15 MG PO TABS: 15.0000 mg | ORAL_TABLET | Freq: Every day | ORAL | 1 refills | Status: DC

## 2017-10-10 MED FILL — MELOXICAM 15 MG TABLET: 15 | 30 days supply | Qty: 30 | Fill #0

## 2017-10-10 NOTE — Progress Notes (Signed)
   Subjective:    Patient ID: Travis [name], male    DOB: 20-Jun-1955, 62 y.o.   MRN: [id number]  HPI Leg pain- L knee pain for 'the last month or so'.  Pain will radiate up leg to mid thigh and down into the ankle.  R knee pain just above kneecap.  Pain was so severe he was not able to get to the restroom even using his walker.  Severe pain in R knee w/ sitting.  Pt denies redness of either knee.  Some swelling.  No known injury.  Pt does not have orthopedic doctor.  Took 800mg  ibuprofen overnight which allowed him to sleep but once he started moving, pain returned.   Review of Systems For ROS see HPI     Objective:   Physical Exam  Constitutional: He is oriented to person, place, and time. He appears well-developed and well-nourished. No distress.  Morbidly obese  Cardiovascular: Intact distal pulses.  Musculoskeletal: He exhibits no deformity.  Difficult knee exam due to body habitus Mild TTP over L medial joint line TTP over patella bilaterally + patellar grind bilaterally TTP over lateral quads, above and lateral to knee caps  Neurological: He is alert and oriented to person, place, and time.  Skin: Skin is warm and dry. No erythema.  Vitals reviewed.         Assessment & Plan:  Bilateral knee pain- new. Pt's body habitus is likely a factor in his knee pain and given his wide hips and location of his knee pain, I suspect he has some degree of patellofemoral syndrome.  Needs a complete evaluation from ortho or sports med- referral placed.  Start scheduled NSAIDs.  Reviewed supportive care and red flags that should prompt return.  Pt expressed understanding and is in agreement w/ plan.

## 2017-10-10 NOTE — Patient Instructions (Signed)
Follow up as needed or as scheduled We'll call you with your Sports Medicine appt for the knee pain ICE!! Start the once daily Meloxicam- take w/ food- for pain/inflammation Do not add any additional ibuprofen, motrin, advil, aleve, etc You can add Tylenol (Acetaminophen) for breakthrough pain Call with any questions or concerns Hang in there!!

## 2017-10-11 ENCOUNTER — Encounter: Payer: Self-pay | Admitting: Sports Medicine

## 2017-10-11 ENCOUNTER — Ambulatory Visit: Payer: Self-pay

## 2017-10-11 ENCOUNTER — Ambulatory Visit: Payer: 59 | Admitting: Sports Medicine

## 2017-10-11 VITALS — BP 120/74 | HR 70 | Ht 68.0 in | Wt 345.6 lb

## 2017-10-11 DIAGNOSIS — M25562 Pain in left knee: Principal | ICD-10-CM

## 2017-10-11 DIAGNOSIS — M25461 Effusion, right knee: Secondary | ICD-10-CM | POA: Diagnosis not present

## 2017-10-11 DIAGNOSIS — M25561 Pain in right knee: Secondary | ICD-10-CM

## 2017-10-11 DIAGNOSIS — M170 Bilateral primary osteoarthritis of knee: Secondary | ICD-10-CM

## 2017-10-11 DIAGNOSIS — G8929 Other chronic pain: Secondary | ICD-10-CM | POA: Diagnosis not present

## 2017-10-11 NOTE — Progress Notes (Signed)
Travis [name], [place] at [facility] [phone]  NEILS SIRACUSA - 62 y.o. male MRN [id number]  Date of birth: Mar 08, 1955  Visit Date: 10/11/2017  PCP: [name], MD   Referred by: [name], MD   Scribe(s) for today's visit: Josepha Pigg, CMA  SUBJECTIVE:  Travis [name] is here for Initial Assessment (B knee pain)  Referred by: Dr. Aundra Millet. [place]  HPI: His B knee pain symptoms INITIALLY: Began several years ago and has been off and on. Hx of L knee injury as a child. R knee pain started to flare up a few days ago. Typically its one knee or the other but recently he has been having pain in both knees.  Described as mild-moderate aching and burning, radiating to both ankles. He denies swelling around the ankles.  Worsened with pivot, weight bearing.  Improved with rest.  Additional associated symptoms include: He reports a lot of popping in his knee. At times the L knee will give out on him, his knee locked up. R knee pain is mostly proximal and medial. L knee pain is mostly distal. He reports some swelling in both knees. He reports a lot of stiffness in both knees when ambulating after sitting.     At this time symptoms show no change. Sx wax and wain.  He has been taking Meloxicam with minimal relief. He is currently ambulating with a walker. In the past he has tried IBU with minimal relief. He has tried Acetaminophen with minima relief. He has been icing his knees with temporary relief.   He takes Lasix for edema.    REVIEW OF SYSTEMS: Reports night time disturbances. Denies fevers, chills, or night sweats. Denies unexplained weight loss. Reports personal history of cancer, prostate. Denies changes in bowel or bladder habits. Denies recent unreported falls. Denies new or worsening dyspnea or wheezing. Denies headaches or dizziness.  Denies numbness, tingling or weakness  In the extremities - carpal  tunnel and arthritis.  Denies dizziness or presyncopal episodes Denies lower extremity edema    HISTORY:  Prior history reviewed and updated per electronic medical record.  Social History   Occupational History  . Occupation: Scientific laboratory technician: [employer]    Comment: mod/heavy lifting  Tobacco Use  . Smoking status: Never Smoker  . Smokeless tobacco: Never Used  Substance and Sexual Activity  . Alcohol use: No    Alcohol/week: 0.0 standard drinks  . Drug use: No  . Sexual activity: Not on file   Social History   Social History Narrative  . Not on file    Past Medical History:  Diagnosis Date  . Appendicitis   . Blood in stool   . Coronary artery disease 1997   s/p PCI by Dr. Glade Lloyd  . Diverticulosis of colon with hemorrhage 2009  . Dyslipidemia, goal LDL below 70   . Edema   . Encounter for long-term (current) use of other medications   . Essential hypertension, benign   . Family history of thyroid disease   . Frequent unifocal PVCs   . Morbid obesity ([place])   . Myalgia and myositis, unspecified   . Prostate cancer ([place]) 2012  . Sleep apnea with use of continuous positive airway pressure (CPAP)   . Thrombocytopenia ([place]) 05/25/2014  . Type II or unspecified type diabetes mellitus without mention of complication, not stated as uncontrolled    Past Surgical History:  Procedure  Laterality Date  . ANGIOPLASTY  1997   Dr. Glade Lloyd  . APPENDECTOMY     family history includes CVA in his father; Diabetes in his mother; Hypertension in his father and sister; Hypothyroidism in his sister.  DATA OBTAINED & REVIEWED:   Recent Labs    12/31/16 1458 04/15/17 1436 09/04/17 1416  HGBA1C 6.0 6.2 6.5   .   OBJECTIVE:  VS:  HT:5\' 8"  (172.7 cm)   WT:(!) 345 lb 9.6 oz (156.8 kg)  BMI:52.56    BP:120/74  HR:70bpm  TEMP: ( )  RESP:95 %   PHYSICAL EXAM: CONSTITUTIONAL: Well-developed, Well-nourished and In no acute distress PSYCHIATRIC: Alert & appropriately  interactive. and Not depressed or anxious appearing. RESPIRATORY: No increased work of breathing and Trachea Midline EYES: Pupils are equal., EOM intact without nystagmus. and No scleral icterus.  VASCULAR EXAM: Warm and well perfused Edema: 1+ pitting edema pretibial bilaterally NEURO: unremarkable  MSK Exam: Bilateral knee  Well aligned, no significant deformity. No overlying skin changes. generalized soft tissue swelling with generalized soft tissue swelling.   RANGE OF MOTION & STRENGTH  Slightly limited extension by 5 degrees.  Full flexion.   SPECIALITY TESTING:  Moderate effusion.  Ligamentously stable.  Pain with McMurray's bilaterally.  Positive patellar grind.     ASSESSMENT   1. Chronic pain of both knees   2. Effusion of right knee   3. Primary osteoarthritis of both knees     PLAN:  Pertinent additional documentation may be included in corresponding procedure notes, imaging studies, problem based documentation and patient instructions.  Procedures:  . US Guided Injection per procedure note  Medications:  No orders of the defined types were placed in this encounter.  Discussion/Instructions: No problem-specific Assessment & Plan notes found for this encounter.  . Consider repeat injections in 10 weeks. . Discussed red flag symptoms that warrant earlier emergent evaluation and patient voices understanding. . Activity modifications and the importance of avoiding exacerbating activities (limiting pain to no more than a 4 / 10 during or following activity) recommended and discussed.  Follow-up:  . Return in about 10 weeks (around 12/20/2017) for consideration of repeat injections.   . If any lack of improvement consider: further diagnostic evaluation with Plain film x-rays at follow-up.  . At follow up will plan to consider: repeat corticosteroid injections     CMA/ATC served as scribe during this visit. History, Physical, and Plan performed by medical  provider. Documentation and orders reviewed and attested to.      Gerda Diss, [place] [facility] Physician

## 2017-10-11 NOTE — Procedures (Signed)
PROCEDURE NOTE: Bilateral Knee Injections:  ++++++++++++++++++++++++++++++++++++++++++++++++++++++++++  Ultrasound Guided: Injection: Left knee Images were obtained and interpreted by myself, Teresa Coombs, DO  Images have been saved and stored to PACS system. Images obtained on: GE S7 Ultrasound machine    ULTRASOUND FINDINGS:  Moderate effusion with synovitis  DESCRIPTION OF PROCEDURE:  The patient's clinical condition is marked by substantial pain and/or significant functional disability. Other conservative therapy has not provided relief, is contraindicated, or not appropriate. There is a reasonable likelihood that injection will significantly improve the patient's pain and/or functional impairment.   After discussing the risks, benefits and expected outcomes of the injection and all questions were reviewed and answered, the patient wished to undergo the above named procedure.  Verbal consent was obtained.  The ultrasound was used to identify the target structure and adjacent neurovascular structures. The skin was then prepped in sterile fashion and the target structure was injected under direct visualization using sterile technique as below:  Single injection performed as below: PREP: Alcohol and Ethel Chloride APPROACH:superiolateral, single injection, 21g 2 in. INJECTATE: 2 cc 0.5% Marcaine and 1 cc 40mg /mL DepoMedrol ASPIRATE: None DRESSING: Band-Aid  Post procedural instructions including recommending icing and warning signs for infection were reviewed.    This procedure was well tolerated and there were no complications.   IMPRESSION: Succesful Ultrasound Guided: Injection     ++++++++++++++++++++++++++++++++++++++++++++++++++++++++++++  PROCEDURE NOTE:  Ultrasound Guided: Aspiration and Injection: Right knee Images were obtained and interpreted by myself, Teresa Coombs, DO  Images have been saved and stored to PACS system. Images obtained on: GE S7 Ultrasound machine     ULTRASOUND FINDINGS:    DESCRIPTION OF PROCEDURE:  The patient's clinical condition is marked by substantial pain and/or significant functional disability. Other conservative therapy has not provided relief, is contraindicated, or not appropriate. There is a reasonable likelihood that injection will significantly improve the patient's pain and/or functional impairment.   After discussing the risks, benefits and expected outcomes of the injection and all questions were reviewed and answered, the patient wished to undergo the above named procedure.  Verbal consent was obtained.  The ultrasound was used to identify the target structure and adjacent neurovascular structures. The skin was then prepped in sterile fashion and the target structure was injected under direct visualization using sterile technique as below:  Single injection performed as below: PREP: Alcohol, Ethel Chloride and 5 cc 1% lidocaine on 25g 1.5 in. needle APPROACH:superiolateral, stopcock technique, 18g 1.5 in. INJECTATE: 2 cc 0.5% Marcaine and 1 cc 40mg /mL DepoMedrol ASPIRATE: 33mL  and straw colored  DRESSING: Band-Aid  Post procedural instructions including recommending icing and warning signs for infection were reviewed.    This procedure was well tolerated and there were no complications.   IMPRESSION: Succesful Ultrasound Guided: Aspiration and Injection

## 2017-10-11 NOTE — Patient Instructions (Signed)

## 2017-11-07 ENCOUNTER — Other Ambulatory Visit: Payer: Self-pay | Admitting: Family Medicine

## 2017-11-07 MED FILL — GABAPENTIN 300 MG CAPSULE: 300 | 30 days supply | Qty: 90 | Fill #2

## 2017-11-07 MED FILL — FENOFIBRATE 200 MG CAPSULE: 200 | 90 days supply | Qty: 90 | Fill #1

## 2017-11-07 MED FILL — traZODone HCL 50 MG TABS: 50 | 90 days supply | Qty: 90 | Fill #1

## 2017-11-07 MED FILL — MELOXICAM 15 MG TABLET: 15 | 30 days supply | Qty: 30 | Fill #1

## 2017-11-07 MED FILL — AMLODIPINE BESYLATE 10 MG T: 10 | 90 days supply | Qty: 90 | Fill #0

## 2017-11-24 ENCOUNTER — Encounter: Payer: Self-pay | Admitting: Sports Medicine

## 2017-11-24 DIAGNOSIS — M170 Bilateral primary osteoarthritis of knee: Secondary | ICD-10-CM | POA: Insufficient documentation

## 2017-12-05 DIAGNOSIS — H5213 Myopia, bilateral: Secondary | ICD-10-CM | POA: Diagnosis not present

## 2017-12-05 DIAGNOSIS — E119 Type 2 diabetes mellitus without complications: Secondary | ICD-10-CM | POA: Diagnosis not present

## 2017-12-05 DIAGNOSIS — H40053 Ocular hypertension, bilateral: Secondary | ICD-10-CM | POA: Diagnosis not present

## 2017-12-05 LAB — HM DIABETES EYE EXAM

## 2017-12-09 ENCOUNTER — Other Ambulatory Visit: Payer: Self-pay | Admitting: Family Medicine

## 2017-12-09 MED FILL — LISINOPRIL 40 MG TABLET: 40 | 90 days supply | Qty: 90 | Fill #1

## 2017-12-09 MED FILL — JANUVIA 100 MG TABLET: 100 | 90 days supply | Qty: 90 | Fill #0

## 2017-12-09 MED FILL — metFORMIN HCL 500 MG TABS: 500 | 90 days supply | Qty: 360 | Fill #1

## 2017-12-09 MED FILL — MELOXICAM 15 MG TABLET: 15 | 30 days supply | Qty: 30 | Fill #0

## 2017-12-09 MED FILL — METOPROLOL SUCCINATE ER 100: 100 | 90 days supply | Qty: 90 | Fill #3

## 2017-12-09 MED FILL — ROSUVASTATIN CALCIUM 10 MG: 10 | 90 days supply | Qty: 90 | Fill #3

## 2017-12-09 MED FILL — GABAPENTIN 300 MG CAPSULE: 300 | 30 days supply | Qty: 90 | Fill #3

## 2017-12-11 ENCOUNTER — Encounter: Payer: Self-pay | Admitting: General Practice

## 2017-12-20 ENCOUNTER — Ambulatory Visit: Payer: 59 | Admitting: Sports Medicine

## 2017-12-20 ENCOUNTER — Ambulatory Visit: Payer: Self-pay

## 2017-12-20 ENCOUNTER — Encounter: Payer: Self-pay | Admitting: Sports Medicine

## 2017-12-20 VITALS — BP 142/82 | HR 59 | Ht 68.0 in | Wt 344.6 lb

## 2017-12-20 DIAGNOSIS — M170 Bilateral primary osteoarthritis of knee: Secondary | ICD-10-CM | POA: Diagnosis not present

## 2017-12-20 NOTE — Patient Instructions (Addendum)

## 2017-12-20 NOTE — Procedures (Signed)
PROCEDURE NOTE:  Ultrasound Guided: Injection: Bilateral knee Images were obtained and interpreted by myself, Teresa Coombs, DO  Images have been saved and stored to PACS system. Images obtained on: GE S7 Ultrasound machine    ULTRASOUND FINDINGS:  Tricompartmental degenerative changes.  Moderate synovitis.  Moderate effusion.  DESCRIPTION OF PROCEDURE:  The patient's clinical condition is marked by substantial pain and/or significant functional disability. Other conservative therapy has not provided relief, is contraindicated, or not appropriate. There is a reasonable likelihood that injection will significantly improve the patient's pain and/or functional impairment.   After discussing the risks, benefits and expected outcomes of the injection and all questions were reviewed and answered, the patient wished to undergo the above named procedure.  Verbal consent was obtained.  The ultrasound was used to identify the target structure and adjacent neurovascular structures. The skin was then prepped in sterile fashion and the target structure was injected under direct visualization using sterile technique as below:  Bilateral injections performed under identical technique as below: PREP: Alcohol and Ethel Chloride APPROACH:superiolateral, single injection, 21g 2 in. INJECTATE: 2 cc 0.5% Marcaine and 1 cc 40mg /mL DepoMedrol ASPIRATE: None DRESSING: Band-Aid  Post procedural instructions including recommending icing and warning signs for infection were reviewed.    This procedure was well tolerated and there were no complications.   IMPRESSION: Succesful Ultrasound Guided: Injection

## 2017-12-25 ENCOUNTER — Encounter: Payer: Self-pay | Admitting: Family Medicine

## 2017-12-25 ENCOUNTER — Ambulatory Visit: Payer: 59 | Admitting: Family Medicine

## 2017-12-25 ENCOUNTER — Other Ambulatory Visit: Payer: Self-pay

## 2017-12-25 VITALS — BP 133/82 | HR 67 | Temp 98.8°F | Resp 16 | Ht 68.0 in | Wt 346.1 lb

## 2017-12-25 DIAGNOSIS — E119 Type 2 diabetes mellitus without complications: Secondary | ICD-10-CM | POA: Diagnosis not present

## 2017-12-25 LAB — BASIC METABOLIC PANEL
BUN: 21 mg/dL (ref 6–23)
CALCIUM: 9.4 mg/dL (ref 8.4–10.5)
CHLORIDE: 106 meq/L (ref 96–112)
CO2: 29 mEq/L (ref 19–32)
Creatinine, Ser: 1.1 mg/dL (ref 0.40–1.50)
GFR: 72.2 mL/min (ref >60.00)
Glucose, Bld: 107 mg/dL — ABNORMAL HIGH (ref 70–99)
Potassium: 4.4 mEq/L (ref 3.5–5.1)
Sodium: 141 mEq/L (ref 135–145)

## 2017-12-25 LAB — HEMOGLOBIN A1C: Hgb A1c MFr Bld: 5.9 % (ref 4.6–6.5)

## 2017-12-25 NOTE — Progress Notes (Signed)
   Subjective:    Patient ID: GIANI BETZOLD, male    DOB: [birth year], 62 y.o.   MRN: [id number]  HPI DM- chronic problem, on Januvia 100mg  daily, Metformin 1000mg  BID.  On ACE.  UTD on eye exam, foot exam.  Denies symptomatic lows.  CBGs 95-150s in the AM.  No CP, SOB above baseline, HAs above baseline, visual changes, abd pain, N/V.  No numbness/tingling of feet.  Obesity- pt's BMI is 52.6.  Walking w/ a walker, no regular exercise.  Work is active   Review of Systems  For ROS see HPI     Objective:   Physical Exam  Constitutional: He is oriented to person, place, and time. He appears well-developed and well-nourished. No distress.  Morbidly obese  HENT:  Head: Normocephalic and atraumatic.  Eyes: Pupils are equal, round, and reactive to light. Conjunctivae and EOM are normal.  Neck: Normal range of motion. Neck supple. No thyromegaly present.  Cardiovascular: Normal rate, regular rhythm, normal heart sounds and intact distal pulses.  No murmur heard. Pulmonary/Chest: Effort normal and breath sounds normal. No respiratory distress.  Abdominal: Soft. Bowel sounds are normal. He exhibits no distension.  Musculoskeletal: He exhibits no edema.  Lymphadenopathy:    He has no cervical adenopathy.  Neurological: He is alert and oriented to person, place, and time. No cranial nerve deficit.  Skin: Skin is warm and dry.  Psychiatric: He has a normal mood and affect. His behavior is normal.  Vitals reviewed.         Assessment & Plan:

## 2017-12-25 NOTE — Patient Instructions (Addendum)
Follow up in 3-4 months to recheck diabetes, BP, cholesterol We'll notify you of your lab results and make any changes if needed Continue to work on healthy diet and regular exercise- this is very important Call with any questions or concerns Happy Holidays!

## 2017-12-25 NOTE — Assessment & Plan Note (Signed)
Chronic problem.  Currently asymptomatic.  Hx of good control.  UTD on foot exam, eye exam, and on ACE for renal protection.  Stressed need for healthy diet and regular exercise.  Check labs.  Adjust meds prn

## 2017-12-25 NOTE — Assessment & Plan Note (Signed)
Ongoing issue for pt.  Stressed need for healthy diet and regular exercise.  This is the biggest risk to his health at this time but pt does not seem concerned.  Will continue

## 2017-12-26 ENCOUNTER — Encounter: Payer: Self-pay | Admitting: General Practice

## 2018-01-09 ENCOUNTER — Other Ambulatory Visit: Payer: Self-pay | Admitting: Family Medicine

## 2018-01-09 MED FILL — MELOXICAM 15 MG TABLET: 15 | 30 days supply | Qty: 30 | Fill #1

## 2018-01-09 MED FILL — GABAPENTIN 300 MG CAPSULE: 300 | 30 days supply | Qty: 90 | Fill #0

## 2018-01-16 ENCOUNTER — Encounter: Payer: Self-pay | Admitting: Cardiology

## 2018-02-10 ENCOUNTER — Other Ambulatory Visit: Payer: Self-pay | Admitting: Family Medicine

## 2018-02-10 ENCOUNTER — Other Ambulatory Visit: Payer: Self-pay | Admitting: Cardiology

## 2018-02-10 MED FILL — FENOFIBRATE 200 MG CAPSULE: 200 | 90 days supply | Qty: 90 | Fill #0

## 2018-02-10 MED FILL — AMLODIPINE BESYLATE 10 MG T: 10 | 90 days supply | Qty: 90 | Fill #1

## 2018-02-10 MED FILL — traZODone HCL 50 MG TABS: 50 | 90 days supply | Qty: 90 | Fill #0

## 2018-02-10 MED FILL — MELOXICAM 15 MG TABLET: 15 | 30 days supply | Qty: 30 | Fill #0

## 2018-02-10 NOTE — Telephone Encounter (Signed)
Please review for refill. Thanks!  

## 2018-02-13 ENCOUNTER — Ambulatory Visit: Payer: Self-pay | Admitting: Cardiology

## 2018-02-20 ENCOUNTER — Encounter: Payer: Self-pay | Admitting: Sports Medicine

## 2018-02-20 NOTE — Progress Notes (Signed)
Juanda Bond. Carmelina Balducci, [place] at [place]  ADIS STURGILL - 63 y.o. male MRN [id number]  Date of birth: Nov 30, 1955  Visit Date: 12/06/2019February 6, 2020  PCP: [name], MD   Referred by: [name], MD  SUBJECTIVE:   Chief Complaint  Patient presents with  . f/u B knee pain    using ice, taking Meloxicam or Tylenol prn. Responded well to B steroid inj 10/11/17. L knee pain started to flare-up 11/25/17, he has notices mild swelling, some radiating pain into the L posterior thigh.      HPI: Patient presents with bilateral knee pain that responded well to the initial injection.  Reports current pain is 3-7/10.  He has some radiation into the left thigh that is mild.  This improved following the last injection.  He is using ice Tylenol meloxicam with only intermittent improvement.  REVIEW OF SYSTEMS: Per HPI  HISTORY:  Prior history reviewed and updated per electronic medical record.  Patient Active Problem List   Diagnosis Date Noted  . Primary osteoarthritis of both knees 11/24/2017  . Insomnia 07/30/2016  . Carpal tunnel syndrome, bilateral 07/30/2016  . Lumbar radiculopathy 07/30/2016  . Physical exam 06/13/2016  . Obstructive sleep apnea 06/21/2014  . Personal history of malignant neoplasm of prostate 06/21/2014    dxd 2012 radioactive seed implants: Dr Ledon Snare RT [facility]; [facility], U[facility]   . Thrombocytopenia ([place]) 05/25/2014  . Dyslipidemia 04/27/2014  . Morbid obesity ([place]) 06/02/2013  . Frequent unifocal PVCs   . Coronary artery disease   . Hypertension   . Diabetes ([place]) 10/27/2012   Social History   Occupational History  . Occupation: Scientific laboratory technician: [employer]    Comment: mod/heavy lifting  Tobacco Use  . Smoking status: Never Smoker  . Smokeless tobacco: Never Used  Substance and Sexual Activity  . Alcohol use: No    Alcohol/week: 0.0 standard drinks  . Drug use:  No  . Sexual activity: Not on file   Social History   Social History Narrative  . Not on file    OBJECTIVE:  VS:  HT:5\' 8"  (172.7 cm)   WT:(!) 344 lb 9.6 oz (156.3 kg)  BMI:52.41    BP:(!) 142/82  HR:(!) 59bpm  TEMP: ( )  RESP:97 %   PHYSICAL EXAM: Adult morbidly obese male in no acute distress.  Alert and appropriate. Bilateral knees have a significantly large soft tissue envelope with a small effusion and ligamentously stable.  Extensor mechanism intact.  Valgus deformity.   ASSESSMENT:   1. Primary osteoarthritis of both knees     PROCEDURES:  US Guided Injection per procedure note      PLAN:  Pertinent additional documentation may be included in corresponding procedure notes, imaging studies, problem based documentation and patient instructions.  No problem-specific Assessment & Plan notes found for this encounter.   Repeat injections performed today.  He has done well with this.  We can consider alternative injections including Zilretta and/or Visco supplementation but he is doing well with standard injections at this time.  Continue previously prescribed home exercise program.   Ideally nonsteroidal anti-inflammatories (NSAIDs) should not be taken on a chronic daily basis.  Appropriate use of these medications involves "bursting" full prescription strength dosing of the medication over a relatively short amount of time.  Ideally taking full doses of anti-inflammatories as discussed for 3 to 5 days at a time then  stopping all NSAID use for as long as possible is the ideal way to reduce the risk of kidney/liver injury as well as GI bleeding.  Bursting a medication for up to 2 weeks to initiate then as needed for 3-5 days at a time (1-2 days after symptoms improve) can help minimize this risk.  This was discussed in detail with the patient today as well as appropriate GI prophylaxis with a PPI/H2 blocker.  Activity modifications and the importance of avoiding  exacerbating activities (limiting pain to no more than a 4 / 10 during or following activity) recommended and discussed.  Discussed red flag symptoms that warrant earlier emergent evaluation and patient voices understanding.   No orders of the defined types were placed in this encounter.  Lab Orders  No laboratory test(s) ordered today   Imaging Orders     [place] [facility] POCT ULTRASOUND Referral Orders  No referral(s) requested today    At follow up will plan to consider: repeat corticosteroid injections  Return in about 12 weeks (around 03/14/2018).          Gerda Diss, [facility] Physician

## 2018-03-03 MED FILL — GABAPENTIN 300 MG CAPSULE: 300 | 30 days supply | Qty: 90 | Fill #1

## 2018-03-05 ENCOUNTER — Telehealth: Payer: Self-pay | Admitting: Family Medicine

## 2018-03-05 ENCOUNTER — Ambulatory Visit: Payer: Self-pay | Admitting: Family Medicine

## 2018-03-05 NOTE — Telephone Encounter (Signed)
The pill for sleep should be taken prior to whatever his bedtime is- regardless of shift time. The other medications should be taken whenever his morning is (if morning meds) or whenever his evening is (if evening medication)

## 2018-03-05 NOTE — Telephone Encounter (Signed)
Please advise 

## 2018-03-05 NOTE — Telephone Encounter (Signed)
Patient notified of PCP recommendations and is agreement and expresses an understanding.   Ok for PEC to Discuss results / PCP recommendations / Schedule patient.   

## 2018-03-05 NOTE — Telephone Encounter (Signed)
See triage note below for question.  Pt of Dr. Birdie Riddle.  I have forwarded this triage note to Dr. Virgil Benedict nurse pool for further disposition.   Reason for Disposition . Caller has NON-URGENT medication question about med that PCP prescribed and triager unable to answer question  Answer Assessment - Initial Assessment Questions 1. SYMPTOMS: "Do you have any symptoms?"     He currently works 4:30am - 1:00 PM.   His shift is changing to 12:30 AM - 8:30 AM.   He wants to know how he should adjust his medications for this new schedule.   Especially his diabetes medication and the pill to help him sleep. 2. SEVERITY: If symptoms are present, ask "Are they mild, moderate or severe?"     No symptoms just the above question.  Pt of Dr. Birdie Riddle  Protocols used: MEDICATION QUESTION CALL-A-AH

## 2018-03-05 NOTE — Telephone Encounter (Signed)
Copied from Emily [id number]. Topic: Quick Communication - See Telephone Encounter >> Mar 05, 2018  9:36 AM Burchel, Abbi R wrote: CRM for notification. See Telephone encounter for: 03/05/18.  Pt states he is going to be switching to night shift at his job and would like to know if he needs to change the way he is taking his medications. Please call pt to advise.   [phone]

## 2018-03-13 MED FILL — LISINOPRIL 40 MG TABLET: 40 | 90 days supply | Qty: 90 | Fill #0

## 2018-03-13 MED FILL — metFORMIN HCL 500 MG TABS: 500 | 90 days supply | Qty: 360 | Fill #0

## 2018-03-13 MED FILL — ROSUVASTATIN CALCIUM 10 MG: 10 | 90 days supply | Qty: 90 | Fill #0

## 2018-03-13 MED FILL — MELOXICAM 15 MG TABLET: 15 | 30 days supply | Qty: 30 | Fill #1

## 2018-03-13 MED FILL — METOPROLOL SUCCINATE ER 100: 100 | 90 days supply | Qty: 90 | Fill #0

## 2018-03-13 MED FILL — JANUVIA 100 MG TABLET: 100 | 90 days supply | Qty: 90 | Fill #1

## 2018-03-14 ENCOUNTER — Ambulatory Visit: Payer: 59 | Admitting: Sports Medicine

## 2018-03-14 ENCOUNTER — Ambulatory Visit: Payer: Self-pay

## 2018-03-14 ENCOUNTER — Encounter: Payer: Self-pay | Admitting: Sports Medicine

## 2018-03-14 VITALS — BP 138/84 | HR 73 | Ht 68.0 in | Wt 351.4 lb

## 2018-03-14 DIAGNOSIS — M170 Bilateral primary osteoarthritis of knee: Secondary | ICD-10-CM | POA: Diagnosis not present

## 2018-03-14 NOTE — Patient Instructions (Addendum)

## 2018-03-14 NOTE — Procedures (Signed)
PROCEDURE NOTE:  Ultrasound Guided: Injection: Bilateral knee Images were obtained and interpreted by myself, Teresa Coombs, DO  Images have been saved and stored to PACS system. Images obtained on: GE S7 Ultrasound machine    ULTRASOUND FINDINGS:  Marked tricompartmental degenerative change.  Small effusion.  Generalized synovitis is mild.  DESCRIPTION OF PROCEDURE:  The patient's clinical condition is marked by substantial pain and/or significant functional disability. Other conservative therapy has not provided relief, is contraindicated, or not appropriate. There is a reasonable likelihood that injection will significantly improve the patient's pain and/or functional impairment.   After discussing the risks, benefits and expected outcomes of the injection and all questions were reviewed and answered, the patient wished to undergo the above named procedure.  Verbal consent was obtained.  The ultrasound was used to identify the target structure and adjacent neurovascular structures. The skin was then prepped in sterile fashion and the target structure was injected under direct visualization using sterile technique as below:  Single injection performed as below: PREP: Alcohol and Ethel Chloride APPROACH:superiolateral, single injection, 25g 1.5 in. INJECTATE: 2 cc 0.5% Marcaine and 1 cc 40mg /mL DepoMedrol ASPIRATE: None DRESSING: Band-Aid  Post procedural instructions including recommending icing and warning signs for infection were reviewed.    This procedure was well tolerated and there were no complications.   IMPRESSION: Succesful Ultrasound Guided: Injection

## 2018-03-14 NOTE — Progress Notes (Signed)
Travis [name]. Travis [name], [place] at [facility] [id number]  Travis [name] - 63 y.o. male MRN [id number]  Date of birth: 1955-06-11  Visit Date: March 16, 2018  PCP: [name], MD   Referred by: [name], MD  SUBJECTIVE:  Chief Complaint  Patient presents with  . Left Knee - Follow-up    XR L knee 02/08/04. Bilateral corticosteroid injections 12/20/17, 10/11/17. Meloxicam and Tylenol prn.   . Right Knee - Follow-up    HPI: Follow-up bilateral knee pain. Mild swelling  Improving well with the injections. Requesting repeat injections today.  Hospital move has increased some of his discomfort.  Increased crepitation without locking or giving way.  REVIEW OF SYSTEMS: No significant nighttime awakenings due to this issue.  HISTORY:  Prior history reviewed and updated per electronic medical record.  Patient Active Problem List   Diagnosis Date Noted  . Primary osteoarthritis of both knees 11/24/2017  . Insomnia 07/30/2016  . Carpal tunnel syndrome, bilateral 07/30/2016  . Lumbar radiculopathy 07/30/2016  . Physical exam 06/13/2016  . Obstructive sleep apnea 06/21/2014  . Personal history of malignant neoplasm of prostate 06/21/2014    dxd 2012 radioactive seed implants: Dr Ledon Snare RT Once; Donia Ast, Urology   . Thrombocytopenia ([place]) 05/25/2014  . Dyslipidemia 04/27/2014  . Morbid obesity ([place]) 06/02/2013  . Frequent unifocal PVCs   . Coronary artery disease   . Hypertension   . Diabetes ([place]) 10/27/2012   Social History   Occupational History  . 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 Smoking status: Never Smoker  . Smokeless tobacco: Never Used  Substance and Sexual Activity  . Alcohol use: No    Alcohol/week: 0.0 standard drinks  . Drug use: No  . Sexual activity: Not on file   Social History   Social History Narrative  . Not on file     OBJECTIVE:  VS:  HT:5\' 8"  (172.7 cm)   WT:(!) 351 lb 6.4 oz (159.4 kg)  BMI:53.44    BP:138/84  HR:73bpm  TEMP: ( )  RESP:96 %   PHYSICAL EXAM: Adult male. No acute distress.  Alert and appropriate. Large soft tissue envelopes.  Generalized osteoarthritic bossing.  Ligamentously stable.  Minimal restrictions in his range of motion.  Extensor mechanism intact.   ASSESSMENT:  1. Primary osteoarthritis of both knees     PROCEDURES:  US Guided Injection per procedure note      PLAN:  Pertinent additional documentation may be included in corresponding procedure notes, imaging studies, problem based documentation and patient instructions.  No problem-specific Assessment & Plan notes found for this encounter.   Bilateral knee injections performed again today.  These are providing adequate improvement in his symptoms.  Further treatment with other alternative treatments including Visco supplementation and/or Zilretta can be considered but he is happy with his symptomatic relief at this time we will continue to plan to do repeat injections every 10 to 12 weeks as needed.  Continue previously prescribed home exercise program.   Activity modifications and the importance of avoiding exacerbating activities (limiting pain to no more than a 4 / 10 during or following activity) recommended and discussed.  Discussed red flag symptoms that warrant earlier emergent evaluation and patient voices understanding.   No orders of the defined types were placed in this encounter.  Lab Orders  No laboratory test(s) ordered today  Imaging Orders     [place] [facility] POCT ULTRASOUND Referral Orders  No referral(s) requested today    Return in about 12 weeks (around 06/06/2018) for bilateral knee pain.          Gerda Diss, [place] [facility] Physician

## 2018-03-16 ENCOUNTER — Encounter: Payer: Self-pay | Admitting: Sports Medicine

## 2018-04-23 ENCOUNTER — Ambulatory Visit: Payer: Self-pay | Admitting: Family Medicine

## 2018-05-01 ENCOUNTER — Ambulatory Visit (INDEPENDENT_AMBULATORY_CARE_PROVIDER_SITE_OTHER): Payer: 59 | Admitting: Family Medicine

## 2018-05-01 ENCOUNTER — Ambulatory Visit: Payer: Self-pay | Admitting: Family Medicine

## 2018-05-01 ENCOUNTER — Encounter: Payer: Self-pay | Admitting: Family Medicine

## 2018-05-01 ENCOUNTER — Other Ambulatory Visit: Payer: Self-pay

## 2018-05-01 VITALS — BP 142/73 | HR 74 | Temp 97.2°F | Wt 333.0 lb

## 2018-05-01 DIAGNOSIS — E785 Hyperlipidemia, unspecified: Secondary | ICD-10-CM | POA: Diagnosis not present

## 2018-05-01 DIAGNOSIS — E119 Type 2 diabetes mellitus without complications: Secondary | ICD-10-CM | POA: Diagnosis not present

## 2018-05-01 DIAGNOSIS — I10 Essential (primary) hypertension: Secondary | ICD-10-CM

## 2018-05-01 NOTE — Progress Notes (Signed)
Virtual Visit via Video   I connected with Travis [name] on 05/01/18 at  3:00 PM EDT by a video enabled telemedicine application and verified that I am speaking with the correct person using two identifiers. Location patient: Home Location provider: Acup[facility]urist, Office Persons participating in the virtual visit: pt and myself  I discussed the limitations of evaluation and management by telemedicine and the availability of in person appointments. The patient expressed understanding and agreed to proceed.  Subjective:   HPI:  HTN- chronic problem, on Amlodipine 10mg  daily, Lisinopril 40mg  daily, Lasix 20mg  daily, Metoprolol 100mg  daily.  Mildly elevated today.  No CP, SOB, HAs, visual changes, edema.  Hyperlipidemia- chronic problem, on Crestor 10mg  daily and fenofibrate 200mg .  No abd pain, N/V.  DM- chronic problem, on Januvia 100mg  daily and Metformin 1000mg  BID.  On ACE for renal protection.  UTD on eye exam.  Due for foot exam- unable to do today.  Fasting CBGs 130-150, will decrease to 110s as the day goes on.  No numbness/tingling of hands/feet.  No symptomatic lows.  Obesity- pt is down 13 lbs since last visit as new job is much more active.  ROS: See pertinent positives and negatives per HPI.  Patient Active Problem List   Diagnosis Date Noted  . Primary osteoarthritis of both knees 11/24/2017  . Insomnia 07/30/2016  . Carpal tunnel syndrome, bilateral 07/30/2016  . Lumbar radiculopathy 07/30/2016  . Physical exam 06/13/2016  . Obstructive sleep apnea 06/21/2014  . Personal history of malignant neoplasm of prostate 06/21/2014  . Thrombocytopenia ([place]) 05/25/2014  . Dyslipidemia 04/27/2014  . Morbid obesity ([place]) 06/02/2013  . Frequent unifocal PVCs   . Coronary artery disease   . Hypertension   . Diabetes ([place]) 10/27/2012    Social History   Tobacco Use  . Smoking status: Never Smoker  . Smokeless tobacco: Never Used  Substance Use Topics  . Alcohol use:  No    Alcohol/week: 0.0 standard drinks    Current Outpatient Medications:  .  acetaminophen (TYLENOL) 325 MG tablet, Take 650 mg by mouth every 6 (six) hours as needed., Disp: , Rfl:  .  albuterol (PROVENTIL HFA;VENTOLIN HFA) 108 (90 Base) MCG/ACT inhaler, Inhale 2 puffs into the lungs every 6 (six) hours as needed for wheezing or shortness of breath., Disp: 1 Inhaler, Rfl: 0 .  amLODipine (NORVASC) 10 MG tablet, TAKE 1 TABLET BY MOUTH DAILY., Disp: 90 tablet, Rfl: 1 .  aspirin EC 81 MG tablet, Take 81 mg by mouth daily., Disp: , Rfl:  .  fenofibrate micronized (LOFIBRA) 200 MG capsule, TAKE 1 CAPSULE BY MOUTH DAILY BEFORE BREAKFAST, Disp: 90 capsule, Rfl: 1 .  furosemide (LASIX) 20 MG tablet, TAKE 1-2 TABLETS (20-40 MG TOTAL) BY MOUTH 2 (TWO) TIMES DAILY AS NEEDED FOR EDEMA (EDEMA)., Disp: 60 tablet, Rfl: 6 .  gabapentin (NEURONTIN) 300 MG capsule, TAKE 1 CAPSULE BY MOUTH 3 TIMES DAILY., Disp: 90 capsule, Rfl: 3 .  JANUVIA 100 MG tablet, TAKE 1 TABLET BY MOUTH DAILY., Disp: 90 tablet, Rfl: 1 .  lisinopril (PRINIVIL,ZESTRIL) 40 MG tablet, TAKE 1 TABLET BY MOUTH DAILY., Disp: 90 tablet, Rfl: 1 .  meloxicam (MOBIC) 15 MG tablet, TAKE 1 TABLET BY MOUTH DAILY., Disp: 30 tablet, Rfl: 1 .  metFORMIN (GLUCOPHAGE) 500 MG tablet, TAKE 2 TABLETS BY MOUTH 2 TIMES DAILY WITH A MEAL., Disp: 360 tablet, Rfl: 1 .  metoprolol succinate (TOPROL-XL) 100 MG 24 hr tablet, Take 1 tablet (100 mg total)  by mouth daily. With or immediately following a meal., Disp: 90 tablet, Rfl: 0 .  promethazine-dextromethorphan (PROMETHAZINE-DM) 6.25-15 MG/5ML syrup, Take 5 mLs by mouth 4 (four) times daily as needed., Disp: 240 mL, Rfl: 0 .  rosuvastatin (CRESTOR) 10 MG tablet, Take 1 tablet (10 mg total) by mouth daily., Disp: 90 tablet, Rfl: 0 .  traZODone (DESYREL) 50 MG tablet, TAKE 1/2 TO 1 TABLET BY MOUTH AT BEDTIME AS NEEDED FOR SLEEP, Disp: 90 tablet, Rfl: 1 .  TRUE METRIX BLOOD GLUCOSE TEST test strip, , Disp: , Rfl: 1 .   TRUEPLUS LANCETS 30G MISC, , Disp: , Rfl: 1  Allergies  Allergen Reactions  . Crestor [Rosuvastatin]     Muscle Pain   . Lipitor [Atorvastatin] Other (See Comments)    Arm weakness     Objective:   BP (!) 142/73   Pulse 74   Temp (!) 97.2 F (36.2 C) (Oral)   Wt (!) 333 lb (151 kg)   BMI 50.63 kg/m  AAOx3, NAD, obese NCAT, EOMI No obvious CN deficits Coloring WNL Pt is able to speak clearly, coherently without shortness of breath or increased work of breathing.  Thought process is linear.  Mood is appropriate.   Assessment and Plan:   HTN- elevated today.  Will repeat BP when pt comes for labs tomorrow.  Pt feels this is due to work stress.  Asymptomatic at this time.  Will follow.  Hyperlipidemia- chronic problem.  Tolerating statin w/o difficulty.  Check labs.  Adjust meds prn   DM- chronic problem.  Hx of good control.  Asymptomatic at this time.  Due for foot exam but unable to perform via video.  Check labs.  Adjust meds prn   Obesity- pt has lost 13 lbs since starting his new job.  Applauded his efforts.  Will continue to follow.   [name], MD 05/01/2018

## 2018-05-02 ENCOUNTER — Other Ambulatory Visit (INDEPENDENT_AMBULATORY_CARE_PROVIDER_SITE_OTHER): Payer: 59

## 2018-05-02 DIAGNOSIS — E119 Type 2 diabetes mellitus without complications: Secondary | ICD-10-CM

## 2018-05-02 DIAGNOSIS — E785 Hyperlipidemia, unspecified: Secondary | ICD-10-CM | POA: Diagnosis not present

## 2018-05-02 DIAGNOSIS — I10 Essential (primary) hypertension: Secondary | ICD-10-CM

## 2018-05-02 LAB — HEPATIC FUNCTION PANEL
ALT: 31 U/L (ref 0–53)
AST: 43 U/L — ABNORMAL HIGH (ref 0–37)
Albumin: 4.4 g/dL (ref 3.5–5.2)
Alkaline Phosphatase: 55 U/L (ref 39–117)
Bilirubin, Direct: 0.1 mg/dL (ref 0.0–0.3)
Total Bilirubin: 0.5 mg/dL (ref 0.2–1.2)
Total Protein: 7 g/dL (ref 6.0–8.3)

## 2018-05-02 LAB — CBC WITH DIFFERENTIAL/PLATELET
Basophils Absolute: 0 10*3/uL (ref 0.0–0.1)
Basophils Relative: 0.3 % (ref 0.0–3.0)
Eosinophils Absolute: 0 10*3/uL (ref 0.0–0.7)
Eosinophils Relative: 0.2 % (ref 0.0–5.0)
HCT: 38.7 % — ABNORMAL LOW (ref 39.0–52.0)
Hemoglobin: 13.3 g/dL (ref 13.0–17.0)
Lymphocytes Relative: 17.1 % (ref 12.0–46.0)
Lymphs Abs: 1.3 10*3/uL (ref 0.7–4.0)
MCHC: 34.4 g/dL (ref 30.0–36.0)
MCV: 90 fl (ref 78.0–100.0)
Monocytes Absolute: 0.7 10*3/uL (ref 0.1–1.0)
Monocytes Relative: 9.1 % (ref 3.0–12.0)
Neutro Abs: 5.7 10*3/uL (ref 1.4–7.7)
Neutrophils Relative %: 73.3 % (ref 43.0–77.0)
Platelets: 178 10*3/uL (ref 150.0–400.0)
RBC: 4.3 Mil/uL (ref 4.22–5.81)
RDW: 13.8 % (ref 11.5–15.5)
WBC: 7.8 10*3/uL (ref 4.0–10.5)

## 2018-05-02 LAB — BASIC METABOLIC PANEL
BUN: 21 mg/dL (ref 6–23)
CO2: 26 mEq/L (ref 19–32)
Calcium: 9.5 mg/dL (ref 8.4–10.5)
Chloride: 105 mEq/L (ref 96–112)
Creatinine, Ser: 0.92 mg/dL (ref 0.40–1.50)
GFR: 83.39 mL/min (ref >60.00)
Glucose, Bld: 90 mg/dL (ref 70–99)
Potassium: 4.4 mEq/L (ref 3.5–5.1)
Sodium: 141 mEq/L (ref 135–145)

## 2018-05-02 LAB — TSH: TSH: 1.24 u[IU]/mL (ref 0.35–4.50)

## 2018-05-02 LAB — LIPID PANEL
Cholesterol: 135 mg/dL (ref 0–200)
HDL: 31.6 mg/dL — ABNORMAL LOW (ref >39.00)
LDL Cholesterol: 65 mg/dL (ref 0–99)
NonHDL: 103.43
Total CHOL/HDL Ratio: 4
Triglycerides: 194 mg/dL — ABNORMAL HIGH (ref 0.0–149.0)
VLDL: 38.8 mg/dL (ref 0.0–40.0)

## 2018-05-02 LAB — HEMOGLOBIN A1C: Hgb A1c MFr Bld: 6.2 % (ref 4.6–6.5)

## 2018-05-16 ENCOUNTER — Other Ambulatory Visit (INDEPENDENT_AMBULATORY_CARE_PROVIDER_SITE_OTHER): Payer: 59

## 2018-05-16 VITALS — BP 132/70

## 2018-05-16 DIAGNOSIS — R740 Nonspecific elevation of levels of transaminase and lactic acid dehydrogenase [LDH]: Secondary | ICD-10-CM | POA: Diagnosis not present

## 2018-05-16 DIAGNOSIS — R7401 Elevation of levels of liver transaminase levels: Secondary | ICD-10-CM

## 2018-05-16 LAB — HEPATIC FUNCTION PANEL
ALT: 24 U/L (ref 0–53)
AST: 27 U/L (ref 0–37)
Albumin: 4.3 g/dL (ref 3.5–5.2)
Alkaline Phosphatase: 59 U/L (ref 39–117)
Bilirubin, Direct: 0.2 mg/dL (ref 0.0–0.3)
Total Bilirubin: 0.5 mg/dL (ref 0.2–1.2)
Total Protein: 6.8 g/dL (ref 6.0–8.3)

## 2018-05-20 ENCOUNTER — Other Ambulatory Visit: Payer: Self-pay | Admitting: Family Medicine

## 2018-05-20 MED FILL — FENOFIBRATE 200 MG CAPSULE: 200 | 90 days supply | Qty: 90 | Fill #0

## 2018-05-20 MED FILL — traZODone HCL 50 MG TABS: 50 | 90 days supply | Qty: 90 | Fill #0

## 2018-05-20 MED FILL — GABAPENTIN 300 MG CAPSULE: 300 | 30 days supply | Qty: 90 | Fill #0 | Status: TO

## 2018-05-21 MED FILL — AMLODIPINE BESYLATE 10 MG T: 10 | 90 days supply | Qty: 90 | Fill #0

## 2018-05-21 MED FILL — MELOXICAM 15 MG TABLET: 15 | 30 days supply | Qty: 30 | Fill #0 | Status: TO

## 2018-06-06 ENCOUNTER — Ambulatory Visit: Payer: 59 | Admitting: Sports Medicine

## 2018-06-09 NOTE — Progress Notes (Signed)
[name] [address] [place], [place] [address] Phone: [phone] Subjective:   I Travis [name] am serving as a Education administrator for Dr. Hulan Saas.   CC: Bilateral knee pain  IWO:[id number]  Travis [name] is a 63 y.o. male coming in with complaint of bilateral knee pain. Would like injections today.  Patient was seen another provider previously.  Had responded well to injections.  Patient continues to try to walk on a regular basis.  Knows that shoe choices could be better.  Patient is unable to work on weight loss at the moment secondary to work with him being tired as well as the increasing pain in the knees.  Denies any significant instability at the moment but.     Past Medical History:  Diagnosis Date   Appendicitis    Blood in stool    Coronary artery disease 1997   s/p PCI by Dr. Glade Lloyd   Diverticulosis of colon with hemorrhage 2009   Dyslipidemia, goal LDL below 70    Edema    Encounter for long-term (current) use of other medications    Essential hypertension, benign    Family history of thyroid disease    Frequent unifocal PVCs    Morbid obesity ([place])    Myalgia and myositis, unspecified    Prostate cancer ([place]) 2012   Sleep apnea with use of continuous positive airway pressure (CPAP)    Thrombocytopenia ([place]) 05/25/2014   Type II or unspecified type diabetes mellitus without mention of complication, not stated as uncontrolled    Past Surgical History:  Procedure Laterality Date   ANGIOPLASTY  1997   Dr. 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  Social Needs   Financial resource strain: Not on file   Food insecurity:      Worry: Not on file    Inability: Not on file   Transportation needs:    Medical: Not on file    Non-medical: Not on file  Tobacco Use   Smoking status: Never Smoker   Smokeless tobacco: Never Used  Substance and Sexual Activity   Alcohol use: No    Alcohol/week: 0.0 standard drinks   Drug use: No   Sexual activity: Not on file  Lifestyle   Physical activity:    Days per week: Not on file    Minutes per session: Not on file   Stress: Not on file  Relationships   Social connections:    Talks on phone: Not on file    Gets together: Not on file    Attends religious service: Not on file    Active member of club or organization: Not on file    Attends meetings of clubs or organizations: Not on file    Relationship status: Not on file  Other Topics Concern   Not on file  Social History Narrative   Not on file   Allergies  Allergen Reactions   Crestor [Rosuvastatin]     Muscle Pain    Lipitor [Atorvastatin] Other (See Comments)    Arm weakness    Family History  Problem Relation Age of Onset  CVA Father    Hypertension Father    Diabetes Mother        DM   Hypothyroidism Sister    Hypertension Sister     Current Outpatient Medications (Endocrine & Metabolic):    JANUVIA 001 MG tablet, TAKE 1 TABLET BY MOUTH DAILY.   metFORMIN (GLUCOPHAGE) 500 MG tablet, TAKE 2 TABLETS BY MOUTH 2 TIMES DAILY WITH A MEAL.  Current Outpatient Medications (Cardiovascular):    amLODipine (NORVASC) 10 MG tablet, TAKE 1 TABLET BY MOUTH DAILY.   fenofibrate micronized (LOFIBRA) 200 MG capsule, TAKE 1 CAPSULE BY MOUTH DAILY BEFORE BREAKFAST   furosemide (LASIX) 20 MG tablet, TAKE 1-2 TABLETS (20-40 MG TOTAL) BY MOUTH 2 (TWO) TIMES DAILY AS NEEDED FOR EDEMA (EDEMA).   lisinopril (PRINIVIL,ZESTRIL) 40 MG tablet, TAKE 1 TABLET BY MOUTH DAILY.   metoprolol succinate (TOPROL-XL) 100 MG 24 hr tablet, Take 1 tablet (100 mg total) by mouth daily. With or immediately  following a meal.   rosuvastatin (CRESTOR) 10 MG tablet, Take 1 tablet (10 mg total) by mouth daily.  Current Outpatient Medications (Respiratory):    albuterol (PROVENTIL HFA;VENTOLIN HFA) 108 (90 Base) MCG/ACT inhaler, Inhale 2 puffs into the lungs every 6 (six) hours as needed for wheezing or shortness of breath.   promethazine-dextromethorphan (PROMETHAZINE-DM) 6.25-15 MG/5ML syrup, Take 5 mLs by mouth 4 (four) times daily as needed.  Current Outpatient Medications (Analgesics):    acetaminophen (TYLENOL) 325 MG tablet, Take 650 mg by mouth every 6 (six) hours as needed.   aspirin EC 81 MG tablet, Take 81 mg by mouth daily.   meloxicam (MOBIC) 15 MG tablet, TAKE 1 TABLET BY MOUTH DAILY.   Current Outpatient Medications (Other):    gabapentin (NEURONTIN) 300 MG capsule, TAKE 1 CAPSULE BY MOUTH 3 TIMES DAILY.   traZODone (DESYREL) 50 MG tablet, TAKE 1/2 TO 1 TABLET BY MOUTH AT BEDTIME AS NEEDED FOR SLEEP   TRUE METRIX BLOOD GLUCOSE TEST test strip,    TRUEPLUS LANCETS 30G MISC,    Diclofenac Sodium (PENNSAID) 2 % SOLN, Place 2 g onto the skin 2 (two) times daily.    Past medical history, social, surgical and family history all reviewed in electronic medical record.  No pertanent information unless stated regarding to the chief complaint.   Review of Systems:  No headache, visual changes, nausea, vomiting, diarrhea, constipation, dizziness, abdominal pain, skin rash, fevers, chills, night sweats, weight loss, swollen lymph nodes, , chest pain, shortness of breath, mood changes.  Positive muscle aches and joint swelling  Objective  Blood pressure (!) 142/80, pulse 68, height 5\' 8"  (1.727 m), SpO2 97 %.   General: No apparent distress alert and oriented x3 mood and affect normal, dressed appropriately. Obese  HEENT: Pupils equal, extraocular movements intact  Respiratory: Patient's speak in full sentences and does not appear short of breath  Cardiovascular: No lower  extremity edema, non tender, no erythema  Skin: Warm dry intact with no signs of infection or rash on extremities or on axial skeleton.  Abdomen: Soft nontender  Neuro: Cranial nerves II through XII are intact, neurovascularly intact in all extremities with 2+ DTRs and 2+ pulses.  Lymph: No lymphadenopathy of posterior or anterior cervical chain or axillae bilaterally.  Gait Antalgic  MSK:  Non tender with full range of motion and good stability and symmetric strength and tone of shoulders, elbows, wrist, hip, knee and ankles bilaterally.  Knee:bilateral  valgus deformity noted. Large thigh to calf ratio.  Tender to  palpation over medial and PF joint line.  ROM full in flexion and extension and lower leg rotation. instability with valgus force.  painful patellar compression. Patellar glide with moderate crepitus. Patellar and quadriceps tendons unremarkable. Hamstring and quadriceps strength is normal.   After informed written and verbal consent, patient was seated on exam table. Right knee was prepped with alcohol swab and utilizing anterolateral approach, patient's right knee space was injected with 4:1  marcaine 0.5%: Kenalog 40mg /dL. Patient tolerated the procedure well without immediate complications.  After informed written and verbal consent, patient was seated on exam table. Left knee was prepped with alcohol swab and utilizing anterolateral approach, patient's left knee space was injected with 4:1  marcaine 0.5%: Kenalog 40mg /dL. Patient tolerated the procedure well without immediate complications.   Impression and Recommendations:     This case required medical decision making of moderate complexity. The above documentation has been reviewed and is accurate and complete Travis Pulley, DO       Note: This dictation was prepared with Dragon dictation along with smaller phrase technology. Any transcriptional errors that result from this process are unintentional.

## 2018-06-10 ENCOUNTER — Ambulatory Visit (INDEPENDENT_AMBULATORY_CARE_PROVIDER_SITE_OTHER): Payer: 59 | Admitting: Family Medicine

## 2018-06-10 ENCOUNTER — Ambulatory Visit (INDEPENDENT_AMBULATORY_CARE_PROVIDER_SITE_OTHER)
Admission: RE | Admit: 2018-06-10 | Discharge: 2018-06-10 | Disposition: A | Discharge status: Home / Self Care | Payer: 59 | Source: Ambulatory Visit | Attending: Family Medicine | Admitting: Family Medicine

## 2018-06-10 ENCOUNTER — Other Ambulatory Visit: Payer: Self-pay

## 2018-06-10 ENCOUNTER — Encounter: Payer: Self-pay | Admitting: Family Medicine

## 2018-06-10 VITALS — BP 142/80 | HR 68 | Ht 68.0 in

## 2018-06-10 DIAGNOSIS — M170 Bilateral primary osteoarthritis of knee: Secondary | ICD-10-CM

## 2018-06-10 DIAGNOSIS — M25562 Pain in left knee: Secondary | ICD-10-CM

## 2018-06-10 DIAGNOSIS — M25561 Pain in right knee: Secondary | ICD-10-CM | POA: Diagnosis not present

## 2018-06-10 DIAGNOSIS — G8929 Other chronic pain: Secondary | ICD-10-CM

## 2018-06-10 MED ORDER — DICLOFENAC SODIUM 2 % TD SOLN: 2.0000 g | Freq: Two times a day (BID) | TRANSDERMAL | 3 refills | Status: DC

## 2018-06-10 NOTE — Patient Instructions (Signed)
Great to see you  Ice 20 minutes 2 times daily. Usually after activity and before bed. pennsaid pinkie amount topically 2 times daily as needed.  Knee xrays downstairs Over the counter get  Vitamin D 2000 IU daily  Turmeric 500mg  daily  Tart cherry extract any dose at night  See me again in 5-6 weeks

## 2018-06-10 NOTE — Assessment & Plan Note (Signed)
Interventions previously: Injections previously   Interventions this visit: Injections given today 10/11/2018.  Discussed topical anti-inflammatories were prescribed We discussed with patient the importance ergonomics, home exercises, icing regimen, and over-the-counter natural products.  Encourage weight loss also prescribed meloxicam for breakthrough pain   Future considerations: Discussed possibility of viscosupplementation,  Return to clinic: 4 to 6 weeks

## 2018-06-11 ENCOUNTER — Telehealth: Payer: Self-pay | Admitting: Emergency Medicine

## 2018-06-11 NOTE — Telephone Encounter (Signed)
Sent patient a MyChart message

## 2018-06-11 NOTE — Telephone Encounter (Signed)
Copied from [place] [id number]. Topic: Quick Communication - See Telephone Encounter >> Jun 11, 2018  9:33 AM Ahmed Prima L wrote: CRM for notification. See Telephone encounter for: 06/11/18.  Patient said he needs Diclofenac Sodium (PENNSAID) 2 % SOLN sent to [facility]. He was under the impression it was being sent there yesterday. Please Advise.

## 2018-06-20 ENCOUNTER — Other Ambulatory Visit: Payer: Self-pay | Admitting: Cardiology

## 2018-06-20 ENCOUNTER — Other Ambulatory Visit: Payer: Self-pay | Admitting: Family Medicine

## 2018-06-20 MED FILL — ROSUVASTATIN CALCIUM 10 MG: 10 | 90 days supply | Qty: 90 | Fill #0

## 2018-06-20 MED FILL — JANUVIA 100 MG TABLET: 100 | 90 days supply | Qty: 90 | Fill #0

## 2018-06-20 MED FILL — LISINOPRIL 40 MG TABLET: 40 | 90 days supply | Qty: 90 | Fill #1

## 2018-06-20 MED FILL — MELOXICAM 15 MG TABLET: 15 | 30 days supply | Qty: 30 | Fill #0

## 2018-06-20 MED FILL — metFORMIN HCL 500 MG TABS: 500 | 90 days supply | Qty: 360 | Fill #1

## 2018-06-20 MED FILL — METOPROLOL SUCCINATE ER 100: 100 | 90 days supply | Qty: 90 | Fill #0

## 2018-06-20 MED FILL — GABAPENTIN 300 MG CAPSULE: 300 | 30 days supply | Qty: 90 | Fill #0

## 2018-07-15 ENCOUNTER — Other Ambulatory Visit: Payer: Self-pay

## 2018-07-15 ENCOUNTER — Encounter: Payer: Self-pay | Admitting: Family Medicine

## 2018-07-15 ENCOUNTER — Ambulatory Visit (INDEPENDENT_AMBULATORY_CARE_PROVIDER_SITE_OTHER): Payer: 59 | Admitting: Family Medicine

## 2018-07-15 DIAGNOSIS — M170 Bilateral primary osteoarthritis of knee: Secondary | ICD-10-CM | POA: Diagnosis not present

## 2018-07-15 NOTE — Progress Notes (Signed)
[name] [address] [place], [place] [address] Phone: [phone] Subjective:   Travis [name], am serving as a scribe for Dr. Hulan Saas.    CC: Bilateral knee pain  RWE:[id number]   06/10/2018: Interventions this visit: Injections given today 10/11/2018.  Discussed topical anti-inflammatories were prescribed We discussed with patient the importance ergonomics, home exercises, icing regimen, and over-the-counter natural products.  Encourage weight loss also prescribed meloxicam for breakthrough pain  Update 07/15/2018: Travis [name] is a 63 y.o. male coming in with complaint of bilateral knee pain. Patient states that the injections did helped. Pain returned one week ago. Feels a tightness in his knees. Is using pennsaid prn.   Patient does have significant degenerative joint disease.  Steroid injection was given at last exam with very minimal improvement.  Still having some instability.  Walking with the aid of a walker.  Past Medical History:  Diagnosis Date  . Appendicitis   . Blood in stool   . Coronary artery disease 1997   s/p PCI by Dr. Glade Lloyd  . Diverticulosis of colon with hemorrhage 2009  . Dyslipidemia, goal LDL below 70   . Edema   . Encounter for long-term (current) use of other medications   . Essential hypertension, benign   . Family history of thyroid disease   . Frequent unifocal PVCs   . Morbid obesity ([place])   . Myalgia and myositis, unspecified   . Prostate cancer ([place]) 2012  . Sleep apnea with use of continuous positive airway pressure (CPAP)   . Thrombocytopenia ([place]) 05/25/2014  . Type II or unspecified type diabetes mellitus without mention of complication, not stated as uncontrolled    Past Surgical History:  Procedure Laterality Date  . ANGIOPLASTY  1997   Dr. Glade Lloyd  . APPENDECTOMY     Social History   Socioeconomic History  . Marital status: Married    Spouse name: Not on file  . Number of children:  Not on file  . Years of education: Not on file  . Highest education level: Not on file  Occupational History  . 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 Financial resource strain: Not on file  . Food insecurity    Worry: Not on file    Inability: Not on file  . Transportation needs    Medical: Not on file    Non-medical: Not on file  Tobacco Use  . Smoking status: Never Smoker  . Smokeless tobacco: Never Used  Substance and Sexual Activity  . Alcohol use: [name]    Alcohol/week: 0.0 standard drinks  . Drug use: [name]  . Sexual activity: Not on file  Lifestyle  . Physical activity    Days per week: Not on file    Minutes per session: Not on file  . Stress: Not on file  Relationships  . Social Herbalist on phone: Not on file    Gets together: Not on file    Attends religious service: Not on file    Active member of club or organization: Not on file    Attends meetings of clubs or organizations: Not on file    Relationship status: Not on file  Other Topics Concern  . Not on file  Social History Narrative  . Not on file   Allergies  Allergen Reactions  . Crestor [Rosuvastatin]     Muscle Pain   .  Lipitor [Atorvastatin] Other (See Comments)    Arm weakness    Family History  Problem Relation Age of Onset  . CVA Father   . Hypertension Father   . Diabetes Mother        DM  . Hypothyroidism Sister   . Hypertension Sister     Current Outpatient Medications (Endocrine & Metabolic):  [name]  JANUVIA 100 MG tablet, TAKE 1 TABLET BY MOUTH DAILY. .  metFORMIN (GLUCOPHAGE) 500 MG tablet, TAKE 2 TABLETS BY MOUTH 2 TIMES DAILY WITH A MEAL.  Current Outpatient Medications (Cardiovascular):  .  amLODipine (NORVASC) 10 MG tablet, TAKE 1 TABLET BY MOUTH DAILY. .  fenofibrate micronized (LOFIBRA) 200 MG capsule, TAKE 1 CAPSULE BY MOUTH DAILY BEFORE BREAKFAST .  furosemide (LASIX) 20 MG tablet, TAKE 1-2 TABLETS (20-40 MG  TOTAL) BY MOUTH 2 (TWO) TIMES DAILY AS NEEDED FOR EDEMA (EDEMA). [name]  lisinopril (PRINIVIL,ZESTRIL) 40 MG tablet, TAKE 1 TABLET BY MOUTH DAILY. .  metoprolol succinate (TOPROL-XL) 100 MG 24 hr tablet, Take 1 tablet (100 mg total) by mouth daily. Please keep upcoming appt for future refills. Thank you .  rosuvastatin (CRESTOR) 10 MG tablet, Take 1 tablet (10 mg total) by mouth daily. Please keep upcoming appt for future refills. Thank you  Current Outpatient Medications (Respiratory):  .  albuterol (PROVENTIL HFA;VENTOLIN HFA) 108 (90 Base) MCG/ACT inhaler, Inhale 2 puffs into the lungs every 6 (six) hours as needed for wheezing or shortness of breath. .  promethazine-dextromethorphan (PROMETHAZINE-DM) 6.25-15 MG/5ML syrup, Take 5 mLs by mouth 4 (four) times daily as needed.  Current Outpatient Medications (Analgesics):  .  acetaminophen (TYLENOL) 325 MG tablet, Take 650 mg by mouth every 6 (six) hours as needed. [name]  aspirin EC 81 MG tablet, Take 81 mg by mouth daily. .  meloxicam (MOBIC) 15 MG tablet, TAKE 1 TABLET BY MOUTH DAILY.   Current Outpatient Medications (Other):  [name]  Diclofenac Sodium (PENNSAID) 2 % SOLN, Place 2 g onto the skin 2 (two) times daily. [name]  gabapentin (NEURONTIN) 300 MG capsule, TAKE 1 CAPSULE BY MOUTH 3 TIMES DAILY. .  traZODone (DESYREL) 50 MG tablet, TAKE 1/2 TO 1 TABLET BY MOUTH AT BEDTIME AS NEEDED FOR SLEEP .  TRUE METRIX BLOOD GLUCOSE TEST test strip,  .  TRUEPLUS LANCETS 30G MISC,     Past medical history, social, surgical and family history all reviewed in electronic medical record.  [name] pertanent information unless stated regarding to the chief complaint.   Review of Systems:  [name] headache, visual changes, nausea, vomiting, diarrhea, constipation, dizziness, abdominal pain, skin rash, fevers, chills, night sweats, weight loss, swollen lymph nodes, body aches, joint swelling,  chest pain, shortness of breath, mood changes.  Positive muscle aches  Objective   Blood pressure 122/80, pulse 70, height 5\' 8"  (1.727 m), SpO2 98 %.    General: [name] apparent distress alert and oriented x3 mood and affect normal, dressed appropriately.  HEENT: Pupils equal, extraocular movements intact  Respiratory: Patient's speak in full sentences and does not appear short of breath  Cardiovascular: Trace lower extremity edema, mild tender, [name] erythema  Skin: Warm dry intact with [name] signs of infection or rash on extremities or on axial skeleton.  Abdomen: Soft nontender morbidly obese Neuro: Cranial nerves II through XII are intact, neurovascularly intact in all extremities with 2+ DTRs and 2+ pulses.  Lymph: [name] lymphadenopathy of posterior or anterior cervical chain or axillae bilaterally.  Gait severely antalgic MSK:  Non tender  with mild limited range of motion and good stability and symmetric strength and tone of shoulders, elbows, wrist, hip, and ankles bilaterally.  Knee: Bilateral valgus deformity noted. Large thigh to calf ratio.  Tender to palpation over medial and PF joint line.  ROM full in flexion and extension and lower leg rotation. instability with valgus force.  painful patellar compression. Patellar glide with moderate crepitus. Patellar and quadriceps tendons unremarkable. Hamstring and quadriceps strength is normal.   After informed written and verbal consent, patient was seated on exam table. Right knee was prepped with alcohol swab and utilizing anterolateral approach, patient's right knee space was injected with 22 mg/mL of Monovisc (sodium hyaluronate) in a prefilled syringe was injected easily into the knee through a 22-gauge needle..Patient tolerated the procedure well without immediate complications.  After informed written and verbal consent, patient was seated on exam table. Left knee was prepped with alcohol swab and utilizing anterolateral approach, patient's left knee space was injected with 22 mg/mL of Monovisc (sodium hyaluronate) in a  prefilled syringe was injected easily into the knee through a 22-gauge needle..Patient tolerated the procedure well without immediate complications.   Impression and Recommendations:     This case required medical decision making of moderate complexity. The above documentation has been reviewed and is accurate and complete Lyndal Pulley, DO       Note: This dictation was prepared with Dragon dictation along with smaller phrase technology. Any transcriptional errors that result from this process are unintentional.

## 2018-07-15 NOTE — Patient Instructions (Addendum)
Good to see you  See me in 6 weeks 

## 2018-07-15 NOTE — Assessment & Plan Note (Signed)
Viscosupplementation given today.  I do believe patient will do well with this.  Encourage weight loss and control of the diabetes.  Discussed icing regimen, over-the-counter medications.  Discussed over-the-counter insoles for shoes that can help with alignment as well.  Follow-up again in 6 to 8 weeks

## 2018-07-21 ENCOUNTER — Ambulatory Visit: Payer: Self-pay | Admitting: Cardiology

## 2018-07-21 ENCOUNTER — Other Ambulatory Visit: Payer: Self-pay | Admitting: Family Medicine

## 2018-07-21 MED FILL — GABAPENTIN 300 MG CAPSULE: 300 | 30 days supply | Qty: 90 | Fill #0

## 2018-07-21 MED FILL — MELOXICAM 15 MG TABLET: 15 | 30 days supply | Qty: 30 | Fill #0

## 2018-08-13 ENCOUNTER — Telehealth: Payer: Self-pay | Admitting: *Deleted

## 2018-08-13 NOTE — Telephone Encounter (Signed)
Pt has answered NO to all covid19 prescreen questions.       COVID-19 Pre-Screening Questions:  . In the past 7 to 10 days have you had a cough,  shortness of breath, headache, congestion, fever (100 or greater) body aches, chills, sore throat, or sudden loss of taste or sense of smell? . Have you been around anyone with known Covid 19. . Have you been around anyone who is awaiting Covid 19 test results in the past 7 to 10 days? . Have you been around anyone who has been exposed to Covid 19, or has mentioned symptoms of Covid 19 within the past 7 to 10 days?  If you have any concerns/questions about symptoms patients report during screening (either on the phone or at threshold). Contact the provider seeing the patient or DOD for further guidance.  If neither are available contact a member of the leadership team.

## 2018-08-14 ENCOUNTER — Ambulatory Visit (INDEPENDENT_AMBULATORY_CARE_PROVIDER_SITE_OTHER): Payer: 59 | Admitting: Cardiology

## 2018-08-14 ENCOUNTER — Encounter: Payer: Self-pay | Admitting: Cardiology

## 2018-08-14 ENCOUNTER — Other Ambulatory Visit: Payer: Self-pay

## 2018-08-14 VITALS — BP 148/76 | HR 70 | Ht 68.0 in | Wt 346.0 lb

## 2018-08-14 DIAGNOSIS — E785 Hyperlipidemia, unspecified: Secondary | ICD-10-CM | POA: Diagnosis not present

## 2018-08-14 DIAGNOSIS — I272 Pulmonary hypertension, unspecified: Secondary | ICD-10-CM | POA: Diagnosis not present

## 2018-08-14 DIAGNOSIS — I2510 Atherosclerotic heart disease of native coronary artery without angina pectoris: Secondary | ICD-10-CM

## 2018-08-14 DIAGNOSIS — G4733 Obstructive sleep apnea (adult) (pediatric): Secondary | ICD-10-CM | POA: Diagnosis not present

## 2018-08-14 DIAGNOSIS — I10 Essential (primary) hypertension: Secondary | ICD-10-CM

## 2018-08-14 DIAGNOSIS — I493 Ventricular premature depolarization: Secondary | ICD-10-CM | POA: Diagnosis not present

## 2018-08-14 HISTORY — DX: Pulmonary hypertension, unspecified: I27.20

## 2018-08-14 NOTE — Patient Instructions (Addendum)
Medication Instructions:  Your physician recommends that you continue on your current medications as directed. Please refer to the Current Medication list given to you today.  Labwork: None ordered.  Testing/Procedures: Your physician has requested that you have an echocardiogram. Echocardiography is a painless test that uses sound waves to create images of your heart. It provides your doctor with information about the size and shape of your heart and how well your heart's chambers and valves are working. This procedure takes approximately one hour. There are no restrictions for this procedure.   Follow-Up: Your physician recommends that you schedule a follow-up appointment in:   12 months with Dr. [name]  Any Other Special Instructions Will Be Listed Below (If Applicable).     If you need a refill on your cardiac medications before your next appointment, please call your pharmacy.

## 2018-08-14 NOTE — Progress Notes (Signed)
Cardiology Office Note:    Date:  08/14/2018   ID:  Travis [name], DOB 1955-07-29, MRN [id number]  PCP:  Travis Minium, MD  Cardiologist:  No primary care provider on file.    Referring MD: Travis Minium, MD   Chief Complaint  Patient presents with  . Coronary Artery Disease  . Hypertension  . Hyperlipidemia  . Atrial Fibrillation    History of Present Illness:    Travis [name] is a 63 y.o. male with a history of ASCAD with MI in 1997 with PCI by Dr. Glade [name], HTN, dyslipidemia, chronic LE edema, PAF after surgery for panniculectomy and PVC's.  2D echo showed normal LVF with mild LVH and moderate pulmonary HTN with PASP 53mmHg in 2017.    He is here today for followup and is doing well.  He denies any chest pain or pressure, SOB, DOE, PND, orthopnea, dizziness, palpitations or syncope. He occasionally will have some trace edema. He is compliant with hiw meds and is tolerating meds with no SE.    Past Medical History:  Diagnosis Date  . Appendicitis   . Blood in stool   . Coronary artery disease 1997   s/p PCI by Dr. Glade [name]  . Diverticulosis of colon with hemorrhage 2009  . Dyslipidemia, goal LDL below 70   . Edema   . Encounter for long-term (current) use of other medications   . Essential hypertension, benign   . Family history of thyroid disease   . Frequent unifocal PVCs   . Morbid obesity ([place])   . Myalgia and myositis, unspecified   . Prostate cancer ([place]) 2012  . Pulmonary HTN ([place]) 08/14/2018   PASP 39mmHg by echo 2017  . Sleep apnea with use of continuous positive airway pressure (CPAP)   . Thrombocytopenia ([place]) 05/25/2014  . Type II or unspecified type diabetes mellitus without mention of complication, not stated as uncontrolled     Past Surgical History:  Procedure Laterality Date  . ANGIOPLASTY  1997   Dr. Glade [name]  . APPENDECTOMY      Current Medications: Current Meds  Medication Sig  . acetaminophen (TYLENOL) 325 MG tablet Take 650  mg by mouth every 6 (six) hours as needed.  [name] albuterol (PROVENTIL HFA;VENTOLIN HFA) 108 (90 Base) MCG/ACT inhaler Inhale 2 puffs into the lungs every 6 (six) hours as needed for wheezing or shortness of breath.  [name] amLODipine (NORVASC) 10 MG tablet TAKE 1 TABLET BY MOUTH DAILY.  [name] aspirin EC 81 MG tablet Take 81 mg by mouth daily.  . fenofibrate micronized (LOFIBRA) 200 MG capsule TAKE 1 CAPSULE BY MOUTH DAILY BEFORE BREAKFAST  . furosemide (LASIX) 20 MG tablet TAKE 1-2 TABLETS (20-40 MG TOTAL) BY MOUTH 2 (TWO) TIMES DAILY AS NEEDED FOR EDEMA (EDEMA).  . gabapentin (NEURONTIN) 300 MG capsule TAKE 1 CAPSULE BY MOUTH 3 TIMES DAILY  . JANUVIA 100 MG tablet TAKE 1 TABLET BY MOUTH DAILY.  [name] lisinopril (PRINIVIL,ZESTRIL) 40 MG tablet TAKE 1 TABLET BY MOUTH DAILY.  . meloxicam (MOBIC) 15 MG tablet TAKE 1 TABLET BY MOUTH DAILY.  . metFORMIN (GLUCOPHAGE) 500 MG tablet TAKE 2 TABLETS BY MOUTH 2 TIMES DAILY WITH A MEAL.  . metoprolol succinate (TOPROL-XL) 100 MG 24 hr tablet Take 1 tablet (100 mg total) by mouth daily. Please keep upcoming appt for future refills. Thank you  . promethazine-dextromethorphan (PROMETHAZINE-DM) 6.25-15 MG/5ML syrup Take 5 mLs by mouth 4 (four) times daily as needed.  . rosuvastatin (CRESTOR) 10  MG tablet Take 1 tablet (10 mg total) by mouth daily. Please keep upcoming appt for future refills. Thank you  . traZODone (DESYREL) 50 MG tablet TAKE 1/2 TO 1 TABLET BY MOUTH AT BEDTIME AS NEEDED FOR SLEEP  . TRUE METRIX BLOOD GLUCOSE TEST test strip   . TRUEPLUS LANCETS 30G MISC      Allergies:   Crestor [rosuvastatin] and Lipitor [atorvastatin]   Social History   Socioeconomic History  . Marital status: Married    Spouse name: Not on file  . Number of children: Not on file  . Years of education: Not on file  . Highest education level: Not on file  Occupational History  . Occupation: Scientific laboratory technician: [employer]    Comment: mod/heavy lifting  Social Needs  . Financial  resource strain: Not on file  . Food insecurity    Worry: Not on file    Inability: Not on file  . Transportation needs    Medical: Not on file    Non-medical: Not on file  Tobacco Use  . Smoking status: Never Smoker  . Smokeless tobacco: Never Used  Substance and Sexual Activity  . Alcohol use: No    Alcohol/week: 0.0 standard drinks  . Drug use: No  . Sexual activity: Not on file  Lifestyle  . Physical activity    Days per week: Not on file    Minutes per session: Not on file  . Stress: Not on file  Relationships  . Social Herbalist on phone: Not on file    Gets together: Not on file    Attends religious service: Not on file    Active member of club or organization: Not on file    Attends meetings of clubs or organizations: Not on file    Relationship status: Not on file  Other Topics Concern  . Not on file  Social History Narrative  . Not on file     Family History: The patient's family history includes CVA in his father; Diabetes in his mother; Hypertension in his father and sister; Hypothyroidism in his sister.  ROS:   Please see the history of present illness.    ROS  All other systems reviewed and negative.   EKGs/Labs/Other Studies Reviewed:    The following studies were reviewed today: none  EKG:  EKG is  ordered today.  The ekg ordered today demonstrates NSR with occasional PVCs and PACs  Recent Labs: 05/02/2018: BUN 21; Creatinine, Ser 0.92; Hemoglobin 13.3; Platelets 178.0; Potassium 4.4; Sodium 141; TSH 1.24 05/16/2018: ALT 24   Recent Lipid Panel    Component Value Date/Time   CHOL 135 05/02/2018 1303   CHOL 131 02/08/2016 0841   TRIG 194.0 (H) 05/02/2018 1303   HDL 31.60 (L) 05/02/2018 1303   HDL 32 (L) 02/08/2016 0841   CHOLHDL 4 05/02/2018 1303   VLDL 38.8 05/02/2018 1303   LDLCALC 65 05/02/2018 1303   LDLCALC 71 02/08/2016 0841   LDLDIRECT 79.0 09/04/2017 1416    Physical Exam:    VS:  BP (!) 148/76   Pulse 70   Ht 5'  8" (1.727 m)   Wt (!) 346 lb (156.9 kg)   BMI 52.61 kg/m     Wt Readings from Last 3 Encounters:  08/14/18 (!) 346 lb (156.9 kg)  05/01/18 (!) 333 lb (151 kg)  03/14/18 (!) 351 lb 6.4 oz (159.4 kg)     GEN:  Well nourished, well developed  in no acute distress HEENT: Normal NECK: No JVD; No carotid bruits LYMPHATICS: No lymphadenopathy CARDIAC: RRR, no murmurs, rubs, gallops RESPIRATORY:  Clear to auscultation without rales, wheezing or rhonchi  ABDOMEN: Soft, non-tender, non-distended MUSCULOSKELETAL: Trace LE edema; No deformity  SKIN: Warm and dry NEUROLOGIC:  Alert and oriented x 3 PSYCHIATRIC:  Normal affect   ASSESSMENT:    1. Coronary artery disease involving native coronary artery of native heart without angina pectoris   2. Pulmonary HTN ([place])   3. Frequent unifocal PVCs   4. Essential hypertension   5. Obstructive sleep apnea   6. Dyslipidemia   7. Morbid obesity ([place])    PLAN:    In order of problems listed above:  1.  ASCAD -MI in 1997 with PCI by Dr. Glade [name] -he has not had any anginal sx -continue ASA, BB and statin.   2.  Pulmonary HTN -moderate by echo in 2017 and likely related to morbid obesity and OSA -will repeat echo to make sure this has not changed  3.  PVCs -EKG shows occasional PVCs -he is fairly asymptomatic. -continue BB  4.  Hypertension -BP is controlled -continue on Toprol XL 100mg  daily, Lisinopril 40mg  daily and amlodipine 10mg  daily. -creatinine was normal at 0.92 K+ 4.4 in April 2020  5.  OSA - The patient is tolerating PAP therapy well without any problems. I will get a download from his DME. The patient has been using and benefiting from PAP use and will continue to benefit from therapy.   6.  Hyperlipidemia  -his LDL goal is < 70 -last LDL was at goal a 65 in April 2020. -continue on rosuvastatin 10mg  daily and fenofibrate 200mg  daily  7.  Morbid Obesity -I have encouraged him to get into a routine exercise program  and cut back on carbs and portions.   Medication Adjustments/Labs and Tests Ordered: Current medicines are reviewed at length with the patient today.  Concerns regarding medicines are outlined above.  Orders Placed This Encounter  Procedures  . EKG 12-Lead  . ECHOCARDIOGRAM COMPLETE   No orders of the defined types were placed in this encounter.   Signed, Fransico Him, MD  08/14/2018 3:47 PM    [place]

## 2018-08-20 ENCOUNTER — Other Ambulatory Visit: Payer: Self-pay | Admitting: Family Medicine

## 2018-08-20 ENCOUNTER — Ambulatory Visit: Payer: Self-pay | Admitting: Family Medicine

## 2018-08-20 MED FILL — AMLODIPINE BESYLATE 10 MG T: 10 | 90 days supply | Qty: 90 | Fill #0

## 2018-08-20 MED FILL — FENOFIBRATE 200 MG CAPSULE: 200 | 90 days supply | Qty: 90 | Fill #0

## 2018-08-20 MED FILL — MELOXICAM 15 MG TABLET: 15 | 30 days supply | Qty: 30 | Fill #0

## 2018-08-20 MED FILL — traZODone HCL 50 MG TABS: 50 | 90 days supply | Qty: 90 | Fill #0

## 2018-08-20 MED FILL — GABAPENTIN 300 MG CAPSULE: 300 | 30 days supply | Qty: 90 | Fill #0

## 2018-08-25 ENCOUNTER — Ambulatory Visit (HOSPITAL_COMMUNITY): Payer: 59 | Attending: Cardiology

## 2018-08-25 ENCOUNTER — Other Ambulatory Visit: Payer: Self-pay

## 2018-08-25 DIAGNOSIS — I272 Pulmonary hypertension, unspecified: Secondary | ICD-10-CM | POA: Insufficient documentation

## 2018-08-25 MED ORDER — PERFLUTREN LIPID MICROSPHERE
1.0000 mL | INTRAVENOUS | Status: AC | PRN
  Administered 2018-08-25: 2 mL via INTRAVENOUS

## 2018-08-26 ENCOUNTER — Telehealth: Payer: Self-pay | Admitting: Nurse Practitioner

## 2018-08-26 DIAGNOSIS — G4733 Obstructive sleep apnea (adult) (pediatric): Secondary | ICD-10-CM

## 2018-08-26 DIAGNOSIS — I270 Primary pulmonary hypertension: Secondary | ICD-10-CM

## 2018-08-26 NOTE — Telephone Encounter (Signed)
Results and plan of care reviewed with patient who verbalized understanding and agreement. Patient agrees to plan to proceed with ordering tests and understands that prior to PFT, patient will need a Covid screening and then will have to isolate until the time of the PFT.  I advised patient that tests will be ordered and that we will be back in touch regarding the scheduling of tests. He verbalized understanding and agreement and thanked me for the call.

## 2018-08-26 NOTE — Telephone Encounter (Signed)
-----   Message from Sueanne Margarita, MD sent at 08/25/2018  6:23 PM EDT ----- Normal LVF with increased stiffness of heart muscle, mildly dilated RV with moderately increased BP in the lungs likely related to obesity.  Please get a VQ scan to rule out chronic PE and PFTs to assess for COPD

## 2018-08-28 NOTE — Telephone Encounter (Signed)
CXR added for VQ scan.

## 2018-09-01 ENCOUNTER — Other Ambulatory Visit: Payer: Self-pay | Admitting: Internal Medicine

## 2018-09-01 NOTE — Progress Notes (Signed)
[name] [address], [place] [address] Phone: [phone] Subjective:   Travis [name] am serving as a Education administrator for Dr. Hulan Saas.   CC: knee pain follow up   IPJ:[id number]   07/15/2018 Viscosupplementation given today.  I do believe patient will do well with this.  Encourage weight loss and control of the diabetes.  Discussed icing regimen, over-the-counter medications.  Discussed over-the-counter insoles for shoes that can help with alignment as well.  Follow-up again in 6 to 8 weeks  09/02/2018 Travis [name] is a 63 y.o. male coming in with complaint of bilateral knee pain.  Given viscosupplementation July 15, 2018.  Patient is continue conservative therapy.  Patient states the knees are not too bad. Stiffness. This past Sunday his left knee was very painful due to a twisting motion. States the knee is unstable. Patellar tendon pain.       Past Medical History:  Diagnosis Date  . Appendicitis   . Blood in stool   . Coronary artery disease 1997   s/p PCI by Dr. Glade Lloyd  . Diverticulosis of colon with hemorrhage 2009  . Dyslipidemia, goal LDL below 70   . Edema   . Encounter for long-term (current) use of other medications   . Essential hypertension, benign   . Family history of thyroid disease   . Frequent unifocal PVCs   . Morbid obesity ([place])   . Myalgia and myositis, unspecified   . Prostate cancer ([place]) 2012  . Pulmonary HTN ([place]) 08/14/2018   PASP 7mmHg by echo 2017  . Sleep apnea with use of continuous positive airway pressure (CPAP)   . Thrombocytopenia ([place]) 05/25/2014  . Type II or unspecified type diabetes mellitus without mention of complication, not stated as uncontrolled    Past Surgical History:  Procedure Laterality Date  . ANGIOPLASTY  1997   Dr. Glade Lloyd  . APPENDECTOMY     Social History   Socioeconomic History  . Marital status: Married    Spouse name: Not on file  . Number of children: Not on file   . Years of education: Not on file  . Highest education level: Not on file  Occupational History  . 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 Financial resource strain: Not on file  . Food insecurity    Worry: Not on file    Inability: Not on file  . Transportation needs    Medical: Not on file    Non-medical: Not on file  Tobacco Use  . Smoking status: Never Smoker  . Smokeless tobacco: Never Used  Substance and Sexual Activity  . Alcohol use: No    Alcohol/week: 0.0 standard drinks  . Drug use: No  . Sexual activity: Not on file  Lifestyle  . Physical activity    Days per week: Not on file    Minutes per session: Not on file  . Stress: Not on file  Relationships  . Social Herbalist on phone: Not on file    Gets together: Not on file    Attends religious service: Not on file    Active member of club or organization: Not on file    Attends meetings of clubs or organizations: Not on file    Relationship status: Not on file  Other Topics Concern  . Not on file  Social History Narrative  . Not on file  Allergies  Allergen Reactions  . Crestor [Rosuvastatin]     Muscle Pain   . Lipitor [Atorvastatin] Other (See Comments)    Arm weakness    Family History  Problem Relation Age of Onset  . CVA Father   . Hypertension Father   . Diabetes Mother        DM  . Hypothyroidism Sister   . Hypertension Sister     Current Outpatient Medications (Endocrine & Metabolic):  [name]  JANUVIA 100 MG tablet, TAKE 1 TABLET BY MOUTH DAILY. .  metFORMIN (GLUCOPHAGE) 500 MG tablet, TAKE 2 TABLETS BY MOUTH 2 TIMES DAILY WITH A MEAL.  Current Outpatient Medications (Cardiovascular):  .  amLODipine (NORVASC) 10 MG tablet, TAKE 1 TABLET BY MOUTH DAILY. .  fenofibrate micronized (LOFIBRA) 200 MG capsule, TAKE 1 CAPSULE BY MOUTH DAILY BEFORE BREAKFAST .  furosemide (LASIX) 20 MG tablet, TAKE 1-2 TABLETS (20-40 MG TOTAL) BY  MOUTH 2 (TWO) TIMES DAILY AS NEEDED FOR EDEMA (EDEMA). [name]  lisinopril (PRINIVIL,ZESTRIL) 40 MG tablet, TAKE 1 TABLET BY MOUTH DAILY. .  metoprolol succinate (TOPROL-XL) 100 MG 24 hr tablet, Take 1 tablet (100 mg total) by mouth daily. Please keep upcoming appt for future refills. Thank you .  rosuvastatin (CRESTOR) 10 MG tablet, Take 1 tablet (10 mg total) by mouth daily. Please keep upcoming appt for future refills. Thank you  Current Outpatient Medications (Respiratory):  .  albuterol (PROVENTIL HFA;VENTOLIN HFA) 108 (90 Base) MCG/ACT inhaler, Inhale 2 puffs into the lungs every 6 (six) hours as needed for wheezing or shortness of breath. .  promethazine-dextromethorphan (PROMETHAZINE-DM) 6.25-15 MG/5ML syrup, Take 5 mLs by mouth 4 (four) times daily as needed.  Current Outpatient Medications (Analgesics):  .  acetaminophen (TYLENOL) 325 MG tablet, Take 650 mg by mouth every 6 (six) hours as needed. [name]  aspirin EC 81 MG tablet, Take 81 mg by mouth daily. .  meloxicam (MOBIC) 15 MG tablet, TAKE 1 TABLET BY MOUTH DAILY.   Current Outpatient Medications (Other):  .  gabapentin (NEURONTIN) 300 MG capsule, TAKE 1 CAPSULE BY MOUTH 3 TIMES DAILY .  traZODone (DESYREL) 50 MG tablet, TAKE 1/2 TO 1 TABLET BY MOUTH AT BEDTIME AS NEEDED FOR SLEEP .  TRUE METRIX BLOOD GLUCOSE TEST test strip,  .  TRUEPLUS LANCETS 30G MISC,     Past medical history, social, surgical and family history all reviewed in electronic medical record.  No pertanent information unless stated regarding to the chief complaint.   Review of Systems:  No headache, visual changes, nausea, vomiting, diarrhea, constipation, dizziness, abdominal pain, skin rash, fevers, chills, night sweats, weight loss, swollen lymph nodes, body aches, joint swelling, muscle aches, chest pain, shortness of breath, mood changes.   Objective  Blood pressure (!) 148/72, pulse 73, height 5\' 8"  (1.727 m), weight (!) 346 lb (156.9 kg), SpO2 98 %.    General: No apparent distress alert and oriented x3 mood and affect normal, dressed appropriately.  Morbidly obese HEENT: Pupils equal, extraocular movements intact  Respiratory: Patient's speak in full sentences and does not appear short of breath  Cardiovascular: Trace lower extremity edema, non tender, no erythema  Skin: Warm dry intact with no signs of infection or rash on extremities or on axial skeleton.  Abdomen: Soft nontender  Neuro: Cranial nerves II through XII are intact, neurovascularly intact in all extremities with 2+ DTRs and 2+ pulses.  Lymph: No lymphadenopathy of posterior or anterior cervical chain or axillae bilaterally.  Gait antalgic walking  with the aid of a walker MSK:  tender with mild limited range of motion and good stability and symmetric strength and tone of shoulders, elbows, wrist, hip, and ankles bilaterally.  More secondary to patient's body habitus Knee: Bilateral valgus deformity noted. Large thigh to calf ratio.  Tender to palpation over medial and PF joint line.  ROM full in flexion and extension and lower leg rotation. instability with valgus force.  painful patellar compression. Patellar glide with moderate crepitus. Patellar and quadriceps tendons unremarkable. Hamstring and quadriceps strength is normal.   After informed written and verbal consent, patient was seated on exam table. Right knee was prepped with alcohol swab and utilizing anterolateral approach, patient's right knee space was injected with 4:1  marcaine 0.5%: Kenalog 40mg /dL. Patient tolerated the procedure well without immediate complications.  After informed written and verbal consent, patient was seated on exam table. Left knee was prepped with alcohol swab and utilizing anterolateral approach, patient's left knee space was injected with 4:1  marcaine 0.5%: Kenalog 40mg /dL. Patient tolerated the procedure well without immediate complications.   Impression and Recommendations:      This case required medical decision making of moderate complexity. The above documentation has been reviewed and is accurate and complete Lyndal Pulley, DO       Note: This dictation was prepared with Dragon dictation along with smaller phrase technology. Any transcriptional errors that result from this process are unintentional.

## 2018-09-02 ENCOUNTER — Ambulatory Visit (INDEPENDENT_AMBULATORY_CARE_PROVIDER_SITE_OTHER): Payer: 59 | Admitting: Family Medicine

## 2018-09-02 ENCOUNTER — Encounter: Payer: Self-pay | Admitting: Family Medicine

## 2018-09-02 ENCOUNTER — Other Ambulatory Visit: Payer: Self-pay

## 2018-09-02 DIAGNOSIS — M170 Bilateral primary osteoarthritis of knee: Secondary | ICD-10-CM | POA: Insufficient documentation

## 2018-09-02 NOTE — Assessment & Plan Note (Signed)
Bilateral injections given today.  Tolerated the procedure well.  Discussed the possibility of need for other potential treatment options.  Follow-up again in 4 to 8 weeks.

## 2018-09-02 NOTE — Patient Instructions (Signed)
Good to see you Follow up in 6-8 weeks  

## 2018-09-03 ENCOUNTER — Other Ambulatory Visit: Payer: Self-pay | Admitting: Cardiology

## 2018-09-03 ENCOUNTER — Other Ambulatory Visit: Payer: Self-pay

## 2018-09-03 ENCOUNTER — Ambulatory Visit (HOSPITAL_COMMUNITY)
Admission: RE | Admit: 2018-09-03 | Discharge: 2018-09-03 | Disposition: A | Discharge status: Home / Self Care | Payer: 59 | Source: Ambulatory Visit | Attending: Cardiology | Admitting: Cardiology

## 2018-09-03 DIAGNOSIS — I270 Primary pulmonary hypertension: Secondary | ICD-10-CM

## 2018-09-03 DIAGNOSIS — G4733 Obstructive sleep apnea (adult) (pediatric): Secondary | ICD-10-CM

## 2018-09-03 DIAGNOSIS — R0602 Shortness of breath: Secondary | ICD-10-CM | POA: Diagnosis not present

## 2018-09-03 MED ORDER — TECHNETIUM TO 99M ALBUMIN AGGREGATED
1.5000 | Freq: Once | INTRAVENOUS | Status: AC | PRN
  Administered 2018-09-03: 1.5 via INTRAVENOUS

## 2018-09-04 ENCOUNTER — Telehealth: Payer: Self-pay

## 2018-09-04 ENCOUNTER — Other Ambulatory Visit (HOSPITAL_COMMUNITY)
Admission: RE | Admit: 2018-09-04 | Discharge: 2018-09-04 | Disposition: A | Discharge status: Home / Self Care | Payer: 59 | Source: Ambulatory Visit | Attending: Internal Medicine | Admitting: Internal Medicine

## 2018-09-04 DIAGNOSIS — Z01812 Encounter for preprocedural laboratory examination: Secondary | ICD-10-CM | POA: Diagnosis not present

## 2018-09-04 DIAGNOSIS — Z20828 Contact with and (suspected) exposure to other viral communicable diseases: Secondary | ICD-10-CM | POA: Insufficient documentation

## 2018-09-04 LAB — SARS CORONAVIRUS 2 (TAT 6-24 HRS): SARS Coronavirus 2: NEGATIVE

## 2018-09-04 NOTE — Telephone Encounter (Signed)
The patient has been notified of the result and verbalized understanding.  All questions (if any) were answered. Wilma Flavin, RN 09/04/2018 8:32 AM

## 2018-09-05 ENCOUNTER — Ambulatory Visit: Payer: 59 | Admitting: Family Medicine

## 2018-09-08 ENCOUNTER — Other Ambulatory Visit: Payer: Self-pay

## 2018-09-08 ENCOUNTER — Ambulatory Visit (INDEPENDENT_AMBULATORY_CARE_PROVIDER_SITE_OTHER): Payer: 59 | Admitting: Internal Medicine

## 2018-09-08 DIAGNOSIS — G4733 Obstructive sleep apnea (adult) (pediatric): Secondary | ICD-10-CM

## 2018-09-08 DIAGNOSIS — I270 Primary pulmonary hypertension: Secondary | ICD-10-CM

## 2018-09-08 LAB — PULMONARY FUNCTION TEST
DL/VA % pred: 121 %
DL/VA: 5.13 ml/min/mmHg/L
DLCO unc % pred: 104 %
DLCO unc: 27.11 ml/min/mmHg
FEF 25-75 Post: 4.39 L/sec
FEF 25-75 Pre: 3.38 L/sec
FEF2575-%Change-Post: 29 %
FEF2575-%Pred-Post: 160 %
FEF2575-%Pred-Pre: 123 %
FEV1-%Change-Post: 6 %
FEV1-%Pred-Post: 97 %
FEV1-%Pred-Pre: 91 %
FEV1-Post: 3.26 L
FEV1-Pre: 3.06 L
FEV1FVC-%Change-Post: 7 %
FEV1FVC-%Pred-Pre: 109 %
FEV6-%Change-Post: 0 %
FEV6-%Pred-Post: 87 %
FEV6-%Pred-Pre: 87 %
FEV6-Post: 3.69 L
FEV6-Pre: 3.73 L
FEV6FVC-%Pred-Post: 105 %
FEV6FVC-%Pred-Pre: 105 %
FVC-%Change-Post: 0 %
FVC-%Pred-Post: 82 %
FVC-%Pred-Pre: 83 %
FVC-Post: 3.69 L
FVC-Pre: 3.73 L
Post FEV1/FVC ratio: 88 %
Post FEV6/FVC ratio: 100 %
Pre FEV1/FVC ratio: 82 %
Pre FEV6/FVC Ratio: 100 %
RV % pred: 105 %
RV: 2.32 L
TLC % pred: 89 %
TLC: 6.01 L

## 2018-09-08 NOTE — Progress Notes (Signed)
Full PFT performed today. °

## 2018-09-09 NOTE — Progress Notes (Addendum)
This encounter was created in error - please disregard. Accidentally opened ov

## 2018-09-10 ENCOUNTER — Ambulatory Visit (INDEPENDENT_AMBULATORY_CARE_PROVIDER_SITE_OTHER): Payer: 59 | Admitting: Family Medicine

## 2018-09-10 ENCOUNTER — Encounter: Payer: Self-pay | Admitting: Family Medicine

## 2018-09-10 ENCOUNTER — Other Ambulatory Visit: Payer: Self-pay

## 2018-09-10 VITALS — BP 131/80 | HR 76 | Temp 97.9°F | Resp 16 | Ht 68.0 in | Wt 337.4 lb

## 2018-09-10 DIAGNOSIS — Z1211 Encounter for screening for malignant neoplasm of colon: Secondary | ICD-10-CM

## 2018-09-10 DIAGNOSIS — E119 Type 2 diabetes mellitus without complications: Secondary | ICD-10-CM | POA: Diagnosis not present

## 2018-09-10 LAB — BASIC METABOLIC PANEL
BUN: 21 mg/dL (ref 6–23)
CO2: 26 mEq/L (ref 19–32)
Calcium: 9.4 mg/dL (ref 8.4–10.5)
Chloride: 103 mEq/L (ref 96–112)
Creatinine, Ser: 0.97 mg/dL (ref 0.40–1.50)
GFR: 78.36 mL/min (ref >60.00)
Glucose, Bld: 89 mg/dL (ref 70–99)
Potassium: 4.4 mEq/L (ref 3.5–5.1)
Sodium: 140 mEq/L (ref 135–145)

## 2018-09-10 LAB — HEMOGLOBIN A1C: Hgb A1c MFr Bld: 6.2 % (ref 4.6–6.5)

## 2018-09-10 NOTE — Patient Instructions (Addendum)
Schedule your complete physical in 3-4 months We'll notify you of your lab results and make any changes if needed We'll call you with your GI appt Continue to work on healthy diet and regular exercise- you can do it! Call with any questions or concerns Stay Safe!!!

## 2018-09-10 NOTE — Assessment & Plan Note (Signed)
Chronic problem.  Hx of adequate control.  Applauded pt's weight loss.  UTD on eye exam.  Foot exam done today.  On ACE for renal protection.  Check labs.  Adjust meds prn

## 2018-09-10 NOTE — Progress Notes (Signed)
   Subjective:    Patient ID: Travis [name], male    DOB: January 22, 1955, 63 y.o.   MRN: [id number]  HPI DM- chronic problem, on Metformin 1000mg  BID, Januvia 100mg  daily.  On ACE for renal protection.  UTD on eye exam.  Due for foot exam.  No CP, SOB, HAs, visual changes, edema.  Denies abd pain, N/V.  Denies symptomatic lows.  No numbness/tingling of hands/feed.  Obesity- pt is down 9 lbs from last visit.  BMI 51.3  Pt's new job is much more active.  Colon cancer screen- overdue for colonoscopy, pt saw Dr Oletta Lamas previously.   Review of Systems For ROS see HPI     Objective:   Physical Exam Vitals signs reviewed.  Constitutional:      General: He is not in acute distress.    Appearance: He is well-developed. He is obese.  HENT:     Head: Normocephalic and atraumatic.  Eyes:     Conjunctiva/sclera: Conjunctivae normal.     Pupils: Pupils are equal, round, and reactive to light.  Neck:     Musculoskeletal: Normal range of motion and neck supple.     Thyroid: No thyromegaly.  Cardiovascular:     Rate and Rhythm: Normal rate and regular rhythm.     Heart sounds: Normal heart sounds. No murmur.  Pulmonary:     Effort: Pulmonary effort is normal. No respiratory distress.     Breath sounds: Normal breath sounds.  Abdominal:     General: Bowel sounds are normal. There is no distension.     Palpations: Abdomen is soft.     Tenderness: There is no abdominal tenderness. There is no guarding.  Lymphadenopathy:     Cervical: No cervical adenopathy.  Skin:    General: Skin is warm and dry.  Neurological:     Mental Status: He is alert and oriented to person, place, and time.     Cranial Nerves: No cranial nerve deficit.  Psychiatric:        Behavior: Behavior normal.           Assessment & Plan:  Colon cancer screen- referral placed to GI for repeat colonoscopy.

## 2018-09-10 NOTE — Assessment & Plan Note (Signed)
Pt's BMI is now 51.3 after losing 9 lbs.  Stressed need for healthy diet and regular exercise.  Will follow.

## 2018-09-11 ENCOUNTER — Encounter: Payer: Self-pay | Admitting: General Practice

## 2018-09-23 ENCOUNTER — Other Ambulatory Visit: Payer: Self-pay | Admitting: Cardiology

## 2018-09-23 ENCOUNTER — Other Ambulatory Visit: Payer: Self-pay | Admitting: Family Medicine

## 2018-09-23 MED FILL — MELOXICAM 15 MG TABLET: 15 | 30 days supply | Qty: 30 | Fill #0

## 2018-09-23 MED FILL — JANUVIA 100 MG TABLET: 100 | 90 days supply | Qty: 90 | Fill #1

## 2018-09-23 MED FILL — ROSUVASTATIN CALCIUM 10 MG: 10 | 90 days supply | Qty: 90 | Fill #0

## 2018-09-23 MED FILL — GABAPENTIN 300 MG CAPSULE: 300 | 30 days supply | Qty: 90 | Fill #0

## 2018-09-29 ENCOUNTER — Other Ambulatory Visit: Payer: Self-pay | Admitting: Cardiology

## 2018-09-29 ENCOUNTER — Other Ambulatory Visit: Payer: Self-pay | Admitting: Family Medicine

## 2018-09-29 MED FILL — FUROSEMIDE 20 MG TABS: 20 | 15 days supply | Qty: 60 | Fill #0

## 2018-09-30 MED FILL — METOPROLOL SUCCINATE ER 100: 100 | 90 days supply | Qty: 90 | Fill #0

## 2018-10-06 ENCOUNTER — Other Ambulatory Visit: Payer: Self-pay | Admitting: Family Medicine

## 2018-10-06 MED FILL — metFORMIN HCL 500 MG TABS: 500 | 90 days supply | Qty: 360 | Fill #0

## 2018-10-14 ENCOUNTER — Ambulatory Visit (INDEPENDENT_AMBULATORY_CARE_PROVIDER_SITE_OTHER): Payer: 59 | Admitting: Family Medicine

## 2018-10-14 ENCOUNTER — Encounter: Payer: Self-pay | Admitting: Family Medicine

## 2018-10-14 ENCOUNTER — Other Ambulatory Visit: Payer: Self-pay

## 2018-10-14 DIAGNOSIS — M170 Bilateral primary osteoarthritis of knee: Secondary | ICD-10-CM

## 2018-10-14 NOTE — Progress Notes (Signed)
[name] [address], [place] [address] Phone: [phone] Subjective:   I Travis [name] am serving as a Education administrator for Dr. Hulan Saas.     CC: Bilateral knee pain  [id number]   09/02/2018 Bilateral injections given today.  Tolerated the procedure well.  Discussed the possibility of need for other potential treatment options.  Follow-up again in 4 to 8 weeks.  10/14/2018 Travis [name] is a 63 y.o. male coming in with complaint of bilateral knee pain. States the knees are stiff.  Patient is in no severe knee arthritis.  Last injection was 10 weeks ago.  Increasing discomfort again.  Discussed icing regimen which he has been doing intermittently.  Did have a slip in the hospital where he stopped doing some of the exercises.     Past Medical History:  Diagnosis Date  . Appendicitis   . Blood in stool   . Coronary artery disease 1997   s/p PCI by Dr. Glade Lloyd  . Diverticulosis of colon with hemorrhage 2009  . Dyslipidemia, goal LDL below 70   . Edema   . Encounter for long-term (current) use of other medications   . Essential hypertension, benign   . Family history of thyroid disease   . Frequent unifocal PVCs   . Morbid obesity ([place])   . Myalgia and myositis, unspecified   . Prostate cancer ([place]) 2012  . Pulmonary HTN ([place]) 08/14/2018   PASP 72mmHg by echo 2017  . Sleep apnea with use of continuous positive airway pressure (CPAP)   . Thrombocytopenia ([place]) 05/25/2014  . Type II or unspecified type diabetes mellitus without mention of complication, not stated as uncontrolled    Past Surgical History:  Procedure Laterality Date  . ANGIOPLASTY  1997   Dr. Glade Lloyd  . APPENDECTOMY     Social History   Socioeconomic History  . Marital status: Married    Spouse name: Not on file  . Number of children: Not on file  . Years of education: Not on file  . Highest education level: Not on file  Occupational History  . 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 Financial resource strain: Not on file  . Food insecurity    Worry: Not on file    Inability: Not on file  . Transportation needs    Medical: Not on file    Non-medical: Not on file  Tobacco Use  . Smoking status: Never Smoker  . Smokeless tobacco: Never Used  Substance and Sexual Activity  . Alcohol use: No    Alcohol/week: 0.0 standard drinks  . Drug use: No  . Sexual activity: Not on file  Lifestyle  . Physical activity    Days per week: Not on file    Minutes per session: Not on file  . Stress: Not on file  Relationships  . Social Herbalist on phone: Not on file    Gets together: Not on file    Attends religious service: Not on file    Active member of club or organization: Not on file    Attends meetings of clubs or organizations: Not on file    Relationship status: Not on file  Other Topics Concern  . Not on file  Social History Narrative  . Not on file   Allergies  Allergen Reactions  . Crestor [Rosuvastatin]     Muscle Pain   .  Lipitor [Atorvastatin] Other (See Comments)    Arm weakness    Family History  Problem Relation Age of Onset  . CVA Father   . Hypertension Father   . Diabetes Mother        DM  . Hypothyroidism Sister   . Hypertension Sister     Current Outpatient Medications (Endocrine & Metabolic):  [name]  JANUVIA 100 MG tablet, TAKE 1 TABLET BY MOUTH DAILY. .  metFORMIN (GLUCOPHAGE) 500 MG tablet, TAKE 2 TABLETS BY MOUTH 2 TIMES DAILY WITH A MEAL.  Current Outpatient Medications (Cardiovascular):  .  amLODipine (NORVASC) 10 MG tablet, TAKE 1 TABLET BY MOUTH DAILY. .  fenofibrate micronized (LOFIBRA) 200 MG capsule, TAKE 1 CAPSULE BY MOUTH DAILY BEFORE BREAKFAST .  furosemide (LASIX) 20 MG tablet, TAKE 1 TO 2 TABLETS BY MOUTH 2 TIMES DAILY AS NEEDED FOR EDEMA .  lisinopril (PRINIVIL,ZESTRIL) 40 MG tablet, TAKE 1 TABLET BY MOUTH DAILY. .  metoprolol  succinate (TOPROL-XL) 100 MG 24 hr tablet, TAKE 1 TABLET BY MOUTH DAILY. PLEASE KEEP UPCOMING APPT FOR FUTURE REFILLS. THANK YOU .  rosuvastatin (CRESTOR) 10 MG tablet, TAKE 1 TABLET BY MOUTH DAILY. PLEASE KEEP UPCOMING APPT FOR FUTURE REFILLS. THANK YOU  Current Outpatient Medications (Respiratory):  .  albuterol (PROVENTIL HFA;VENTOLIN HFA) 108 (90 Base) MCG/ACT inhaler, Inhale 2 puffs into the lungs every 6 (six) hours as needed for wheezing or shortness of breath.  Current Outpatient Medications (Analgesics):  .  acetaminophen (TYLENOL) 325 MG tablet, Take 650 mg by mouth every 6 (six) hours as needed. [name]  aspirin EC 81 MG tablet, Take 81 mg by mouth daily. .  meloxicam (MOBIC) 15 MG tablet, TAKE 1 TABLET BY MOUTH DAILY.   Current Outpatient Medications (Other):  .  gabapentin (NEURONTIN) 300 MG capsule, TAKE 1 CAPSULE BY MOUTH 3 TIMES DAILY .  traZODone (DESYREL) 50 MG tablet, TAKE 1/2 TO 1 TABLET BY MOUTH AT BEDTIME AS NEEDED FOR SLEEP .  TRUE METRIX BLOOD GLUCOSE TEST test strip,  .  TRUEPLUS LANCETS 30G MISC,     Past medical history, social, surgical and family history all reviewed in electronic medical record.  No pertanent information unless stated regarding to the chief complaint.   Review of Systems:  No headache, visual changes, nausea, vomiting, diarrhea, constipation, dizziness, abdominal pain, skin rash, fevers, chills, night sweats, weight loss, swollen lymph nodes, body aches, chest pain, shortness of breath, mood changes.  Positive muscle aches and joint swelling  Objective  Blood pressure 130/82, pulse 75, height 5\' 8"  (1.727 m), SpO2 95 %.    General: No apparent distress alert and oriented x3 mood and affect normal, dressed appropriately.  HEENT: Pupils equal, extraocular movements intact  Respiratory: Patient's speak in full sentences and does not appear short of breath  Cardiovascular: No lower extremity edema, non tender, no erythema  Skin: Warm dry intact  with no signs of infection or rash on extremities or on axial skeleton.  Abdomen: Soft nontender  Neuro: Cranial nerves II through XII are intact, neurovascularly intact in all extremities with 2+ DTRs and 2+ pulses.  Lymph: No lymphadenopathy of posterior or anterior cervical chain or axillae bilaterally.  Gait normal with good balance and coordination.  MSK:  Non tender with full range of motion and good stability and symmetric strength and tone of shoulders, elbows, wrist, hip, knee and ankles bilaterally.  Knee: Bilateral valgus deformity noted.  Abnormal thigh to calf ratio.  Tender to palpation over medial and  PF joint line.  ROM full in flexion and extension and lower leg rotation. instability with valgus force.  painful patellar compression. Patellar glide with moderate crepitus. Patellar and quadriceps tendons unremarkable. Hamstring and quadriceps strength is normal.  After informed written and verbal consent, patient was seated on exam table. Right knee was prepped with alcohol swab and utilizing anterolateral approach, patient's right knee space was injected with 4:1  marcaine 0.5%: Kenalog 40mg /dL. Patient tolerated the procedure well without immediate complications.  After informed written and verbal consent, patient was seated on exam table. Left knee was prepped with alcohol swab and utilizing anterolateral approach, patient's left knee space was injected with 4:1  marcaine 0.5%: Kenalog 40mg /dL. Patient tolerated the procedure well without immediate complications.   Impression and Recommendations:     This case required medical decision making of moderate complexity. The above documentation has been reviewed and is accurate and complete Lyndal Pulley, DO       Note: This dictation was prepared with Dragon dictation along with smaller phrase technology. Any transcriptional errors that result from this process are unintentional.

## 2018-10-14 NOTE — Patient Instructions (Signed)
See me again in 10 weeks Dr. Wonda Cerise

## 2018-10-14 NOTE — Assessment & Plan Note (Signed)
Bilateral injections given today.  Tolerated the procedure well.  We discussed the possibility of Vis[place] supplementation but patient seems to continue to respond well to the steroid injections.  Discussed icing regimen and home exercises, which activities to do which wants to avoid.  Increase activity as tolerated.

## 2018-10-16 DIAGNOSIS — Z1211 Encounter for screening for malignant neoplasm of colon: Secondary | ICD-10-CM | POA: Diagnosis not present

## 2018-10-16 DIAGNOSIS — I2510 Atherosclerotic heart disease of native coronary artery without angina pectoris: Secondary | ICD-10-CM | POA: Diagnosis not present

## 2018-10-16 DIAGNOSIS — G4730 Sleep apnea, unspecified: Secondary | ICD-10-CM | POA: Diagnosis not present

## 2018-10-16 DIAGNOSIS — E119 Type 2 diabetes mellitus without complications: Secondary | ICD-10-CM | POA: Diagnosis not present

## 2018-10-16 DIAGNOSIS — I10 Essential (primary) hypertension: Secondary | ICD-10-CM | POA: Diagnosis not present

## 2018-10-23 ENCOUNTER — Other Ambulatory Visit: Payer: Self-pay | Admitting: Family Medicine

## 2018-10-23 MED FILL — GABAPENTIN 300 MG CAPSULE: 300 | 30 days supply | Qty: 90 | Fill #0

## 2018-10-23 MED FILL — MELOXICAM 15 MG TABLET: 15 | 30 days supply | Qty: 30 | Fill #0

## 2018-10-27 ENCOUNTER — Telehealth: Payer: Self-pay | Admitting: Cardiology

## 2018-10-27 NOTE — Telephone Encounter (Signed)
I am fine with that 

## 2018-10-27 NOTE — Telephone Encounter (Signed)
° °  Provider change request Request NL provider, last saw Dr [name]. Please advise

## 2018-11-11 ENCOUNTER — Telehealth: Payer: Self-pay | Admitting: *Deleted

## 2018-11-11 DIAGNOSIS — Z006 Encounter for examination for normal comparison and control in clinical research program: Secondary | ICD-10-CM

## 2018-11-11 NOTE — Telephone Encounter (Signed)
GOULD EDUInformed Consent  Subject Name:Travis [name]  Subject met inclusion and exclusion criteria. The informed consent form, study requirements and expectations were reviewed with the subject and questions and concerns were addressed prior to the signing of the consent form. The subject verbalized understanding of the trial requirements. The subject agreed to participate in the [place] and gave verbal consent to participate in the [place] 11/07/2018. The informed consent was obtained prior to performance of any protocol-specific procedures for the subject. A copy of the signed informed consent was mailedto the subject and a copy was placed in the subject's medical record.  Oletta Cohn.

## 2018-11-24 ENCOUNTER — Other Ambulatory Visit: Payer: Self-pay | Admitting: Family Medicine

## 2018-11-24 ENCOUNTER — Ambulatory Visit: Payer: 59 | Admitting: Cardiovascular Disease

## 2018-11-24 MED FILL — GABAPENTIN 300 MG CAPSULE: 300 | 30 days supply | Qty: 90 | Fill #0

## 2018-11-24 MED FILL — MELOXICAM 15 MG TABLET: 15 | 30 days supply | Qty: 30 | Fill #0

## 2018-11-24 MED FILL — AMLODIPINE BESYLATE 10 MG T: 10 | 90 days supply | Qty: 90 | Fill #0

## 2018-11-24 MED FILL — FENOFIBRATE 200 MG CAPSULE: 200 | 90 days supply | Qty: 90 | Fill #0

## 2018-11-24 MED FILL — LISINOPRIL 40 MG TABLET: 40 | 90 days supply | Qty: 90 | Fill #0

## 2018-11-24 MED FILL — traZODone HCL 50 MG TABS: 50 | 90 days supply | Qty: 90 | Fill #0

## 2018-11-24 MED FILL — FUROSEMIDE 20 MG TABS: 20 | 15 days supply | Qty: 60 | Fill #1

## 2018-11-25 NOTE — Progress Notes (Signed)
Cardiology Office Note:   Date:  11/26/2018  NAME:  Travis [name]    MRN: [id number] DOB:  05-21-1955   PCP:  [name], MD  Cardiologist:  No primary care provider on file.   Referring MD: [name], MD   Chief Complaint  Patient presents with  . Coronary Artery Disease    History of Present Illness:   Travis [name] is a 63 y.o. male with a hx of CAD s/p POBA (1997), HTN, DM, OSA, PVCs who is being seen today for the evaluation of CAD.  He is a former patient of Dr. [name].  He reports he wanted a change of scenery.  He had a angioplasty in 1997.  He has done remarkably well since that time.  He has no symptoms of angina or shortness of breath with exertion.  A recent echocardiogram was unremarkable.  A1c 6.2, LDL 65.  His lab work is at goal.  Blood pressure is at goal as well.  He has had a history of PVCs for which he takes metoprolol succinate 100 mg.  He has had no further episodes of palpitations.  He reports he can get occasional episodes of this but not recently.  He is tolerating aspirin and statin well.  No major issues.  He is morbidly obese but is trying to lose weight.  He reports he is loss of 220 pounds recently.  No strong family history of cardiovascular disease.  Past Medical History: Past Medical History:  Diagnosis Date  . Appendicitis   . Blood in stool   . Coronary artery disease 1997   s/p PCI by Dr. Glade Lloyd  . Diverticulosis of colon with hemorrhage 2009  . Dyslipidemia, goal LDL below 70   . Edema   . Encounter for long-term (current) use of other medications   . Essential hypertension, benign   . Family history of thyroid disease   . Frequent unifocal PVCs   . Morbid obesity ([place])   . Myalgia and myositis, unspecified   . Prostate cancer ([place]) 2012  . Pulmonary HTN ([place]) 08/14/2018   PASP 67mmHg by echo 2017  . Sleep apnea with use of continuous positive airway pressure (CPAP)   . Thrombocytopenia ([place]) 05/25/2014  . Type II or  unspecified type diabetes mellitus without mention of complication, not stated as uncontrolled     Past Surgical History: Past Surgical History:  Procedure Laterality Date  . ANGIOPLASTY  1997   Dr. Glade Lloyd  . APPENDECTOMY      Current Medications: Current Meds  Medication Sig  . acetaminophen (TYLENOL) 325 MG tablet Take 650 mg by mouth every 6 (six) hours as needed.  [name] albuterol (PROVENTIL HFA;VENTOLIN HFA) 108 (90 Base) MCG/ACT inhaler Inhale 2 puffs into the lungs every 6 (six) hours as needed for wheezing or shortness of breath.  [name] amLODipine (NORVASC) 10 MG tablet TAKE 1 TABLET BY MOUTH DAILY.  [name] aspirin EC 81 MG tablet Take 81 mg by mouth daily.  . Cholecalciferol (VITAMIN D3 PO) Take 1 tablet by mouth daily.  . fenofibrate micronized (LOFIBRA) 200 MG capsule TAKE 1 CAPSULE BY MOUTH DAILY BEFORE BREAKFAST  . furosemide (LASIX) 20 MG tablet TAKE 1 TO 2 TABLETS BY MOUTH 2 TIMES DAILY AS NEEDED FOR EDEMA  . gabapentin (NEURONTIN) 300 MG capsule TAKE 1 CAPSULE BY MOUTH 3 TIMES DAILY  . JANUVIA 100 MG tablet TAKE 1 TABLET BY MOUTH DAILY.  [name] lisinopril (ZESTRIL) 40 MG tablet TAKE 1 TABLET  BY MOUTH DAILY.  . meloxicam (MOBIC) 15 MG tablet TAKE 1 TABLET BY MOUTH DAILY.  . metFORMIN (GLUCOPHAGE) 500 MG tablet TAKE 2 TABLETS BY MOUTH 2 TIMES DAILY WITH A MEAL.  . metoprolol succinate (TOPROL-XL) 100 MG 24 hr tablet TAKE 1 TABLET BY MOUTH DAILY. PLEASE KEEP UPCOMING APPT FOR FUTURE REFILLS. THANK YOU  . Misc Natural Products (TART CHERRY ADVANCED PO) Take 1 tablet by mouth daily.  . rosuvastatin (CRESTOR) 10 MG tablet TAKE 1 TABLET BY MOUTH DAILY. PLEASE KEEP UPCOMING APPT FOR FUTURE REFILLS. THANK YOU  . traZODone (DESYREL) 50 MG tablet TAKE 1/2 TO 1 TABLET BY MOUTH AT BEDTIME AS NEEDED FOR SLEEP  . TRUE METRIX BLOOD GLUCOSE TEST test strip   . TRUEPLUS LANCETS 30G MISC      Allergies:    Crestor [rosuvastatin] and Lipitor [atorvastatin]   Social History: Social History    Socioeconomic History  . Marital status: Married    Spouse name: Not on file  . Number of children: Not on file  . Years of education: Not on file  . Highest education level: Not on file  Occupational History  . 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 Financial resource strain: Not on file  . Food insecurity    Worry: Not on file    Inability: Not on file  . Transportation needs    Medical: Not on file    Non-medical: Not on file  Tobacco Use  . Smoking status: Never Smoker  . Smokeless tobacco: Never Used  Substance and Sexual Activity  . Alcohol use: No    Alcohol/week: 0.0 standard drinks  . Drug use: No  . Sexual activity: Not on file  Lifestyle  . Physical activity    Days per week: Not on file    Minutes per session: Not on file  . Stress: Not on file  Relationships  . Social Herbalist on phone: Not on file    Gets together: Not on file    Attends religious service: Not on file    Active member of club or organization: Not on file    Attends meetings of clubs or organizations: Not on file    Relationship status: Not on file  Other Topics Concern  . Not on file  Social History Narrative  . Not on file     Family History: The patient's family history includes CVA in his father; Diabetes in his mother; Hypertension in his father and sister; Hypothyroidism in his sister.  ROS:   All other ROS reviewed and negative. Pertinent positives noted in the HPI.     EKGs/Labs/Other Studies Reviewed:   The following studies were personally reviewed by me today:  EKG:  EKG is ordered today.  The ekg ordered today demonstrates normal sinus rhythm, heart rate 78, no acute ST-T changes, no evidence of prior infarction, and was personally reviewed by me.   TTE 08/25/2018  1. The left ventricle has normal systolic function with an ejection fraction of 60-65%. The cavity size was normal. Left ventricular diastolic  Doppler parameters are consistent with pseudonormalization. Elevated left ventricular end-diastolic pressure  No evidence of left ventricular regional wall motion abnormalities.  2. The right ventricle has normal systolic function. The cavity was mildly enlarged. There is no increase in right ventricular wall thickness. Right ventricular systolic pressure Cannot assess as IVC is not visualized. There is at least  moderate  pulmonary HTN as the TR pk gradient is 61mmHg..  3. Left atrial size was mildly dilated.  4. The aortic valve is tricuspid. Mild sclerosis of the aortic valve.  5. The aorta is normal in size and structure.  Recent Labs: 05/02/2018: Hemoglobin 13.3; Platelets 178.0; TSH 1.24 05/16/2018: ALT 24 09/10/2018: BUN 21; Creatinine, Ser 0.97; Potassium 4.4; Sodium 140   Recent Lipid Panel    Component Value Date/Time   CHOL 135 05/02/2018 1303   CHOL 131 02/08/2016 0841   TRIG 194.0 (H) 05/02/2018 1303   HDL 31.60 (L) 05/02/2018 1303   HDL 32 (L) 02/08/2016 0841   CHOLHDL 4 05/02/2018 1303   VLDL 38.8 05/02/2018 1303   LDLCALC 65 05/02/2018 1303   LDLCALC 71 02/08/2016 0841   LDLDIRECT 79.0 09/04/2017 1416    Physical Exam:   VS:  BP 124/62 (BP Location: Left Arm, Patient Position: Sitting, Cuff Size: Large)   Pulse 78   Temp (!) 97 F (36.1 C)   Ht 5\' 11"  (1.803 m)   Wt (!) 329 lb (149.2 kg)   BMI 45.89 kg/m    Wt Readings from Last 3 Encounters:  11/26/18 (!) 329 lb (149.2 kg)  09/10/18 (!) 337 lb 6 oz (153 kg)  09/02/18 (!) 346 lb (156.9 kg)    General: Obese male, no acute distress Heart: Atraumatic, normal size  Eyes: PEERLA, EOMI  Neck: Supple, no JVD Endocrine: No thryomegaly Cardiac: Normal S1, S2; RRR; no murmurs, rubs, or gallops Lungs: Clear to auscultation bilaterally, no wheezing, rhonchi or rales  Abd: Soft, nontender, no hepatomegaly  Ext: Trace edema Musculoskeletal: No deformities, BUE and BLE strength normal and equal Skin: Warm and dry, no  rashes   Neuro: Alert and oriented to person, place, time, and situation, CNII-XII grossly intact, no focal deficits  Psych: Normal mood and affect   ASSESSMENT:   Travis [name] is a 63 y.o. male who presents for the following: 1. Coronary artery disease involving native coronary artery of native heart without angina pectoris   2. Essential hypertension   3. Mixed hyperlipidemia   4. Obesity, morbid, BMI 40.0-49.9 ([place])   5. PVC (premature ventricular contraction)     PLAN:   1. Coronary artery disease involving native coronary artery of native heart without angina pectoris -He had balloon angioplasty 1997.  No further cardiac catheter symptoms since then. -Most recent echocardiogram with normal ejection fraction -Continue aspirin statin -LDL is at goal, 65  2. Essential hypertension -Blood pressure at goal today we will continue his lisinopril as well as metoprolol succinate as well as amlodipine  3. Mixed hyperlipidemia -LDL at goal we will continue his Crestor 10 mg  4. Obesity, morbid, BMI 40.0-49.9 ([place]) -He has lost weight and I congratulated him on that he will continue to do so  5. PVC (premature ventricular contraction) -Has had PVCs in the past, no further worrisome symptoms.  We will continue his metoprolol succinate   Disposition: Return in about 1 year (around 11/26/2019).  Medication Adjustments/Labs and Tests Ordered: Current medicines are reviewed at length with the patient today.  Concerns regarding medicines are outlined above.  Orders Placed This Encounter  Procedures  . EKG 12-Lead   No orders of the defined types were placed in this encounter.   Patient Instructions  Medication Instructions:  No cahnges  *If you need a refill on your cardiac medications before your next appointment, please call your pharmacy*  Lab Work: not needed  Testing/Procedures: Not needed  Follow-Up: At [facility], you and your health needs are our priority.   As part of our continuing mission to provide you with exceptional heart care, we have created designated Provider Care Teams.  These Care Teams include your primary Cardiologist (physician) and Advanced Practice Providers (APPs -  Physician Assistants and Nurse Practitioners) who all work together to provide you with the care you need, when you need it.  Your next appointment:   12 months  The format for your next appointment:   In Person  Provider:   Eleonore Chiquito, MD  Other Instructions     Signed, Addison Naegeli. Audie Box, [place]  [address] [place], [place] [address] [phone]  11/26/2018 3:34 PM

## 2018-11-26 ENCOUNTER — Other Ambulatory Visit: Payer: Self-pay

## 2018-11-26 ENCOUNTER — Encounter: Payer: Self-pay | Admitting: Cardiovascular Disease

## 2018-11-26 ENCOUNTER — Ambulatory Visit (INDEPENDENT_AMBULATORY_CARE_PROVIDER_SITE_OTHER): Payer: 59 | Admitting: Cardiovascular Disease

## 2018-11-26 VITALS — BP 124/62 | HR 78 | Temp 97.0°F | Ht 71.0 in | Wt 329.0 lb

## 2018-11-26 DIAGNOSIS — E782 Mixed hyperlipidemia: Secondary | ICD-10-CM

## 2018-11-26 DIAGNOSIS — I10 Essential (primary) hypertension: Secondary | ICD-10-CM

## 2018-11-26 DIAGNOSIS — I493 Ventricular premature depolarization: Secondary | ICD-10-CM | POA: Diagnosis not present

## 2018-11-26 DIAGNOSIS — I2510 Atherosclerotic heart disease of native coronary artery without angina pectoris: Secondary | ICD-10-CM

## 2018-11-26 DIAGNOSIS — Z1159 Encounter for screening for other viral diseases: Secondary | ICD-10-CM | POA: Diagnosis not present

## 2018-11-26 MED FILL — PEG-3350 SOLUTION: 420 | 1 days supply | Qty: 4000 | Fill #0

## 2018-11-26 NOTE — Patient Instructions (Signed)
Medication Instructions:  No cahnges  *If you need a refill on your cardiac medications before your next appointment, please call your pharmacy*  Lab Work: not needed   Testing/Procedures: Not needed  Follow-Up: At [facility], you and your health needs are our priority.  As part of our continuing mission to provide you with exceptional heart care, we have created designated Provider Care Teams.  These Care Teams include your primary Cardiologist (physician) and Advanced Practice Providers (APPs -  Physician Assistants and Nurse Practitioners) who all work together to provide you with the care you need, when you need it.  Your next appointment:   12 months  The format for your next appointment:   In Person  Provider:   Eleonore Chiquito, MD  Other Instructions

## 2018-12-01 DIAGNOSIS — Z1211 Encounter for screening for malignant neoplasm of colon: Secondary | ICD-10-CM | POA: Diagnosis not present

## 2018-12-01 DIAGNOSIS — K5140 Inflammatory polyps of colon without complications: Secondary | ICD-10-CM | POA: Diagnosis not present

## 2018-12-08 DIAGNOSIS — H40053 Ocular hypertension, bilateral: Secondary | ICD-10-CM | POA: Diagnosis not present

## 2018-12-08 DIAGNOSIS — H524 Presbyopia: Secondary | ICD-10-CM | POA: Diagnosis not present

## 2018-12-08 LAB — HM DIABETES EYE EXAM

## 2018-12-08 LAB — HM COLONOSCOPY

## 2018-12-09 ENCOUNTER — Encounter: Payer: Self-pay | Admitting: General Practice

## 2018-12-15 ENCOUNTER — Other Ambulatory Visit: Payer: Self-pay

## 2018-12-15 ENCOUNTER — Ambulatory Visit (INDEPENDENT_AMBULATORY_CARE_PROVIDER_SITE_OTHER): Payer: 59 | Admitting: Family Medicine

## 2018-12-15 ENCOUNTER — Encounter: Payer: Self-pay | Admitting: Family Medicine

## 2018-12-15 VITALS — BP 119/81 | HR 80 | Temp 98.2°F | Resp 16 | Ht 71.0 in | Wt 333.0 lb

## 2018-12-15 DIAGNOSIS — Z125 Encounter for screening for malignant neoplasm of prostate: Secondary | ICD-10-CM

## 2018-12-15 DIAGNOSIS — Z0000 Encounter for general adult medical examination without abnormal findings: Secondary | ICD-10-CM | POA: Diagnosis not present

## 2018-12-15 DIAGNOSIS — E119 Type 2 diabetes mellitus without complications: Secondary | ICD-10-CM

## 2018-12-15 LAB — PSA: PSA: 0.02 ng/mL — ABNORMAL LOW (ref 0.10–4.00)

## 2018-12-15 LAB — LIPID PANEL
Cholesterol: 121 mg/dL (ref 0–200)
HDL: 33.5 mg/dL — ABNORMAL LOW (ref >39.00)
LDL Cholesterol: 69 mg/dL (ref 0–99)
NonHDL: 87.58
Total CHOL/HDL Ratio: 4
Triglycerides: 92 mg/dL (ref 0.0–149.0)
VLDL: 18.4 mg/dL (ref 0.0–40.0)

## 2018-12-15 LAB — BASIC METABOLIC PANEL
BUN: 25 mg/dL — ABNORMAL HIGH (ref 6–23)
CO2: 27 mEq/L (ref 19–32)
Calcium: 9.3 mg/dL (ref 8.4–10.5)
Chloride: 103 mEq/L (ref 96–112)
Creatinine, Ser: 0.86 mg/dL (ref 0.40–1.50)
GFR: 89.96 mL/min (ref >60.00)
Glucose, Bld: 83 mg/dL (ref 70–99)
Potassium: 3.9 mEq/L (ref 3.5–5.1)
Sodium: 139 mEq/L (ref 135–145)

## 2018-12-15 LAB — CBC WITH DIFFERENTIAL/PLATELET
Basophils Absolute: 0 10*3/uL (ref 0.0–0.1)
Basophils Relative: 0.2 % (ref 0.0–3.0)
Eosinophils Absolute: 0 10*3/uL (ref 0.0–0.7)
Eosinophils Relative: 0 % (ref 0.0–5.0)
HCT: 37.7 % — ABNORMAL LOW (ref 39.0–52.0)
Hemoglobin: 13 g/dL (ref 13.0–17.0)
Lymphocytes Relative: 11.2 % — ABNORMAL LOW (ref 12.0–46.0)
Lymphs Abs: 1.2 10*3/uL (ref 0.7–4.0)
MCHC: 34.4 g/dL (ref 30.0–36.0)
MCV: 88.9 fl (ref 78.0–100.0)
Monocytes Absolute: 0.7 10*3/uL (ref 0.1–1.0)
Monocytes Relative: 6.9 % (ref 3.0–12.0)
Neutro Abs: 8.9 10*3/uL — ABNORMAL HIGH (ref 1.4–7.7)
Neutrophils Relative %: 81.7 % — ABNORMAL HIGH (ref 43.0–77.0)
Platelets: 177 10*3/uL (ref 150.0–400.0)
RBC: 4.24 Mil/uL (ref 4.22–5.81)
RDW: 14.9 % (ref 11.5–15.5)
WBC: 10.8 10*3/uL — ABNORMAL HIGH (ref 4.0–10.5)

## 2018-12-15 LAB — HEPATIC FUNCTION PANEL
ALT: 24 U/L (ref 0–53)
AST: 23 U/L (ref 0–37)
Albumin: 3.8 g/dL (ref 3.5–5.2)
Alkaline Phosphatase: 46 U/L (ref 39–117)
Bilirubin, Direct: 0.1 mg/dL (ref 0.0–0.3)
Total Bilirubin: 0.5 mg/dL (ref 0.2–1.2)
Total Protein: 6.3 g/dL (ref 6.0–8.3)

## 2018-12-15 LAB — HEMOGLOBIN A1C: Hgb A1c MFr Bld: 5.8 % (ref 4.6–6.5)

## 2018-12-15 LAB — TSH: TSH: 1.19 u[IU]/mL (ref 0.35–4.50)

## 2018-12-15 NOTE — Assessment & Plan Note (Signed)
Pt's PE unchanged from previous and WNL w/ exception of obesity.  UTD on colonoscopy and immunizations.  Check labs.  Anticipatory guidance provided.

## 2018-12-15 NOTE — Assessment & Plan Note (Signed)
Ongoing issue for pt.  Stressed need for healthy diet and regular exercise.  Check labs to risk stratify.  Will follow 

## 2018-12-15 NOTE — Progress Notes (Signed)
   Subjective:    Patient ID: Travis [name], male    DOB: [birth year], 63 y.o.   MRN: [id number]  HPI CPE- UTD on colonoscopy, immunizations.  UTD w/ urology.  Pt has gained 4 lbs since last visit.  UTD on foot exam, eye exam.  On ACE for renal protection.  No concerns today  Review of Systems Patient reports no vision/hearing changes, anorexia, fever ,adenopathy, persistant/recurrent hoarseness, swallowing issues, chest pain, palpitations, edema, persistant/recurrent cough, hemoptysis, dyspnea (rest,exertional, paroxysmal nocturnal), gastrointestinal  bleeding (melena, rectal bleeding), abdominal pain, excessive heart burn, GU symptoms (dysuria, hematuria, voiding/incontinence issues) syncope, focal weakness, memory loss, numbness & tingling, skin/hair/nail changes, depression, anxiety, abnormal bruising/bleeding, musculoskeletal symptoms/signs.   This visit occurred during the SARS-CoV-2 public health emergency.  Safety protocols were in place, including screening questions prior to the visit, additional usage of staff PPE, and extensive cleaning of exam room while observing appropriate contact time as indicated for disinfecting solutions.       Objective:   Physical Exam General Appearance:    Alert, cooperative, no distress, appears stated age, obese  Head:    Normocephalic, without obvious abnormality, atraumatic  Eyes:    PERRL, conjunctiva/corneas clear, EOM's intact, fundi    benign, both eyes       Ears:    Normal TM's and external ear canals, both ears  Nose:   Deferred due to COVID  Throat:   Neck:   Supple, symmetrical, trachea midline, no adenopathy;       thyroid:  No enlargement/tenderness/nodules  Back:     Symmetric, no curvature, ROM normal, no CVA tenderness  Lungs:     Clear to auscultation bilaterally, respirations unlabored  Chest wall:    No tenderness or deformity  Heart:    Regular rate and rhythm, S1 and S2 normal, no murmur, rub   or gallop  Abdomen:     Soft,  non-tender, bowel sounds active all four quadrants,    no masses, no organomegaly, obese  Genitalia:    Deferred to urology  Rectal:    Extremities:   Extremities normal, atraumatic, no cyanosis or edema  Pulses:   2+ and symmetric all extremities  Skin:   Skin color, texture, turgor normal, no rashes or lesions  Lymph nodes:   Cervical, supraclavicular, and axillary nodes normal  Neurologic:   CNII-XII intact. Normal strength, sensation and reflexes      throughout          Assessment & Plan:

## 2018-12-15 NOTE — Assessment & Plan Note (Signed)
Chronic problem.  Hx of adequate control.  Asymptomatic.  UTD on foot exam, eye exam, and on ACE for renal protection.  Check labs.  Adjust meds prn

## 2018-12-15 NOTE — Patient Instructions (Addendum)
Follow up in 3-4 months to recheck diabetes We'll notify you of your lab results and make any changes if needed Continue to work on healthy diet and regular exercise- you can do it! Call with any questions or concerns Stay Safe!  Stay Healthy! Merry Christmas!

## 2018-12-19 ENCOUNTER — Other Ambulatory Visit: Payer: Self-pay | Admitting: Family Medicine

## 2018-12-19 DIAGNOSIS — D72829 Elevated white blood cell count, unspecified: Secondary | ICD-10-CM

## 2018-12-23 ENCOUNTER — Other Ambulatory Visit: Payer: Self-pay

## 2018-12-23 ENCOUNTER — Ambulatory Visit (INDEPENDENT_AMBULATORY_CARE_PROVIDER_SITE_OTHER): Payer: 59 | Admitting: Family Medicine

## 2018-12-23 ENCOUNTER — Encounter: Payer: Self-pay | Admitting: Family Medicine

## 2018-12-23 DIAGNOSIS — M170 Bilateral primary osteoarthritis of knee: Secondary | ICD-10-CM

## 2018-12-23 NOTE — Progress Notes (Signed)
[name] [place], [place] [address] Phone: [phone] Subjective:   I Travis [name] am serving as a Education administrator for Dr. Hulan Saas.  This visit occurred during the SARS-CoV-2 public health emergency.  Safety protocols were in place, including screening questions prior to the visit, additional usage of staff PPE, and extensive cleaning of exam room while observing appropriate contact time as indicated for disinfecting solutions.     CC: Bilateral knee pain  [id number]   10/14/2018 Bilateral injections given today.  Tolerated the procedure well.  We discussed the possibility of Visco supplementation but patient seems to continue to respond well to the steroid injections.  Discussed icing regimen and home exercises, which activities to do which wants to avoid.  Increase activity as tolerated.  12/23/2018 Travis [name] is a 63 y.o. male coming in with complaint of bilateral knee pain. Left knee is more painful. Patient states that his knees have fluid on them. Known arthritic changes.  Last injections were greater than 3 months ago.  Severe pain starting again.  Using a walker on a more regular basis.  Patient has been doing relatively well but in synchrony increasing in pain and is starting to affect daily activities.    Past Medical History:  Diagnosis Date  . Appendicitis   . Blood in stool   . Coronary artery disease 1997   s/p PCI by Dr. Glade Lloyd  . Diverticulosis of colon with hemorrhage 2009  . Dyslipidemia, goal LDL below 70   . Edema   . Encounter for long-term (current) use of other medications   . Essential hypertension, benign   . Family history of thyroid disease   . Frequent unifocal PVCs   . Morbid obesity ([place])   . Myalgia and myositis, unspecified   . Prostate cancer ([facility]) 2012  . Pulmonary HTN ([place]) 08/14/2018   PASP 32mmHg by echo 2017  . Sleep apnea with use of continuous positive airway pressure (CPAP)   .  Thrombocytopenia ([place]) 05/25/2014  . Type II or unspecified type diabetes mellitus without mention of complication, not stated as uncontrolled    Past Surgical History:  Procedure Laterality Date  . ANGIOPLASTY  1997   Dr. Glade Lloyd  . APPENDECTOMY     Social History   Socioeconomic History  . Marital status: Married    Spouse name: Not on file  . Number of children: Not on file  . Years of education: Not on file  . Highest education level: Not on file  Occupational History  . 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 Financial resource strain: Not on file  . Food insecurity    Worry: Not on file    Inability: Not on file  . Transportation needs    Medical: Not on file    Non-medical: Not on file  Tobacco Use  . Smoking status: Never Smoker  . Smokeless tobacco: Never Used  Substance and Sexual Activity  . Alcohol use: No    Alcohol/week: 0.0 standard drinks  . Drug use: No  . Sexual activity: Not on file  Lifestyle  . Physical activity    Days per week: Not on file    Minutes per session: Not on file  . Stress: Not on file  Relationships  . Social Herbalist on phone: Not on file    Gets together: Not on file  Attends religious service: Not on file    Active member of club or organization: Not on file    Attends meetings of clubs or organizations: Not on file    Relationship status: Not on file  Other Topics Concern  . Not on file  Social History Narrative  . Not on file   Allergies  Allergen Reactions  . Crestor [Rosuvastatin]     Muscle Pain   . Lipitor [Atorvastatin] Other (See Comments)    Arm weakness    Family History  Problem Relation Age of Onset  . CVA Father   . Hypertension Father   . Diabetes Mother        DM  . Hypothyroidism Sister   . Hypertension Sister     Current Outpatient Medications (Endocrine & Metabolic):  [name]  JANUVIA 100 MG tablet, TAKE 1 TABLET BY MOUTH  DAILY. .  metFORMIN (GLUCOPHAGE) 500 MG tablet, TAKE 2 TABLETS BY MOUTH 2 TIMES DAILY WITH A MEAL.  Current Outpatient Medications (Cardiovascular):  .  amLODipine (NORVASC) 10 MG tablet, TAKE 1 TABLET BY MOUTH DAILY. .  fenofibrate micronized (LOFIBRA) 200 MG capsule, TAKE 1 CAPSULE BY MOUTH DAILY BEFORE BREAKFAST .  furosemide (LASIX) 20 MG tablet, TAKE 1 TO 2 TABLETS BY MOUTH 2 TIMES DAILY AS NEEDED FOR EDEMA .  lisinopril (ZESTRIL) 40 MG tablet, TAKE 1 TABLET BY MOUTH DAILY. .  metoprolol succinate (TOPROL-XL) 100 MG 24 hr tablet, TAKE 1 TABLET BY MOUTH DAILY. PLEASE KEEP UPCOMING APPT FOR FUTURE REFILLS. THANK YOU .  rosuvastatin (CRESTOR) 10 MG tablet, TAKE 1 TABLET BY MOUTH DAILY. PLEASE KEEP UPCOMING APPT FOR FUTURE REFILLS. THANK YOU  Current Outpatient Medications (Respiratory):  .  albuterol (PROVENTIL HFA;VENTOLIN HFA) 108 (90 Base) MCG/ACT inhaler, Inhale 2 puffs into the lungs every 6 (six) hours as needed for wheezing or shortness of breath.  Current Outpatient Medications (Analgesics):  .  acetaminophen (TYLENOL) 325 MG tablet, Take 650 mg by mouth every 6 (six) hours as needed. [name]  aspirin EC 81 MG tablet, Take 81 mg by mouth daily. .  meloxicam (MOBIC) 15 MG tablet, TAKE 1 TABLET BY MOUTH DAILY.   Current Outpatient Medications (Other):  [name]  Cholecalciferol (VITAMIN D3 PO), Take 1 tablet by mouth daily. [name]  gabapentin (NEURONTIN) 300 MG capsule, TAKE 1 CAPSULE BY MOUTH 3 TIMES DAILY .  Misc Natural Products (TART CHERRY ADVANCED PO), Take 1 tablet by mouth daily. .  traZODone (DESYREL) 50 MG tablet, TAKE 1/2 TO 1 TABLET BY MOUTH AT BEDTIME AS NEEDED FOR SLEEP .  TRUE METRIX BLOOD GLUCOSE TEST test strip,  .  TRUEPLUS LANCETS 30G MISC,     Past medical history, social, surgical and family history all reviewed in electronic medical record.  No pertanent information unless stated regarding to the chief complaint.   Review of Systems:  No headache, visual changes, nausea,  vomiting, diarrhea, constipation, dizziness, abdominal pain, skin rash, fevers, chills, night sweats, weight loss, swollen lymph nodes, body aches, joint swelling,, chest pain, shortness of breath, mood changes.  Positive muscle aches  Objective  Blood pressure (!) 154/82, pulse 74, height 5\' 11"  (1.803 m), weight (!) 340 lb (154.2 kg), SpO2 97 %.    General: No apparent distress alert and oriented x3 mood and affect normal, dressed appropriately.  Morbidly obese HEENT: Pupils equal, extraocular movements intact  Respiratory: Patient's speak in full sentences and does not appear short of breath  Cardiovascular: 1+ lower extremity edema, non tender, no  erythema  Skin: Warm dry intact with no signs of infection or rash on extremities or on axial skeleton.  Abdomen: Soft nontender  Neuro: Cranial nerves II through XII are intact, neurovascularly intact in all extremities with 2+ DTRs and 2+ pulses.  Lymph: No lymphadenopathy of posterior or anterior cervical chain or axillae bilaterally.  Gait antalgic MSK:  tender with limited range of motion and good stability and symmetric strength and tone of shoulders, elbows, wrist, hip and ankles bilaterally.  Knee: Bilateral valgus deformity noted. Large thigh to calf ratio.  Difficult to assess secondary to patient's body habitus Tender to palpation over medial and PF joint line.  ROM decreased lacking last 5 degrees of extension was 5 degrees of flexion bilaterally instability with valgus force.  painful patellar compression. Patellar glide with moderate crepitus. Patellar and quadriceps tendons unremarkable. Hamstring and quadriceps strength is normal.  After informed written and verbal consent, patient was seated on exam table. Right knee was prepped with alcohol swab and utilizing anterolateral approach, patient's right knee space was injected with 4:1  marcaine 0.5%: Kenalog 40mg /dL. Patient tolerated the procedure well without immediate  complications.  After informed written and verbal consent, patient was seated on exam table. Left knee was prepped with alcohol swab and utilizing anterolateral approach, patient's left knee space was injected with 4:1  marcaine 0.5%: Kenalog 40mg /dL. Patient tolerated the procedure well without immediate complications.    Impression and Recommendations:     This case required medical decision making of moderate complexity. The above documentation has been reviewed and is accurate and complete Lyndal Pulley, DO       Note: This dictation was prepared with Dragon dictation along with smaller phrase technology. Any transcriptional errors that result from this process are unintentional.

## 2018-12-23 NOTE — Patient Instructions (Addendum)
  [address], [address] [place], [place] [address] Phone [phone]  Good to see you See me again in 5-6 weeks

## 2018-12-23 NOTE — Assessment & Plan Note (Signed)
Repeat injection given today.  Tolerated the procedure well, discussed icing regimen and home exercise, discussed which activities to avoid.  Patient should increase activity as tolerated.  Follow-up again in 4 to 8 weeks.

## 2018-12-26 ENCOUNTER — Other Ambulatory Visit: Payer: Self-pay

## 2018-12-26 ENCOUNTER — Ambulatory Visit (INDEPENDENT_AMBULATORY_CARE_PROVIDER_SITE_OTHER): Payer: 59

## 2018-12-26 DIAGNOSIS — D72829 Elevated white blood cell count, unspecified: Secondary | ICD-10-CM | POA: Diagnosis not present

## 2018-12-26 LAB — CBC WITH DIFFERENTIAL/PLATELET
Basophils Absolute: 0 10*3/uL (ref 0.0–0.1)
Basophils Relative: 0.2 % (ref 0.0–3.0)
Eosinophils Absolute: 0 10*3/uL (ref 0.0–0.7)
Eosinophils Relative: 0 % (ref 0.0–5.0)
HCT: 38.7 % — ABNORMAL LOW (ref 39.0–52.0)
Hemoglobin: 12.9 g/dL — ABNORMAL LOW (ref 13.0–17.0)
Lymphocytes Relative: 15.3 % (ref 12.0–46.0)
Lymphs Abs: 1.7 10*3/uL (ref 0.7–4.0)
MCHC: 33.3 g/dL (ref 30.0–36.0)
MCV: 90.4 fl (ref 78.0–100.0)
Monocytes Absolute: 0.9 10*3/uL (ref 0.1–1.0)
Monocytes Relative: 8.1 % (ref 3.0–12.0)
Neutro Abs: 8.4 10*3/uL — ABNORMAL HIGH (ref 1.4–7.7)
Neutrophils Relative %: 76.4 % (ref 43.0–77.0)
Platelets: 191 10*3/uL (ref 150.0–400.0)
RBC: 4.28 Mil/uL (ref 4.22–5.81)
RDW: 15.6 % — ABNORMAL HIGH (ref 11.5–15.5)
WBC: 11.1 10*3/uL — ABNORMAL HIGH (ref 4.0–10.5)

## 2018-12-29 ENCOUNTER — Other Ambulatory Visit: Payer: Self-pay | Admitting: Family Medicine

## 2018-12-29 ENCOUNTER — Telehealth: Payer: Self-pay

## 2018-12-29 MED FILL — MELOXICAM 15 MG TABLET: 15 | 30 days supply | Qty: 30 | Fill #0

## 2018-12-29 MED FILL — JANUVIA 100 MG TABLET: 100 | 90 days supply | Qty: 90 | Fill #0

## 2018-12-29 MED FILL — ROSUVASTATIN CALCIUM 10 MG: 10 | 90 days supply | Qty: 90 | Fill #1

## 2018-12-29 NOTE — Telephone Encounter (Signed)
That would definitely explain it.  Thank you.

## 2018-12-29 NOTE — Telephone Encounter (Signed)
Patient called in stating that he had a steroid injection Tuesday 12.8.20 prior to his lab work on Friday.

## 2018-12-31 ENCOUNTER — Other Ambulatory Visit: Payer: Self-pay

## 2018-12-31 ENCOUNTER — Telehealth: Payer: Self-pay | Admitting: *Deleted

## 2018-12-31 MED ORDER — PREDNISONE 50 MG PO TABS: ORAL_TABLET | ORAL | 0 refills | Status: DC

## 2018-12-31 MED FILL — predniSONE 50 MG TABS: 50 | 5 days supply | Qty: 5 | Fill #0

## 2018-12-31 NOTE — Telephone Encounter (Signed)
Spoke with patient per Dr. Thompson Caul advice. Patient scheduled for next visit.

## 2018-12-31 NOTE — Telephone Encounter (Signed)
Spoke to pt, he stated that he saw Dr. Tamala Julian last week for bilateral steroid injections. Pt said since the injections his knees have felt worse and usually this is not the case after receiving the injections. What does Dr. Tamala Julian recommend? Does he need to come back in for visco?

## 2018-12-31 NOTE — Telephone Encounter (Signed)
It can take 2 weeks to get better overall  I do think next step would be the visco injections  Otherwise can do 5 days of prednisone first if he would want and then re-evaluate

## 2019-01-05 ENCOUNTER — Other Ambulatory Visit: Payer: Self-pay | Admitting: Family Medicine

## 2019-01-05 ENCOUNTER — Other Ambulatory Visit: Payer: Self-pay | Admitting: Cardiology

## 2019-01-05 MED FILL — metFORMIN HCL 500 MG TABS: 500 | 90 days supply | Qty: 360 | Fill #1

## 2019-01-05 MED FILL — GABAPENTIN 300 MG CAPSULE: 300 | 30 days supply | Qty: 90 | Fill #0

## 2019-01-05 MED FILL — METOPROLOL SUCCINATE ER 100: 100 | 90 days supply | Qty: 90 | Fill #0

## 2019-01-11 NOTE — Assessment & Plan Note (Signed)
Patient has failed all other conservative therapy.  Viscosupplementation given today.  Tolerated procedure well, discussed icing regimen and home exercises, discussed which activities potentially helpful encourage weight loss.  Follow-up again 6 weeks

## 2019-01-11 NOTE — Progress Notes (Signed)
[name] [place], [place] [address] Phone: [phone] Subjective:   Travis [name], am serving as a scribe for Dr. Hulan Saas. This visit occurred during the SARS-CoV-2 public health emergency.  Safety protocols were in place, including screening questions prior to the visit, additional usage of staff PPE, and extensive cleaning of exam room while observing appropriate contact time as indicated for disinfecting solutions.  I'm seeing this patient by the request  of:    CC: Bilateral knee pain  [id number]   12/23/2018 Repeat injection given today.  Tolerated the procedure well, discussed icing regimen and home exercise, discussed which activities to avoid.  Patient should increase activity as tolerated.  Follow-up again in 4 to 8 weeks.  Update 01/12/2019 Travis [name] is a 63 y.o. male coming in with complaint of bilateral knee pain. Did not have any relief from steroid injections.  Patient has known bone-on-bone arthritic changes of the knees bilaterally.  Patient is concerned that if this does not seem to help with working on have worsening symptoms over the course of time.  Starting affect daily activities, patient does have many comorbidities and wants to avoid any type of surgical intervention.    Past Medical History:  Diagnosis Date  . Appendicitis   . Blood in stool   . Coronary artery disease 1997   s/p PCI by Dr. Glade Lloyd  . Diverticulosis of colon with hemorrhage 2009  . Dyslipidemia, goal LDL below 70   . Edema   . Encounter for long-term (current) use of other medications   . Essential hypertension, benign   . Family history of thyroid disease   . Frequent unifocal PVCs   . Morbid obesity ([facility])   . Myalgia and myositis, unspecified   . Prostate cancer ([facility]) 2012  . Pulmonary HTN ([facility]) 08/14/2018   PASP 68mmHg by echo 2017  . Sleep apnea with use of continuous positive airway pressure (CPAP)   . Thrombocytopenia  ([facility]) 05/25/2014  . Type II or unspecified type diabetes mellitus without mention of complication, not stated as uncontrolled    Past Surgical History:  Procedure Laterality Date  . ANGIOPLASTY  1997   Dr. Glade Lloyd  . APPENDECTOMY     Social History   Socioeconomic History  . Marital status: Married    Spouse name: Not on file  . Number of children: Not on file  . Years of education: Not on file  . Highest education level: Not on file  Occupational History  . 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 Smoking status: Never Smoker  . Smokeless tobacco: Never Used  Substance and Sexual Activity  . Alcohol use: [name]    Alcohol/week: 0.0 standard drinks  . Drug use: [name]  . Sexual activity: Not on file  Other Topics Concern  . Not on file  Social History Narrative  . Not on file   Social Determinants of Health   Financial Resource Strain:   . Difficulty of Paying Living Expenses: Not on file  Food Insecurity:   . Worried About Charity fundraiser in the Last Year: Not on file  . Ran Out of Food in the Last Year: Not on file  Transportation Needs:   . Lack of Transportation (Medical): Not on file  . Lack of Transportation (Non-Medical): Not on file  Physical Activity:   . Days of Exercise per Week: Not  on file  . Minutes of Exercise per Session: Not on file  Stress:   . Feeling of Stress : Not on file  Social Connections:   . Frequency of Communication with Friends and Family: Not on file  . Frequency of Social Gatherings with Friends and Family: Not on file  . Attends Religious Services: Not on file  . Active Member of Clubs or Organizations: Not on file  . Attends Archivist Meetings: Not on file  . Marital Status: Not on file   Allergies  Allergen Reactions  . Crestor [Rosuvastatin]     Muscle Pain   . Lipitor [Atorvastatin] Other (See Comments)    Arm weakness    Family History  Problem Relation Age  of Onset  . CVA Father   . Hypertension Father   . Diabetes Mother        DM  . Hypothyroidism Sister   . Hypertension Sister     Current Outpatient Medications (Endocrine & Metabolic):  [name]  JANUVIA 100 MG tablet, TAKE 1 TABLET BY MOUTH DAILY. .  metFORMIN (GLUCOPHAGE) 500 MG tablet, TAKE 2 TABLETS BY MOUTH 2 TIMES DAILY WITH A MEAL. .  predniSONE (DELTASONE) 50 MG tablet, Take one tablet daily for the next 5 days.  Current Outpatient Medications (Cardiovascular):  .  amLODipine (NORVASC) 10 MG tablet, TAKE 1 TABLET BY MOUTH DAILY. .  fenofibrate micronized (LOFIBRA) 200 MG capsule, TAKE 1 CAPSULE BY MOUTH DAILY BEFORE BREAKFAST .  furosemide (LASIX) 20 MG tablet, TAKE 1 TO 2 TABLETS BY MOUTH 2 TIMES DAILY AS NEEDED FOR EDEMA .  lisinopril (ZESTRIL) 40 MG tablet, TAKE 1 TABLET BY MOUTH DAILY. .  metoprolol succinate (TOPROL-XL) 100 MG 24 hr tablet, Take 1 tablet (100 mg total) by mouth daily. .  rosuvastatin (CRESTOR) 10 MG tablet, TAKE 1 TABLET BY MOUTH DAILY. PLEASE KEEP UPCOMING APPT FOR FUTURE REFILLS. THANK YOU  Current Outpatient Medications (Respiratory):  .  albuterol (PROVENTIL HFA;VENTOLIN HFA) 108 (90 Base) MCG/ACT inhaler, Inhale 2 puffs into the lungs every 6 (six) hours as needed for wheezing or shortness of breath.  Current Outpatient Medications (Analgesics):  .  acetaminophen (TYLENOL) 325 MG tablet, Take 650 mg by mouth every 6 (six) hours as needed. [name]  aspirin EC 81 MG tablet, Take 81 mg by mouth daily. .  meloxicam (MOBIC) 15 MG tablet, TAKE 1 TABLET BY MOUTH DAILY.   Current Outpatient Medications (Other):  [name]  Cholecalciferol (VITAMIN D3 PO), Take 1 tablet by mouth daily. [name]  gabapentin (NEURONTIN) 300 MG capsule, TAKE 1 CAPSULE BY MOUTH 3 TIMES DAILY .  Misc Natural Products (TART CHERRY ADVANCED PO), Take 1 tablet by mouth daily. .  traZODone (DESYREL) 50 MG tablet, TAKE 1/2 TO 1 TABLET BY MOUTH AT BEDTIME AS NEEDED FOR SLEEP .  TRUE METRIX BLOOD GLUCOSE TEST  test strip,  .  TRUEPLUS LANCETS 30G MISC,     Past medical history, social, surgical and family history all reviewed in electronic medical record.  [name] pertanent information unless stated regarding to the chief complaint.   Review of Systems:  [name] headache, visual changes, nausea, vomiting, diarrhea, constipation, dizziness, abdominal pain, skin rash, fevers, chills, night sweats, weight loss, swollen lymph nodes, body aches, joint swelling, muscle aches, chest pain, shortness of breath, mood changes.   Objective  Blood pressure 126/80, pulse 78, height 5\' 11"  (1.803 m), weight (!) 338 lb (153.3 kg), SpO2 98 %.    General: [name] apparent distress alert  and oriented x3 mood and affect normal, dressed appropriately.  Morbidly obese HEENT: Pupils equal, extraocular movements intact  Respiratory: Patient's speak in full sentences and does not appear short of breath  Cardiovascular: [name] lower extremity edema, non tender, [name] erythema  Skin: Warm dry intact with [name] signs of infection or rash on extremities or on axial skeleton.  Abdomen: Soft nontender  Neuro: Cranial nerves II through XII are intact, neurovascularly intact in all extremities with 2+ DTRs and 2+ pulses.  Lymph: [name] lymphadenopathy of posterior or anterior cervical chain or axillae bilaterally.  Gait antalgic MSK:  tender with full range of motion and good stability and symmetric strength and tone of shoulders, elbows, wrist, hip and ankles bilaterally.  Knee: Bilateral valgus deformity noted.  Abnormal thigh to calf ratio.  Tender to palpation over medial and PF joint line.  ROM full in flexion and extension and lower leg rotation. instability with valgus force.  painful patellar compression. Patellar glide with moderate crepitus. Patellar and quadriceps tendons unremarkable. Hamstring and quadriceps strength is normal.  After informed written and verbal consent, patient was seated on exam table. Right knee was prepped with  alcohol swab and utilizing anterolateral approach, patient's right knee space was injected with 22 mg per 1 mL of Monovisc (sodium hyaluronate) in a prefilled syringe was injected easily into the knee through a 22-gauge needle..Patient tolerated the procedure well without immediate complications.  After informed written and verbal consent, patient was seated on exam table. Left knee was prepped with alcohol swab and utilizing anterolateral approach, patient's left knee space was injected with 22 mg per 1 mL of Monovisc (sodium hyaluronate) in a prefilled syringe was injected easily into the knee through a 22-gauge needle..Patient tolerated the procedure well without immediate complications.   Impression and Recommendations:     This case required medical decision making of moderate complexity. The above documentation has been reviewed and is accurate and complete Lyndal Pulley, DO       Note: This dictation was prepared with Dragon dictation along with smaller phrase technology. Any transcriptional errors that result from this process are unintentional.

## 2019-01-12 ENCOUNTER — Ambulatory Visit (INDEPENDENT_AMBULATORY_CARE_PROVIDER_SITE_OTHER): Payer: 59 | Admitting: Family Medicine

## 2019-01-12 ENCOUNTER — Encounter: Payer: Self-pay | Admitting: Family Medicine

## 2019-01-12 ENCOUNTER — Other Ambulatory Visit: Payer: Self-pay

## 2019-01-12 DIAGNOSIS — M170 Bilateral primary osteoarthritis of knee: Secondary | ICD-10-CM | POA: Diagnosis not present

## 2019-01-12 NOTE — Patient Instructions (Signed)
Good to see you See me again in 4-5 weeks  

## 2019-02-03 ENCOUNTER — Ambulatory Visit: Payer: 59 | Admitting: Family Medicine

## 2019-02-03 ENCOUNTER — Encounter: Payer: Self-pay | Admitting: Family Medicine

## 2019-02-03 ENCOUNTER — Other Ambulatory Visit: Payer: Self-pay

## 2019-02-03 VITALS — BP 140/80 | HR 69 | Ht 71.0 in | Wt 342.0 lb

## 2019-02-03 DIAGNOSIS — M170 Bilateral primary osteoarthritis of knee: Secondary | ICD-10-CM | POA: Diagnosis not present

## 2019-02-03 NOTE — Assessment & Plan Note (Signed)
Bilateral injections given again today.  Tolerated the procedure well.  Patient has significant amount of anxiety and other things going on at this point the patient was not willing to talk to initially.  Patient will need to follow-up with his primary care provider in the near future.  We discussed that we can repeat the steroid injection safely every 8 to 10 weeks if necessary.  Hopefully that the viscosupplementation will make some improvement in the near future.  Follow-up again in 4 to 8 weeks

## 2019-02-03 NOTE — Patient Instructions (Signed)
Good to see you  See me again 2 months

## 2019-02-03 NOTE — Progress Notes (Signed)
[place] Phone: [phone] Subjective:    I'm seeing this patient by the request  of:  [name], MD  CC: Bilateral knee pain  [id number]   01/12/2019 Patient has failed all other conservative therapy.  Viscosupplementation given today.  Tolerated procedure well, discussed icing regimen and home exercises, discussed which activities potentially helpful encourage weight loss.  Follow-up again 6 weeks  02/03/2019 Travis [name] is a 64 y.o. male coming in with complaint of bilateral knee pain. States his knees are still problematic. Soreness as well. Doesn't believe the last injection helped.  Patient has severe amount of pain.  Still walking with the aid of a walker.  Feels like the viscosupplementation has not been helpful this time.  Concerned about this.     Past Medical History:  Diagnosis Date  . Appendicitis   . Blood in stool   . Coronary artery disease 1997   s/p PCI by Dr. Glade Lloyd  . Diverticulosis of colon with hemorrhage 2009  . Dyslipidemia, goal LDL below 70   . Edema   . Encounter for long-term (current) use of other medications   . Essential hypertension, benign   . Family history of thyroid disease   . Frequent unifocal PVCs   . Morbid obesity ([place])   . Myalgia and myositis, unspecified   . Prostate cancer ([place]) 2012  . Pulmonary HTN ([place]) 08/14/2018   PASP 14mmHg by echo 2017  . Sleep apnea with use of continuous positive airway pressure (CPAP)   . Thrombocytopenia ([place]) 05/25/2014  . Type II or unspecified type diabetes mellitus without mention of complication, not stated as uncontrolled    Past Surgical History:  Procedure Laterality Date  . ANGIOPLASTY  1997   Dr. Glade Lloyd  . APPENDECTOMY     Social History   Socioeconomic History  . Marital status: Married    Spouse name: Not on file  . Number of children: Not on file  . Years of education: Not on file  .  Highest education level: Not on file  Occupational History  . 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 Smoking status: Never Smoker  . Smokeless tobacco: Never Used  Substance and Sexual Activity  . Alcohol use: No    Alcohol/week: 0.0 standard drinks  . Drug use: No  . Sexual activity: Not on file  Other Topics Concern  . Not on file  Social History Narrative  . Not on file   Social Determinants of Health   Financial Resource Strain:   . Difficulty of Paying Living Expenses: Not on file  Food Insecurity:   . Worried About Charity fundraiser in the Last Year: Not on file  . Ran Out of Food in the Last Year: Not on file  Transportation Needs:   . Lack of Transportation (Medical): Not on file  . Lack of Transportation (Non-Medical): Not on file  Physical Activity:   . Days of Exercise per Week: Not on file  . Minutes of Exercise per Session: Not on file  Stress:   . Feeling of Stress : Not on file  Social Connections:   . Frequency of Communication with Friends and Family: Not on file  . Frequency of Social Gatherings with Friends and Family: Not on file  . Attends Religious Services: Not on file  . Active Member of Clubs or Organizations:  Not on file  . Attends Archivist Meetings: Not on file  . Marital Status: Not on file   Allergies  Allergen Reactions  . Crestor [Rosuvastatin]     Muscle Pain   . Lipitor [Atorvastatin] Other (See Comments)    Arm weakness    Family History  Problem Relation Age of Onset  . CVA Father   . Hypertension Father   . Diabetes Mother        DM  . Hypothyroidism Sister   . Hypertension Sister     Current Outpatient Medications (Endocrine & Metabolic):  [name]  JANUVIA 100 MG tablet, TAKE 1 TABLET BY MOUTH DAILY. .  metFORMIN (GLUCOPHAGE) 500 MG tablet, TAKE 2 TABLETS BY MOUTH 2 TIMES DAILY WITH A MEAL. .  predniSONE (DELTASONE) 50 MG tablet, Take one tablet daily for the  next 5 days.  Current Outpatient Medications (Cardiovascular):  .  amLODipine (NORVASC) 10 MG tablet, TAKE 1 TABLET BY MOUTH DAILY. .  fenofibrate micronized (LOFIBRA) 200 MG capsule, TAKE 1 CAPSULE BY MOUTH DAILY BEFORE BREAKFAST .  furosemide (LASIX) 20 MG tablet, TAKE 1 TO 2 TABLETS BY MOUTH 2 TIMES DAILY AS NEEDED FOR EDEMA .  lisinopril (ZESTRIL) 40 MG tablet, TAKE 1 TABLET BY MOUTH DAILY. .  metoprolol succinate (TOPROL-XL) 100 MG 24 hr tablet, Take 1 tablet (100 mg total) by mouth daily. .  rosuvastatin (CRESTOR) 10 MG tablet, TAKE 1 TABLET BY MOUTH DAILY. PLEASE KEEP UPCOMING APPT FOR FUTURE REFILLS. THANK YOU  Current Outpatient Medications (Respiratory):  .  albuterol (PROVENTIL HFA;VENTOLIN HFA) 108 (90 Base) MCG/ACT inhaler, Inhale 2 puffs into the lungs every 6 (six) hours as needed for wheezing or shortness of breath.  Current Outpatient Medications (Analgesics):  .  acetaminophen (TYLENOL) 325 MG tablet, Take 650 mg by mouth every 6 (six) hours as needed. [name]  aspirin EC 81 MG tablet, Take 81 mg by mouth daily. .  meloxicam (MOBIC) 15 MG tablet, TAKE 1 TABLET BY MOUTH DAILY.   Current Outpatient Medications (Other):  [name]  Cholecalciferol (VITAMIN D3 PO), Take 1 tablet by mouth daily. [name]  gabapentin (NEURONTIN) 300 MG capsule, TAKE 1 CAPSULE BY MOUTH 3 TIMES DAILY .  Misc Natural Products (TART CHERRY ADVANCED PO), Take 1 tablet by mouth daily. .  traZODone (DESYREL) 50 MG tablet, TAKE 1/2 TO 1 TABLET BY MOUTH AT BEDTIME AS NEEDED FOR SLEEP .  TRUE METRIX BLOOD GLUCOSE TEST test strip,  .  TRUEPLUS LANCETS 30G MISC,     Past medical history, social, surgical and family history all reviewed in electronic medical record.  No pertanent information unless stated regarding to the chief complaint.   Review of Systems:  No headache, visual changes, nausea, vomiting, diarrhea, constipation, dizziness, abdominal pain, skin rash, fevers, chills, night sweats, weight loss, swollen lymph  nodes, body aches, joint swelling, chest pain, shortness of breath, mood changes. POSITIVE muscle aches  Objective  Blood pressure 140/80, pulse 69, height 5\' 11"  (1.803 m), weight (!) 342 lb (155.1 kg), SpO2 97 %.   General: No apparent distress alert and oriented x3 mood and affect normal, dressed appropriately.  Morbidly obese HEENT: Pupils equal, extraocular movements intact  Respiratory: Patient's speak in full sentences and does not appear short of breath  Cardiovascular: 2+ lower extremity edema, non tender, no erythema  Skin: Warm dry intact with no signs of infection or rash on extremities or on axial skeleton.  Abdomen: Soft  Neuro: Cranial nerves II through XII  are intact, neurovascularly intact in all extremities with 2+ DTRs and 2+ pulses.  Lymph: No lymphadenopathy of posterior or anterior cervical chain or axillae bilaterally.  Gait antalgic MSK:     Knee: Bilateral valgus deformity noted. Large thigh to calf ratio.  Tender to palpation over medial and PF joint line.  ROM full in flexion and extension and lower leg rotation. instability with valgus force.  painful patellar compression. Patellar glide with moderate crepitus. Patellar and quadriceps tendons unremarkable. Hamstring and quadriceps strength is normal.  After informed written and verbal consent, patient was seated on exam table. Right knee was prepped with alcohol swab and utilizing anterolateral approach, patient's right knee space was injected with 4:1  marcaine 0.5%: Kenalog 40mg /dL. Patient tolerated the procedure well without immediate complications.  After informed written and verbal consent, patient was seated on exam table. Left knee was prepped with alcohol swab and utilizing anterolateral approach, patient's left knee space was injected with 4:1  marcaine 0.5%: Kenalog 40mg /dL. Patient tolerated the procedure well without immediate complications.   Impression and Recommendations:     This case  required medical decision making of moderate complexity. The above documentation has been reviewed and is accurate and complete Lyndal Pulley, DO       Note: This dictation was prepared with Dragon dictation along with smaller phrase technology. Any transcriptional errors that result from this process are unintentional.

## 2019-02-09 ENCOUNTER — Other Ambulatory Visit: Payer: Self-pay | Admitting: Family Medicine

## 2019-02-09 MED FILL — GABAPENTIN 300 MG CAPSULE: 300 | 30 days supply | Qty: 90 | Fill #0

## 2019-02-09 MED FILL — MELOXICAM 15 MG TABLET: 15 | 30 days supply | Qty: 30 | Fill #0

## 2019-03-02 ENCOUNTER — Other Ambulatory Visit: Payer: Self-pay | Admitting: Family Medicine

## 2019-03-02 MED FILL — LISINOPRIL 40 MG TABLET: 40 | 90 days supply | Qty: 90 | Fill #1

## 2019-03-02 MED FILL — AMLODIPINE BESYLATE 10 MG T: 10 | 90 days supply | Qty: 90 | Fill #0

## 2019-03-02 MED FILL — FENOFIBRATE 200 MG CAPSULE: 200 | 90 days supply | Qty: 90 | Fill #0

## 2019-03-02 MED FILL — traZODone HCL 50 MG TABS: 50 | 90 days supply | Qty: 90 | Fill #0

## 2019-03-02 MED FILL — FUROSEMIDE 20 MG TABS: 20 | 15 days supply | Qty: 60 | Fill #2

## 2019-03-30 ENCOUNTER — Other Ambulatory Visit: Payer: Self-pay | Admitting: Family Medicine

## 2019-03-30 MED FILL — MELOXICAM 15 MG TABLET: 15 | 30 days supply | Qty: 30 | Fill #0

## 2019-03-30 MED FILL — FUROSEMIDE 20 MG TABS: 20 | 15 days supply | Qty: 60 | Fill #3

## 2019-03-30 MED FILL — JANUVIA 100 MG TABLET: 100 | 30 days supply | Qty: 30 | Fill #1

## 2019-03-30 MED FILL — ROSUVASTATIN CALCIUM 10 MG: 10 | 90 days supply | Qty: 90 | Fill #2

## 2019-03-30 MED FILL — GABAPENTIN 300 MG CAPSULE: 300 | 30 days supply | Qty: 90 | Fill #0

## 2019-04-01 ENCOUNTER — Encounter: Payer: Self-pay | Admitting: Family Medicine

## 2019-04-01 ENCOUNTER — Ambulatory Visit: Payer: 59 | Admitting: Family Medicine

## 2019-04-01 ENCOUNTER — Other Ambulatory Visit: Payer: Self-pay

## 2019-04-01 VITALS — BP 130/87 | HR 83 | Temp 98.7°F | Resp 16 | Ht 71.0 in | Wt 345.1 lb

## 2019-04-01 DIAGNOSIS — E119 Type 2 diabetes mellitus without complications: Secondary | ICD-10-CM

## 2019-04-01 DIAGNOSIS — M19049 Primary osteoarthritis, unspecified hand: Secondary | ICD-10-CM | POA: Diagnosis not present

## 2019-04-01 MED ORDER — ALBUTEROL SULFATE HFA 108 (90 BASE) MCG/ACT IN AERS: 2.0000 | INHALATION_SPRAY | Freq: Four times a day (QID) | RESPIRATORY_TRACT | 3 refills | Status: DC | PRN

## 2019-04-01 MED FILL — ALBUTEROL SULFATE HFA 108 (: 108 (90 BAS | 25 days supply | Qty: 18 | Fill #0

## 2019-04-01 NOTE — Progress Notes (Signed)
   Subjective:    Patient ID: Travis [name], male    DOB: 02/08/1955, 64 y.o.   MRN: [id number]  HPI DM- chronic problem, on Januvia 100mg  daily, Metformin 1000mg  BID w/ hx of good control.  Last A1C 5.8.  On ACE for renal protection.  UTD on foot exam, eye exam.  AM sugars running 120-180.  Denies symptomatic lows.  No CP, SOB, HAs, visual changes, abd pain, N/V.  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  + family hx of RA.  He's concerned about possibility of RA   Review of Systems For ROS see HPI   This visit occurred during the SARS-CoV-2 public health emergency.  Safety protocols were in place, including screening questions prior to the visit, additional usage of staff PPE, and extensive cleaning of exam room while observing appropriate contact time as indicated for disinfecting solutions.       Objective:   Physical Exam Vitals reviewed.  Constitutional:      General: He is not in acute distress.    Appearance: He is well-developed. He is obese.  HENT:     Head: Normocephalic and atraumatic.  Eyes:     Conjunctiva/sclera: Conjunctivae normal.     Pupils: Pupils are equal, round, and reactive to light.  Neck:     Thyroid: No thyromegaly.  Cardiovascular:     Rate and Rhythm: Normal rate and regular rhythm.     Heart sounds: Normal heart sounds. No murmur.  Pulmonary:     Effort: Pulmonary effort is normal. No respiratory distress.     Breath sounds: Normal breath sounds.  Abdominal:     General: Bowel sounds are normal. There is no distension.     Palpations: Abdomen is soft.  Musculoskeletal:     Cervical back: Normal range of motion and neck supple.  Lymphadenopathy:     Cervical: No cervical adenopathy.  Skin:    General: Skin is warm and dry.  Neurological:     Mental Status: He is alert and oriented to person, place, and time.     Cranial Nerves: No cranial nerve deficit.  Psychiatric:      Behavior: Behavior normal.           Assessment & Plan:

## 2019-04-01 NOTE — Assessment & Plan Note (Signed)
Chronic problem.  Pt's sugars are ranging 120s-180s.  Denies symptomatic lows.  UTD on foot exam, eye exam.  On ACE for renal protection.  Stressed need for healthy diet and exercise as able.  Will continue to follow.

## 2019-04-01 NOTE — Assessment & Plan Note (Signed)
Pt has known arthritis in both thumbs but given family hx he is worried about possible RA.  Check labs and refer to rheum if needed.  Pt expressed understanding and is in agreement w/ plan.

## 2019-04-01 NOTE — Patient Instructions (Signed)
Follow up in 3-4 months to recheck sugar, BP, cholesterol We'll notify you of your lab results and make any changes if needed Continue to work on healthy diet and regular exercise Ice the painful joints as needed Call with any questions or concerns Stay Safe!  Stay Healthy!

## 2019-04-02 ENCOUNTER — Encounter: Payer: Self-pay | Admitting: General Practice

## 2019-04-02 LAB — BASIC METABOLIC PANEL
BUN: 25 mg/dL — ABNORMAL HIGH (ref 6–23)
CO2: 26 mEq/L (ref 19–32)
Calcium: 9.4 mg/dL (ref 8.4–10.5)
Chloride: 106 mEq/L (ref 96–112)
Creatinine, Ser: 1.31 mg/dL (ref 0.40–1.50)
GFR: 55.3 mL/min — ABNORMAL LOW (ref >60.00)
Glucose, Bld: 142 mg/dL — ABNORMAL HIGH (ref 70–99)
Potassium: 3.6 mEq/L (ref 3.5–5.1)
Sodium: 141 mEq/L (ref 135–145)

## 2019-04-02 LAB — RHEUMATOID FACTOR: Rheumatoid fact SerPl-aCnc: <14 IU/mL (ref <14)

## 2019-04-02 LAB — HEMOGLOBIN A1C: Hgb A1c MFr Bld: 6.3 % (ref 4.6–6.5)

## 2019-04-06 ENCOUNTER — Encounter: Payer: Self-pay | Admitting: Family Medicine

## 2019-04-06 ENCOUNTER — Other Ambulatory Visit: Payer: Self-pay | Admitting: Family Medicine

## 2019-04-06 ENCOUNTER — Other Ambulatory Visit: Payer: Self-pay

## 2019-04-06 ENCOUNTER — Ambulatory Visit (INDEPENDENT_AMBULATORY_CARE_PROVIDER_SITE_OTHER): Payer: 59 | Admitting: Family Medicine

## 2019-04-06 DIAGNOSIS — M170 Bilateral primary osteoarthritis of knee: Secondary | ICD-10-CM | POA: Diagnosis not present

## 2019-04-06 MED FILL — metFORMIN HCL 500 MG TABS: 500 | 90 days supply | Qty: 360 | Fill #0

## 2019-04-06 MED FILL — METOPROLOL SUCCINATE ER 100: 100 | 90 days supply | Qty: 90 | Fill #1

## 2019-04-06 NOTE — Patient Instructions (Signed)
Good to see you  Ice is your friend Stay active See me again in 8 weeks

## 2019-04-06 NOTE — Progress Notes (Signed)
[place] [address] [place] Phone: [phone] Subjective:   I Travis [name] am serving as a Education administrator for Dr. Hulan Saas.  This visit occurred during the SARS-CoV-2 public health emergency.  Safety protocols were in place, including screening questions prior to the visit, additional usage of staff PPE, and extensive cleaning of exam room while observing appropriate contact time as indicated for disinfecting solutions.   I'm seeing this patient by the request  of:  [name], MD  CC: Bilateral knee pain  [id number]   02/03/2019 Bilateral injections given again today.  Tolerated the procedure well.  Patient has significant amount of anxiety and other things going on at this point the patient was not willing to talk to initially.  Patient will need to follow-up with his primary care provider in the near future.  We discussed that we can repeat the steroid injection safely every 8 to 10 weeks if necessary.  Hopefully that the viscosupplementation will make some improvement in the near future.  Follow-up again in 4 to 8 weeks'  Update 04/06/2019 Travis [name] is a 64 y.o. male coming in with complaint of bilateral knee injections. Bilateral injections. Right hamstring pain. States he went to sit when he felt the pain.   Patient describes the pain as a dull, throbbing aching pain.  Affecting daily activities including walking.  Patient is using a walker on a more regular basis now secondary to it.  Past Medical History:  Diagnosis Date  . Appendicitis   . Blood in stool   . Coronary artery disease 1997   s/p PCI by Dr. Glade Lloyd  . Diverticulosis of colon with hemorrhage 2009  . Dyslipidemia, goal LDL below 70   . Edema   . Encounter for long-term (current) use of other medications   . Essential hypertension, benign   . Family history of thyroid disease   . Frequent unifocal PVCs   . Morbid obesity ([place])   . Myalgia and  myositis, unspecified   . Prostate cancer ([place]) 2012  . Pulmonary HTN ([place]) 08/14/2018   PASP 29mmHg by echo 2017  . Sleep apnea with use of continuous positive airway pressure (CPAP)   . Thrombocytopenia ([place]) 05/25/2014  . Type II or unspecified type diabetes mellitus without mention of complication, not stated as uncontrolled    Past Surgical History:  Procedure Laterality Date  . ANGIOPLASTY  1997   Dr. Glade Lloyd  . APPENDECTOMY     Social History   Socioeconomic History  . Marital status: Married    Spouse name: Not on file  . Number of children: Not on file  . Years of education: Not on file  . Highest education level: Not on file  Occupational History  . 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 Smoking status: Never Smoker  . Smokeless tobacco: Never Used  Substance and Sexual Activity  . Alcohol use: No    Alcohol/week: 0.0 standard drinks  . Drug use: No  . Sexual activity: Not on file  Other Topics Concern  . Not on file  Social History Narrative  . Not on file   Social Determinants of Health   Financial Resource Strain:   . Difficulty of Paying Living Expenses:   Food Insecurity:   . Worried About Charity fundraiser in the Last Year:   . [place] in the Last  Year:   Transportation Needs:   . Film/video editor (Medical):   [name] Lack of Transportation (Non-Medical):   Physical Activity:   . Days of Exercise per Week:   . Minutes of Exercise per Session:   Stress:   . Feeling of Stress :   Social Connections:   . Frequency of Communication with Friends and Family:   . Frequency of Social Gatherings with Friends and Family:   . Attends Religious Services:   . Active Member of Clubs or Organizations:   . Attends Archivist Meetings:   [name] Marital Status:    Allergies  Allergen Reactions  . Crestor [Rosuvastatin]     Muscle Pain   . Lipitor [Atorvastatin] Other (See Comments)     Arm weakness    Family History  Problem Relation Age of Onset  . CVA Father   . Hypertension Father   . Diabetes Mother        DM  . Hypothyroidism Sister   . Hypertension Sister     Current Outpatient Medications (Endocrine & Metabolic):  [name]  JANUVIA 100 MG tablet, TAKE 1 TABLET BY MOUTH DAILY. .  metFORMIN (GLUCOPHAGE) 500 MG tablet, TAKE 2 TABLETS BY MOUTH 2 TIMES DAILY WITH A MEAL. .  predniSONE (DELTASONE) 50 MG tablet, Take one tablet daily for the next 5 days.  Current Outpatient Medications (Cardiovascular):  .  amLODipine (NORVASC) 10 MG tablet, TAKE 1 TABLET BY MOUTH DAILY. .  fenofibrate micronized (LOFIBRA) 200 MG capsule, TAKE 1 CAPSULE BY MOUTH DAILY BEFORE BREAKFAST .  furosemide (LASIX) 20 MG tablet, TAKE 1 TO 2 TABLETS BY MOUTH 2 TIMES DAILY AS NEEDED FOR EDEMA .  lisinopril (ZESTRIL) 40 MG tablet, TAKE 1 TABLET BY MOUTH DAILY. .  metoprolol succinate (TOPROL-XL) 100 MG 24 hr tablet, Take 1 tablet (100 mg total) by mouth daily. .  rosuvastatin (CRESTOR) 10 MG tablet, TAKE 1 TABLET BY MOUTH DAILY. PLEASE KEEP UPCOMING APPT FOR FUTURE REFILLS. THANK YOU  Current Outpatient Medications (Respiratory):  .  albuterol (VENTOLIN HFA) 108 (90 Base) MCG/ACT inhaler, Inhale 2 puffs into the lungs every 6 (six) hours as needed for wheezing or shortness of breath.  Current Outpatient Medications (Analgesics):  .  acetaminophen (TYLENOL) 325 MG tablet, Take 650 mg by mouth every 6 (six) hours as needed. [name]  aspirin EC 81 MG tablet, Take 81 mg by mouth daily. .  meloxicam (MOBIC) 15 MG tablet, TAKE 1 TABLET BY MOUTH DAILY.   Current Outpatient Medications (Other):  [name]  Celery Seed OIL, by Does not apply route. .  Cholecalciferol (VITAMIN D3 PO), Take 1 tablet by mouth daily. [name]  gabapentin (NEURONTIN) 300 MG capsule, TAKE 1 CAPSULE BY MOUTH 3 TIMES DAILY .  Iron-Vitamins (GERITOL PO), Take by mouth. .  magnesium gluconate (MAGONATE) 500 MG tablet, Take 500 mg by mouth 2 (two)  times daily. .  Misc Natural Products (TART CHERRY ADVANCED PO), Take 1 tablet by mouth daily. .  traZODone (DESYREL) 50 MG tablet, TAKE 1/2 TO 1 TABLET BY MOUTH AT BEDTIME AS NEEDED FOR SLEEP .  TRUE METRIX BLOOD GLUCOSE TEST test strip,  .  TRUEPLUS LANCETS 30G MISC,  .  Turmeric Curcumin 500 MG CAPS, Take by mouth. .  zinc gluconate 50 MG tablet, Take 50 mg by mouth daily.   Reviewed prior external information including notes and imaging from  primary care provider As well as notes that were available from care everywhere and other healthcare systems.  Past medical history, social, surgical and family history all reviewed in electronic medical record.  No pertanent information unless stated regarding to the chief complaint.   Review of Systems:  No headache, visual changes, nausea, vomiting, diarrhea, constipation, dizziness, abdominal pain, skin rash, fevers, chills, night sweats, weight loss, swollen lymph nodes, , chest pain, shortness of breath, mood changes. POSITIVE muscle aches, body aches, joint swelling  Objective  Blood pressure 122/72, pulse 74, height 5\' 11"  (1.803 m), weight (!) 340 lb (154.2 kg), SpO2 97 %.   General: No apparent distress alert and oriented x3 mood and affect normal, dressed appropriately.  HEENT: Pupils equal, extraocular movements intact  Respiratory: Patient's speak in full sentences and does not appear short of breath  Cardiovascular: Trace lower extremity edema, non tender, no erythema  Neuro: Cranial nerves II through XII are intact, neurovascularly intact in all extremities with 2+ DTRs and 2+ pulses.  Gait severely antalgic walking with the aid of a walker Knee: Bilateral valgus deformity noted.  Abnormal thigh to calf ratio.  Tender to palpation over medial and PF joint line.  Mild limited range of motion secondary to pain and body habitus instability with valgus force.  painful patellar compression. Patellar glide with moderate  crepitus. Patellar and quadriceps tendons unremarkable. Hamstring and quadriceps strength is normal.  After informed written and verbal consent, patient was seated on exam table. Right knee was prepped with alcohol swab and utilizing anterolateral approach, patient's right knee space was injected with 4:1  marcaine 0.5%: Kenalog 40mg /dL. Patient tolerated the procedure well without immediate complications.  After informed written and verbal consent, patient was seated on exam table. Left knee was prepped with alcohol swab and utilizing anterolateral approach, patient's left knee space was injected with 4:1  marcaine 0.5%: Kenalog 40mg /dL. Patient tolerated the procedure well without immediate complications.   Impression and Recommendations:     This case required medical decision making of moderate complexity. The above documentation has been reviewed and is accurate and complete Lyndal Pulley, DO       Note: This dictation was prepared with Dragon dictation along with smaller phrase technology. Any transcriptional errors that result from this process are unintentional.

## 2019-04-06 NOTE — Assessment & Plan Note (Signed)
Chronic problem : Mild exacerbation  interventions previously, including medication management: Steroid injections, viscosupplementation.  With minimal discussed patient's gabapentin and to continue   Interventions this visit: Steroid injections again We discussed with patient the importance ergonomics, home exercises, icing regimen, and over-the-counter natural products.   Future considerations but will be based on evaluation and next visit:     Return to clinic: 8 weeks

## 2019-04-16 ENCOUNTER — Observation Stay (HOSPITAL_COMMUNITY): Payer: 59

## 2019-04-16 ENCOUNTER — Emergency Department (HOSPITAL_COMMUNITY): Payer: 59

## 2019-04-16 ENCOUNTER — Other Ambulatory Visit: Payer: Self-pay

## 2019-04-16 ENCOUNTER — Encounter (HOSPITAL_COMMUNITY): Payer: Self-pay

## 2019-04-16 ENCOUNTER — Inpatient Hospital Stay (HOSPITAL_COMMUNITY)
Admission: EM | Admit: 2019-04-16 | Discharge: 2019-04-23 | DRG: 095 | Disposition: A | Discharge status: Home / Self Care | Payer: 59 | Attending: Internal Medicine | Admitting: Internal Medicine

## 2019-04-16 ENCOUNTER — Emergency Department (HOSPITAL_COMMUNITY)
Admission: EM | Admit: 2019-04-16 | Discharge: 2019-04-16 | Disposition: A | Discharge status: Home / Self Care | Payer: 59 | Source: Home / Self Care | Attending: Emergency Medicine | Admitting: Emergency Medicine

## 2019-04-16 DIAGNOSIS — Z833 Family history of diabetes mellitus: Secondary | ICD-10-CM

## 2019-04-16 DIAGNOSIS — Z8546 Personal history of malignant neoplasm of prostate: Secondary | ICD-10-CM | POA: Insufficient documentation

## 2019-04-16 DIAGNOSIS — M5023 Other cervical disc displacement, cervicothoracic region: Secondary | ICD-10-CM | POA: Diagnosis not present

## 2019-04-16 DIAGNOSIS — I2510 Atherosclerotic heart disease of native coronary artery without angina pectoris: Secondary | ICD-10-CM | POA: Insufficient documentation

## 2019-04-16 DIAGNOSIS — M4802 Spinal stenosis, cervical region: Secondary | ICD-10-CM | POA: Diagnosis present

## 2019-04-16 DIAGNOSIS — I110 Hypertensive heart disease with heart failure: Secondary | ICD-10-CM | POA: Diagnosis not present

## 2019-04-16 DIAGNOSIS — Z79899 Other long term (current) drug therapy: Secondary | ICD-10-CM

## 2019-04-16 DIAGNOSIS — G959 Disease of spinal cord, unspecified: Secondary | ICD-10-CM | POA: Diagnosis not present

## 2019-04-16 DIAGNOSIS — G8250 Quadriplegia, unspecified: Secondary | ICD-10-CM | POA: Diagnosis not present

## 2019-04-16 DIAGNOSIS — G4733 Obstructive sleep apnea (adult) (pediatric): Secondary | ICD-10-CM | POA: Diagnosis not present

## 2019-04-16 DIAGNOSIS — Z20822 Contact with and (suspected) exposure to covid-19: Secondary | ICD-10-CM | POA: Diagnosis present

## 2019-04-16 DIAGNOSIS — E1169 Type 2 diabetes mellitus with other specified complication: Secondary | ICD-10-CM | POA: Diagnosis present

## 2019-04-16 DIAGNOSIS — I10 Essential (primary) hypertension: Secondary | ICD-10-CM | POA: Insufficient documentation

## 2019-04-16 DIAGNOSIS — G610 Guillain-Barre syndrome: Principal | ICD-10-CM | POA: Diagnosis present

## 2019-04-16 DIAGNOSIS — M479 Spondylosis, unspecified: Secondary | ICD-10-CM

## 2019-04-16 DIAGNOSIS — I272 Pulmonary hypertension, unspecified: Secondary | ICD-10-CM | POA: Diagnosis present

## 2019-04-16 DIAGNOSIS — S14121A Central cord syndrome at C1 level of cervical spinal cord, initial encounter: Secondary | ICD-10-CM | POA: Insufficient documentation

## 2019-04-16 DIAGNOSIS — R29898 Other symptoms and signs involving the musculoskeletal system: Secondary | ICD-10-CM | POA: Diagnosis present

## 2019-04-16 DIAGNOSIS — I5032 Chronic diastolic (congestive) heart failure: Secondary | ICD-10-CM | POA: Diagnosis not present

## 2019-04-16 DIAGNOSIS — E785 Hyperlipidemia, unspecified: Secondary | ICD-10-CM | POA: Diagnosis present

## 2019-04-16 DIAGNOSIS — Z7982 Long term (current) use of aspirin: Secondary | ICD-10-CM | POA: Insufficient documentation

## 2019-04-16 DIAGNOSIS — Z7984 Long term (current) use of oral hypoglycemic drugs: Secondary | ICD-10-CM | POA: Insufficient documentation

## 2019-04-16 DIAGNOSIS — E1165 Type 2 diabetes mellitus with hyperglycemia: Secondary | ICD-10-CM | POA: Diagnosis not present

## 2019-04-16 DIAGNOSIS — R531 Weakness: Secondary | ICD-10-CM

## 2019-04-16 DIAGNOSIS — E119 Type 2 diabetes mellitus without complications: Secondary | ICD-10-CM | POA: Insufficient documentation

## 2019-04-16 DIAGNOSIS — Z6841 Body Mass Index (BMI) 40.0 and over, adult: Secondary | ICD-10-CM

## 2019-04-16 DIAGNOSIS — E1142 Type 2 diabetes mellitus with diabetic polyneuropathy: Secondary | ICD-10-CM | POA: Diagnosis present

## 2019-04-16 DIAGNOSIS — M47812 Spondylosis without myelopathy or radiculopathy, cervical region: Secondary | ICD-10-CM | POA: Diagnosis present

## 2019-04-16 DIAGNOSIS — M6281 Muscle weakness (generalized): Secondary | ICD-10-CM | POA: Diagnosis not present

## 2019-04-16 DIAGNOSIS — Z794 Long term (current) use of insulin: Secondary | ICD-10-CM | POA: Diagnosis not present

## 2019-04-16 DIAGNOSIS — Z955 Presence of coronary angioplasty implant and graft: Secondary | ICD-10-CM

## 2019-04-16 LAB — CBC
HCT: 37.6 % — ABNORMAL LOW (ref 39.0–52.0)
Hemoglobin: 12.2 g/dL — ABNORMAL LOW (ref 13.0–17.0)
MCH: 30.3 pg (ref 26.0–34.0)
MCHC: 32.4 g/dL (ref 30.0–36.0)
MCV: 93.3 fL (ref 80.0–100.0)
Platelets: 202 10*3/uL (ref 150–400)
RBC: 4.03 MIL/uL — ABNORMAL LOW (ref 4.22–5.81)
RDW: 14.4 % (ref 11.5–15.5)
WBC: 6.2 10*3/uL (ref 4.0–10.5)
nRBC: 0 % (ref 0.0–0.2)

## 2019-04-16 LAB — CBG MONITORING, ED: Glucose-Capillary: 142 mg/dL — ABNORMAL HIGH (ref 70–99)

## 2019-04-16 LAB — COMPREHENSIVE METABOLIC PANEL
ALT: 31 U/L (ref 0–44)
AST: 27 U/L (ref 15–41)
Albumin: 3.6 g/dL (ref 3.5–5.0)
Alkaline Phosphatase: 56 U/L (ref 38–126)
Anion gap: 14 (ref 5–15)
BUN: 26 mg/dL — ABNORMAL HIGH (ref 8–23)
CO2: 26 mmol/L (ref 22–32)
Calcium: 9.5 mg/dL (ref 8.9–10.3)
Chloride: 105 mmol/L (ref 98–111)
Creatinine, Ser: 1.07 mg/dL (ref 0.61–1.24)
GFR calc Af Amer: >60 mL/min (ref >60)
GFR calc non Af Amer: >60 mL/min (ref >60)
Glucose, Bld: 138 mg/dL — ABNORMAL HIGH (ref 70–99)
Potassium: 4 mmol/L (ref 3.5–5.1)
Sodium: 145 mmol/L (ref 135–145)
Total Bilirubin: 0.5 mg/dL (ref 0.3–1.2)
Total Protein: 6.1 g/dL — ABNORMAL LOW (ref 6.5–8.1)

## 2019-04-16 NOTE — ED Provider Notes (Signed)
[facility] EMERGENCY DEPARTMENT Provider Note   CSN: [id number] Arrival date & time: 04/16/19  [id number]     History Chief Complaint  Patient presents with  . Stroke Symptoms    Travis [name] is a 64 y.o. male.  Patient presents to the ED with a chief complaint of hand weakness.  He states that he was working with the linens upstairs in the hospital and felt like he couldn't move both of his hands.  He reports a history of carpal tunnel.  He also states that he has had muscle cramps and spasms when his potassium has been low.  He states that he also felt flushed.  He denies any vision changes, slurred speech, or any other symptoms.  He states that his symptoms have resolved entirely now.  He states that he has recently had new assignments for work over the past 3 days that he isn't used to.  The history is provided by the patient. No language interpreter was used.       Past Medical History:  Diagnosis Date  . Appendicitis   . Blood in stool   . Coronary artery disease 1997   s/p PCI by Dr. Glade Lloyd  . Diverticulosis of colon with hemorrhage 2009  . Dyslipidemia, goal LDL below 70   . Edema   . Encounter for long-term (current) use of other medications   . Essential hypertension, benign   . Family history of thyroid disease   . Frequent unifocal PVCs   . Morbid obesity ([facility] [facility] [place])   . Myalgia and myositis, unspecified   . Prostate cancer ([place]) 2012  . Pulmonary HTN ([place]) 08/14/2018   PASP 18mmHg by echo 2017  . Sleep apnea with use of continuous positive airway pressure (CPAP)   . Thrombocytopenia ([place]) 05/25/2014  . Type II or unspecified type diabetes mellitus without mention of complication, not stated as uncontrolled     Patient Active Problem List   Diagnosis Date Noted  . 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 Degenerative arthritis of knee, bilateral 09/02/2018  . Pulmonary HTN ([place]) 08/14/2018  . Insomnia 07/30/2016  . Carpal tunnel syndrome, bilateral  07/30/2016  . Lumbar radiculopathy 07/30/2016  . Physical exam 06/13/2016  . Obstructive sleep apnea 06/21/2014  . Personal history of malignant neoplasm of prostate 06/21/2014  . Dyslipidemia 04/27/2014  . Morbid obesity ([place]) 06/02/2013  . Frequent unifocal PVCs   . Coronary artery disease   . Hypertension   . Diabetes ([place]) 10/27/2012    Past Surgical History:  Procedure Laterality Date  . ANGIOPLASTY  1997   Dr. Glade Lloyd  . APPENDECTOMY         Family History  Problem Relation Age of Onset  . CVA Father   . Hypertension Father   . Diabetes Mother        DM  . Hypothyroidism Sister   . Hypertension Sister     Social History   Tobacco Use  . Smoking status: Never Smoker  . Smokeless tobacco: Never Used  Substance Use Topics  . Alcohol use: No    Alcohol/week: 0.0 standard drinks  . Drug use: No    Home Medications Prior to Admission medications   Medication Sig Start Date End Date Taking? Authorizing Provider  acetaminophen (TYLENOL) 325 MG tablet Take 650 mg by mouth every 6 (six) hours as needed.    [provider]  albuterol (VENTOLIN HFA) 108 (90 Base) MCG/ACT inhaler Inhale 2 puffs into the lungs every 6 (six)  hours as needed for wheezing or shortness of breath. 04/01/19   [name], MD  amLODipine (NORVASC) 10 MG tablet TAKE 1 TABLET BY MOUTH DAILY. 03/02/19   [name], MD  aspirin EC 81 MG tablet Take 81 mg by mouth daily.    [provider]  Celery Seed OIL by Does not apply route.    [provider]  Cholecalciferol (VITAMIN D3 PO) Take 1 tablet by mouth daily.    [provider]  fenofibrate micronized (LOFIBRA) 200 MG capsule TAKE 1 CAPSULE BY MOUTH DAILY BEFORE BREAKFAST 03/02/19   [name], MD  furosemide (LASIX) 20 MG tablet TAKE 1 TO 2 TABLETS BY MOUTH 2 TIMES DAILY AS NEEDED FOR EDEMA 09/29/18   [name], MD  gabapentin (NEURONTIN) 300 MG capsule TAKE 1 CAPSULE BY MOUTH 3  TIMES DAILY 03/02/19   [name], MD  Iron-Vitamins (GERITOL PO) Take by mouth.    [provider]  JANUVIA 100 MG tablet TAKE 1 TABLET BY MOUTH DAILY. 12/29/18   [name], MD  lisinopril (ZESTRIL) 40 MG tablet TAKE 1 TABLET BY MOUTH DAILY. 11/24/18   [name], MD  magnesium gluconate (MAGONATE) 500 MG tablet Take 500 mg by mouth 2 (two) times daily.    [provider]  meloxicam (MOBIC) 15 MG tablet TAKE 1 TABLET BY MOUTH DAILY. 03/30/19   [name], MD  metFORMIN (GLUCOPHAGE) 500 MG tablet TAKE 2 TABLETS BY MOUTH 2 TIMES DAILY WITH A MEAL. 04/06/19   [name], MD  metoprolol succinate (TOPROL-XL) 100 MG 24 hr tablet Take 1 tablet (100 mg total) by mouth daily. 01/05/19   Lyda Jester M, PA-C  Misc Natural Products (TART CHERRY ADVANCED PO) Take 1 tablet by mouth daily.    [provider]  predniSONE (DELTASONE) 50 MG tablet Take one tablet daily for the next 5 days. 12/31/18   Lyndal Pulley, DO  rosuvastatin (CRESTOR) 10 MG tablet TAKE 1 TABLET BY MOUTH DAILY. PLEASE KEEP UPCOMING APPT FOR FUTURE REFILLS. THANK YOU 09/23/18   Sueanne Margarita, MD  traZODone (DESYREL) 50 MG tablet TAKE 1/2 TO 1 TABLET BY MOUTH AT BEDTIME AS NEEDED FOR SLEEP 03/02/19   [name], MD  TRUE METRIX BLOOD GLUCOSE TEST test strip  12/12/15   [provider]  TRUEPLUS LANCETS 30G MISC  12/12/15   [provider]  Turmeric Curcumin 500 MG CAPS Take by mouth.    [provider]  zinc gluconate 50 MG tablet Take 50 mg by mouth daily.    [provider]    Allergies    Crestor [rosuvastatin] and Lipitor [atorvastatin]  Review of Systems   Review of Systems  All other systems reviewed and are negative.   Physical Exam Updated Vital Signs BP (!) 164/79 (BP Location: Right Arm)   Pulse 84   Temp 97.8 F (36.6 C) (Oral)   Resp 18   Ht 5\' 9"  (1.753 m)   Wt (!) 154.2 kg   SpO2 99%   BMI  50.21 kg/m   Physical Exam Vitals and nursing note reviewed.  Constitutional:      Appearance: He is well-developed.  HENT:     Head: Normocephalic and atraumatic.  Eyes:     Conjunctiva/sclera: Conjunctivae normal.  Cardiovascular:     Rate and Rhythm: Normal rate and regular rhythm.     Heart sounds: No murmur.  Pulmonary:  Effort: Pulmonary effort is normal. No respiratory distress.     Breath sounds: Normal breath sounds.  Abdominal:     Palpations: Abdomen is soft.     Tenderness: There is no abdominal tenderness.  Musculoskeletal:     Cervical back: Neck supple.  Skin:    General: Skin is warm and dry.  Neurological:     Mental Status: He is alert.     Comments: CN 3-12 intact Speech is clear Movements are goal oriented Normal finger to nose No pronator drift  Psychiatric:        Mood and Affect: Mood normal.        Behavior: Behavior normal.     ED Results / Procedures / Treatments   Labs (all labs ordered are listed, but only abnormal results are displayed) Labs Reviewed  CBC  COMPREHENSIVE METABOLIC PANEL  CBG MONITORING, ED    EKG None  Radiology No results found.  Procedures Procedures (including critical care time)  Medications Ordered in ED Medications - No data to display  ED Course  I have reviewed the triage vital signs and the nursing notes.  Pertinent labs & imaging results that were available during my care of the patient were reviewed by me and considered in my medical decision making (see chart for details).    MDM Rules/Calculators/A&P                      Patient with brief episode of reported inability to move his hands. His symptoms have now resolved.  His symptoms were bilateral, I doubt stroke.  He does have history of electrolyte imbalances in the past, will check labs.  He has a normal neuro exam currently.    Labs are reassuring.  Asymptomatic.  Plan for discharge with return precautions.  Final Clinical  Impression(s) / ED Diagnoses Final diagnoses:  Weakness    Rx / DC Orders ED Discharge Orders    None       Montine Circle, PA-C 04/16/19 0710    Mesner, [name], MD 04/16/19 2255

## 2019-04-16 NOTE — ED Notes (Signed)
Pt endorses feeling "better than upstairs."

## 2019-04-16 NOTE — ED Notes (Signed)
Pt to go to MRI

## 2019-04-16 NOTE — ED Provider Notes (Signed)
[facility] Emergency Department Provider Note MRN:  [id number]  Arrival date & time: 04/16/19     Chief Complaint   Numbness   History of Present Illness   Travis [name] is a 64 y.o. year-old male with a history of obesity, prostate cancer presenting to the ED with chief complaint of numbness.  Patient endorsing numbness and weakness to the bilateral hands.  Trouble extending the fingers and wrist.  Symptoms on and off for the past several hours.  Denies headache, no vision change, no trouble swallowing, no neck pain, no chest pain or shortness of breath, no abdominal pain.  Review of Systems  A complete 10 system review of systems was obtained and all systems are negative except as noted in the HPI and PMH.   Patient's Health History    Past Medical History:  Diagnosis Date  . Appendicitis   . Blood in stool   . Coronary artery disease 1997   s/p PCI by Dr. Glade [name]  . Diverticulosis of colon with hemorrhage 2009  . Dyslipidemia, goal LDL below 70   . Edema   . Encounter for long-term (current) use of other medications   . Essential hypertension, benign   . Family history of thyroid disease   . Frequent unifocal PVCs   . Morbid obesity ([place])   . Myalgia and myositis, unspecified   . Prostate cancer ([place]) 2012  . Pulmonary HTN ([place]) 08/14/2018   PASP 9mmHg by echo 2017  . Sleep apnea with use of continuous positive airway pressure (CPAP)   . Thrombocytopenia ([place]) 05/25/2014  . Type II or unspecified type diabetes mellitus without mention of complication, not stated as uncontrolled     Past Surgical History:  Procedure Laterality Date  . ANGIOPLASTY  1997   Dr. Glade [name]  . APPENDECTOMY      Family History  Problem Relation Age of Onset  . CVA Father   . Hypertension Father   . Diabetes Mother        DM  . Hypothyroidism Sister   . Hypertension Sister     Social History   Socioeconomic History  . Marital status: Married    Spouse  name: Not on file  . Number of children: Not on file  . Years of education: Not on file  . Highest education level: Not on file  Occupational History  . Occupation: Scientific laboratory technician: [name]    Comment: mod/heavy lifting  Tobacco Use  . Smoking status: Never Smoker  . Smokeless tobacco: Never Used  Substance and Sexual Activity  . Alcohol use: No    Alcohol/week: 0.0 standard drinks  . Drug use: No  . Sexual activity: Not on file  Other Topics Concern  . Not on file  Social History Narrative  . Not on file   Social Determinants of Health   Financial Resource Strain:   . Difficulty of Paying Living Expenses:   Food Insecurity:   . Worried About Charity fundraiser in the Last Year:   . Arboriculturist in the Last Year:   Transportation Needs:   . Film/video editor (Medical):   Travis [name] Kitchen Lack of Transportation (Non-Medical):   Physical Activity:   . Days of Exercise per Week:   . Minutes of Exercise per Session:   Stress:   . Feeling of Stress :   Social Connections:   . Frequency of Communication with Friends and Family:   . Frequency of  Social Gatherings with Friends and Family:   . Attends Religious Services:   . Active Member of Clubs or Organizations:   . Attends Archivist Meetings:   Travis [name] Kitchen Marital Status:   Intimate Partner Violence:   . Fear of Current or Ex-Partner:   . Emotionally Abused:   Travis [name] Kitchen Physically Abused:   . Sexually Abused:      Physical Exam   Vitals:   04/16/19 1300 04/16/19 1345  BP: 128/70 133/63  Pulse: 79 71  Resp:    Temp:    SpO2: 98% 99%    CONSTITUTIONAL: Well-appearing, NAD NEURO:  Alert and oriented x 3, decreased strength to the extensor aspect of the bilateral hands, wrists, subjective decreased sensation to the bilateral hands. EYES:  eyes equal and reactive ENT/NECK:  no LAD, no JVD CARDIO: Regular rate, well-perfused, normal S1 and S2 PULM:  CTAB no wheezing or rhonchi GI/GU:  normal bowel sounds,  non-distended, non-tender MSK/SPINE:  No gross deformities, no edema SKIN:  no rash, atraumatic PSYCH:  Appropriate speech and behavior  *Additional and/or pertinent findings included in MDM below  Diagnostic and Interventional Summary    EKG Interpretation  Date/Time:    Ventricular Rate:    PR Interval:    QRS Duration:   QT Interval:    QTC Calculation:   R Axis:     Text Interpretation:        Labs Reviewed - No data to display  DG Cervical Spine With Flex & Extend  Final Result    MR Cervical Spine Wo Contrast  Final Result      Medications - No data to display   Procedures  /  Critical Care Procedures  ED Course and Medical Decision Making  I have reviewed the triage vital signs, the nursing notes, and pertinent available records from the EMR.  Pertinent labs & imaging results that were available during my care of the patient were reviewed by me and considered in my medical decision making (see below for details).     Question peripheral nerve versus cervical myelopathy, MRI pending.  MRI revealing and multiple abnormalities.  Discussed with Dr. Ellene Route of neurosurgery, who recommends flexion and extension plain films to determine possible presence of spondylolisthesis.  Awaiting recommendations from neurosurgery, signed out to oncoming provider at shift change.  Travis [name]. Travis Small, MD [place] [facility] [name]  Final Clinical Impressions(s) / ED Diagnoses     ICD-10-CM   1. Cervical myelopathy ([place])  G95.9     ED Discharge Orders    None       Discharge Instructions Discussed with and Provided to Patient:   Discharge Instructions   None       Maudie Flakes, MD 04/16/19 [phone]

## 2019-04-16 NOTE — Consult Note (Signed)
NEURO HOSPITALIST CONSULT NOTE   Requestig physician: Dr. Ellene Route  Reason for Consult: Bilateral hand weakness  History obtained from:   Patient and Chart    HPI:                                                                                                                                          Travis [name] is an 64 y.o. 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 He denies having had any grip strength or more proximal arm weakness, just an inability to straighten out his digits in both hands. He also denies having had any trouble walking relative to his baseline ambulatory difficulties due to low back pain and increased BMI. He decided to be seen in the ED, where an MRI of the cervical spine was obtained, revealing spondylosis and 2 equivocal cord lesions at C5-6, but no cord compression.   Neurosurgery was consulted and felt that the deficits exhibited by the patient were unusual for a surgical etiology and requested Neurology consult.   Of note, on further review by the cervical spine MRI by the Neurologist, the hyperintensities in the cord appeared more consistent with artifact.   Further interview by Neurologist reveals that earlier today, the patient had a short spell of bilateral foot paresthesias that spontaneously resolved. He has had no other sensory symptoms. He has not been vaccinated recently. He states that he fell ill about 2 weeks ago with what felt like a mild infection, but that his symptoms have since resolved.   The patient denies confusion, vision changes, speech changes, facial weakness, CP, SOB, symptoms of infection or new/worsened trouble ambulating. No incoordination. Does not endorse any B/B incontinence.   Past Medical History:  Diagnosis Date  .  Appendicitis   . Blood in stool   . Coronary artery disease 1997   s/p PCI by Dr. Glade Lloyd  . Diverticulosis of colon with hemorrhage 2009  . Dyslipidemia, goal LDL below 70   . Edema   . Encounter for long-term (current) use of other medications   . Essential hypertension, benign   . Family history of thyroid disease   . Frequent unifocal PVCs   . Morbid obesity ([place])   . Myalgia and myositis, unspecified   . Prostate cancer ([place]) 2012  . Pulmonary HTN ([place]) 08/14/2018   PASP 49mmHg by echo 2017  . Sleep apnea with use of continuous positive airway pressure (CPAP)   . Thrombocytopenia ([place]) 05/25/2014  . Type II or unspecified type diabetes mellitus without mention of complication, not stated as  uncontrolled     Past Surgical History:  Procedure Laterality Date  . ANGIOPLASTY  1997   Dr. Glade Lloyd  . APPENDECTOMY      Family History  Problem Relation Age of Onset  . CVA Father   . Hypertension Father   . Diabetes Mother        DM  . Hypothyroidism Sister   . Hypertension Sister               Social History:  reports that he has never smoked. He has never used smokeless tobacco. He reports that he does not drink alcohol or use drugs.  Allergies  Allergen Reactions  . Crestor [Rosuvastatin]     Muscle Pain   . Lipitor [Atorvastatin] Other (See Comments)    Arm weakness     HOME MEDICATIONS:                                                                                                                      No current facility-administered medications on file prior to encounter.   Current Outpatient Medications on File Prior to Encounter  Medication Sig Dispense Refill  . acetaminophen (TYLENOL) 500 MG tablet Take 1,000 mg by mouth every 6 (six) hours as needed for moderate pain.    [name] amLODipine (NORVASC) 10 MG tablet TAKE 1 TABLET BY MOUTH DAILY. 90 tablet 0  . aspirin EC 81 MG tablet Take 81 mg by mouth daily.    . Cholecalciferol (VITAMIN D3 PO) Take 1 tablet  by mouth daily.    . fenofibrate micronized (LOFIBRA) 200 MG capsule TAKE 1 CAPSULE BY MOUTH DAILY BEFORE BREAKFAST (Patient taking differently: Take 200 mg by mouth daily before breakfast. ) 90 capsule 0  . furosemide (LASIX) 20 MG tablet TAKE 1 TO 2 TABLETS BY MOUTH 2 TIMES DAILY AS NEEDED FOR EDEMA (Patient taking differently: Take 20-40 mg by mouth daily as needed for edema. ) 60 tablet 6  . gabapentin (NEURONTIN) 300 MG capsule TAKE 1 CAPSULE BY MOUTH 3 TIMES DAILY 90 capsule 0  . Iron-Vitamins (GERITOL PO) Take by mouth.    [name] JANUVIA 100 MG tablet TAKE 1 TABLET BY MOUTH DAILY. 90 tablet 1  . lisinopril (ZESTRIL) 40 MG tablet TAKE 1 TABLET BY MOUTH DAILY. 90 tablet 1  . magnesium gluconate (MAGONATE) 500 MG tablet Take 500 mg by mouth daily.     . meloxicam (MOBIC) 15 MG tablet TAKE 1 TABLET BY MOUTH DAILY. 30 tablet 0  . metFORMIN (GLUCOPHAGE) 500 MG tablet TAKE 2 TABLETS BY MOUTH 2 TIMES DAILY WITH A MEAL. (Patient taking differently: Take 1,000 mg by mouth 2 (two) times daily with a meal. ) 360 tablet 1  . metoprolol succinate (TOPROL-XL) 100 MG 24 hr tablet Take 1 tablet (100 mg total) by mouth daily. 90 tablet 1  . Misc Natural Products (TART CHERRY ADVANCED PO) Take 1 tablet by mouth daily.    . Potassium 95 MG TABS Take 95 mg by mouth  daily.    . rosuvastatin (CRESTOR) 5 MG tablet Take 5 mg by mouth daily.    . traZODone (DESYREL) 50 MG tablet TAKE 1/2 TO 1 TABLET BY MOUTH AT BEDTIME AS NEEDED FOR SLEEP 90 tablet 0  . Turmeric Curcumin 500 MG CAPS Take by mouth.    . zinc gluconate 50 MG tablet Take 50 mg by mouth daily.    [name] albuterol (VENTOLIN HFA) 108 (90 Base) MCG/ACT inhaler Inhale 2 puffs into the lungs every 6 (six) hours as needed for wheezing or shortness of breath. 18 g 3  . predniSONE (DELTASONE) 50 MG tablet Take one tablet daily for the next 5 days. (Patient not taking: Reported on 04/16/2019) 5 tablet 0  . rosuvastatin (CRESTOR) 10 MG tablet TAKE 1 TABLET BY MOUTH DAILY.  PLEASE KEEP UPCOMING APPT FOR FUTURE REFILLS. THANK YOU 90 tablet 3  . TRUE METRIX BLOOD GLUCOSE TEST test strip   1  . TRUEPLUS LANCETS 30G MISC   1      ROS:                                                                                                                                       As per HPI. All other systems negative.   Blood pressure 133/63, pulse 71, temperature 98 F (36.7 C), temperature source Oral, resp. rate 20, SpO2 99 %.   General Examination:                                                                                                       Physical Exam  HEENT-  [place]/AT   Lungs- Respirations unlabored Extremities- Mild pitting edema to distal BLE   Neurological Examination Mental Status:  Alert, oriented, thought content appropriate.  Speech fluent without evidence of aphasia.  Able to follow all commands without difficulty. Cranial Nerves: II:  Visual fields intact. PERRL.  III,IV, VI: EOMI without nystagmus. No ptosis.  V,VII: Smile symmetric, facial FT sensation equal bilaterally. No bulbar weakness VIII: hearing intact to conversation IX,X: No hypophonia XI: Symmetric mild weakness of shoulder shrug and neck flexion.  XII: midline tongue extension Motor: Mild weakness of neck extension.  RUE: 2/5 finger abduction and extension. 3/5 grip. 3/5 wrist flexion and extension. 4-/5 biceps and triceps. 4/5 deltoid. LUE:  2/5 finger abduction and extension. 3/5 grip. 3/5 wrist flexion and extension. 4-/5 biceps and triceps. 4/5 deltoid. RLE 4/5 hip flexion and knee extension.  LLE: 4/5 hip flexion and  knee extension. Sensory: FT intact x 4, including hands and feet bilaterally.  Deep Tendon Reflexes:  1+ brachioradialis and biceps bilaterally.  2+ patellae and achilles bilaterally.  Plantars: Right: downgoing   Left: downgoing Cerebellar: No ataxia with FNF bilaterally.  Gait: Able to stand and ambulate with his own power. Stooped, antalgic gait.  Unchanged per patient relative to his baseline.    Lab Results: Basic Metabolic Panel: Recent Labs  Lab 04/16/19 0556  NA 145  K 4.0  CL 105  CO2 26  GLUCOSE 138*  BUN 26*  CREATININE 1.07  CALCIUM 9.5    CBC: Recent Labs  Lab 04/16/19 0556  WBC 6.2  HGB 12.2*  HCT 37.6*  MCV 93.3  PLT 202    Cardiac Enzymes: No results for input(s): CKTOTAL, CKMB, CKMBINDEX, TROPONINI in the last 168 hours.  Lipid Panel: No results for input(s): CHOL, TRIG, HDL, CHOLHDL, VLDL, LDLCALC in the last 168 hours.  Imaging: DG Cervical Spine With Flex & Extend  Result Date: 04/16/2019 CLINICAL DATA:  Bilateral hand numbness. EXAM: CERVICAL SPINE COMPLETE WITH FLEXION AND EXTENSION VIEWS COMPARISON:  MRI cervical spine from same day. FINDINGS: The lateral view is diagnostic to the C7 level. There is no acute fracture or subluxation. Vertebral body heights are preserved. Trace anterolisthesis at C3-C4 with flexion. Alignment is otherwise normal. Moderate to severe disc height loss at C5-C6 and C6-C7. Mild disc height loss at C4-C5. Mild right C4-C5 and moderate left C5-C6 and C6-C7 neuroforaminal stenosis due to uncovertebral hypertrophy. Normal prevertebral soft tissues. IMPRESSION: 1. Trace anterolisthesis at C3-C4 with flexion. 2. Multilevel cervical spondylosis as described above, greatest at C5-C6 and C6-C7. Electronically Signed   By: Titus Dubin M.D.   On: 04/16/2019 14:49   MR Cervical Spine Wo Contrast  Result Date: 04/16/2019 CLINICAL DATA:  Acute bilateral hand numbness and weakness. EXAM: MRI CERVICAL SPINE WITHOUT CONTRAST TECHNIQUE: Multiplanar, multisequence MR imaging of the cervical spine was performed. No intravenous contrast was administered. COMPARISON:  None. FINDINGS: Alignment: Physiologic. Vertebrae: No fracture, evidence of discitis, or bone lesion. Cord: Tiny focal area abnormal signal in the cervical spinal cord centrally and to the left at C5-6 seen on images 17 and 18  of series 13, likely due to a small focal disc protrusion with accompanying osteophytes. Posterior Fossa, vertebral arteries, paraspinal tissues: Negative. Disc levels: C2-3: Normal. C3-4: Tiny central disc bulge with no neural impingement. Moderate left facet arthritis. No foraminal stenosis. C4-5: Broad-based disc osteophyte complex asymmetric to the right encroaching upon the right lateral recess and right neural foramen which could affect the right C5 nerve. No significant facet arthritis. C5-6: Disc space narrowing. Broad-based disc osteophyte complex asymmetric to the left. This compresses the ventral aspect of the left side of the spinal cord with slight myelopathy in the cord at that level. This could affect the left C6 nerve. Severe left foraminal stenosis. Moderate right facet arthritis. C6-7: Disc space narrowing. Prominent broad-based disc osteophyte complex slightly asymmetric to the right. Bilateral foraminal stenosis which could affect either or both C7 nerves. C7-T1: Tiny disc bulge to the left of midline without neural impingement. No foraminal stenosis. T1-2: Focal disc protrusion to the right of midline into the right lateral recess and right neural foramen which could affect the right T1 nerve. IMPRESSION: 1. Soft disc protrusion at T1-2 to the right which could affect the right T1 nerve. 2. Right lateral recess and right foraminal impingement at C4-5 which could affect the right C5  nerve. 3. Left lateral recess and foraminal stenosis at C5-6 which could affect the left C6 nerve. 4. Bilateral foraminal stenosis at C6-7 which could affect either or both C7 nerves. 5. 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 Electronically Signed   By: Lorriane Shire M.D.   On: 04/16/2019 12:06    Assessment: 64 year old male with new onset of BUE and BLE weakness.  1. Exam reveals distal > proximal symmetric upper extremity weakness and symmetric, less severe BLE weakness.  No sensory deficits. Reflexes are normal in BLE and mildly diminished in BUE.  2. MRI c-spine with equivocal lesions at C5-6 per official Radiology report. On further review by the cervical spine MRI by the Neurologist, the hyperintensities in the cord appear more consistent with artifact.  3. DDx includes "man in a barrel syndrome" from strategically located cerebral infarction, myopathy and GBS. In early stages of GBS, it should be noted, reflexes may be preserved.    Recommendations: 1. CK level (ordered) 2. MRI brain (ordered) 3. If MRI brain shows no stroke and weakness worsens or reflexes become significantly decreased/absent, then an LP under fluoroscopy should be obtained.  4. He will need inpatient PT and OT.    Electronically signed: Dr. Kerney Elbe 04/16/2019, 6:08 PM

## 2019-04-16 NOTE — ED Notes (Signed)
Pt given diet coke. 

## 2019-04-16 NOTE — ED Notes (Signed)
Neuro at bedside.

## 2019-04-16 NOTE — ED Notes (Signed)
Pt. Has returned from MRI

## 2019-04-16 NOTE — ED Notes (Signed)
Pt continues to report numbness bilateral arms. No other neuro symptoms reported.

## 2019-04-16 NOTE — Discharge Instructions (Addendum)
It is unclear what caused your symptoms.  Please contact your doctor for a follow-up appointment.  If your symptoms change or worsen, please return to the ER.

## 2019-04-16 NOTE — H&P (Signed)
Travis [name] is an 64 y.o. male.   Chief Complaint: Sudden onset of hand weakness this morning HPI: Travis [name] is a 64 year old right-handed individual who tells me that this morning he was working when he noted that he could not open his hands very well on either side.  He denies any trauma but he notes that he could grasp with his fingers but he could not open his hands to stretch the fingers out he also felt some weakness in his arms his legs felt strong and he was able to move about but because of this problem he came to the emergency department for further work-up and an MRI of the neck was obtained.  The study demonstrates that the patient has some cervical stenosis at C4-5 and also at C5-6 and there is a small lesion in the center of the cord that would be consistent with a small contusion or some myelomalacia nonetheless no overt compression of the cord that is active is present.  The patient was further evaluated with a flexion-extension film of the neck and no abnormal motion is noted across the levels of C3-C4-C5 and C6.  I have evaluated the patient note that he has weakness in the finger extensors of the wrist extensors the grip strength in the intrinsics on both hands his triceps are also weak to 4 out of 5 but his bicep strength appears intact deltoid strength is intact also.  His deep tendon reflexes are absent in the bicep and tricep 1+ in the patellae absent in the Achilles.  Lower extremity strength appears intact.  Past Medical History:  Diagnosis Date  . Appendicitis   . Blood in stool   . Coronary artery disease 1997   s/p PCI by Dr. Glade Lloyd  . Diverticulosis of colon with hemorrhage 2009  . Dyslipidemia, goal LDL below 70   . Edema   . Encounter for long-term (current) use of other medications   . Essential hypertension, benign   . Family history of thyroid disease   . Frequent unifocal PVCs   . Morbid obesity ([place])   . Myalgia and myositis, unspecified   . Prostate  cancer ([place]) 2012  . Pulmonary HTN ([place]) 08/14/2018   PASP 65mmHg by echo 2017  . Sleep apnea with use of continuous positive airway pressure (CPAP)   . Thrombocytopenia ([place]) 05/25/2014  . Type II or unspecified type diabetes mellitus without mention of complication, not stated as uncontrolled     Past Surgical History:  Procedure Laterality Date  . ANGIOPLASTY  1997   Dr. Glade Lloyd  . APPENDECTOMY      Family History  Problem Relation Age of Onset  . CVA Father   . Hypertension Father   . Diabetes Mother        DM  . Hypothyroidism Sister   . Hypertension Sister    Social History:  reports that he has never smoked. He has never used smokeless tobacco. He reports that he does not drink alcohol or use drugs.  Allergies:  Allergies  Allergen Reactions  . Crestor [Rosuvastatin]     Muscle Pain   . Lipitor [Atorvastatin] Other (See Comments)    Arm weakness     (Not in a hospital admission)   Results for orders placed or performed during the hospital encounter of 04/16/19 (from the past 48 hour(s))  CBC     Status: Abnormal   Collection Time: 04/16/19  5:56 AM  Result Value Ref Range   WBC 6.2  4.0 - 10.5 K/uL   RBC 4.03 (L) 4.22 - 5.81 MIL/uL   Hemoglobin 12.2 (L) 13.0 - 17.0 g/dL   HCT 37.6 (L) 39.0 - 52.0 %   MCV 93.3 80.0 - 100.0 fL   MCH 30.3 26.0 - 34.0 pg   MCHC 32.4 30.0 - 36.0 g/dL   RDW 14.4 11.5 - 15.5 %   Platelets 202 150 - 400 K/uL   nRBC 0.0 0.0 - 0.2 %    Comment: Performed at [facility], [place] [address] [place], [place] [address]  Comprehensive metabolic panel     Status: Abnormal   Collection Time: 04/16/19  5:56 AM  Result Value Ref Range   Sodium 145 135 - 145 mmol/L   Potassium 4.0 3.5 - 5.1 mmol/L   Chloride 105 98 - 111 mmol/L   CO2 26 22 - 32 mmol/L   Glucose, Bld 138 (H) 70 - 99 mg/dL    Comment: Glucose reference range applies only to samples taken after fasting for at least 8 hours.   BUN 26 (H) 8 - 23 mg/dL   Creatinine,  Ser 1.07 0.61 - 1.24 mg/dL   Calcium 9.5 8.9 - 10.3 mg/dL   Total Protein 6.1 (L) 6.5 - 8.1 g/dL   Albumin 3.6 3.5 - 5.0 g/dL   AST 27 15 - 41 U/L   ALT 31 0 - 44 U/L   Alkaline Phosphatase 56 38 - 126 U/L   Total Bilirubin 0.5 0.3 - 1.2 mg/dL   GFR calc non Af Amer >60 >60 mL/min   GFR calc Af Amer >60 >60 mL/min   Anion gap 14 5 - 15    Comment: Performed at [facility], [place] [address] [place], [place] [address]  CBG monitoring, ED     Status: Abnormal   Collection Time: 04/16/19  6:05 AM  Result Value Ref Range   Glucose-Capillary 142 (H) 70 - 99 mg/dL    Comment: Glucose reference range applies only to samples taken after fasting for at least 8 hours.   DG Cervical Spine With Flex & Extend  Result Date: 04/16/2019 CLINICAL DATA:  Bilateral hand numbness. EXAM: CERVICAL SPINE COMPLETE WITH FLEXION AND EXTENSION VIEWS COMPARISON:  MRI cervical spine from same day. FINDINGS: The lateral view is diagnostic to the C7 level. There is no acute fracture or subluxation. Vertebral body heights are preserved. Trace anterolisthesis at C3-C4 with flexion. Alignment is otherwise normal. Moderate to severe disc height loss at C5-C6 and C6-C7. Mild disc height loss at C4-C5. Mild right C4-C5 and moderate left C5-C6 and C6-C7 neuroforaminal stenosis due to uncovertebral hypertrophy. Normal prevertebral soft tissues. IMPRESSION: 1. Trace anterolisthesis at C3-C4 with flexion. 2. Multilevel cervical spondylosis as described above, greatest at C5-C6 and C6-C7. Electronically Signed   By: Titus Dubin M.D.   On: 04/16/2019 14:49   MR Cervical Spine Wo Contrast  Result Date: 04/16/2019 CLINICAL DATA:  Acute bilateral hand numbness and weakness. EXAM: MRI CERVICAL SPINE WITHOUT CONTRAST TECHNIQUE: Multiplanar, multisequence MR imaging of the cervical spine was performed. No intravenous contrast was administered. COMPARISON:  None. FINDINGS: Alignment: Physiologic. Vertebrae: No fracture, evidence of  discitis, or bone lesion. Cord: Tiny focal area abnormal signal in the cervical spinal cord centrally and to the left at C5-6 seen on images 17 and 18 of series 13, likely due to a small focal disc protrusion with accompanying osteophytes. Posterior Fossa, vertebral arteries, paraspinal tissues: Negative. Disc levels: C2-3: Normal. C3-4: Tiny  central disc bulge with no neural impingement. Moderate left facet arthritis. No foraminal stenosis. C4-5: Broad-based disc osteophyte complex asymmetric to the right encroaching upon the right lateral recess and right neural foramen which could affect the right C5 nerve. No significant facet arthritis. C5-6: Disc space narrowing. Broad-based disc osteophyte complex asymmetric to the left. This compresses the ventral aspect of the left side of the spinal cord with slight myelopathy in the cord at that level. This could affect the left C6 nerve. Severe left foraminal stenosis. Moderate right facet arthritis. C6-7: Disc space narrowing. Prominent broad-based disc osteophyte complex slightly asymmetric to the right. Bilateral foraminal stenosis which could affect either or both C7 nerves. C7-T1: Tiny disc bulge to the left of midline without neural impingement. No foraminal stenosis. T1-2: Focal disc protrusion to the right of midline into the right lateral recess and right neural foramen which could affect the right T1 nerve. IMPRESSION: 1. Soft disc protrusion at T1-2 to the right which could affect the right T1 nerve. 2. Right lateral recess and right foraminal impingement at C4-5 which could affect the right C5 nerve. 3. Left lateral recess and foraminal stenosis at C5-6 which could affect the left C6 nerve. 4. Bilateral foraminal stenosis at C6-7 which could affect either or both C7 nerves. 5. 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 Electronically Signed   By: Lorriane Shire M.D.   On: 04/16/2019 12:06    Review of Systems   Constitutional: Positive for activity change.  HENT: Positive for congestion.   Eyes: Negative.   Respiratory: Negative.   Cardiovascular: Negative.   Gastrointestinal: Negative.   Endocrine: Negative.   Genitourinary: Negative.   Musculoskeletal: Positive for back pain.  Allergic/Immunologic: Negative.   Neurological: Positive for weakness.  Hematological: Negative.   Psychiatric/Behavioral: Negative.     Blood pressure 133/63, pulse 71, temperature 98 F (36.7 C), temperature source Oral, resp. rate 20, SpO2 99 %. Physical Exam  Constitutional: He is oriented to person, place, and time. He appears well-developed and well-nourished.  HENT:  Head: Normocephalic and atraumatic.  Eyes: Pupils are equal, round, and reactive to light. Conjunctivae are normal.  Cardiovascular: Normal rate and regular rhythm.  Respiratory: Effort normal and breath sounds normal.  GI: Soft. Bowel sounds are normal.  Musculoskeletal:     Cervical back: Normal range of motion and neck supple.  Neurological: He is alert and oriented to person, place, and time.  Absent reflexes in the biceps and triceps trace reflexes in the patellae absent reflexes in the Achilles.  Strength exam features good strength in the deltoids 4+ out of 5 strength in both biceps 4- out of 5 strength in the triceps wrist extensors have 4 out of 5 strength finger extensors are 2 out of 5 bilaterally intrinsics are 0 out of 5.  Grip strength is 4- out of 5 bilaterally lower extremity strength is 5 out of 5 in the major groups.  Sensation appears intact to pin light touch in both upper extremities and the lower extremities.  Cranial nerve examination is normal.  Skin: Skin is warm and dry.  Psychiatric: He has a normal mood and affect. His behavior is normal. Judgment and thought content normal.     Assessment/Plan Cervical spondylosis with acute weakness in the C6 and C7 distributions.  No history of trauma.  Small cord contusion with  very peculiar picture not completely consistent with a central cord injury as there are no  sensory findings.  Plan: Admit for observation, reevaluate in the morning, I have discussed the situation with Dr. Cheral Marker from neurology and asked him to see and evaluate the patient to see if he has any other thoughts as to what may be the etiology of his acute weakness.  Will defer the use of any steroids as the patient does have a history of diabetes and does not have acute or active cord compression present.  Earleen Newport, MD 04/16/2019, 6:05 PM

## 2019-04-16 NOTE — ED Notes (Signed)
Pt wheeled to the bathroom; able to use bathroom unassisted.

## 2019-04-16 NOTE — ED Triage Notes (Signed)
Pt employeed at [employer], reports starting work & feeling like bilateral shoulders, elbows, wrists are feeling weak & felt flush. RN upstairs stroke screen negative. Pt A&Ox4, no droop, slur or other obvious deficit noted. No other complaints at this time.

## 2019-04-16 NOTE — ED Triage Notes (Signed)
Pt presents from work for bilateral hand numbness, dry mouth,  and feeling flushed. Was seen in ED earlier this AM for the same, did not become diaphoretic during this episode. No drift to ext but is unable to grip with either hand.   Only new med a potassium suppl, electrolytes WNL at AM lab draw.   Denies CP, SOB. H/o DM, CAD, HTN, CA.

## 2019-04-17 ENCOUNTER — Observation Stay (HOSPITAL_COMMUNITY): Payer: 59

## 2019-04-17 ENCOUNTER — Encounter (HOSPITAL_COMMUNITY): Payer: Self-pay | Admitting: Neurological Surgery

## 2019-04-17 DIAGNOSIS — R531 Weakness: Secondary | ICD-10-CM | POA: Diagnosis not present

## 2019-04-17 LAB — HIV ANTIBODY (ROUTINE TESTING W REFLEX): HIV Screen 4th Generation wRfx: NONREACTIVE

## 2019-04-17 LAB — CBC
HCT: 38.8 % — ABNORMAL LOW (ref 39.0–52.0)
Hemoglobin: 12.6 g/dL — ABNORMAL LOW (ref 13.0–17.0)
MCH: 30.1 pg (ref 26.0–34.0)
MCHC: 32.5 g/dL (ref 30.0–36.0)
MCV: 92.6 fL (ref 80.0–100.0)
Platelets: 204 10*3/uL (ref 150–400)
RBC: 4.19 MIL/uL — ABNORMAL LOW (ref 4.22–5.81)
RDW: 14.4 % (ref 11.5–15.5)
WBC: 8.8 10*3/uL (ref 4.0–10.5)
nRBC: 0 % (ref 0.0–0.2)

## 2019-04-17 LAB — CREATININE, SERUM
Creatinine, Ser: 0.95 mg/dL (ref 0.61–1.24)
GFR calc Af Amer: >60 mL/min (ref >60)
GFR calc non Af Amer: >60 mL/min (ref >60)

## 2019-04-17 LAB — GLUCOSE, CAPILLARY
Glucose-Capillary: 118 mg/dL — ABNORMAL HIGH (ref 70–99)
Glucose-Capillary: 108 mg/dL — ABNORMAL HIGH (ref 70–99)
Glucose-Capillary: 97 mg/dL (ref 70–99)
Glucose-Capillary: 118 mg/dL — ABNORMAL HIGH (ref 70–99)

## 2019-04-17 LAB — CSF CELL COUNT WITH DIFFERENTIAL
RBC Count, CSF: 106 /mm3 — ABNORMAL HIGH
Tube #: 3
WBC, CSF: 1 /mm3 (ref 0–5)

## 2019-04-17 LAB — SARS CORONAVIRUS 2 (TAT 6-24 HRS): SARS Coronavirus 2: NEGATIVE

## 2019-04-17 LAB — GLUCOSE, CSF: Glucose, CSF: 67 mg/dL (ref 40–70)

## 2019-04-17 LAB — CK: Total CK: 93 U/L (ref 49–397)

## 2019-04-17 LAB — PROTEIN, CSF: Total  Protein, CSF: 66 mg/dL — ABNORMAL HIGH (ref 15–45)

## 2019-04-17 MED ORDER — ENOXAPARIN SODIUM 60 MG/0.6ML ~~LOC~~ SOLN: 60.0000 mg | SUBCUTANEOUS | Status: DC

## 2019-04-17 MED ORDER — GABAPENTIN 300 MG PO CAPS
300.0000 mg | ORAL_CAPSULE | Freq: Three times a day (TID) | ORAL | Status: DC
  Administered 2019-04-17 – 2019-04-23 (×19): 300 mg via ORAL
  Filled 2019-04-17 (×19): qty 1

## 2019-04-17 MED ORDER — ACETAMINOPHEN 500 MG PO TABS
1000.0000 mg | ORAL_TABLET | Freq: Four times a day (QID) | ORAL | Status: DC | PRN
  Administered 2019-04-21: 1000 mg via ORAL
  Filled 2019-04-17: qty 2

## 2019-04-17 MED ORDER — TRAZODONE HCL 50 MG PO TABS
25.0000 mg | ORAL_TABLET | Freq: Every evening | ORAL | Status: DC | PRN
  Administered 2019-04-22 (×2): 50 mg via ORAL
  Filled 2019-04-17 (×2): qty 1

## 2019-04-17 MED ORDER — AMLODIPINE BESYLATE 10 MG PO TABS
10.0000 mg | ORAL_TABLET | Freq: Every day | ORAL | Status: DC
  Administered 2019-04-17 – 2019-04-23 (×7): 10 mg via ORAL
  Filled 2019-04-17 (×7): qty 1

## 2019-04-17 MED ORDER — ASPIRIN EC 81 MG PO TBEC
81.0000 mg | DELAYED_RELEASE_TABLET | Freq: Every day | ORAL | Status: DC
  Administered 2019-04-17 – 2019-04-23 (×7): 81 mg via ORAL
  Filled 2019-04-17 (×7): qty 1

## 2019-04-17 MED ORDER — FENOFIBRATE 160 MG PO TABS
160.0000 mg | ORAL_TABLET | Freq: Every day | ORAL | Status: DC
  Administered 2019-04-17 – 2019-04-20 (×4): 160 mg via ORAL
  Filled 2019-04-17 (×4): qty 1

## 2019-04-17 MED ORDER — ALBUTEROL SULFATE (2.5 MG/3ML) 0.083% IN NEBU: 2.5000 mg | INHALATION_SOLUTION | Freq: Four times a day (QID) | RESPIRATORY_TRACT | Status: DC | PRN

## 2019-04-17 MED ORDER — LISINOPRIL 40 MG PO TABS
40.0000 mg | ORAL_TABLET | Freq: Every day | ORAL | Status: DC
  Administered 2019-04-17 – 2019-04-23 (×7): 40 mg via ORAL
  Filled 2019-04-17 (×7): qty 1

## 2019-04-17 MED ORDER — ROSUVASTATIN CALCIUM 5 MG PO TABS
5.0000 mg | ORAL_TABLET | Freq: Every day | ORAL | Status: DC
  Administered 2019-04-17 – 2019-04-23 (×7): 5 mg via ORAL
  Filled 2019-04-17 (×8): qty 1

## 2019-04-17 MED ORDER — ENOXAPARIN SODIUM 60 MG/0.6ML ~~LOC~~ SOLN
60.0000 mg | SUBCUTANEOUS | Status: DC
  Administered 2019-04-18 – 2019-04-23 (×6): 60 mg via SUBCUTANEOUS
  Filled 2019-04-17 (×6): qty 0.6

## 2019-04-17 MED ORDER — ALBUTEROL SULFATE HFA 108 (90 BASE) MCG/ACT IN AERS
2.0000 | INHALATION_SPRAY | Freq: Four times a day (QID) | RESPIRATORY_TRACT | Status: DC | PRN
  Filled 2019-04-17: qty 6.7

## 2019-04-17 MED ORDER — METOPROLOL SUCCINATE ER 100 MG PO TB24
100.0000 mg | ORAL_TABLET | Freq: Every day | ORAL | Status: DC
  Administered 2019-04-17 – 2019-04-23 (×7): 100 mg via ORAL
  Filled 2019-04-17 (×7): qty 1

## 2019-04-17 MED ORDER — LINAGLIPTIN 5 MG PO TABS
5.0000 mg | ORAL_TABLET | Freq: Every day | ORAL | Status: DC
  Administered 2019-04-18 – 2019-04-23 (×6): 5 mg via ORAL
  Filled 2019-04-17 (×7): qty 1

## 2019-04-17 MED ORDER — METFORMIN HCL 500 MG PO TABS
1000.0000 mg | ORAL_TABLET | Freq: Two times a day (BID) | ORAL | Status: DC
  Administered 2019-04-17 – 2019-04-23 (×13): 1000 mg via ORAL
  Filled 2019-04-17 (×14): qty 2

## 2019-04-17 MED ORDER — FUROSEMIDE 20 MG PO TABS: 20.0000 mg | ORAL_TABLET | Freq: Every day | ORAL | Status: DC | PRN

## 2019-04-17 MED ORDER — INSULIN ASPART 100 UNIT/ML ~~LOC~~ SOLN
0.0000 [IU] | Freq: Three times a day (TID) | SUBCUTANEOUS | Status: DC
  Administered 2019-04-18 – 2019-04-21 (×4): 2 [IU] via SUBCUTANEOUS

## 2019-04-17 MED ORDER — LIDOCAINE HCL (PF) 1 % IJ SOLN
5.0000 mL | Freq: Once | INTRAMUSCULAR | Status: AC
  Administered 2019-04-17: 14:00:00 5 mL via INTRADERMAL

## 2019-04-17 NOTE — Progress Notes (Signed)
Patient ID: Travis [name], male   DOB: Jun 22, 1955, 64 y.o.   MRN: [id number] Vital signs are stable  Neurologic exam is not changed much Appreciate Dr. Yvetta Coder input and plan for lumbar puncture At this point neurosurgically I am not planning any intervention I discussed transferring the patient to Dr. Cheral Marker service and he suggested that the hospitalist should take the patient on the service and he will act as Optometrist.  I will place consultation into the hospitalist service.

## 2019-04-17 NOTE — Progress Notes (Addendum)
MRI brain was negative for stroke. The patient will need his CSF evaluated for possible albumino-cytologic dissociation as GBS is relatively high on the DDx.   Bedside LP is not feasible due to combined morbid obesity and degenerative spondylosis. Will need fluoro-guided LP, which is being ordered. Labs as follows: Cell count with differential, protein, glucose, IgG index.   May be able to obtain fluoro-guided LP today as last Lovenox dose was yesterday. SCDs ordered. Plan is to restart Lovenox tomorrow.   Electronically signed: Dr. Kerney Elbe

## 2019-04-17 NOTE — Plan of Care (Signed)
TRH to assume care on 4/3 from Dr. Ellene Route.  See TRH communication for further details.

## 2019-04-17 NOTE — ED Notes (Signed)
Report given to [facility], RN on [facility]. All questions answered.

## 2019-04-18 ENCOUNTER — Observation Stay (HOSPITAL_COMMUNITY): Payer: 59

## 2019-04-18 DIAGNOSIS — G4733 Obstructive sleep apnea (adult) (pediatric): Secondary | ICD-10-CM | POA: Diagnosis not present

## 2019-04-18 DIAGNOSIS — I10 Essential (primary) hypertension: Secondary | ICD-10-CM

## 2019-04-18 DIAGNOSIS — G610 Guillain-Barre syndrome: Secondary | ICD-10-CM | POA: Diagnosis not present

## 2019-04-18 DIAGNOSIS — R29898 Other symptoms and signs involving the musculoskeletal system: Secondary | ICD-10-CM | POA: Diagnosis not present

## 2019-04-18 DIAGNOSIS — E785 Hyperlipidemia, unspecified: Secondary | ICD-10-CM | POA: Diagnosis not present

## 2019-04-18 DIAGNOSIS — M6281 Muscle weakness (generalized): Secondary | ICD-10-CM | POA: Diagnosis not present

## 2019-04-18 LAB — GLUCOSE, CAPILLARY
Glucose-Capillary: 132 mg/dL — ABNORMAL HIGH (ref 70–99)
Glucose-Capillary: 113 mg/dL — ABNORMAL HIGH (ref 70–99)
Glucose-Capillary: 119 mg/dL — ABNORMAL HIGH (ref 70–99)

## 2019-04-18 MED ORDER — IOHEXOL 350 MG/ML SOLN
75.0000 mL | Freq: Once | INTRAVENOUS | Status: AC | PRN
  Administered 2019-04-18: 75 mL via INTRAVENOUS

## 2019-04-18 MED ORDER — ADULT MULTIVITAMIN W/MINERALS CH
1.0000 | ORAL_TABLET | Freq: Every day | ORAL | Status: DC
  Administered 2019-04-18 – 2019-04-23 (×6): 1 via ORAL
  Filled 2019-04-18 (×6): qty 1

## 2019-04-18 MED ORDER — GLUCERNA SHAKE PO LIQD
237.0000 mL | Freq: Three times a day (TID) | ORAL | Status: DC
  Administered 2019-04-18 – 2019-04-23 (×14): 237 mL via ORAL

## 2019-04-18 NOTE — Progress Notes (Signed)
Pt stated he is able to place self on and off. RT set up machine at bedside and instructed pt on how to use machine. Pt informed to call if he needs help during the night

## 2019-04-18 NOTE — Progress Notes (Signed)
Initial Nutrition Assessment  DOCUMENTATION CODES:   Morbid obesity  INTERVENTION:  Glucerna Shake po TID, each supplement provides 220 kcal and 10 grams of protein  MVI with minerals daily   NUTRITION DIAGNOSIS:   Increased nutrient needs related to acute illness(cervical spondylosis) as evidenced by estimated needs.    GOAL:   Patient will meet greater than or equal to 90% of their needs   MONITOR:   PO intake, Weight trends, Skin, Labs, Supplement acceptance  REASON FOR ASSESSMENT:   Malnutrition Screening Tool    ASSESSMENT:  RD working remotely.  64 year old male with past medical history of morbid obesity, HLD, CAD s/p PCI (1997), diverticulosis of colon with hemorrhage (2009), edema, HTN, history of prostate cancer, DM2, thrombocytopenia, pulmonary HTN, OSA on CPAP who presented with complaints of being unable to open his hands very well on either side and bilateral arm weakness. MRI of neck showed cervical stenosis at C4-5 and small lesion at C5-6 consistent with small contusion or myelomalacia and admitted for observation of cervical spondylosis.  Per notes: -MRI brain negative for stroke -GBS high -fluoro-guided LP ordered -plans to restart Lovenox tomorrow  Patient is on a CM diet, no documented intakes for review at this time. Will provide Glucerna supplement to aid with estimated needs.   Current wt 326 lbs Per history, on 04/06/19 pt wt 339 lbs, on 02/03/19 pt wt 341 lbs, on 12/23/18 pt wt 339 lbs, and on 11/26/18 pt wt 328 lbs. This indicates a 13 lb (3.8%) wt loss in 2 weeks which is significant. Patient with history of edema per chart review, suspect fluctuations in weight related to fluid shifts.  Patient is morbidly obese and would benefit from weight loss, however obesity is a complex, chronic medical condition that is optimally managed by a multidisciplinary care team and weight loss is not an ideal goal for an acute inpatient hospitalization. If  further work-up for obesity is warranted, consider outpatient referral to outpatient bariatric service and/or [facility].   Medications reviewed and include: Gabapentin, SSI, Metformin  Labs: CBGs 113,132,118,97 x 24 hours   NUTRITION - FOCUSED PHYSICAL EXAM: Unable to complete at this time, RD working remotely.  Diet Order:   Diet Order            Diet Carb Modified Fluid consistency: Thin; Room service appropriate? Yes  Diet effective now              EDUCATION NEEDS:   No education needs have been identified at this time  Skin:  Skin Assessment: Skin Integrity Issues: Skin Integrity Issues:: Other (Comment) Other: MASD; abdomen,groin  Last BM:  4/1  Height:   Ht Readings from Last 1 Encounters:  04/17/19 5\' 9"  (1.753 m)    Weight:   Wt Readings from Last 1 Encounters:  04/17/19 (!) 148 kg    Ideal Body Weight:  72.7 kg  BMI:  Body mass index is 48.17 kg/m.  Estimated Nutritional Needs:   Kcal:  [id number]  Protein:  114-125  Fluid:  >/= 2.3 L/day   Lajuan Lines, RD, LDN Clinical Nutrition After Hours/Weekend Pager # in [place]

## 2019-04-18 NOTE — Progress Notes (Addendum)
Subjective: The patient feels that his weakness has improved since yesterday.   Objective: Current vital signs: BP 131/82 (BP Location: Right Arm)   Pulse 69   Temp (!) 97.5 F (36.4 C) (Oral)   Resp 18   Ht 5\' 9"  (1.753 m)   Wt (!) 148 kg   SpO2 97%   BMI 48.17 kg/m  Vital signs in last 24 hours: Temp:  [97.5 F (36.4 C)-97.9 F (36.6 C)] 97.5 F (36.4 C) (04/03 0603) Pulse Rate:  [68-72] 69 (04/03 0603) Resp:  [16-18] 18 (04/03 0603) BP: (121-136)/(67-82) 131/82 (04/03 0603) SpO2:  [96 %-98 %] 97 % (04/03 0603)  Intake/Output from previous day: No intake/output data recorded. Intake/Output this shift: No intake/output data recorded. Nutritional status:  Diet Order            Diet Carb Modified Fluid consistency: Thin; Room service appropriate? Yes  Diet effective now             HEENT: [place]/AT Lungs: Respirations unlabored Ext: No edema  Neurologic Exam: Ment: Alert and oriented. Speech fluent with intact comprehension. Good insight. Bright affect.  CN: Pupils equal. EOMI. No ptosis. Face symmetric without evidence for weakness. No hypophonia, nasal speech or hoarseness.  Motor: RUE: 4-/5 finger abduction and extension. 4/5 grip. 4/5 wrist flexion and extension. 4/5 biceps and triceps. 4+/5 deltoid. LUE:  4-/5 finger abduction and extension. 4/5 grip. 4/5 wrist flexion and extension. 4/5 biceps and triceps. 4+/5 deltoid. RLE 4/5 hip flexion and knee extension.  LLE: 4/5 hip flexion and knee extension. Deep Tendon Reflexes:  2+ brachioradialis and biceps bilaterally.  2+ patellae and achilles bilaterally.  Cerebellar: No ataxia noted.  Gait: No truncal instability when sitting on edge of bed.    Lab Results: Results for orders placed or performed during the hospital encounter of 04/16/19 (from the past 48 hour(s))  SARS CORONAVIRUS 2 (TAT 6-24 HRS) Nasopharyngeal Nasopharyngeal Swab     Status: None   Collection Time: 04/16/19  9:17 PM   Specimen:  Nasopharyngeal Swab  Result Value Ref Range   SARS Coronavirus 2 NEGATIVE NEGATIVE    Comment: (NOTE) SARS-CoV-2 target nucleic acids are NOT DETECTED. The SARS-CoV-2 RNA is generally detectable in upper and lower respiratory specimens during the acute phase of infection. Negative results do not preclude SARS-CoV-2 infection, do not rule out co-infections with other pathogens, and should not be used as the sole basis for treatment or other patient management decisions. Negative results must be combined with clinical observations, patient history, and epidemiological information. The expected result is Negative. Fact Sheet for Patients: SugarRoll.be Fact Sheet for Healthcare Providers: https://www.woods-mathews.com/ This test is not yet approved or cleared by the [place] FDA and  has been authorized for detection and/or diagnosis of SARS-CoV-2 by FDA under an Emergency Use Authorization (EUA). This EUA will remain  in effect (meaning this test can be used) for the duration of the COVID-19 declaration under Section 56 4(b)(1) of the Act, 21 U.S.C. section 360bbb-3(b)(1), unless the authorization is terminated or revoked sooner. Performed at [facility], [place] [address], [place], [place] [address]   CK     Status: None   Collection Time: 04/17/19 12:11 AM  Result Value Ref Range   Total CK 93 49 - 397 U/L    Comment: Performed at [facility], [place] [address], [place], [place] [address]  HIV Antibody (routine testing w rflx)     Status: None   Collection  Time: 04/17/19 12:47 AM  Result Value Ref Range   HIV Screen 4th Generation wRfx NON REACTIVE NON REACTIVE    Comment: Performed at [facility] [address] [place], [place] [address]  CBC     Status: Abnormal   Collection Time: 04/17/19 12:47 AM  Result Value Ref Range   WBC 8.8 4.0 - 10.5 K/uL   RBC 4.19 (L) 4.22 - 5.81 MIL/uL   Hemoglobin 12.6 (L) 13.0 -  17.0 g/dL   HCT 38.8 (L) 39.0 - 52.0 %   MCV 92.6 80.0 - 100.0 fL   MCH 30.1 26.0 - 34.0 pg   MCHC 32.5 30.0 - 36.0 g/dL   RDW 14.4 11.5 - 15.5 %   Platelets 204 150 - 400 K/uL   nRBC 0.0 0.0 - 0.2 %    Comment: Performed at [facility], [place] [address] [place], [place] [address]  Creatinine, serum     Status: None   Collection Time: 04/17/19 12:47 AM  Result Value Ref Range   Creatinine, Ser 0.95 0.61 - 1.24 mg/dL   GFR calc non Af Amer >60 >60 mL/min   GFR calc Af Amer >60 >60 mL/min    Comment: Performed at [facility] [address], [place], [place] [address]  Glucose, capillary     Status: Abnormal   Collection Time: 04/17/19  6:23 AM  Result Value Ref Range   Glucose-Capillary 118 (H) 70 - 99 mg/dL    Comment: Glucose reference range applies only to samples taken after fasting for at least 8 hours.  Glucose, capillary     Status: Abnormal   Collection Time: 04/17/19 11:01 AM  Result Value Ref Range   Glucose-Capillary 108 (H) 70 - 99 mg/dL    Comment: Glucose reference range applies only to samples taken after fasting for at least 8 hours.  Glucose, CSF     Status: None   Collection Time: 04/17/19  2:24 PM  Result Value Ref Range   Glucose, CSF 67 40 - 70 mg/dL    Comment: Performed at [facility] [address], [place], [place] [address]  Protein, CSF     Status: Abnormal   Collection Time: 04/17/19  2:24 PM  Result Value Ref Range   Total  Protein, CSF 66 (H) 15 - 45 mg/dL    Comment: Performed at [facility] [address] [place], [place] [address]  CSF cell count with differential     Status: Abnormal   Collection Time: 04/17/19  2:24 PM  Result Value Ref Range   Tube # 3    Color, CSF COLORLESS COLORLESS   Appearance, CSF CLEAR CLEAR   Supernatant NOT INDICATED    RBC Count, CSF 106 (H) 0 /cu mm   WBC, CSF 1 0 - 5 /cu mm   Other Cells, CSF TOO FEW TO COUNT, SMEAR AVAILABLE FOR REVIEW     Comment: RARE LYMPOHCYTES, RARE  MONOCYTES Performed at [facility] [address], [place], [place] [address]   Glucose, capillary     Status: None   Collection Time: 04/17/19  4:38 PM  Result Value Ref Range   Glucose-Capillary 97 70 - 99 mg/dL    Comment: Glucose reference range applies only to samples taken after fasting for at least 8 hours.   Comment 1 Notify RN    Comment 2 Document in Chart   Glucose, capillary     Status:  Abnormal   Collection Time: 04/17/19  9:42 PM  Result Value Ref Range   Glucose-Capillary 118 (H) 70 - 99 mg/dL    Comment: Glucose reference range applies only to samples taken after fasting for at least 8 hours.  Glucose, capillary     Status: Abnormal   Collection Time: 04/18/19  6:07 AM  Result Value Ref Range   Glucose-Capillary 132 (H) 70 - 99 mg/dL    Comment: Glucose reference range applies only to samples taken after fasting for at least 8 hours.    Recent Results (from the past 240 hour(s))  SARS CORONAVIRUS 2 (TAT 6-24 HRS) Nasopharyngeal Nasopharyngeal Swab     Status: None   Collection Time: 04/16/19  9:17 PM   Specimen: Nasopharyngeal Swab  Result Value Ref Range Status   SARS Coronavirus 2 NEGATIVE NEGATIVE Final    Comment: (NOTE) SARS-CoV-2 target nucleic acids are NOT DETECTED. The SARS-CoV-2 RNA is generally detectable in upper and lower respiratory specimens during the acute phase of infection. Negative results do not preclude SARS-CoV-2 infection, do not rule out co-infections with other pathogens, and should not be used as the sole basis for treatment or other patient management decisions. Negative results must be combined with clinical observations, patient history, and epidemiological information. The expected result is Negative. Fact Sheet for Patients: SugarRoll.be Fact Sheet for Healthcare Providers: https://www.woods-mathews.com/ This test is not yet approved or cleared by the [place] FDA and  has  been authorized for detection and/or diagnosis of SARS-CoV-2 by FDA under an Emergency Use Authorization (EUA). This EUA will remain  in effect (meaning this test can be used) for the duration of the COVID-19 declaration under Section 56 4(b)(1) of the Act, 21 U.S.C. section 360bbb-3(b)(1), unless the authorization is terminated or revoked sooner. Performed at [facility], [place] [address], [place], [place] [address]     Lipid Panel No results for input(s): CHOL, TRIG, HDL, CHOLHDL, VLDL, LDLCALC in the last 72 hours.  Studies/Results: DG Cervical Spine With Flex & Extend  Result Date: 04/16/2019 CLINICAL DATA:  Bilateral hand numbness. EXAM: CERVICAL SPINE COMPLETE WITH FLEXION AND EXTENSION VIEWS COMPARISON:  MRI cervical spine from same day. FINDINGS: The lateral view is diagnostic to the C7 level. There is no acute fracture or subluxation. Vertebral body heights are preserved. Trace anterolisthesis at C3-C4 with flexion. Alignment is otherwise normal. Moderate to severe disc height loss at C5-C6 and C6-C7. Mild disc height loss at C4-C5. Mild right C4-C5 and moderate left C5-C6 and C6-C7 neuroforaminal stenosis due to uncovertebral hypertrophy. Normal prevertebral soft tissues. IMPRESSION: 1. Trace anterolisthesis at C3-C4 with flexion. 2. Multilevel cervical spondylosis as described above, greatest at C5-C6 and C6-C7. Electronically Signed   By: Titus Dubin M.D.   On: 04/16/2019 14:49   MR BRAIN WO CONTRAST  Result Date: 04/16/2019 CLINICAL DATA:  Sudden onset bilateral hand weakness EXAM: MRI HEAD WITHOUT CONTRAST TECHNIQUE: Multiplanar, multiecho pulse sequences of the brain and surrounding structures were obtained without intravenous contrast. COMPARISON:  None. FINDINGS: BRAIN: No acute infarct, acute hemorrhage or extra-axial collection. Normal white matter signal for age. Normal volume of brain parenchyma and CSF spaces. Midline structures are normal. VASCULAR: Major flow voids  are preserved. Susceptibility-sensitive sequences show no chronic microhemorrhage or superficial siderosis. SKULL AND UPPER CERVICAL SPINE: Normal calvarium and skull base. Visualized upper cervical spine and soft tissues are normal. SINUSES/ORBITS: No paranasal sinus fluid levels or advanced mucosal thickening. No mastoid or middle ear effusion. Normal  orbits. IMPRESSION: Normal brain MRI. Electronically Signed   By: Ulyses Jarred M.D.   On: 04/16/2019 23:35   MR Cervical Spine Wo Contrast  Result Date: 04/16/2019 CLINICAL DATA:  Acute bilateral hand numbness and weakness. EXAM: MRI CERVICAL SPINE WITHOUT CONTRAST TECHNIQUE: Multiplanar, multisequence MR imaging of the cervical spine was performed. No intravenous contrast was administered. COMPARISON:  None. FINDINGS: Alignment: Physiologic. Vertebrae: No fracture, evidence of discitis, or bone lesion. Cord: Tiny focal area abnormal signal in the cervical spinal cord centrally and to the left at C5-6 seen on images 17 and 18 of series 13, likely due to a small focal disc protrusion with accompanying osteophytes. Posterior Fossa, vertebral arteries, paraspinal tissues: Negative. Disc levels: C2-3: Normal. C3-4: Tiny central disc bulge with no neural impingement. Moderate left facet arthritis. No foraminal stenosis. C4-5: Broad-based disc osteophyte complex asymmetric to the right encroaching upon the right lateral recess and right neural foramen which could affect the right C5 nerve. No significant facet arthritis. C5-6: Disc space narrowing. Broad-based disc osteophyte complex asymmetric to the left. This compresses the ventral aspect of the left side of the spinal cord with slight myelopathy in the cord at that level. This could affect the left C6 nerve. Severe left foraminal stenosis. Moderate right facet arthritis. C6-7: Disc space narrowing. Prominent broad-based disc osteophyte complex slightly asymmetric to the right. Bilateral foraminal stenosis which  could affect either or both C7 nerves. C7-T1: Tiny disc bulge to the left of midline without neural impingement. No foraminal stenosis. T1-2: Focal disc protrusion to the right of midline into the right lateral recess and right neural foramen which could affect the right T1 nerve. IMPRESSION: 1. Soft disc protrusion at T1-2 to the right which could affect the right T1 nerve. 2. Right lateral recess and right foraminal impingement at C4-5 which could affect the right C5 nerve. 3. Left lateral recess and foraminal stenosis at C5-6 which could affect the left C6 nerve. 4. Bilateral foraminal stenosis at C6-7 which could affect either or both C7 nerves. 5. 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 Electronically Signed   By: Lorriane Shire M.D.   On: 04/16/2019 12:06   DG FL GUIDED LUMBAR PUNCTURE  Result Date: 04/17/2019 CLINICAL DATA:  Arm weakness EXAM: DIAGNOSTIC LUMBAR PUNCTURE UNDER FLUOROSCOPIC GUIDANCE FLUOROSCOPY TIME:  48 seconds; 199  uGym2 DAP PROCEDURE: Informed consent was obtained from the patient prior to the procedure, including potential complications of headache, allergy, and pain. Operators: Woodward Ku With the patient prone, the lower back was prepped with Betadine and chlorhexidine. 1% Lidocaine was used for local anesthesia. Lumbar puncture was performed at the L3 level from a left parasagittal approach using a 5 inch 20 gauge needle with return of clear colorless CSF with an opening pressure of 13 cm water. 7 ml of CSF were obtained for laboratory studies. The patient tolerated the procedure well and there were no apparent complications. IMPRESSION: Technically successful lumbar puncture under fluoroscopy. Electronically Signed   By: Lucrezia Europe M.D.   On: 04/17/2019 14:40    Medications:  Scheduled: . amLODipine  10 mg Oral Daily  . aspirin EC  81 mg Oral Daily  . enoxaparin (LOVENOX) injection  60 mg Subcutaneous Q24H  . fenofibrate  160 mg Oral  Daily  . gabapentin  300 mg Oral TID  . insulin aspart  0-15 Units Subcutaneous TID WC  . linagliptin  5 mg Oral Daily  . lisinopril  40 mg Oral Daily  . metFORMIN  1,000 mg Oral BID WC  . metoprolol succinate  100 mg Oral Daily  . rosuvastatin  5 mg Oral Daily    Assessment: 64 year old male with new onset of BUE and BLE weakness. Exam has significantly improved today relative to yesterday without any treatment.  1. Motor strength significantly improved since yesterday in BUE and essentially unchanged in BLE. Reflexes are normal in BLE and BUE, with upper extremity reflexes now brisker than yesterday.  2. MRI c-spine with equivocal lesions at C5-6 per official Radiology report. On further review by the cervical spine MRI by the Neurologist, the hyperintensities in the cord appear more consistent with artifact. Overall clinical picture is not consistent with a compressive myelopathy.  3. MRI brain has ruled out stroke.  4. CK level was normal.  5. 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 Of note, in the early stages of GBS, reflexes may be preserved.  6. Although felt to be unlikely, there is a remote possibility of brainstem hypoperfusion as the etiology for the patient's presentation with symmetric weakness. Will therefore rule out vertebrobasilar insufficiency or partially occlusive thrombus with CTA of head and neck.   Recommendations: 1. Inpatient PT and OT.  2. Continue to monitor for improvement in his strength. If it continues to improve by tomorrow, consider discharge home with outpatient Neurology follow up.  3. CTA of head and neck. 4. Discussed with Dr. [place] Pink.    LOS: 0 days   @Electronically  signed: Dr. Kerney Elbe 04/18/2019  8:59 AM

## 2019-04-18 NOTE — Progress Notes (Signed)
PROGRESS NOTE  Travis [name] [id number] DOB: 1955-06-24 DOA: 04/16/2019 PCP: [name], MD  HPI/Recap of past 58 hours: 64 year old male, employee of the health system, who was in his usual state of health on morning of 4/1 when at work, he suddenly noticed that both his hands had contracted and he could not straighten them out.  He initially denied have any type of grip strength or proximal arm weakness or any lower extremity weakness, but went to the emergency room to be evaluated.  Symptoms resolved and patient was discharged from the ER, but then returned back that same day when symptoms recurred.  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  Patient initially assessed by neurosurgery, but felt that there was no need for neurosurgical intervention as areas did not seem to coordinate with patient's symptoms.  Neurology consulted neurology felt that cord lesions appear to be more artifact and.  In further discussion with the patient, it was found that he had a short spell of bilateral foot paresthesias that had spontaneously resolved.  No sensory symptoms.  He stated that he had a brief illness approximately 2 weeks prior.  No recent vaccination.  With these findings, concern was for possible Guillain-Barr syndrome.  Patient was brought in and admitted to the hospitalist service.  He underwent a lumbar puncture by fluoroscopy whereby proteins were noted on his LP.  Neurology went to see patient today with possible plans to start IVIG, but interestingly, patient's symptoms appear to have already improved on their own and while he still has bilateral upper extremity weakness, it has improved from the previous day as per neurology.  Neurology recommended continuing to monitor patient closely.  Assessment/Plan: Principal Problem:   Upper extremity weakness: Symptoms persisting although slight  improvement from previous day.  It is possible this could be atypical Guillain-Barr versus another type of viral induced myelopathy.  As per neurology recommendations, holding off on IVIG at this time.  Continue to monitor in the hospital to ensure that he has no decline in status.  Neurology also wants to rule out any vertebrobasilar artery issue.  Plan to get CT angio of the head and neck Active Problems:   Diabetes [facility]): Continue home medications plus sliding scale.  CBG stable during his hospitalization    Hypertension: Continue home medications.    Morbid obesity ([place]): Meets criteria BMI greater than 40.    Dyslipidemia: Continue statin   Obstructive sleep apnea: On nightly CPAP, will start here as well   Code Status: Full code  Family Communication: Left message for wife  Disposition Plan: Potential discharge in next few days once symptoms have improved and cleared by neurology   Consultants:  Neurology  Neurosurgery  Procedures:  Fluoroscopy guided LP done 4/1  Antimicrobials:  None  DVT prophylaxis: SCDs   Objective: Vitals:   04/18/19 0603 04/18/19 1221  BP: 131/82 116/63  Pulse: 69 70  Resp: 18 18  Temp: (!) 97.5 F (36.4 C) 98.2 F (36.8 C)  SpO2: 97% 98%   No intake or output data in the 24 hours ending 04/18/19 1611 Filed Weights   04/17/19 0403  Weight: (!) 148 kg   Body mass index is 48.17 kg/m.  Exam:   General: Alert and oriented x3, no acute distress  Cardiovascular: Regular rate and rhythm, S1-S2  Respiratory: Clear to auscultation bilaterally  Abdomen: Soft obese, nontender, positive  bowel sounds  Musculoskeletal: No clubbing or cyanosis or edema  Skin: No skin breaks, tears or lesions  Psychiatry: Appropriate, no evidence of psychoses  Neuro: Bilateral grip/flexion and extension at the elbow symmetrically diminished 5-/5, but shoulder strength equal 5/5   Data Reviewed: CBC: Recent Labs  Lab 04/16/19 0556  04/17/19 0047  WBC 6.2 8.8  HGB 12.2* 12.6*  HCT 37.6* 38.8*  MCV 93.3 92.6  PLT 202 0000000   Basic Metabolic Panel: Recent Labs  Lab 04/16/19 0556 04/17/19 0047  NA 145  --   K 4.0  --   CL 105  --   CO2 26  --   GLUCOSE 138*  --   BUN 26*  --   CREATININE 1.07 0.95  CALCIUM 9.5  --    GFR: Estimated Creatinine Clearance: 115.9 mL/min (by C-G formula based on SCr of 0.95 mg/dL). Liver Function Tests: Recent Labs  Lab 04/16/19 0556  AST 27  ALT 31  ALKPHOS 56  BILITOT 0.5  PROT 6.1*  ALBUMIN 3.6   No results for input(s): LIPASE, AMYLASE in the last 168 hours. No results for input(s): AMMONIA in the last 168 hours. Coagulation Profile: No results for input(s): INR, PROTIME in the last 168 hours. Cardiac Enzymes: Recent Labs  Lab 04/17/19 0011  CKTOTAL 93   BNP (last 3 results) No results for input(s): PROBNP in the last 8760 hours. HbA1C: No results for input(s): HGBA1C in the last 72 hours. CBG: Recent Labs  Lab 04/17/19 1101 04/17/19 1638 04/17/19 2142 04/18/19 0607 04/18/19 1100  GLUCAP 108* 97 118* 132* 113*   Lipid Profile: No results for input(s): CHOL, HDL, LDLCALC, TRIG, CHOLHDL, LDLDIRECT in the last 72 hours. Thyroid Function Tests: No results for input(s): TSH, T4TOTAL, FREET4, T3FREE, THYROIDAB in the last 72 hours. Anemia Panel: No results for input(s): VITAMINB12, FOLATE, FERRITIN, TIBC, IRON, RETICCTPCT in the last 72 hours. Urine analysis:    Component Value Date/Time   LABSPEC 1.015 01/06/2012 1702   PHURINE 6.5 01/06/2012 1702   GLUCOSEU NEGATIVE 01/06/2012 1702   HGBUR LARGE (A) 01/06/2012 1702   BILIRUBINUR negative 02/19/2015 1110   KETONESUR negative 02/19/2015 1110   KETONESUR NEGATIVE 01/06/2012 1702   PROTEINUR negative 02/19/2015 1110   PROTEINUR 100 (A) 01/06/2012 1702   UROBILINOGEN 1.0 02/19/2015 1110   UROBILINOGEN 1.0 01/06/2012 1702   NITRITE Negative 02/19/2015 1110   NITRITE NEGATIVE 01/06/2012 1702    LEUKOCYTESUR Negative 02/19/2015 1110   Sepsis Labs: @LABRCNTIP (procalcitonin:4,lacticidven:4)  ) Recent Results (from the past 240 hour(s))  SARS CORONAVIRUS 2 (TAT 6-24 HRS) Nasopharyngeal Nasopharyngeal Swab     Status: None   Collection Time: 04/16/19  9:17 PM   Specimen: Nasopharyngeal Swab  Result Value Ref Range Status   SARS Coronavirus 2 NEGATIVE NEGATIVE Final    Comment: (NOTE) SARS-CoV-2 target nucleic acids are NOT DETECTED. The SARS-CoV-2 RNA is generally detectable in upper and lower respiratory specimens during the acute phase of infection. Negative results do not preclude SARS-CoV-2 infection, do not rule out co-infections with other pathogens, and should not be used as the sole basis for treatment or other patient management decisions. Negative results must be combined with clinical observations, patient history, and epidemiological information. The expected result is Negative. Fact Sheet for Patients: SugarRoll.be Fact Sheet for Healthcare Providers: https://www.woods-mathews.com/ This test is not yet approved or cleared by the [place] FDA and  has been authorized for detection and/or diagnosis of SARS-CoV-2 by FDA under an Emergency Use  Authorization (EUA). This EUA will remain  in effect (meaning this test can be used) for the duration of the COVID-19 declaration under Section 56 4(b)(1) of the Act, 21 U.S.C. section 360bbb-3(b)(1), unless the authorization is terminated or revoked sooner. Performed at [facility], [place] [address] [place], [place] [address]       Studies: No results found.  Scheduled Meds: . amLODipine  10 mg Oral Daily  . aspirin EC  81 mg Oral Daily  . enoxaparin (LOVENOX) injection  60 mg Subcutaneous Q24H  . feeding supplement (GLUCERNA SHAKE)  237 mL Oral TID BM  . fenofibrate  160 mg Oral Daily  . gabapentin  300 mg Oral TID  . insulin aspart  0-15 Units Subcutaneous TID  WC  . linagliptin  5 mg Oral Daily  . lisinopril  40 mg Oral Daily  . metFORMIN  1,000 mg Oral BID WC  . metoprolol succinate  100 mg Oral Daily  . multivitamin with minerals  1 tablet Oral Daily  . rosuvastatin  5 mg Oral Daily    Continuous Infusions:   LOS: 0 days     Annita Brod, MD [facility]   04/18/2019, 4:11 PM

## 2019-04-19 DIAGNOSIS — M47812 Spondylosis without myelopathy or radiculopathy, cervical region: Secondary | ICD-10-CM | POA: Diagnosis present

## 2019-04-19 DIAGNOSIS — Z8546 Personal history of malignant neoplasm of prostate: Secondary | ICD-10-CM | POA: Diagnosis not present

## 2019-04-19 DIAGNOSIS — Z955 Presence of coronary angioplasty implant and graft: Secondary | ICD-10-CM | POA: Diagnosis not present

## 2019-04-19 DIAGNOSIS — G8250 Quadriplegia, unspecified: Secondary | ICD-10-CM | POA: Diagnosis not present

## 2019-04-19 DIAGNOSIS — G4733 Obstructive sleep apnea (adult) (pediatric): Secondary | ICD-10-CM | POA: Diagnosis not present

## 2019-04-19 DIAGNOSIS — I5032 Chronic diastolic (congestive) heart failure: Secondary | ICD-10-CM | POA: Diagnosis not present

## 2019-04-19 DIAGNOSIS — I10 Essential (primary) hypertension: Secondary | ICD-10-CM | POA: Diagnosis not present

## 2019-04-19 DIAGNOSIS — M479 Spondylosis, unspecified: Secondary | ICD-10-CM | POA: Diagnosis not present

## 2019-04-19 DIAGNOSIS — Z7984 Long term (current) use of oral hypoglycemic drugs: Secondary | ICD-10-CM | POA: Diagnosis not present

## 2019-04-19 DIAGNOSIS — Z6841 Body Mass Index (BMI) 40.0 and over, adult: Secondary | ICD-10-CM | POA: Diagnosis not present

## 2019-04-19 DIAGNOSIS — Z833 Family history of diabetes mellitus: Secondary | ICD-10-CM | POA: Diagnosis not present

## 2019-04-19 DIAGNOSIS — E785 Hyperlipidemia, unspecified: Secondary | ICD-10-CM | POA: Diagnosis not present

## 2019-04-19 DIAGNOSIS — I272 Pulmonary hypertension, unspecified: Secondary | ICD-10-CM | POA: Diagnosis present

## 2019-04-19 DIAGNOSIS — G610 Guillain-Barre syndrome: Secondary | ICD-10-CM | POA: Diagnosis present

## 2019-04-19 DIAGNOSIS — R29898 Other symptoms and signs involving the musculoskeletal system: Secondary | ICD-10-CM | POA: Diagnosis not present

## 2019-04-19 DIAGNOSIS — E1165 Type 2 diabetes mellitus with hyperglycemia: Secondary | ICD-10-CM | POA: Diagnosis not present

## 2019-04-19 DIAGNOSIS — E119 Type 2 diabetes mellitus without complications: Secondary | ICD-10-CM | POA: Diagnosis not present

## 2019-04-19 DIAGNOSIS — I2510 Atherosclerotic heart disease of native coronary artery without angina pectoris: Secondary | ICD-10-CM | POA: Diagnosis not present

## 2019-04-19 DIAGNOSIS — Z20822 Contact with and (suspected) exposure to covid-19: Secondary | ICD-10-CM | POA: Diagnosis present

## 2019-04-19 DIAGNOSIS — E1142 Type 2 diabetes mellitus with diabetic polyneuropathy: Secondary | ICD-10-CM | POA: Diagnosis present

## 2019-04-19 DIAGNOSIS — R531 Weakness: Secondary | ICD-10-CM | POA: Diagnosis not present

## 2019-04-19 DIAGNOSIS — Z79899 Other long term (current) drug therapy: Secondary | ICD-10-CM | POA: Diagnosis not present

## 2019-04-19 DIAGNOSIS — M4802 Spinal stenosis, cervical region: Secondary | ICD-10-CM | POA: Diagnosis present

## 2019-04-19 DIAGNOSIS — I110 Hypertensive heart disease with heart failure: Secondary | ICD-10-CM | POA: Diagnosis present

## 2019-04-19 DIAGNOSIS — Z7982 Long term (current) use of aspirin: Secondary | ICD-10-CM | POA: Diagnosis not present

## 2019-04-19 LAB — GLUCOSE, CAPILLARY
Glucose-Capillary: 121 mg/dL — ABNORMAL HIGH (ref 70–99)
Glucose-Capillary: 101 mg/dL — ABNORMAL HIGH (ref 70–99)
Glucose-Capillary: 94 mg/dL (ref 70–99)
Glucose-Capillary: 119 mg/dL — ABNORMAL HIGH (ref 70–99)

## 2019-04-19 MED ORDER — IMMUNE GLOBULIN (HUMAN) 10 GM/100ML IV SOLN
400.0000 mg/kg | INTRAVENOUS | Status: AC
  Administered 2019-04-19 – 2019-04-23 (×5): 60 g via INTRAVENOUS
  Filled 2019-04-19 (×5): qty 600

## 2019-04-19 NOTE — Progress Notes (Signed)
Educated pt on care of abdominal folds; use of fungal powder and keeping areas clean

## 2019-04-19 NOTE — Progress Notes (Signed)
PROGRESS NOTE  Travis [name] [id number] DOB: 02/19/1955 DOA: 04/16/2019 PCP: [name], MD  HPI/Recap of past 39 hours: 64 year old male, employee of the health system, who was in his usual state of health on morning of 4/1 when at work, he suddenly noticed that both his hands had contracted and he could not straighten them out.  He initially denied have any type of grip strength or proximal arm weakness or any lower extremity weakness, but went to the emergency room to be evaluated.  Symptoms resolved and patient was discharged from the ER, but then returned back that same day when symptoms recurred.  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  Patient initially assessed by neurosurgery, but felt that there was no need for neurosurgical intervention as areas did not seem to coordinate with patient's symptoms.  Neurology consulted neurology felt that cord lesions appear to be more artifact and.  In further discussion with the patient, it was found that he had a short spell of bilateral foot paresthesias that had spontaneously resolved.  No sensory symptoms.  He stated that he had a brief illness approximately 2 weeks prior.  No recent vaccination.  With these findings, concern was for possible Guillain-Barr syndrome.  Patient was brought in and admitted to the hospitalist service.  He underwent a lumbar puncture by fluoroscopy whereby proteins were noted on his LP.  On 4/3, it was noted that patient symptoms appear to have improved from when he initially presented and neurology held off on IV immunoglobulin in hopes that his course would correct on its own.  However, by 4/4, patient's had not changed from the previous day.  While he had some mild improvement from when he first presented, he was still markedly weaker than his baseline.  At this point, neurology discussed with patient and he is amenable  to starting IVIG.  Patient otherwise doing okay with no complaints other than at times, he has some sciatic right leg pain intermittent, which is chronic.   Assessment/Plan: Principal Problem: Bilateral upper extremity weakness: Symptoms persisting although slight improvement from previous day.  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  IVIG started 4/4.  Neurology following.  CT angiogram of the head and neck unremarkable.  Active Problems:   Diabetes [facility]): Continue home medications plus sliding scale.  CBG stable during his hospitalization    Hypertension: Continue home medications.    Morbid obesity ([place]): Meets criteria BMI greater than 40.    Dyslipidemia: Continue statin   Obstructive sleep apnea: On nightly CPAP   Code Status: Full code  Family Communication: Sister at the bedside  Disposition Plan: Potential discharge in next few days once he has completed course of IVIG  Consultants:  Neurology  Neurosurgery  Procedures:  Fluoroscopy guided LP done 4/1  Antimicrobials:  None  DVT prophylaxis: SCDs   Objective: Vitals:   04/19/19 1350 04/19/19 1408  BP: 117/60 118/63  Pulse: 72 69  Resp: 18 18  Temp:    SpO2: 96% 97%   No intake or output data in the 24 hours ending 04/19/19 1505 Filed Weights   04/17/19 0403  Weight: (!) 148 kg   Body mass index is 48.17 kg/m.  Exam:   General: Alert and oriented x3, no acute distress  Cardiovascular: Regular rate and rhythm, S1-S2  Respiratory: Clear to auscultation bilaterally  Abdomen: Soft obese, nontender, positive bowel  sounds  Musculoskeletal: No clubbing or cyanosis or edema  Skin: No skin breaks, tears or lesions  Psychiatry: Appropriate, no evidence of psychoses  Neuro: Bilateral grip/flexion and extension at the elbow symmetrically diminished 5-/5, but shoulder strength equal 5/5   Data Reviewed: CBC: Recent Labs  Lab 04/16/19 0556 04/17/19 0047  WBC 6.2 8.8  HGB  12.2* 12.6*  HCT 37.6* 38.8*  MCV 93.3 92.6  PLT 202 0000000   Basic Metabolic Panel: Recent Labs  Lab 04/16/19 0556 04/17/19 0047  NA 145  --   K 4.0  --   CL 105  --   CO2 26  --   GLUCOSE 138*  --   BUN 26*  --   CREATININE 1.07 0.95  CALCIUM 9.5  --    GFR: Estimated Creatinine Clearance: 115.9 mL/min (by C-G formula based on SCr of 0.95 mg/dL). Liver Function Tests: Recent Labs  Lab 04/16/19 0556  AST 27  ALT 31  ALKPHOS 56  BILITOT 0.5  PROT 6.1*  ALBUMIN 3.6   No results for input(s): LIPASE, AMYLASE in the last 168 hours. No results for input(s): AMMONIA in the last 168 hours. Coagulation Profile: No results for input(s): INR, PROTIME in the last 168 hours. Cardiac Enzymes: Recent Labs  Lab 04/17/19 0011  CKTOTAL 93   BNP (last 3 results) No results for input(s): PROBNP in the last 8760 hours. HbA1C: No results for input(s): HGBA1C in the last 72 hours. CBG: Recent Labs  Lab 04/18/19 0607 04/18/19 1100 04/18/19 1611 04/19/19 0554 04/19/19 1103  GLUCAP 132* 113* 119* 121* 101*   Lipid Profile: No results for input(s): CHOL, HDL, LDLCALC, TRIG, CHOLHDL, LDLDIRECT in the last 72 hours. Thyroid Function Tests: No results for input(s): TSH, T4TOTAL, FREET4, T3FREE, THYROIDAB in the last 72 hours. Anemia Panel: No results for input(s): VITAMINB12, FOLATE, FERRITIN, TIBC, IRON, RETICCTPCT in the last 72 hours. Urine analysis:    Component Value Date/Time   LABSPEC 1.015 01/06/2012 1702   PHURINE 6.5 01/06/2012 1702   GLUCOSEU NEGATIVE 01/06/2012 1702   HGBUR LARGE (A) 01/06/2012 1702   BILIRUBINUR negative 02/19/2015 1110   KETONESUR negative 02/19/2015 1110   KETONESUR NEGATIVE 01/06/2012 1702   PROTEINUR negative 02/19/2015 1110   PROTEINUR 100 (A) 01/06/2012 1702   UROBILINOGEN 1.0 02/19/2015 1110   UROBILINOGEN 1.0 01/06/2012 1702   NITRITE Negative 02/19/2015 1110   NITRITE NEGATIVE 01/06/2012 1702   LEUKOCYTESUR Negative 02/19/2015 1110    Sepsis Labs: @LABRCNTIP (procalcitonin:4,lacticidven:4)  ) Recent Results (from the past 240 hour(s))  SARS CORONAVIRUS 2 (TAT 6-24 HRS) Nasopharyngeal Nasopharyngeal Swab     Status: None   Collection Time: 04/16/19  9:17 PM   Specimen: Nasopharyngeal Swab  Result Value Ref Range Status   SARS Coronavirus 2 NEGATIVE NEGATIVE Final    Comment: (NOTE) SARS-CoV-2 target nucleic acids are NOT DETECTED. The SARS-CoV-2 RNA is generally detectable in upper and lower respiratory specimens during the acute phase of infection. Negative results do not preclude SARS-CoV-2 infection, do not rule out co-infections with other pathogens, and should not be used as the sole basis for treatment or other patient management decisions. Negative results must be combined with clinical observations, patient history, and epidemiological information. The expected result is Negative. Fact Sheet for Patients: SugarRoll.be Fact Sheet for Healthcare Providers: https://www.woods-mathews.com/ This test is not yet approved or cleared by the [place] FDA and  has been authorized for detection and/or diagnosis of SARS-CoV-2 by FDA under an Emergency Use Authorization (  EUA). This EUA will remain  in effect (meaning this test can be used) for the duration of the COVID-19 declaration under Section 56 4(b)(1) of the Act, 21 U.S.C. section 360bbb-3(b)(1), unless the authorization is terminated or revoked sooner. Performed at [facility], [place] [address], [place],  [address]       Studies: CT ANGIO HEAD W OR WO CONTRAST  Result Date: 04/18/2019 CLINICAL DATA:  Upper extremity weakness EXAM: CT ANGIOGRAPHY HEAD AND NECK TECHNIQUE: Multidetector CT imaging of the head and neck was performed using the standard protocol during bolus administration of intravenous contrast. Multiplanar CT image reconstructions and MIPs were obtained to evaluate the vascular  anatomy. Carotid stenosis measurements (when applicable) are obtained utilizing NASCET criteria, using the distal internal carotid diameter as the denominator. CONTRAST:  58mL OMNIPAQUE IOHEXOL 350 MG/ML SOLN COMPARISON:  Correlation made with MRI brain 04/16/2019 FINDINGS: CT HEAD Brain: There is no acute intracranial hemorrhage, mass effect, or edema. Gray-white differentiation is preserved. There is no extra-axial fluid collection. Ventricles and sulci are within normal limits in size and configuration. Vascular: No hyperdense vessel or unexpected calcification. Skull: Calvarium is unremarkable. Sinuses/Orbits: No acute finding. Other: None. Review of the MIP images confirms the above findings CTA NECK Aortic arch: Great vessel origins are patent. Right carotid system: Patent. No measurable stenosis or evidence of dissection. Left carotid system: Patent. No measurable stenosis or evidence of dissection. Vertebral arteries: Patent.  Right vertebral artery is dominant. Skeleton: Degenerative changes of the cervical spine. Other neck: No mass or adenopathy. Upper chest: No apical lung mass. Review of the MIP images confirms the above findings CTA HEAD Anterior circulation: Intracranial internal carotid arteries are patent. Middle and anterior cerebral arteries are patent. Posterior circulation: Intracranial vertebral arteries, basilar artery, and posterior cerebral arteries are patent. A right posterior communicating artery is identified. Venous sinuses: As permitted by contrast timing, patent. Review of the MIP images confirms the above findings IMPRESSION: No acute intracranial abnormality. No large vessel occlusion, hemodynamically significant stenosis, or evidence of dissection. Electronically Signed   By: Macy Mis M.D.   On: 04/18/2019 17:36   CT ANGIO NECK W OR WO CONTRAST  Result Date: 04/18/2019 CLINICAL DATA:  Upper extremity weakness EXAM: CT ANGIOGRAPHY HEAD AND NECK TECHNIQUE: Multidetector CT  imaging of the head and neck was performed using the standard protocol during bolus administration of intravenous contrast. Multiplanar CT image reconstructions and MIPs were obtained to evaluate the vascular anatomy. Carotid stenosis measurements (when applicable) are obtained utilizing NASCET criteria, using the distal internal carotid diameter as the denominator. CONTRAST:  12mL OMNIPAQUE IOHEXOL 350 MG/ML SOLN COMPARISON:  Correlation made with MRI brain 04/16/2019 FINDINGS: CT HEAD Brain: There is no acute intracranial hemorrhage, mass effect, or edema. Gray-white differentiation is preserved. There is no extra-axial fluid collection. Ventricles and sulci are within normal limits in size and configuration. Vascular: No hyperdense vessel or unexpected calcification. Skull: Calvarium is unremarkable. Sinuses/Orbits: No acute finding. Other: None. Review of the MIP images confirms the above findings CTA NECK Aortic arch: Great vessel origins are patent. Right carotid system: Patent. No measurable stenosis or evidence of dissection. Left carotid system: Patent. No measurable stenosis or evidence of dissection. Vertebral arteries: Patent.  Right vertebral artery is dominant. Skeleton: Degenerative changes of the cervical spine. Other neck: No mass or adenopathy. Upper chest: No apical lung mass. Review of the MIP images confirms the above findings CTA HEAD Anterior circulation: Intracranial internal carotid arteries are patent.  Middle and anterior cerebral arteries are patent. Posterior circulation: Intracranial vertebral arteries, basilar artery, and posterior cerebral arteries are patent. A right posterior communicating artery is identified. Venous sinuses: As permitted by contrast timing, patent. Review of the MIP images confirms the above findings IMPRESSION: No acute intracranial abnormality. No large vessel occlusion, hemodynamically significant stenosis, or evidence of dissection. Electronically Signed   By:  Macy Mis M.D.   On: 04/18/2019 17:36    Scheduled Meds: . amLODipine  10 mg Oral Daily  . aspirin EC  81 mg Oral Daily  . enoxaparin (LOVENOX) injection  60 mg Subcutaneous Q24H  . feeding supplement (GLUCERNA SHAKE)  237 mL Oral TID BM  . fenofibrate  160 mg Oral Daily  . gabapentin  300 mg Oral TID  . insulin aspart  0-15 Units Subcutaneous TID WC  . linagliptin  5 mg Oral Daily  . lisinopril  40 mg Oral Daily  . metFORMIN  1,000 mg Oral BID WC  . metoprolol succinate  100 mg Oral Daily  . multivitamin with minerals  1 tablet Oral Daily  . rosuvastatin  5 mg Oral Daily    Continuous Infusions: . Immune Globulin 10%       LOS: 0 days     Annita Brod, MD [facility]   04/19/2019, 3:05 PM

## 2019-04-19 NOTE — Progress Notes (Signed)
Subjective: Still with BUE weakness, but subjectively improved since last visit.   Objective: Current vital signs: BP 134/79 (BP Location: Right Arm)   Pulse 71   Temp 98.3 F (36.8 C) (Oral)   Resp 16   Ht 5\' 9"  (1.753 m)   Wt (!) 148 kg   SpO2 97%   BMI 48.17 kg/m  Vital signs in last 24 hours: Temp:  [97.4 F (36.3 C)-98.3 F (36.8 C)] 98.3 F (36.8 C) (04/04 [id number]) Pulse Rate:  [64-71] 71 (04/04 0613) Resp:  [16-18] 16 (04/03 1837) BP: (107-134)/(61-79) 134/79 (04/04 0613) SpO2:  [96 %-98 %] 97 % (04/04 0613)  Intake/Output from previous day: No intake/output data recorded. Intake/Output this shift: No intake/output data recorded. Nutritional status:  Diet Order            Diet Carb Modified Fluid consistency: Thin; Room service appropriate? Yes  Diet effective now             HEENT: [place]/AT Lungs: Respirations unlabored Ext: No edema  Neurologic Exam: Ment: Alert and oriented. Speech fluent with intact comprehension. Good insight. Bright affect.  CN: Pupils equal. EOMI. No ptosis. Face symmetric without evidence for weakness. No hypophonia, nasal speech or hoarseness.  Motor: RUE: 4/5 finger abduction and extension. 4/5 grip. 4/5 wrist flexion and extension. 4+/5 biceps and 4-/5 triceps. 4+/5 deltoid. LUE:  4/5 finger abduction and extension. 4/5 grip. 4/5 wrist flexion and extension. 4+/5 biceps and 4-/5 triceps. 4+/5 deltoid. RLE 4/5 hip flexion and 5/5 knee extension.  LLE: 4/5 hip flexion and 5/5 knee extension. Deep Tendon Reflexes: 1+ brachioradialis and biceps bilaterally. 1+patellae bilaterally 2+ achilles bilaterally. Cerebellar:No ataxia noted.  Gait:Slightly wide based, waddling and antalgic gait in the context of arthritis. The patient states that his gait is near to his baseline.    Lab Results: Results for orders placed or performed during the hospital encounter of 04/16/19 (from the past 48 hour(s))  Glucose, capillary      Status: Abnormal   Collection Time: 04/17/19 11:01 AM  Result Value Ref Range   Glucose-Capillary 108 (H) 70 - 99 mg/dL    Comment: Glucose reference range applies only to samples taken after fasting for at least 8 hours.  Glucose, CSF     Status: None   Collection Time: 04/17/19  2:24 PM  Result Value Ref Range   Glucose, CSF 67 40 - 70 mg/dL    Comment: Performed at [facility] [address], [place], [place] [address]  Protein, CSF     Status: Abnormal   Collection Time: 04/17/19  2:24 PM  Result Value Ref Range   Total  Protein, CSF 66 (H) 15 - 45 mg/dL    Comment: Performed at [facility] [address], [place], [place] [address]  CSF cell count with differential     Status: Abnormal   Collection Time: 04/17/19  2:24 PM  Result Value Ref Range   Tube # 3    Color, CSF COLORLESS COLORLESS   Appearance, CSF CLEAR CLEAR   Supernatant NOT INDICATED    RBC Count, CSF 106 (H) 0 /cu mm   WBC, CSF 1 0 - 5 /cu mm   Other Cells, CSF TOO FEW TO COUNT, SMEAR AVAILABLE FOR REVIEW     Comment: RARE LYMPOHCYTES, RARE MONOCYTES Performed at [facility] [address], [place], [place] [address]   Glucose, capillary     Status: None   Collection Time:  04/17/19  4:38 PM  Result Value Ref Range   Glucose-Capillary 97 70 - 99 mg/dL    Comment: Glucose reference range applies only to samples taken after fasting for at least 8 hours.   Comment 1 Notify RN    Comment 2 Document in Chart   Glucose, capillary     Status: Abnormal   Collection Time: 04/17/19  9:42 PM  Result Value Ref Range   Glucose-Capillary 118 (H) 70 - 99 mg/dL    Comment: Glucose reference range applies only to samples taken after fasting for at least 8 hours.  Glucose, capillary     Status: Abnormal   Collection Time: 04/18/19  6:07 AM  Result Value Ref Range   Glucose-Capillary 132 (H) 70 - 99 mg/dL    Comment: Glucose reference range applies only to samples taken after fasting for at least 8  hours.  Glucose, capillary     Status: Abnormal   Collection Time: 04/18/19 11:00 AM  Result Value Ref Range   Glucose-Capillary 113 (H) 70 - 99 mg/dL    Comment: Glucose reference range applies only to samples taken after fasting for at least 8 hours.   Comment 1 Notify RN   Glucose, capillary     Status: Abnormal   Collection Time: 04/18/19  4:11 PM  Result Value Ref Range   Glucose-Capillary 119 (H) 70 - 99 mg/dL    Comment: Glucose reference range applies only to samples taken after fasting for at least 8 hours.   Comment 1 Notify RN   Glucose, capillary     Status: Abnormal   Collection Time: 04/19/19  5:54 AM  Result Value Ref Range   Glucose-Capillary 121 (H) 70 - 99 mg/dL    Comment: Glucose reference range applies only to samples taken after fasting for at least 8 hours.    Recent Results (from the past 240 hour(s))  SARS CORONAVIRUS 2 (TAT 6-24 HRS) Nasopharyngeal Nasopharyngeal Swab     Status: None   Collection Time: 04/16/19  9:17 PM   Specimen: Nasopharyngeal Swab  Result Value Ref Range Status   SARS Coronavirus 2 NEGATIVE NEGATIVE Final    Comment: (NOTE) SARS-CoV-2 target nucleic acids are NOT DETECTED. The SARS-CoV-2 RNA is generally detectable in upper and lower respiratory specimens during the acute phase of infection. Negative results do not preclude SARS-CoV-2 infection, do not rule out co-infections with other pathogens, and should not be used as the sole basis for treatment or other patient management decisions. Negative results must be combined with clinical observations, patient history, and epidemiological information. The expected result is Negative. Fact Sheet for Patients: SugarRoll.be Fact Sheet for Healthcare Providers: https://www.woods-mathews.com/ This test is not yet approved or cleared by the [place] FDA and  has been authorized for detection and/or diagnosis of SARS-CoV-2 by FDA under an  Emergency Use Authorization (EUA). This EUA will remain  in effect (meaning this test can be used) for the duration of the COVID-19 declaration under Section 56 4(b)(1) of the Act, 21 U.S.C. section 360bbb-3(b)(1), unless the authorization is terminated or revoked sooner. Performed at [facility], [place] [address], [place], [place] [address]     Lipid Panel No results for input(s): CHOL, TRIG, HDL, CHOLHDL, VLDL, LDLCALC in the last 72 hours.  Studies/Results: CT ANGIO HEAD W OR WO CONTRAST  Result Date: 04/18/2019 CLINICAL DATA:  Upper extremity weakness EXAM: CT ANGIOGRAPHY HEAD AND NECK TECHNIQUE: Multidetector CT imaging of the head and neck was performed  using the standard protocol during bolus administration of intravenous contrast. Multiplanar CT image reconstructions and MIPs were obtained to evaluate the vascular anatomy. Carotid stenosis measurements (when applicable) are obtained utilizing NASCET criteria, using the distal internal carotid diameter as the denominator. CONTRAST:  25mL OMNIPAQUE IOHEXOL 350 MG/ML SOLN COMPARISON:  Correlation made with MRI brain 04/16/2019 FINDINGS: CT HEAD Brain: There is no acute intracranial hemorrhage, mass effect, or edema. Gray-white differentiation is preserved. There is no extra-axial fluid collection. Ventricles and sulci are within normal limits in size and configuration. Vascular: No hyperdense vessel or unexpected calcification. Skull: Calvarium is unremarkable. Sinuses/Orbits: No acute finding. Other: None. Review of the MIP images confirms the above findings CTA NECK Aortic arch: Great vessel origins are patent. Right carotid system: Patent. No measurable stenosis or evidence of dissection. Left carotid system: Patent. No measurable stenosis or evidence of dissection. Vertebral arteries: Patent.  Right vertebral artery is dominant. Skeleton: Degenerative changes of the cervical spine. Other neck: No mass or adenopathy. Upper chest: No  apical lung mass. Review of the MIP images confirms the above findings CTA HEAD Anterior circulation: Intracranial internal carotid arteries are patent. Middle and anterior cerebral arteries are patent. Posterior circulation: Intracranial vertebral arteries, basilar artery, and posterior cerebral arteries are patent. A right posterior communicating artery is identified. Venous sinuses: As permitted by contrast timing, patent. Review of the MIP images confirms the above findings IMPRESSION: No acute intracranial abnormality. No large vessel occlusion, hemodynamically significant stenosis, or evidence of dissection. Electronically Signed   By: Macy Mis M.D.   On: 04/18/2019 17:36   CT ANGIO NECK W OR WO CONTRAST  Result Date: 04/18/2019 CLINICAL DATA:  Upper extremity weakness EXAM: CT ANGIOGRAPHY HEAD AND NECK TECHNIQUE: Multidetector CT imaging of the head and neck was performed using the standard protocol during bolus administration of intravenous contrast. Multiplanar CT image reconstructions and MIPs were obtained to evaluate the vascular anatomy. Carotid stenosis measurements (when applicable) are obtained utilizing NASCET criteria, using the distal internal carotid diameter as the denominator. CONTRAST:  26mL OMNIPAQUE IOHEXOL 350 MG/ML SOLN COMPARISON:  Correlation made with MRI brain 04/16/2019 FINDINGS: CT HEAD Brain: There is no acute intracranial hemorrhage, mass effect, or edema. Gray-white differentiation is preserved. There is no extra-axial fluid collection. Ventricles and sulci are within normal limits in size and configuration. Vascular: No hyperdense vessel or unexpected calcification. Skull: Calvarium is unremarkable. Sinuses/Orbits: No acute finding. Other: None. Review of the MIP images confirms the above findings CTA NECK Aortic arch: Great vessel origins are patent. Right carotid system: Patent. No measurable stenosis or evidence of dissection. Left carotid system: Patent. No  measurable stenosis or evidence of dissection. Vertebral arteries: Patent.  Right vertebral artery is dominant. Skeleton: Degenerative changes of the cervical spine. Other neck: No mass or adenopathy. Upper chest: No apical lung mass. Review of the MIP images confirms the above findings CTA HEAD Anterior circulation: Intracranial internal carotid arteries are patent. Middle and anterior cerebral arteries are patent. Posterior circulation: Intracranial vertebral arteries, basilar artery, and posterior cerebral arteries are patent. A right posterior communicating artery is identified. Venous sinuses: As permitted by contrast timing, patent. Review of the MIP images confirms the above findings IMPRESSION: No acute intracranial abnormality. No large vessel occlusion, hemodynamically significant stenosis, or evidence of dissection. Electronically Signed   By: Macy Mis M.D.   On: 04/18/2019 17:36   DG FL GUIDED LUMBAR PUNCTURE  Result Date: 04/17/2019 CLINICAL DATA:  Arm weakness EXAM: DIAGNOSTIC LUMBAR PUNCTURE  UNDER FLUOROSCOPIC GUIDANCE FLUOROSCOPY TIME:  48 seconds; 199  uGym2 DAP PROCEDURE: Informed consent was obtained from the patient prior to the procedure, including potential complications of headache, allergy, and pain. Operators: Woodward Ku With the patient prone, the lower back was prepped with Betadine and chlorhexidine. 1% Lidocaine was used for local anesthesia. Lumbar puncture was performed at the L3 level from a left parasagittal approach using a 5 inch 20 gauge needle with return of clear colorless CSF with an opening pressure of 13 cm water. 7 ml of CSF were obtained for laboratory studies. The patient tolerated the procedure well and there were no apparent complications. IMPRESSION: Technically successful lumbar puncture under fluoroscopy. Electronically Signed   By: Lucrezia Europe M.D.   On: 04/17/2019 14:40    Medications:  Scheduled: . amLODipine  10 mg Oral Daily  . aspirin EC  81 mg  Oral Daily  . enoxaparin (LOVENOX) injection  60 mg Subcutaneous Q24H  . feeding supplement (GLUCERNA SHAKE)  237 mL Oral TID BM  . fenofibrate  160 mg Oral Daily  . gabapentin  300 mg Oral TID  . insulin aspart  0-15 Units Subcutaneous TID WC  . linagliptin  5 mg Oral Daily  . lisinopril  40 mg Oral Daily  . metFORMIN  1,000 mg Oral BID WC  . metoprolol succinate  100 mg Oral Daily  . multivitamin with minerals  1 tablet Oral Daily  . rosuvastatin  5 mg Oral Daily   Continuous: . Immune Globulin 10%      Assessment:64 year old male with new onset of BUE and BLE weakness.Exam has improved today (Sunday) relative to yesterday without any treatment, but appears to be plateauing.  1.Motor strength improved today (Sunday) relative to yesterday in [place], and now back to baseline in BLE. Reflexes are, however, decreased in upper extremities relative to yesterday.  2. MRI c-spine with equivocal lesions at C5-6 per official Radiology report. On further review by the cervical spine MRI by the Neurologist, the hyperintensities in the cord appear more consistent with artifact. Overall clinical picture is not consistent with a compressive myelopathy.  3. MRI brain has ruled out stroke.  4. CK level was normal.  5. 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 Of note, in the early stages of GBS, reflexes may be preserved. 6. CTA of head and neck: No large vessel occlusion, hemodynamically significant stenosis, or evidence of dissection.  Recommendations: 1.Discussed risks/benefits of starting IVIG given that his strength seems to be plateauing at a level significantly lower than that which would be necessary for him to perform his job. IVIG is indicated. After further discussion with his family, the patient has provided informed consent for IVIG treatment.  2. Inpatient PT and OT.    LOS: 0 days   @Electronically  signed: Dr. Kerney Elbe 04/19/2019  10:28 AM

## 2019-04-20 DIAGNOSIS — R531 Weakness: Secondary | ICD-10-CM

## 2019-04-20 DIAGNOSIS — I2510 Atherosclerotic heart disease of native coronary artery without angina pectoris: Secondary | ICD-10-CM

## 2019-04-20 DIAGNOSIS — Z794 Long term (current) use of insulin: Secondary | ICD-10-CM

## 2019-04-20 DIAGNOSIS — E1165 Type 2 diabetes mellitus with hyperglycemia: Secondary | ICD-10-CM

## 2019-04-20 DIAGNOSIS — I5032 Chronic diastolic (congestive) heart failure: Secondary | ICD-10-CM

## 2019-04-20 LAB — GLUCOSE, CAPILLARY
Glucose-Capillary: 139 mg/dL — ABNORMAL HIGH (ref 70–99)
Glucose-Capillary: 135 mg/dL — ABNORMAL HIGH (ref 70–99)
Glucose-Capillary: 124 mg/dL — ABNORMAL HIGH (ref 70–99)
Glucose-Capillary: 135 mg/dL — ABNORMAL HIGH (ref 70–99)

## 2019-04-20 LAB — IGG CSF INDEX
Albumin CSF-mCnc: 42 mg/dL (ref 11–48)
Albumin: 3.6 g/dL — ABNORMAL LOW (ref 3.8–4.8)
CSF IgG Index: 0.6 (ref 0.0–0.7)
IgG (Immunoglobin G), Serum: 561 mg/dL — ABNORMAL LOW (ref 603–1613)
IgG, CSF: 3.8 mg/dL (ref 0.0–8.6)
IgG/Alb Ratio, CSF: 0.09 (ref 0.00–0.25)

## 2019-04-20 MED ORDER — FUROSEMIDE 20 MG PO TABS
20.0000 mg | ORAL_TABLET | Freq: Every day | ORAL | Status: DC
  Administered 2019-04-21 – 2019-04-23 (×3): 20 mg via ORAL
  Filled 2019-04-20 (×3): qty 1

## 2019-04-20 NOTE — Progress Notes (Signed)
Travis [name], Travis [name]   PCP: [name], MD  Patient is from: Home.  DOA: 04/16/2019 LOS: 1  Brief Narrative / Interim history: 64 year old male, employee of the health system, who was in his usual state of health on morning of 4/1 when at work, he suddenly noticed that both his hands had contracted and he could not straighten them out.  He initially denied have any type of grip strength or proximal arm weakness or any lower extremity weakness, but went to the emergency room to be evaluated.  Symptoms resolved and patient was discharged from the ER, but then returned back that same day when symptoms recurred.  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  Patient initially assessed by neurosurgery, but felt that there was no need for neurosurgical intervention as areas did not seem to coordinate with patient's symptoms.  Neurology consulted neurology felt that cord lesions appear to be more artifact and.  In further discussion with the patient, it was found that he had a short spell of bilateral foot paresthesias that had spontaneously resolved.  No sensory symptoms.  He stated that he had a brief illness approximately 2 weeks prior.  No recent vaccination.  With these findings, concern was for possible Guillain-Barr syndrome.  Patient was brought in and admitted to the hospitalist service.  He underwent a lumbar puncture by fluoroscopy whereby proteins were noted on his LP.  On 4/3, it was noted that patient symptoms appear to have improved from when he initially presented and neurology held off on IV immunoglobulin in hopes that his course would correct on its own. However, by 4/4, patient's had not changed from the previous day, and neurology started IVIG.  Patient symptoms improved after IVIG.  Subjective: Seen and examined earlier this morning.  No major  events overnight or this morning.  No complaints.  Reports significant improvement in his arm weakness after IVIG.  Denies chest pain, dyspnea, GI or UTI symptoms.  Objective: Vitals:   04/20/19 1100 04/20/19 1115 04/20/19 1132 04/20/19 1152  BP: (!) 144/88 128/75 127/78 121/68  Pulse: 80 88 90 84  Resp: 18 18 18 16   Temp: 98.2 F (36.8 C) 98.4 F (36.9 C) 98.2 F (36.8 C) 98.3 F (36.8 C)  TempSrc: Oral Oral Oral Oral  SpO2: 99% 98% 99% 99%  Weight:      Height:        Intake/Output Summary (Last 24 hours) at 04/20/2019 1201 Last data filed at 04/19/2019 1515 Gross per 24 hour  Intake 600 ml  Output --  Net 600 ml   Filed Weights   04/[name]/21 0403  Weight: (!) 148 kg    Examination:  GENERAL: No apparent distress. Nontoxic.  HEENT: MMM.  Vision and hearing grossly intact.  NECK: Supple.  No apparent JVD.  RESP:  No IWOB. Good air movement bilaterally. CVS:  RRR. Heart sounds normal.  ABD/GI/GU: BS present. Soft. Non tender.  MSK/EXT:  Moves extremities. No apparent deformity. No edema.  SKIN: no apparent skin lesion or wound NEURO: Awake, alert and oriented appropriately.  CN II-XII grossly intact.  Motor 4+/5 BUE and 5/5 BLE. PSYCH: Calm. Normal affect.  Procedures:  None  Microbiology summarized: 4/1-COVID-19 PCR negative.  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  Work-up including MRI C-spine, MRI brain, CTA head and neck, and CK  unrevealing.  Patient is on fenofibrate and Crestor which could potentially contribute.  Improving with IVIG. -Neurology following -IVIG 4/4>>> -Hold fenofibrate  Controlled DM-2 with hyperglycemia and neuropathy: A1c 6.3% on 04/01/2019. Recent Labs    04/19/19 2218 04/20/19 0605 04/20/19 1111  GLUCAP 119* 139* 135*  -Continue SSI, Tradjenta, Metformin, Crestor and gabapentin  Chronic diastolic CHF: Echo in [id number] with EF of 60 to [id number], diastolic dysfunction and moderate  pulmonary hypertension.  On Lasix 20 to 40 mg daily at home. -Resume home Lasix -Monitor fluid status  Chronic CAD status post PCI in 1997: No anginal symptoms. -Continue home medications.  Essential hypertension: Normotensive. -Continue home amlodipine, metoprolol XL and lisinopril  Dyslipidemia: Lipid panel in 11/2018 with LDL 69 and TG 92. -Hold fenofibrate. -Continue statin  Obstructive sleep apnea:  -On nightly CPAP  Morbid obesity: Body mass index is 48.17 kg/m. Nutrition Problem: Increased nutrient needs Etiology: acute illness(cervical spondylosis)  Signs/Symptoms: estimated needs  Interventions: Glucerna shake, MVI   DVT prophylaxis: Subcu Lovenox Code Status: Full code Family Communication: Patient and/or RN. Available if any question.  Discharge barrier: Patient is on IVIG Patient is from: Home Final disposition: Likely home in the next 4 to 5 days after IVIG.  Consultants:  Neurology   Sch Meds:  Scheduled Meds: . amLODipine  10 mg Oral Daily  . aspirin EC  81 mg Oral Daily  . enoxaparin (LOVENOX) injection  60 mg Subcutaneous Q24H  . feeding supplement (GLUCERNA SHAKE)  237 mL Oral TID BM  . [START ON 04/21/2019] furosemide  20 mg Oral Daily  . gabapentin  300 mg Oral TID  . insulin aspart  0-15 Units Subcutaneous TID WC  . linagliptin  5 mg Oral Daily  . lisinopril  40 mg Oral Daily  . metFORMIN  1,000 mg Oral BID WC  . metoprolol succinate  100 mg Oral Daily  . multivitamin with minerals  1 tablet Oral Daily  . rosuvastatin  5 mg Oral Daily   Continuous Infusions: . Immune Globulin 10% 355.2 mL/hr at 04/20/19 1155   PRN Meds:.acetaminophen, albuterol, traZODone  Antimicrobials: Anti-infectives (From admission, onward)   None       I have personally reviewed the following labs and images: CBC: Recent Labs  Lab 04/16/19 0556 04/[name]/21 0047  WBC 6.2 8.8  HGB 12.2* 12.6*  HCT 37.6* 38.8*  MCV 93.3 92.6  PLT 202 204   BMP  &GFR Recent Labs  Lab 04/16/19 0556 04/[name]/21 0047  NA 145  --   K 4.0  --   CL 105  --   CO2 26  --   GLUCOSE 138*  --   BUN 26*  --   CREATININE 1.07 0.95  CALCIUM 9.5  --    Estimated Creatinine Clearance: 115.9 mL/min (by C-G formula based on SCr of 0.95 mg/dL). Liver & Pancreas: Recent Labs  Lab 04/16/19 0556  AST 27  ALT 31  ALKPHOS 56  BILITOT 0.5  PROT 6.1*  ALBUMIN 3.6   No results for input(s): LIPASE, AMYLASE in the last 168 hours. No results for input(s): AMMONIA in the last 168 hours. Diabetic: No results for input(s): HGBA1C in the last 72 hours. Recent Labs  Lab 04/19/19 1103 04/19/19 1618 04/19/19 2218 04/20/19 0605 04/20/19 1111  GLUCAP 101* 94 119* 139* 135*   Cardiac Enzymes: Recent Labs  Lab 04/[name]/21 0011  CKTOTAL 93   No results for input(s): PROBNP in the last 8760 hours. Coagulation Profile: No results  for input(s): INR, PROTIME in the last 168 hours. Thyroid Function Tests: No results for input(s): TSH, T4TOTAL, FREET4, T3FREE, THYROIDAB in the last 72 hours. Lipid Profile: No results for input(s): CHOL, HDL, LDLCALC, TRIG, CHOLHDL, LDLDIRECT in the last 72 hours. Anemia Panel: No results for input(s): VITAMINB12, FOLATE, FERRITIN, TIBC, IRON, RETICCTPCT in the last 72 hours. Urine analysis:    Component Value Date/Time   LABSPEC 1.015 01/06/2012 1702   PHURINE 6.5 01/06/2012 1702   GLUCOSEU NEGATIVE 01/06/2012 1702   HGBUR LARGE (A) 01/06/2012 1702   BILIRUBINUR negative [name]/04/2015 1110   KETONESUR negative [name]/04/2015 1110   KETONESUR NEGATIVE 01/06/2012 1702   PROTEINUR negative [name]/04/2015 1110   PROTEINUR 100 (A) 01/06/2012 1702   UROBILINOGEN 1.0 [name]/04/2015 1110   UROBILINOGEN 1.0 01/06/2012 1702   NITRITE Negative [name]/04/2015 1110   NITRITE NEGATIVE 01/06/2012 1702   LEUKOCYTESUR Negative [name]/04/2015 1110   Sepsis Labs: Invalid input(s): PROCALCITONIN, [place]  Microbiology: Recent Results (from the past 240  hour(s))  SARS CORONAVIRUS 2 (TAT 6-24 HRS) Nasopharyngeal Nasopharyngeal Swab     Status: None   Collection Time: 04/16/19  9:17 PM   Specimen: Nasopharyngeal Swab  Result Value Ref Range Status   SARS Coronavirus 2 NEGATIVE NEGATIVE Final    Comment: (NOTE) SARS-CoV-2 target nucleic acids are NOT DETECTED. The SARS-CoV-2 RNA is generally detectable in upper and lower respiratory specimens during the acute phase of infection. Negative results do not preclude SARS-CoV-2 infection, do not rule out co-infections with other pathogens, and should not be used as the sole basis for treatment or other patient management decisions. Negative results must be combined with clinical observations, patient history, and epidemiological information. The expected result is Negative. Fact Sheet for Patients: SugarRoll.be Fact Sheet for Healthcare Providers: https://www.woods-mathews.com/ This test is not yet approved or cleared by the [place] FDA and  has been authorized for detection and/or diagnosis of SARS-CoV-2 by FDA under an Emergency Use Authorization (EUA). This EUA will remain  in effect (meaning this test can be used) for the duration of the COVID-19 declaration under Section 56 4(b)(1) of the Act, 21 U.S.C. section 360bbb-3(b)(1), unless the authorization is terminated or revoked sooner. Performed at [facility], [place] [address], [place], [place] [address]     Radiology Studies: No results found.    Shelsy Seng T. [place]  If 7PM-7AM, please contact night-coverage [email or web address] Password [email or web address] 04/20/2019, 12:01 PM

## 2019-04-20 NOTE — Progress Notes (Signed)
NEUROLOGY PROGRESS NOTE   Subjective: Patient is feeling stronger with bilateral hands.  Exam: Vitals:   04/20/19 0042 04/20/19 0600  BP: (!) 142/59 132/77  Pulse: 66 75  Resp:    Temp: 97.9 F (36.6 C) 98.2 F (36.8 C)  SpO2: 94% 98%    Physical Exam  Constitutional: Appears well-developed and well-nourished.  Eyes: No scleral injection HENT: No OP obstrucion Head: Normocephalic.  Cardiovascular: Normal rate and regular rhythm.  Respiratory: Effort normal, non-labored breathing GI: Soft.  No distension. There is no tenderness.  Skin: WDI   Neuro:  Mental Status: Alert, oriented, thought content appropriate.  Speech fluent without evidence of aphasia.  Able to follow 3 step commands without difficulty. Cranial Nerves: II:  Visual fields grossly normal,  III,IV, VI: ptosis not present, extra-ocular motions intact bilaterally pupils equal, round, reactive to light and accommodation V,VII: smile symmetric, facial light touch sensation normal bilaterally VIII: hearing normal bilaterally IX,X: Palate rises midline XI: bilateral shoulder shrug XII: midline tongue extension Motor: Bilateral lower extremities 5/5, bilateral tricep extension 4/5, bilateral bicep flexion 5/5, bilateral wrist flexion, extension, finger flexion and extension 5/5.  Bilateral finger abduction and abduction 4/5. Sensory: Pinprick and light touch intact throughout, bilaterally Deep Tendon Reflexes: 2+ and symmetric throughout tension of Achilles Plantars: Right: downgoing   Left: downgoing Cerebellar: normal finger-to-nose,     Medications:  Scheduled: . amLODipine  10 mg Oral Daily  . aspirin EC  81 mg Oral Daily  . enoxaparin (LOVENOX) injection  60 mg Subcutaneous Q24H  . feeding supplement (GLUCERNA SHAKE)  237 mL Oral TID BM  . fenofibrate  160 mg Oral Daily  . gabapentin  300 mg Oral TID  . insulin aspart  0-15 Units Subcutaneous TID WC  . linagliptin  5 mg Oral Daily  . lisinopril   40 mg Oral Daily  . metFORMIN  1,000 mg Oral BID WC  . metoprolol succinate  100 mg Oral Daily  . multivitamin with minerals  1 tablet Oral Daily  . rosuvastatin  5 mg Oral Daily    Pertinent Labs/Diagnostics: Results for DAUGHTRY, DOBEK (MRN [id number]) as of 04/20/2019 10:13  Ref. Range 04/17/2019 14:24  Appearance, CSF Latest Ref Range: CLEAR  CLEAR  Glucose, CSF Latest Ref Range: 40 - 70 mg/dL 67  RBC Count, CSF Latest Ref Range: 0 /cu mm 106 (H)  WBC, CSF Latest Ref Range: 0 - 5 /cu mm 1  Other Cells, CSF Unknown TOO FEW TO COUNT, SMEAR AVAILABLE FOR REVIEW  Color, CSF Latest Ref Range: COLORLESS  COLORLESS  Supernatant Unknown NOT INDICATED  Total  Protein, CSF Latest Ref Range: 15 - 45 mg/dL 66 (H)  Tube # Unknown 3     CT ANGIO NECK W OR WO CONTRAST  Result Date: 04/18/2019  IMPRESSION: No acute intracranial abnormality. No large vessel occlusion, hemodynamically significant stenosis, or evidence of dissection. Electronically Signed   By: Macy Mis M.D.   On: 04/18/2019 17:36   MRI cervical spine: IMPRESSION: 1. Soft disc protrusion at T1-2 to the right which could affect the right T1 nerve. 2. Right lateral recess and right foraminal impingement at C4-5 which could affect the right C5 nerve. 3. Left lateral recess and foraminal stenosis at C5-6 which could affect the left C6 nerve. 4. Bilateral foraminal stenosis at C6-7 which could affect either or both C7 nerves. 5. 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    Assessment:  64 year old male with sudden onset of bilateral wrist flexion/extension and finger flexion/extension and abduction abduction.  Patient had 2 episodes which only lasted for short period of time.  At this point time patient now feels as though his strength is back.  Patient did have a LP which did show elevated protein however he states he had no prodrome or slow onset of weakness.  MRI C-spine does show C5-6  foraminal narrowing with hyperintensities in the cord which appear more consistent with artifact.  CTA obtained did not show any large vessel occlusions.  Given patient's elevated protein on CSF and started with IVIG.  Currently exam shows 5/5 with wrist flexion/extension, finger flexion/extension with mild weakness in finger abduction and abduction.  Differential could include the possibility mild case of Guillain-Barr however it would be abnormal for her to be sudden onset.  As noted prior overall clinical picture is not consistent with compressive myelopathy.   Recommendations: -At this point continue Empiric IVIG treatment for presumptive  Guillain-Barr  -PT OT-patient will need a nerve conduction EMG as an outpatient  Etta Quill PA-C [facility] [phone]  04/20/2019, 10:10 AM

## 2019-04-20 NOTE — Progress Notes (Signed)
Pt stated he could place self on cpap unit when ready for bed. RT added water to water chamber per pt request. RT will continue to monitor as needed.

## 2019-04-21 LAB — GLUCOSE, CAPILLARY
Glucose-Capillary: 139 mg/dL — ABNORMAL HIGH (ref 70–99)
Glucose-Capillary: 115 mg/dL — ABNORMAL HIGH (ref 70–99)
Glucose-Capillary: 111 mg/dL — ABNORMAL HIGH (ref 70–99)
Glucose-Capillary: 132 mg/dL — ABNORMAL HIGH (ref 70–99)

## 2019-04-21 LAB — VITAMIN D 25 HYDROXY (VIT D DEFICIENCY, FRACTURES): Vit D, 25-Hydroxy: 34.08 ng/mL (ref 30–100)

## 2019-04-21 MED ORDER — HEPARIN SODIUM (PORCINE) 5000 UNIT/ML IJ SOLN
INTRAMUSCULAR | Status: AC
  Filled 2019-04-21: qty 1

## 2019-04-21 NOTE — Progress Notes (Signed)
Patient refused to wear CPAP for the night. RT will continue to monitor as neeeded.

## 2019-04-21 NOTE — Progress Notes (Signed)
PROGRESS NOTE  CORELL ORLOFF [id number] DOB: 1955/07/04   PCP: [name], MD  Patient is from: Home.  DOA: 04/16/2019 LOS: 2  Brief Narrative / Interim history: 64 year old male, employee of the health system, who was in his usual state of health on morning of 4/1 when at work, he suddenly noticed that both his hands had contracted and he could not straighten them out.  He initially denied have any type of grip strength or proximal arm weakness or any lower extremity weakness, but went to the emergency room to be evaluated.  Symptoms resolved and patient was discharged from the ER, but then returned back that same day when symptoms recurred.  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  Patient initially assessed by neurosurgery, but felt that there was no need for neurosurgical intervention as areas did not seem to coordinate with patient's symptoms.  Neurology consulted neurology felt that cord lesions appear to be more artifact and.  In further discussion with the patient, it was found that he had a short spell of bilateral foot paresthesias that had spontaneously resolved.  No sensory symptoms.  He stated that he had a brief illness approximately 2 weeks prior.  No recent vaccination.  With these findings, concern was for possible Guillain-Barr syndrome.  Patient was brought in and admitted to the hospitalist service.  He underwent a lumbar puncture by fluoroscopy whereby proteins were noted on his LP.  On 4/3, it was noted that patient symptoms appear to have improved from when he initially presented and neurology held off on IV immunoglobulin in hopes that his course would correct on its own. However, by 4/4, patient's had not changed from the previous day, and neurology started IVIG.  Patient symptoms improved after IVIG.  Subjective: Seen and examined earlier this morning.  No major  events overnight or this morning.  No complaints.  Reports improvement in his weakness.  Denies numbness, tingling, chest pain, dyspnea, GI or UTI symptoms.  Objective: Vitals:   04/20/19 1350 04/20/19 1843 04/20/19 2352 04/21/19 0509  BP: (!) 108/96 128/67 120/75 124/76  Pulse: 60 77 76 78  Resp: 18 18 18 18   Temp: 98 F (36.7 C) 97.7 F (36.5 C) (!) 97.5 F (36.4 C) 97.8 F (36.6 C)  TempSrc: Oral Oral Oral Oral  SpO2: 98% 97% 99% 96%  Weight: (!) 148.2 kg     Height:       No intake or output data in the 24 hours ending 04/21/19 1117 Filed Weights   04/17/19 0403 04/20/19 1350  Weight: (!) 148 kg (!) 148.2 kg    Examination:  GENERAL: No acute distress.  Appears well.  HEENT: MMM.  Vision and hearing grossly intact.  NECK: Supple.  No apparent JVD.  RESP: On room air.  No IWOB. Good air movement bilaterally. CVS:  RRR. Heart sounds normal.  ABD/GI/GU: Bowel sounds present. Soft. Non tender.  MSK/EXT:  Moves extremities. No apparent deformity. No edema.  SKIN: no apparent skin lesion or wound NEURO: Awake, alert and oriented appropriately.  CN II-XII grossly intact.  Grip strength is 4+/5 bilaterally.  5/5 elsewhere. PSYCH: Calm. Normal affect.  Procedures:  None  Microbiology summarized: 4/1-COVID-19 PCR negative.  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  Work-up including MRI C-spine, MRI brain, CTA head and neck, and CK unrevealing.  Patient is on fenofibrate  and Crestor which could potentially contribute.  Improving with IVIG. -Neurology following -IVIG 4/4>>> -Discontinue fenofibrate on discharge.  Controlled DM-2 with hyperglycemia and neuropathy: A1c 6.3% on 04/01/2019. Recent Labs    04/20/19 2116 04/21/19 0611 04/21/19 1101  GLUCAP 135* 139* 115*  -Continue SSI, Tradjenta, Metformin, Crestor and gabapentin  Chronic diastolic CHF: Echo in [id number] with EF of 60 to [id number], diastolic dysfunction and  moderate pulmonary hypertension.  On Lasix 20 to 40 mg daily at home. -Resume home Lasix at 20 mg daily -Monitor fluid status  Chronic CAD status post PCI in 1997: No anginal symptoms. -Continue home medications.  Essential hypertension: Normotensive. -Continue home amlodipine, metoprolol XL and lisinopril  Dyslipidemia: Lipid panel in 11/2018 with LDL 69 and TG 92. -Discontinue fenofibrate at discharge. -Continue statin  Obstructive sleep apnea:  -On nightly CPAP  Morbid obesity: Body mass index is 48.25 kg/m. Nutrition Problem: Increased nutrient needs Etiology: acute illness(cervical spondylosis)  Signs/Symptoms: estimated needs  Interventions: Glucerna shake, MVI   DVT prophylaxis: Subcu Lovenox Code Status: Full code Family Communication: Patient and/or RN. Available if any question.  Discharge barrier: Patient is on IVIG Patient is from: Home Final disposition: Likely home in the next 3 to 4 days after IVIG.  Consultants:  Neurology   Sch Meds:  Scheduled Meds: . amLODipine  10 mg Oral Daily  . aspirin EC  81 mg Oral Daily  . enoxaparin (LOVENOX) injection  60 mg Subcutaneous Q24H  . feeding supplement (GLUCERNA SHAKE)  237 mL Oral TID BM  . furosemide  20 mg Oral Daily  . gabapentin  300 mg Oral TID  . insulin aspart  0-15 Units Subcutaneous TID WC  . linagliptin  5 mg Oral Daily  . lisinopril  40 mg Oral Daily  . metFORMIN  1,000 mg Oral BID WC  . metoprolol succinate  100 mg Oral Daily  . multivitamin with minerals  1 tablet Oral Daily  . rosuvastatin  5 mg Oral Daily   Continuous Infusions: . Immune Globulin 10% Stopped (04/20/19 1326)   PRN Meds:.acetaminophen, albuterol, traZODone  Antimicrobials: Anti-infectives (From admission, onward)   None       I have personally reviewed the following labs and images: CBC: Recent Labs  Lab 04/16/19 0556 04/17/19 0047  WBC 6.2 8.8  HGB 12.2* 12.6*  HCT 37.6* 38.8*  MCV 93.3 92.6  PLT 202  204   BMP &GFR Recent Labs  Lab 04/16/19 0556 04/17/19 0047  NA 145  --   K 4.0  --   CL 105  --   CO2 26  --   GLUCOSE 138*  --   BUN 26*  --   CREATININE 1.07 0.95  CALCIUM 9.5  --    Estimated Creatinine Clearance: 116 mL/min (by C-G formula based on SCr of 0.95 mg/dL). Liver & Pancreas: Recent Labs  Lab 04/16/19 0556 04/17/19 1424  AST 27  --   ALT 31  --   ALKPHOS 56  --   BILITOT 0.5  --   PROT 6.1*  --   ALBUMIN 3.6 3.6*   No results for input(s): LIPASE, AMYLASE in the last 168 hours. No results for input(s): AMMONIA in the last 168 hours. Diabetic: No results for input(s): HGBA1C in the last 72 hours. Recent Labs  Lab 04/20/19 1111 04/20/19 1634 04/20/19 2116 04/21/19 0611 04/21/19 1101  GLUCAP 135* 124* 135* 139* 115*   Cardiac Enzymes: Recent Labs  Lab 04/17/19 0011  CKTOTAL 93  No results for input(s): PROBNP in the last 8760 hours. Coagulation Profile: No results for input(s): INR, PROTIME in the last 168 hours. Thyroid Function Tests: No results for input(s): TSH, T4TOTAL, FREET4, T3FREE, THYROIDAB in the last 72 hours. Lipid Profile: No results for input(s): CHOL, HDL, LDLCALC, TRIG, CHOLHDL, LDLDIRECT in the last 72 hours. Anemia Panel: No results for input(s): VITAMINB12, FOLATE, FERRITIN, TIBC, IRON, RETICCTPCT in the last 72 hours. Urine analysis:    Component Value Date/Time   LABSPEC 1.015 01/06/2012 1702   PHURINE 6.5 01/06/2012 1702   GLUCOSEU NEGATIVE 01/06/2012 1702   HGBUR LARGE (A) 01/06/2012 1702   BILIRUBINUR negative 02/19/2015 1110   KETONESUR negative 02/19/2015 1110   KETONESUR NEGATIVE 01/06/2012 1702   PROTEINUR negative 02/19/2015 1110   PROTEINUR 100 (A) 01/06/2012 1702   UROBILINOGEN 1.0 02/19/2015 1110   UROBILINOGEN 1.0 01/06/2012 1702   NITRITE Negative 02/19/2015 1110   NITRITE NEGATIVE 01/06/2012 1702   LEUKOCYTESUR Negative 02/19/2015 1110   Sepsis Labs: Invalid input(s): PROCALCITONIN,  [place]  Microbiology: Recent Results (from the past 240 hour(s))  SARS CORONAVIRUS 2 (TAT 6-24 HRS) Nasopharyngeal Nasopharyngeal Swab     Status: None   Collection Time: 04/16/19  9:17 PM   Specimen: Nasopharyngeal Swab  Result Value Ref Range Status   SARS Coronavirus 2 NEGATIVE NEGATIVE Final    Comment: (NOTE) SARS-CoV-2 target nucleic acids are NOT DETECTED. The SARS-CoV-2 RNA is generally detectable in upper and lower respiratory specimens during the acute phase of infection. Negative results do not preclude SARS-CoV-2 infection, do not rule out co-infections with other pathogens, and should not be used as the sole basis for treatment or other patient management decisions. Negative results must be combined with clinical observations, patient history, and epidemiological information. The expected result is Negative. Fact Sheet for Patients: SugarRoll.be Fact Sheet for Healthcare Providers: https://www.woods-mathews.com/ This test is not yet approved or cleared by the [place] FDA and  has been authorized for detection and/or diagnosis of SARS-CoV-2 by FDA under an Emergency Use Authorization (EUA). This EUA will remain  in effect (meaning this test can be used) for the duration of the COVID-19 declaration under Section 56 4(b)(1) of the Act, 21 U.S.C. section 360bbb-3(b)(1), unless the authorization is terminated or revoked sooner. Performed at [facility], [place] [address], [place],  [address]     Radiology Studies: No results found.    Easten Maceachern T. Erick  If 7PM-7AM, please contact night-coverage [email or web address] Password [facility] 04/21/2019, 11:17 AM

## 2019-04-22 DIAGNOSIS — E119 Type 2 diabetes mellitus without complications: Secondary | ICD-10-CM

## 2019-04-22 LAB — GLUCOSE, CAPILLARY
Glucose-Capillary: 120 mg/dL — ABNORMAL HIGH (ref 70–99)
Glucose-Capillary: 118 mg/dL — ABNORMAL HIGH (ref 70–99)
Glucose-Capillary: 104 mg/dL — ABNORMAL HIGH (ref 70–99)
Glucose-Capillary: 105 mg/dL — ABNORMAL HIGH (ref 70–99)

## 2019-04-22 NOTE — Progress Notes (Signed)
Reason for consult: Bilateral upper extremity weakness  Subjective: Patient states that his symptoms have improved and the strength is increased since his admission.  He will receive fourth dose of IVIG.   ROS: negative except above  Examination  Vital signs in last 24 hours: Temp:  [97.7 F (36.5 C)-98.8 F (37.1 C)] 97.7 F (36.5 C) (04/07 0511) Pulse Rate:  [74-89] 85 (04/07 0511) Resp:  [18] 18 (04/07 0511) BP: (110-138)/(33-86) 130/79 (04/07 0511) SpO2:  [95 %-99 %] 97 % (04/07 0511)  General: lying in be CVS: pulse-normal rate and rhythm RS: breathing comfortably Extremities: normal   Neuro: MS: Alert, oriented, follows commands CN: pupils equal and reactive,  EOMI, face symmetric, tongue midline, normal sensation over face, Motor: 5/5 strength in all 4 extremities Reflexes: 2+ bilaterally over patella, biceps, plantars: flexor Coordination: normal Gait: not tested  Basic Metabolic Panel: Recent Labs  Lab 04/16/19 0556 04/17/19 0047  NA 145  --   K 4.0  --   CL 105  --   CO2 26  --   GLUCOSE 138*  --   BUN 26*  --   CREATININE 1.07 0.95  CALCIUM 9.5  --     CBC: Recent Labs  Lab 04/16/19 0556 04/17/19 0047  WBC 6.2 8.8  HGB 12.2* 12.6*  HCT 37.6* 38.8*  MCV 93.3 92.6  PLT 202 204     Coagulation Studies: No results for input(s): LABPROT, INR in the last 72 hours.  Imaging Reviewed:     ASSESSMENT AND PLAN  64 year old male who presents with sudden onset bilateral upper extremity weakness most predominantly in both hands.  Work-up included MRI C-spine which showed bilateral C6-C7 foraminal stenosis, subtle myelopathy versus artifact.  Patient underwent lumbar puncture which showed elevated protein of 66. IgG index normal.  Normal MRI of his brain. Patient was empirically started on IVIG by consulting neurologist Dr. Cheral Marker after discussion with family on 04/19/2019.   Based on history, unclear if this is AIDP but understand reasoning behind  empiric IVIG.  His reflexes are 2+ bilaterally, not hyperreflexic so myelopathy less likely.  Radiculopathy a possibility, in EMG/nerve conduction study would be helpful.  Patient also has been having cramping of his hands so I do think it is reasonable to have carpal tunnel syndrome in the differential even though it is unusual for it to cause sudden onset weakness.  Acute bilateral distal upper extremity weakness Differentials at this point include AIDP, peripheral neuropathy/carpal tunnel syndrome, radiculopathy.  Recommendations -Complete course of empiric IVIG x 5 days -Continue PT OT -Outpatient neurology referral for EMG/nerve conduction studies  Neurology will be available as needed.   Karena Addison Braylen Staller [facility] Pager Number [phone] For questions after 7pm please refer to AMION to reach the Neurologist on call

## 2019-04-22 NOTE — TOC Initial Note (Signed)
Transition of Care [facility]) - Initial/Assessment Note    Patient Details  Name: Travis [name] MRN: [id number] Date of Birth: 1955-11-25  [facility] CM/SW Contact:    Marilu Favre, RN Phone Number: 04/22/2019, 3:07 PM  Clinical Narrative:                 Confirmed face sheet information with patient at bedside. Patient from home with wife. Discussed OP PT, OT patient agreeable and has taken wife to Neuro rehab [address] before. Entered orders asked MD to sign   Expected Discharge Plan: Home/Self Care Barriers to Discharge: Continued Medical Work up   Patient Goals and CMS Choice Patient states their goals for this hospitalization and ongoing recovery are:: to return to home CMS Medicare.gov Compare Post Acute Care list provided to:: Patient Choice offered to / list presented to : Patient, Spouse  Expected Discharge Plan and Services Expected Discharge Plan: Home/Self Care   Discharge Planning Services: CM Consult   Living arrangements for the past 2 months: Apartment                 DME Arranged: N/A         HH Arranged: NA          Prior Living Arrangements/Services Living arrangements for the past 2 months: Apartment Lives with:: Spouse Patient language and need for interpreter reviewed:: Yes Do you feel safe going back to the place where you live?: Yes      Need for Family Participation in Patient Care: Yes (Comment) Care giver support system in place?: Yes (comment)   Criminal Activity/Legal Involvement Pertinent to Current Situation/Hospitalization: No - Comment as needed  Activities of Daily Living Home Assistive Devices/Equipment: Eyeglasses, Environmental consultant (specify type), CPAP(rolling walker) ADL Screening (condition at time of admission) Patient's cognitive ability adequate to safely complete daily activities?: Yes Is the patient deaf or have difficulty hearing?: No Does the patient have difficulty seeing, even when wearing  glasses/contacts?: No Does the patient have difficulty concentrating, remembering, or making decisions?: No Patient able to express need for assistance with ADLs?: Yes Does the patient have difficulty dressing or bathing?: No Independently performs ADLs?: Yes (appropriate for developmental age) Does the patient have difficulty walking or climbing stairs?: No Weakness of Legs: None Weakness of Arms/Hands: Both  Permission Sought/Granted   Permission granted to share information with : No              Emotional Assessment Appearance:: Appears stated age Attitude/Demeanor/Rapport: Engaged Affect (typically observed): Accepting Orientation: : Oriented to Self, Oriented to Place, Oriented to  Time, Oriented to Situation Alcohol / Substance Use: Not Applicable Psych Involvement: No (comment)  Admission diagnosis:  Cervical myelopathy ([place]) [G95.9] Central cervical cord injury, 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 Guillain Barr syndrome ([place]) 04/19/2019  . Upper extremity weakness 04/18/2019  . 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 Degenerative arthritis of knee, bilateral 09/02/2018  . Pulmonary HTN ([place]) 08/14/2018  . Insomnia 07/30/2016  . Carpal tunnel syndrome, bilateral 07/30/2016  . Lumbar radiculopathy 07/30/2016  . Physical exam 06/13/2016  . Obstructive sleep apnea 06/21/2014  . Personal history of malignant neoplasm of prostate 06/21/2014  . Dyslipidemia 04/27/2014  . Morbid obesity ([place]) 06/02/2013  . Frequent unifocal PVCs   . Coronary artery disease   .  Hypertension   . Diabetes ([place]) 10/27/2012   PCP:  [name], MD Pharmacy:   [place], [place] - [address] [facility]. [address] [place] [address] Phone:  [phone] Fax: [place], [place] - [place] [address] Phone: [phone] Fax: [phone]  [facility] [place], [place] [address] Phone: [phone] Fax: [phone]     Social Determinants of Health (SDOH) Interventions    Readmission Risk Interventions No flowsheet data found.

## 2019-04-22 NOTE — Progress Notes (Signed)
Progress Note    Travis [name]  [id number] DOB: 05/06/1955  DOA: 04/16/2019 PCP: [name], MD    Brief Narrative:     Medical records reviewed and are as summarized below:  Travis [name] is an 64 y.o. male with sudden onset of bilateral wrist weakness.  Etiology not clear but patient did have a lumbar puncture that showed elevated protein.  MRI of the C-spine consistent with C5/6 foraminal narrowing.  Patient was started on IVIG due to elevated protein on CSF.  Patient is on day 4 of 5 currently.  Patient will need referral to outpatient neurology for EMG/nerve conduction study as diagnosis is not clear.  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   -Work-up including MRI C-spine, MRI brain, CTA head and neck, and CK unrevealing.   -Patient is on fenofibrate and Crestor which could potentially contribute.  -Improving with IVIG: Day 4 of 5 -Neurology following -Discontinue fenofibrate on discharge. -PT eval requested -Referral placed for outpatient neurology  Controlled DM-2 with hyperglycemia and neuropathy: A1c 6.3% on 04/01/2019. -Continue SSI, Tradjenta, Metformin, Crestor and gabapentin  Chronic diastolic CHF: Echo in [id number] with EF of 60 to [id number], diastolic dysfunction and moderate pulmonary hypertension.  On Lasix 20 to 40 mg daily at home. -Resume home Lasix at 20 mg daily  Chronic CAD status post PCI in 1997: No anginal symptoms. -Continue home medications.  Essential hypertension:  -Continue home amlodipine, metoprolol XL and lisinopril  Dyslipidemia: Lipid panel in 11/2018 with LDL 69 and TG 92. -Discontinue fenofibrate at discharge.  Obstructive sleep apnea:  -Encourage compliance with  CPAP  Morbid obesity Body mass index is 48.25 kg/m.   Family Communication/Anticipated D/C date and plan/Code Status   DVT prophylaxis: Lovenox ordered. Code Status: Full Code.  Family Communication: Wife at bedside Disposition Plan: PT eval has been requested.  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  Patient from home with wife.  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) 148.2 kg   SpO2 97%   BMI 48.25 kg/m   General Appearance:    Alert, cooperative, no distress, appears stated age  Lungs:  Clear to auscultation bilaterally, respirations unlabored, on room air  Chest wall:    No tenderness or deformity  Heart:    Regular rate and rhythm, S1 and S2 normal, no murmur, rub   or gallop  Abdomen:     Soft, non-tender, bowel sounds active all four quadrants,    no masses, no organomegaly  Extremities:   Extremities normal, atraumatic, no cyanosis or edema  Skin:   Skin color, texture, turgor normal, no rashes or lesions  Neurologic:   CNII-XII intact. Normal strength, sensation and reflexes      throughout    Data Reviewed:   I have personally reviewed following labs  and imaging studies:  Labs: Labs show the following:   Basic Metabolic Panel: Recent Labs  Lab 04/16/19 0556 04/17/19 0047  NA 145  --   K 4.0  --   CL 105  --   CO2 26  --   GLUCOSE 138*  --   BUN 26*  --   CREATININE 1.07 0.95  CALCIUM 9.5  --    GFR Estimated Creatinine Clearance: 116 mL/min (by C-G formula based on SCr of 0.95 mg/dL). Liver Function Tests: Recent Labs  Lab 04/16/19 0556 04/17/19 1424  AST 27  --   ALT 31  --   ALKPHOS 56  --   BILITOT 0.5  --   PROT 6.1*  --   ALBUMIN 3.6 3.6*   No results for input(s): LIPASE, AMYLASE in the last 168 hours. No results for input(s): AMMONIA in the last 168 hours. Coagulation profile No results for input(s): INR, PROTIME in the last 168 hours.  CBC: Recent Labs  Lab 04/16/19 0556 04/17/19 0047  WBC 6.2 8.8  HGB 12.2* 12.6*  HCT 37.6* 38.8*  MCV 93.3 92.6  PLT 202 204   Cardiac Enzymes: Recent Labs  Lab 04/17/19 0011  CKTOTAL 93   BNP (last 3 results) No results for input(s): PROBNP in the last 8760 hours. CBG: Recent Labs  Lab 04/21/19 0611 04/21/19 1101 04/21/19 1625 04/21/19 2131 04/22/19 0610  GLUCAP 139* 115* 111* 132* 120*   D-Dimer: No results for input(s): DDIMER in the last 72 hours. Hgb A1c: No results for input(s): HGBA1C in the last 72 hours. Lipid Profile: No results for input(s): CHOL, HDL, LDLCALC, TRIG, CHOLHDL, LDLDIRECT in the last 72 hours. Thyroid function studies: No results for input(s): TSH, T4TOTAL, T3FREE, THYROIDAB in the last 72 hours.  Invalid input(s): FREET3 Anemia work up: No results for input(s): VITAMINB12, FOLATE, FERRITIN, TIBC, IRON, RETICCTPCT in the last 72 hours. Sepsis Labs: Recent Labs  Lab 04/16/19 0556 04/17/19 0047  WBC 6.2 8.8    Microbiology Recent Results (from the past 240 hour(s))  SARS CORONAVIRUS 2 (TAT 6-24 HRS) Nasopharyngeal Nasopharyngeal Swab     Status: None   Collection Time: 04/16/19  9:17 PM   Specimen:  Nasopharyngeal Swab  Result Value Ref Range Status   SARS Coronavirus 2 NEGATIVE NEGATIVE Final    Comment: (NOTE) SARS-CoV-2 target nucleic acids are NOT DETECTED. The SARS-CoV-2 RNA is generally detectable in upper and lower respiratory specimens during the acute phase of infection. Negative results do not preclude SARS-CoV-2 infection, do not rule out co-infections with other pathogens, and should not be used as the sole basis for treatment or other patient management decisions. Negative results must be combined with clinical observations, patient history, and epidemiological information. The expected result is Negative. Fact Sheet for Patients: SugarRoll.be Fact Sheet for Healthcare Providers:  https://www.woods-mathews.com/ This test is not yet approved or cleared by the [place] and  has been authorized for detection and/or diagnosis of SARS-CoV-2 by FDA under an Emergency Use Authorization (EUA). This EUA will remain  in effect (meaning this test can be used) for the duration of the COVID-19 declaration under Section 56 4(b)(1) of the Act, 21 U.S.C. section 360bbb-3(b)(1), unless the authorization is terminated or revoked sooner. Performed at [facility], [place] [address], [place], [place] [address]     Procedures and diagnostic studies:  No results found.  Medications:   . amLODipine  10 mg Oral Daily  . aspirin EC  81 mg Oral Daily  . enoxaparin (LOVENOX) injection  60 mg Subcutaneous Q24H  . feeding supplement (GLUCERNA SHAKE)  237 mL Oral TID BM  . furosemide  20 mg Oral Daily  . gabapentin  300 mg Oral TID  . insulin aspart  0-15 Units Subcutaneous TID WC  . linagliptin  5 mg Oral Daily  . lisinopril  40 mg Oral Daily  . metFORMIN  1,000 mg Oral BID WC  . metoprolol succinate  100 mg Oral Daily  . multivitamin with minerals  1 tablet Oral Daily  . rosuvastatin  5 mg Oral Daily   Continuous Infusions: .  Immune Globulin 10% 60 g (04/21/19 1220)     LOS: 3 days   Geradine Girt  [facility]   How to contact the [facility] Attending or Consulting provider [place] or covering provider during after hours [place], for this patient?  1. Check the care team in [facility] and look for a) attending/consulting [facility] provider listed and b) the [facility] team listed 2. Log into [email or web address] and use 's universal password to access. If you do not have the password, please contact the hospital operator. 3. Locate the [facility] provider you are looking for under [facility] and page to a number that you can be directly reached. 4. If you still have difficulty reaching the provider, please page the [facility] (Director on Call) for the Hospitalists listed on amion for assistance.  04/22/2019, 9:59 AM

## 2019-04-22 NOTE — Evaluation (Addendum)
Physical Therapy Evaluation Patient Details Name: Travis [name] MRN: [id number] DOB: Oct 07, 1955 Today's Date: 04/22/2019   History of Present Illness  Travis [name] is an 64 y.o. male with sudden onset of bilateral wrist weakness.  Etiology not clear but patient did have a lumbar puncture that showed elevated protein.  MRI of the C-spine consistent with C5/6 foraminal narrowing.  Patient was started on IVIG due to elevated protein on CSF.  Patient is on day 4 of 5 currently  Clinical Impression  Pt admitted with/for bil wrist weakness with work up suggesting an atypical presentation of GBS.  Pt at S to mod I level for mobility, but wrists, hands and fingers are his greatest weakness and may limit his ability to do his job..  Pt currently limited functionally due to the problems listed below.  Pt may need some follow up HHPT for hand function and general deconditioning overall to allow him to do his job, but safe enough to sign off at this time.     Follow Up Recommendations Outpatient PT;Other (comment)(maybe OPOT for hand function)    Equipment Recommendations  None recommended by PT    Recommendations for Other Services       Precautions / Restrictions Precautions Precautions: Fall;None(minimal fall risk)      Mobility  Bed Mobility Overal bed mobility: Modified Independent                Transfers Overall transfer level: Needs assistance   Transfers: Sit to/from Stand Sit to Stand: Modified independent (Device/Increase time);Supervision            Ambulation/Gait Ambulation/Gait assistance: Supervision Gait Distance (Feet): 200 Feet Assistive device: None Gait Pattern/deviations: Step-through pattern Gait velocity: slower Gait velocity interpretation: 1.31 - 2.62 ft/sec, indicative of limited community ambulator General Gait Details: steady, excessive lateral w/shift/wobble.  Stairs Stairs: (pt deferred)          Wheelchair Mobility    Modified  Rankin (Stroke Patients Only)       Balance Overall balance assessment: No apparent balance deficits (not formally assessed)                                           Pertinent Vitals/Pain Pain Assessment: No/denies pain    Home Living Family/patient expects to be discharged to:: Private residence Living Arrangements: Spouse/significant other Available Help at Discharge: Family;Available 24 hours/day Type of Home: Apartment Home Access: Level entry     Home Layout: One level Home Equipment: Grab bars - toilet;Grab bars - tub/shower;Walker - 4 wheels;Cane - single point      Prior Function Level of Independence: Independent         Comments: works in Medical sales representative at [employer], pushes, pulls, [employer], folds.     Hand Dominance   Dominant Hand: Right    Extremity/Trunk Assessment   Upper Extremity Assessment Upper Extremity Assessment: RUE deficits/detail;LUE deficits/detail RUE Deficits / Details: functional, but weak overall, grip strength 3-, wrist flexion 3/5 otherwise 4/5 LUE Deficits / Details: stronger than R , but grip 3- to 3/5, wrist 3/5 and otherwise grossly >4/5    Lower Extremity Assessment Lower Extremity Assessment: Overall WFL for tasks assessed(hip flexors, quads 4/5, hams >4/5 R stronger than L LE)       Communication   Communication: No difficulties  Cognition Arousal/Alertness: Awake/alert Behavior During Therapy: WFL for tasks assessed/performed Overall Cognitive  Status: Within Functional Limits for tasks assessed                                        General Comments General comments (skin integrity, edema, etc.): pt able to balance statically with narrowed bos and eyes closed, turn 360* safely, reach to floor.    Exercises     Assessment/Plan    PT Assessment All further PT needs can be met in the next venue of care  PT Problem List Decreased strength;Decreased activity tolerance;Decreased  mobility;Cardiopulmonary status limiting activity;Pain       PT Treatment Interventions      PT Goals (Current goals can be found in the Care Plan section)  Acute Rehab PT Goals Patient Stated Goal: get back to work if I can PT Goal Formulation: With patient/family Potential to Achieve Goals: Good    Frequency     Barriers to discharge        Co-evaluation               AM-PAC PT "6 Clicks" Mobility  Outcome Measure Help needed turning from your back to your side while in a flat bed without using bedrails?: None Help needed moving from lying on your back to sitting on the side of a flat bed without using bedrails?: None Help needed moving to and from a bed to a chair (including a wheelchair)?: None Help needed standing up from a chair using your arms (e.g., wheelchair or bedside chair)?: None Help needed to walk in hospital room?: None Help needed climbing 3-5 steps with a railing? : A Lot 6 Click Score: 22    End of Session   Activity Tolerance: Patient tolerated treatment well;Other (comment)(some dyspnea) Patient left: in bed;with call bell/phone within reach;with family/visitor present Nurse Communication: Mobility status PT Visit Diagnosis: Other symptoms and signs involving the nervous system (R29.898);Other abnormalities of gait and mobility (R26.89)    Time: 1014-1100 PT Time Calculation (min) (ACUTE ONLY): 46 min   Charges:   PT Evaluation $PT Eval Moderate Complexity: 1 Mod PT Treatments $Gait Training: 8-22 mins $Therapeutic Activity: 8-22 mins        04/22/2019  Ginger Carne., PT Acute Rehabilitation Services [phone]  (pager) [phone]  (office)  Tessie Fass Sundae Maners 04/22/2019, 2:25 PM

## 2019-04-23 LAB — GLUCOSE, CAPILLARY
Glucose-Capillary: 118 mg/dL — ABNORMAL HIGH (ref 70–99)
Glucose-Capillary: 96 mg/dL (ref 70–99)

## 2019-04-23 MED ORDER — FUROSEMIDE 20 MG PO TABS: 20.0000 mg | ORAL_TABLET | Freq: Every day | ORAL | Status: DC | PRN

## 2019-04-23 NOTE — Progress Notes (Signed)
RN gave pt discharge instructions and he stated understanding. IV has been removed, pt is taking a shower and has called his ride. Mother at bedside

## 2019-04-23 NOTE — Discharge Summary (Signed)
Physician Discharge Summary  Travis [name] [id number] DOB: 06-17-1955 DOA: 04/16/2019  PCP: [name], MD  Admit date: 04/16/2019 Discharge date: 04/23/2019  Admitted From: Home Discharge disposition: Home   Recommendations for Outpatient Follow-Up:   1. Outpatient PT 2. Ambulatory referral to neurology for EMG/nerve conduction study   Discharge Diagnosis:   Principal Problem:   Upper extremity weakness Active Problems:   Diabetes ([place])   Hypertension   Morbid obesity ([place])   Dyslipidemia   Obstructive sleep apnea   Guillain Barr syndrome ([place])    Discharge Condition: Improved.  Diet recommendation: Low sodium, heart healthy  Wound care: None.  Code status: Full.   History of Present Illness:  Travis [name] is a 64 year old right-handed individual who tells me that this morning he was working when he noted that he could not open his hands very well on either side.  He denies any trauma but he notes that he could grasp with his fingers but he could not open his hands to stretch the fingers out he also felt some weakness in his arms his legs felt strong and he was able to move about but because of this problem he came to the emergency department for further work-up and an MRI of the neck was obtained.  The study demonstrates that the patient has some cervical stenosis at C4-5 and also at C5-6 and there is a small lesion in the center of the cord that would be consistent with a small contusion or some myelomalacia nonetheless no overt compression of the cord that is active is present.  The patient was further evaluated with a flexion-extension film of the neck and no abnormal motion is noted across the levels of C3-C4-C5 and C6.  I have evaluated the patient note that he has weakness in the finger extensors of the wrist extensors the grip strength in the intrinsics on both hands his triceps are also weak to 4 out of 5 but his bicep strength appears intact deltoid  strength is intact also.  His deep tendon reflexes are absent in the bicep and tricep 1+ in the patellae absent in the Achilles.  Lower extremity strength appears intact.    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  -Work-up including MRI C-spine, MRI brain, CTA head and neck, and CK unrevealing.  -Patient is on fenofibrate and Crestor which could potentially contribute.  -Improving with IVIG: Day 5 of 5 -Neurology following -Discontinue fenofibrate on discharge. -PT eval-- outpatient PT/OT -Referral placed for outpatient neurology  Controlled DM-2 with hyperglycemia and neuropathy: A1c 6.3% on 04/01/2019. -Continue SSI, Tradjenta, Metformin, Crestor and gabapentin  Chronic diastolic CHF: Echo in [id number] with EF of 60 to [id number], diastolic dysfunction and moderate pulmonary hypertension.  -continue home lasix  Chronic CAD status post PCI in 1997: No anginal symptoms. -Continue home medications.  Essential hypertension:  -Continue home amlodipine, metoprolol XL and lisinopril  Dyslipidemia: Lipid panel in 11/2018 with LDL 69 and TG 92. -Discontinue fenofibrate at discharge.  Obstructive sleep apnea:  -Encourage compliance with CPAP  Morbid obesity Body mass index is 48.25 kg/m.     Medical Consultants:    Neurology Neurosurgery  Discharge Exam:   Vitals:   04/23/19 0007 04/23/19 0624  BP: 137/81 127/80  Pulse: 77 81  Resp: 17 16  Temp: 97.7 F (36.5 C) 97.8 F (36.6 C)  SpO2: 95% 99%   Vitals:   04/22/19 1439 04/22/19  1822 04/23/19 0007 04/23/19 0624  BP: 126/69 116/72 137/81 127/80  Pulse: 84 82 77 81  Resp: 18 16 17 16   Temp:  98.6 F (37 C) 97.7 F (36.5 C) 97.8 F (36.6 C)  TempSrc:  Oral Oral Oral  SpO2: 94% 96% 95% 99%  Weight:      Height:        General exam: Appears calm and comfortable.  The results of significant diagnostics from this hospitalization  (including imaging, microbiology, ancillary and laboratory) are listed below for reference.     Procedures and Diagnostic Studies:   DG Cervical Spine With Flex & Extend  Result Date: 04/16/2019 CLINICAL DATA:  Bilateral hand numbness. EXAM: CERVICAL SPINE COMPLETE WITH FLEXION AND EXTENSION VIEWS COMPARISON:  MRI cervical spine from same day. FINDINGS: The lateral view is diagnostic to the C7 level. There is no acute fracture or subluxation. Vertebral body heights are preserved. Trace anterolisthesis at C3-C4 with flexion. Alignment is otherwise normal. Moderate to severe disc height loss at C5-C6 and C6-C7. Mild disc height loss at C4-C5. Mild right C4-C5 and moderate left C5-C6 and C6-C7 neuroforaminal stenosis due to uncovertebral hypertrophy. Normal prevertebral soft tissues. IMPRESSION: 1. Trace anterolisthesis at C3-C4 with flexion. 2. Multilevel cervical spondylosis as described above, greatest at C5-C6 and C6-C7. Electronically Signed   By: Titus Dubin M.D.   On: 04/16/2019 14:49   MR BRAIN WO CONTRAST  Result Date: 04/16/2019 CLINICAL DATA:  Sudden onset bilateral hand weakness EXAM: MRI HEAD WITHOUT CONTRAST TECHNIQUE: Multiplanar, multiecho pulse sequences of the brain and surrounding structures were obtained without intravenous contrast. COMPARISON:  None. FINDINGS: BRAIN: No acute infarct, acute hemorrhage or extra-axial collection. Normal white matter signal for age. Normal volume of brain parenchyma and CSF spaces. Midline structures are normal. VASCULAR: Major flow voids are preserved. Susceptibility-sensitive sequences show no chronic microhemorrhage or superficial siderosis. SKULL AND UPPER CERVICAL SPINE: Normal calvarium and skull base. Visualized upper cervical spine and soft tissues are normal. SINUSES/ORBITS: No paranasal sinus fluid levels or advanced mucosal thickening. No mastoid or middle ear effusion. Normal orbits. IMPRESSION: Normal brain MRI. Electronically Signed   By:  Ulyses Jarred M.D.   On: 04/16/2019 23:35   MR Cervical Spine Wo Contrast  Result Date: 04/16/2019 CLINICAL DATA:  Acute bilateral hand numbness and weakness. EXAM: MRI CERVICAL SPINE WITHOUT CONTRAST TECHNIQUE: Multiplanar, multisequence MR imaging of the cervical spine was performed. No intravenous contrast was administered. COMPARISON:  None. FINDINGS: Alignment: Physiologic. Vertebrae: No fracture, evidence of discitis, or bone lesion. Cord: Tiny focal area abnormal signal in the cervical spinal cord centrally and to the left at C5-6 seen on images 17 and 18 of series 13, likely due to a small focal disc protrusion with accompanying osteophytes. Posterior Fossa, vertebral arteries, paraspinal tissues: Negative. Disc levels: C2-3: Normal. C3-4: Tiny central disc bulge with no neural impingement. Moderate left facet arthritis. No foraminal stenosis. C4-5: Broad-based disc osteophyte complex asymmetric to the right encroaching upon the right lateral recess and right neural foramen which could affect the right C5 nerve. No significant facet arthritis. C5-6: Disc space narrowing. Broad-based disc osteophyte complex asymmetric to the left. This compresses the ventral aspect of the left side of the spinal cord with slight myelopathy in the cord at that level. This could affect the left C6 nerve. Severe left foraminal stenosis. Moderate right facet arthritis. C6-7: Disc space narrowing. Prominent broad-based disc osteophyte complex slightly asymmetric to the right. Bilateral foraminal stenosis which could affect either  or both C7 nerves. C7-T1: Tiny disc bulge to the left of midline without neural impingement. No foraminal stenosis. T1-2: Focal disc protrusion to the right of midline into the right lateral recess and right neural foramen which could affect the right T1 nerve. IMPRESSION: 1. Soft disc protrusion at T1-2 to the right which could affect the right T1 nerve. 2. Right lateral recess and right foraminal  impingement at C4-5 which could affect the right C5 nerve. 3. Left lateral recess and foraminal stenosis at C5-6 which could affect the left C6 nerve. 4. Bilateral foraminal stenosis at C6-7 which could affect either or both C7 nerves. 5. 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 Electronically Signed   By: Lorriane Shire M.D.   On: 04/16/2019 12:06   DG FL GUIDED LUMBAR PUNCTURE  Result Date: 04/17/2019 CLINICAL DATA:  Arm weakness EXAM: DIAGNOSTIC LUMBAR PUNCTURE UNDER FLUOROSCOPIC GUIDANCE FLUOROSCOPY TIME:  48 seconds; 199  uGym2 DAP PROCEDURE: Informed consent was obtained from the patient prior to the procedure, including potential complications of headache, allergy, and pain. Operators: Woodward Ku With the patient prone, the lower back was prepped with Betadine and chlorhexidine. 1% Lidocaine was used for local anesthesia. Lumbar puncture was performed at the L3 level from a left parasagittal approach using a 5 inch 20 gauge needle with return of clear colorless CSF with an opening pressure of 13 cm water. 7 ml of CSF were obtained for laboratory studies. The patient tolerated the procedure well and there were no apparent complications. IMPRESSION: Technically successful lumbar puncture under fluoroscopy. Electronically Signed   By: Lucrezia Europe M.D.   On: 04/17/2019 14:40     Labs:   Basic Metabolic Panel: Recent Labs  Lab 04/17/19 0047  CREATININE 0.95   GFR Estimated Creatinine Clearance: 116 mL/min (by C-G formula based on SCr of 0.95 mg/dL). Liver Function Tests: Recent Labs  Lab 04/17/19 1424  ALBUMIN 3.6*   No results for input(s): LIPASE, AMYLASE in the last 168 hours. No results for input(s): AMMONIA in the last 168 hours. Coagulation profile No results for input(s): INR, PROTIME in the last 168 hours.  CBC: Recent Labs  Lab 04/17/19 0047  WBC 8.8  HGB 12.6*  HCT 38.8*  MCV 92.6  PLT 204   Cardiac Enzymes: Recent Labs  Lab  04/17/19 0011  CKTOTAL 93   BNP: Invalid input(s): POCBNP CBG: Recent Labs  Lab 04/22/19 0610 04/22/19 1111 04/22/19 1623 04/22/19 2132 04/23/19 0620  GLUCAP 120* 118* 104* 105* 118*   D-Dimer No results for input(s): DDIMER in the last 72 hours. Hgb A1c No results for input(s): HGBA1C in the last 72 hours. Lipid Profile No results for input(s): CHOL, HDL, LDLCALC, TRIG, CHOLHDL, LDLDIRECT in the last 72 hours. Thyroid function studies No results for input(s): TSH, T4TOTAL, T3FREE, THYROIDAB in the last 72 hours.  Invalid input(s): FREET3 Anemia work up No results for input(s): VITAMINB12, FOLATE, FERRITIN, TIBC, IRON, RETICCTPCT in the last 72 hours. Microbiology Recent Results (from the past 240 hour(s))  SARS CORONAVIRUS 2 (TAT 6-24 HRS) Nasopharyngeal Nasopharyngeal Swab     Status: None   Collection Time: 04/16/19  9:17 PM   Specimen: Nasopharyngeal Swab  Result Value Ref Range Status   SARS Coronavirus 2 NEGATIVE NEGATIVE Final    Comment: (NOTE) SARS-CoV-2 target nucleic acids are NOT DETECTED. The SARS-CoV-2 RNA is generally detectable in upper and lower respiratory specimens during the acute phase of infection. Negative  results do not preclude SARS-CoV-2 infection, do not rule out co-infections with other pathogens, and should not be used as the sole basis for treatment or other patient management decisions. Negative results must be combined with clinical observations, patient history, and epidemiological information. The expected result is Negative. Fact Sheet for Patients: SugarRoll.be Fact Sheet for Healthcare Providers: https://www.woods-mathews.com/ This test is not yet approved or cleared by the [place] FDA and  has been authorized for detection and/or diagnosis of SARS-CoV-2 by FDA under an Emergency Use Authorization (EUA). This EUA will remain  in effect (meaning this test can be used) for the duration  of the COVID-19 declaration under Section 56 4(b)(1) of the Act, 21 U.S.C. section 360bbb-3(b)(1), unless the authorization is terminated or revoked sooner. Performed at [facility], [place] [address], [place], [place] [address]      Discharge Instructions:   Discharge Instructions    Ambulatory referral to Neurology   Complete by: As directed    An appointment is requested in approximately: 1-2 weeks ? EMG/NCS   Ambulatory referral to Occupational Therapy   Complete by: As directed    Ambulatory referral to Physical Therapy   Complete by: As directed    Diet - low sodium heart healthy   Complete by: As directed    Diet Carb Modified   Complete by: As directed    Increase activity slowly   Complete by: As directed      Allergies as of 04/23/2019      Reactions   Crestor [rosuvastatin]    Muscle Pain   Lipitor [atorvastatin] Other (See Comments)   Arm weakness      Medication List    STOP taking these medications   fenofibrate micronized 200 MG capsule Commonly known as: LOFIBRA   meloxicam 15 MG tablet Commonly known as: MOBIC     TAKE these medications   acetaminophen 500 MG tablet Commonly known as: TYLENOL Take 1,000 mg by mouth every 6 (six) hours as needed for moderate pain.   albuterol 108 (90 Base) MCG/ACT inhaler Commonly known as: VENTOLIN HFA Inhale 2 puffs into the lungs every 6 (six) hours as needed for wheezing or shortness of breath.   amLODipine 10 MG tablet Commonly known as: NORVASC TAKE 1 TABLET BY MOUTH DAILY.   aspirin EC 81 MG tablet Take 81 mg by mouth daily.   furosemide 20 MG tablet Commonly known as: LASIX Take 1-2 tablets (20-40 mg total) by mouth daily as needed for edema. What changed: See the new instructions.   gabapentin 300 MG capsule Commonly known as: NEURONTIN TAKE 1 CAPSULE BY MOUTH 3 TIMES DAILY   GERITOL PO Take by mouth.   Januvia 100 MG tablet Generic drug: sitaGLIPtin TAKE 1 TABLET BY MOUTH  DAILY.   lisinopril 40 MG tablet Commonly known as: ZESTRIL TAKE 1 TABLET BY MOUTH DAILY.   magnesium gluconate 500 MG tablet Commonly known as: MAGONATE Take 500 mg by mouth daily.   metFORMIN 500 MG tablet Commonly known as: GLUCOPHAGE TAKE 2 TABLETS BY MOUTH 2 TIMES DAILY WITH A MEAL. What changed: See the new instructions.   metoprolol succinate 100 MG 24 hr tablet Commonly known as: TOPROL-XL Take 1 tablet (100 mg total) by mouth daily.   Potassium 95 MG Tabs Take 95 mg by mouth daily.   rosuvastatin 5 MG tablet Commonly known as: CRESTOR Take 5 mg by mouth daily. What changed: Another medication with the same name was removed. Continue taking  this medication, and follow the directions you see here.   TART CHERRY ADVANCED PO Take 1 tablet by mouth daily.   traZODone 50 MG tablet Commonly known as: DESYREL TAKE 1/2 TO 1 TABLET BY MOUTH AT BEDTIME AS NEEDED FOR SLEEP   True Metrix Blood Glucose Test test strip Generic drug: glucose blood   TRUEplus Lancets 30G Misc   Turmeric Curcumin 500 MG Caps Take by mouth.   VITAMIN D3 PO Take 1 tablet by mouth daily.   zinc gluconate 50 MG tablet Take 50 mg by mouth daily.      Follow-up Information    [facility]. Schedule an appointment as soon as possible for a visit.   Specialty: Rehabilitation Contact information: [address] [place] [id number] [place] [id number] [phone]           Time coordinating discharge: 35 min  Signed:  Geradine Girt DO  [facility] 04/23/2019, 9:34 AM

## 2019-04-24 ENCOUNTER — Other Ambulatory Visit: Payer: Self-pay | Admitting: *Deleted

## 2019-04-24 NOTE — Patient Outreach (Signed)
[facility]) Care Management  04/24/2019  OZIEL BULLA 12-15-1955 [id number]  Transition of care call  Referral received: 04/24/2019 Initial outreach: 04/24/2019 Insurance: [employer]   Subjective: Initial successful telephone call to patient's preferred number in order to complete transition of care assessment; 2 HIPAA identifiers verified. Explained purpose of call and completed transition of care assessment.  States he is doing well, denies any problems, state he has all prescribed medications, tolerating diet, denies bowel or bladder problems.  Spouse is assisting with his recovery.  He denies any ongoing health issues and says he does not need a referral to one of the [place] chronic disease management programs.  He denies educational needs related to staying safe during the COVID 19 pandemic.  He reports he is awaiting for out-patient and neuro rehabilitation to call to set up an appointment. Verified pt has the contact number and will follow up on Monday if no call today for these arrangements.    Objective:  Mr. Holyfield was hospitalized at [facility] from 4/1 for Comorbidities include: Bilateral upper extremity weakness, DM, CHF, HTN Dyslipidemia, OSA and Obesity. He was discharged to home on 04/23/2019 with orders for both out-patient and neuro-rehabilitation.   Assessment:  Patient voices good understanding of all discharge instructions.    Plan:  Reviewed hospital discharge diagnosis of Cervical myelopathy and treatment plan using hospital discharge instructions, and primary care provider and specialists as directed. Reviewed [place] healthy lifestyle program information to receive discounted premium for  2022  Step 1: Get annual physical between January 15, 2018 and July 16, 2019; Step 2: Complete your health assessment between January 16, 2019 and September 16, 2019 at [email or web address] Step 3:Identify your current health status and  complete the corresponding action step between January 1, and September 16, 2019.   Using [employer] ActiveAdvice -pt reports he was very active prior to this event and logging all activizes. No ongoing care management needs identified so will close case to [facility] services. Pt has requested [place] Management pamphlet to be mailed to patient's home address.    Raina Mina, RN Care Management Coordinator [place] Office [phone]

## 2019-04-27 ENCOUNTER — Other Ambulatory Visit: Payer: Self-pay

## 2019-04-27 ENCOUNTER — Ambulatory Visit (INDEPENDENT_AMBULATORY_CARE_PROVIDER_SITE_OTHER): Payer: 59 | Admitting: Family Medicine

## 2019-04-27 ENCOUNTER — Encounter: Payer: Self-pay | Admitting: Family Medicine

## 2019-04-27 VITALS — BP 126/80 | HR 81 | Temp 97.6°F | Resp 16 | Ht 71.0 in | Wt 326.4 lb

## 2019-04-27 DIAGNOSIS — S01311A Laceration without foreign body of right ear, initial encounter: Secondary | ICD-10-CM | POA: Diagnosis not present

## 2019-04-27 DIAGNOSIS — S14121D Central cord syndrome at C1 level of cervical spinal cord, subsequent encounter: Secondary | ICD-10-CM | POA: Diagnosis not present

## 2019-04-27 DIAGNOSIS — E1169 Type 2 diabetes mellitus with other specified complication: Secondary | ICD-10-CM | POA: Diagnosis not present

## 2019-04-27 DIAGNOSIS — G610 Guillain-Barre syndrome: Secondary | ICD-10-CM | POA: Diagnosis not present

## 2019-04-27 DIAGNOSIS — J309 Allergic rhinitis, unspecified: Secondary | ICD-10-CM

## 2019-04-27 DIAGNOSIS — E785 Hyperlipidemia, unspecified: Secondary | ICD-10-CM | POA: Diagnosis not present

## 2019-04-27 DIAGNOSIS — Z6841 Body Mass Index (BMI) 40.0 and over, adult: Secondary | ICD-10-CM

## 2019-04-27 NOTE — Assessment & Plan Note (Signed)
Seen on imaging, this was felt to be incidental finding/artifact.  Neurosurg reviewed and felt no further workup or intervention was needed

## 2019-04-27 NOTE — Assessment & Plan Note (Signed)
New.  Pt was admitted w/ what was felt to be atypical GBS and treated w/ 5 days of IVIG w/ improvement of his sxs.  He has upcoming appts w/ PT/OT and is waiting on neuro referral for possible EMG/NCS.  At this time, is asymptomatic.  Will continue to follow.

## 2019-04-27 NOTE — Assessment & Plan Note (Signed)
Pt is down 19 lbs since last visit.  Encouraged him to continue his healthy eating habits/diet choices.  Will follow.

## 2019-04-27 NOTE — Progress Notes (Signed)
Subjective:    Patient ID: Travis [name], male    DOB: 04-16-1955, 64 y.o.   MRN: [id number]  [facility] f/u- pt was admitted 4/1-8 w/ suspected Guillain-Barre syndrome.  IVIG was started on 4/4 and symptoms of upper extremity weakness improved.  He completed 5 days of IVIG and was recommended to have outpt PT/OT and neurology f/u.  Has PT/OT appts scheduled but still waiting on neurology appt.  It was recommended that he d/c Fenofibrate and Meloxicam.  No other med changes were made.    Pt reports weakness and hand functioning is 'much better'.  Pt states fingers are no longer tingling.  Pt states that the 'soles of my feet have felt a little funny a few times since I've been home'.  Reports this resolves spontaneously.    Obesity- pt has lost 19 lbs since last visit.  Sinus pressure- sxs started this AM.  Not taking any allergy medication at this time.  No fevers.  R ear wound- first appeared due to CPAP strap as he sleeps on R side.  Has been unhealing for 'several years'.  Would scab and improve and then 'open up again'.  Reviewed H&P, hospital notes, consults notes, labs, images, and DC summary   Review of Systems For ROS see HPI   This visit occurred during the SARS-CoV-2 public health emergency.  Safety protocols were in place, including screening questions prior to the visit, additional usage of staff PPE, and extensive cleaning of exam room while observing appropriate contact time as indicated for disinfecting solutions.       Objective:   Physical Exam Vitals reviewed.  Constitutional:      General: He is not in acute distress.    Appearance: He is well-developed. He is obese.  HENT:     Head: Normocephalic and atraumatic.     Left Ear: External ear normal.     Ears:     Comments: Superficial laceration/wound on R helix w/o sign of infxn Eyes:     Conjunctiva/sclera: Conjunctivae normal.     Pupils: Pupils are equal, round, and reactive to light.  Neck:   Thyroid: No thyromegaly.  Cardiovascular:     Rate and Rhythm: Normal rate and regular rhythm.     Heart sounds: Normal heart sounds. No murmur.  Pulmonary:     Effort: Pulmonary effort is normal. No respiratory distress.     Breath sounds: Normal breath sounds.  Abdominal:     General: Bowel sounds are normal. There is no distension.     Palpations: Abdomen is soft.  Musculoskeletal:     Cervical back: Normal range of motion and neck supple.  Lymphadenopathy:     Cervical: No cervical adenopathy.  Skin:    General: Skin is warm and dry.  Neurological:     Mental Status: He is alert and oriented to person, place, and time.     Cranial Nerves: No cranial nerve deficit.     Sensory: No sensory deficit.     Coordination: Coordination normal.  Psychiatric:        Behavior: Behavior normal.           Assessment & Plan:  Allergic sinusitis- new.  No evidence of infxn.  Encouraged restart of daily antihistamine.  Pt expressed understanding and is in agreement w/ plan.   Ear wound- new to provider, ongoing for pt.  He reports this has been present 'for years'.  States that wound will scab and appear to heal  and then 'bust open'.  He states this is due to his CPAP mask/strap.  Since wound won't heal, will refer to plastic surgery.

## 2019-04-27 NOTE — Assessment & Plan Note (Signed)
Chronic problem.  Pt continues Crestor.  Fenofibrate was stopped at D/C.  Will continue to follow at future visits

## 2019-04-27 NOTE — Patient Instructions (Addendum)
Follow up as needed or as scheduled If you don't hear from Neuro, please let me know so we can get an appt scheduled Continue to work on healthy diet and exercise as you are able (per PT/OT recommendations) Return to work based on what Neuro and PT say We'll call you with your Plastic Surgery appt to assess the ear RESTART daily Claritin or Zyrtec to improve your sinus pressure Call with any questions or concerns Hang in there!!

## 2019-05-04 ENCOUNTER — Ambulatory Visit: Payer: 59 | Attending: Family Medicine

## 2019-05-04 ENCOUNTER — Other Ambulatory Visit: Payer: Self-pay

## 2019-05-04 DIAGNOSIS — R2681 Unsteadiness on feet: Secondary | ICD-10-CM | POA: Insufficient documentation

## 2019-05-04 DIAGNOSIS — M6281 Muscle weakness (generalized): Secondary | ICD-10-CM | POA: Insufficient documentation

## 2019-05-04 NOTE — Therapy (Signed)
[facility] [address] [place], [place], [address] Phone: [phone]   Fax:  [phone]  Physical Therapy Evaluation  Patient Details  Name: Travis [name] MRN: [id number] Date of Birth: [birth year] Referring Provider (PT): Dr. Eulogio Bear   Encounter Date: 05/04/2019  PT End of Session - 05/04/19 1050    Visit Number  1    Number of Visits  1    PT Start Time  0845    PT Stop Time  0930    PT Time Calculation (min)  45 min    Equipment Utilized During Treatment  Gait belt    Activity Tolerance  Patient tolerated treatment well    Behavior During Therapy  [facility] for tasks assessed/performed       Past Medical History:  Diagnosis Date  . Appendicitis   . Blood in stool   . Coronary artery disease 1997   s/p PCI by Dr. Glade Lloyd  . Diverticulosis of colon with hemorrhage 2009  . Dyslipidemia, goal LDL below 70   . Edema   . Encounter for long-term (current) use of other medications   . Essential hypertension, benign   . Family history of thyroid disease   . Frequent unifocal PVCs   . Morbid obesity ([place])   . Myalgia and myositis, unspecified   . Prostate cancer ([place]) 2012  . Pulmonary HTN ([place]) 08/14/2018   PASP 30mmHg by echo 2017  . Sleep apnea with use of continuous positive airway pressure (CPAP)   . Thrombocytopenia ([place]) 05/25/2014  . Type II or unspecified type diabetes mellitus without mention of complication, not stated as uncontrolled     Past Surgical History:  Procedure Laterality Date  . ANGIOPLASTY  1997   Dr. Glade Lloyd  . APPENDECTOMY      There were no vitals filed for this visit.   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 Prior to his admission ED, he was given new task of folding and stacking linens. He reported that he felt sudden weakness in bil hands and  he wasn't able to open his hands. He reports that he has been feeling gradual weakness over the year. May be folding linens which were heavier than scrubs casued sudden weakness. He was taken to ED and had MRI and lumbar puncture and was diagnosed with GBS. He was given IvIg greatment in the hospital where he thinks his symptoms have improve dto 90-95%. He never had any symptoms in his legs. He reports of chronic issues with his back and bil knee DJD. He normally gets shots in his knees every 3 months for pain management. He feels that he is ready to return back to work.    Pertinent History  DM, CAD, HTN, obesity    Diagnostic tests  IMPRESSION:1. Soft disc protrusion at T1-2 to the right which could affect theright T1 nerve.2. Right lateral recess and right foraminal impingement at C4-5which could affect the right C5 nerve.3. Left lateral recess and foraminal stenosis at C5-6 which couldaffect the left C6 nerve.4. Bilateral foraminal stenosis at C6-7 which could    Patient Stated Goals  "get back to work"    Currently in Pain?  No/denies    Pain Score  0-No pain         [facility] PT Assessment - 05/04/19 1038      Assessment  Medical Diagnosis  Guilain barre syndrome    Referring Provider (PT)  Dr. Eulogio Bear    Onset Date/Surgical Date  04/16/19    Hand Dominance  Right      Precautions   Precautions  None      Restrictions   Weight Bearing Restrictions  No      Balance Screen   Has the patient fallen in the past 6 months  No      [place] residence    Living Arrangements  Spouse/significant other    Available Help at Discharge  Family    Type of [place] Access  Level entry    Home Layout  One level    [place] - 4 wheels;[place]ane - single point      Prior Function   Level of Independence  Independent      Cognition   Overall Cognitive Status  Within Functional Limits for tasks assessed    Attention  Focused     Focused Attention  Appears intact    Memory  Appears intact    Awareness  Appears intact    Problem Solving  Appears intact      Strength   Right Hip Flexion  5/5    Right Hip ABduction  5/5    Right Knee Flexion  5/5    Right Knee Extension  5/5    Left Knee Flexion  5/5    Left Knee Extension  5/5    Right Ankle Dorsiflexion  5/5    Left Ankle Dorsiflexion  5/5      Transfers   Transfers  Sit to Stand;Stand to Sit    Sit to Stand  7: Independent    Stand to Sit  7: Independent    Number of Reps  10 reps      Ambulation/Gait   Ambulation/Gait  Yes    Ambulation/Gait Assistance  6: Modified independent (Device/Increase time)    Ambulation Distance (Feet)  860 Feet    Assistive device  4-wheeled walker    Gait Pattern  Within Functional Limits    Ambulation Surface  Level      6 Minute Walk- Baseline   6 Minute Walk- Baseline  yes      6 minute walk test results    Aerobic Endurance Distance Walked  860        5x sit to stand: 13 sec  Therapeutic activity: Standing moving 10lb dumbell from seat of the chair to cart in front (at chest height): 10x each direction without holding on to support surface 10 sit to stand: no HHA       Objective measurements completed on examination: See above findings.              PT Education - 05/04/19 1045    Education Details  Pt educated on session findings. Patient educated on gradually working on his cardiopulmonary endurance by doing timed walking 1-2x/day with his walker. Pt educated to start it with 10 min walk and graudally progress it as tolerated.    Person(s) Educated  Patient    Methods  Explanation    Comprehension  Verbalized understanding          PT Long Term Goals - 05/04/19 1046      PT LONG TERM GOAL #1   Title  Patient will demo understading of walking program to work on his walking endurance with  his walker.    Time  1    Period  Weeks    Status  Achieved    Target Date  05/04/19              Plan - 05/04/19 1047    Clinical Impression Statement  Patient is a 64 y.o. male who was seen today for physical therapy evaluation and treatment after hospitalization due to GBS. Patient currently demonstrates [facility] of strength in bil legs and WFL of walking endurance. 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 Patient does not need skilled physical therapy at this time. Patient will be dischraged from skiled physical therapy to work independently with self directed gradual walking program.    Personal Factors and Comorbidities  Fitness    Stability/Clinical Decision Making  Stable/Uncomplicated    Clinical Decision Making  Low    Rehab Potential  Excellent    PT Frequency  One time visit    PT Treatment/Interventions  Patient/family education    PT Home Exercise Plan  Walking program    Consulted and Agree with Plan of Care  Patient       Patient will benefit from skilled therapeutic intervention in order to improve the following deficits and impairments:     Visit Diagnosis: Unsteadiness on feet     Problem List Patient Active Problem List   Diagnosis Date Noted  . Guillain Barr syndrome ([place]) 04/19/2019  . Upper extremity weakness 04/18/2019  . Central cervical cord injury, without spinal bony injury, C1-4 ([place]) 04/16/2019  . 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 Degenerative arthritis of knee, bilateral 09/02/2018  . Pulmonary HTN ([place]) 08/14/2018  . Insomnia 07/30/2016  . Carpal tunnel syndrome, bilateral 07/30/2016  . Lumbar radiculopathy 07/30/2016  . Physical exam 06/13/2016  . Obstructive sleep apnea 06/21/2014  . Personal history of malignant neoplasm of prostate 06/21/2014  . Hyperlipidemia associated with type 2 diabetes mellitus ([place]) 04/27/2014  . Morbid obesity ([place]) 06/02/2013  . Frequent unifocal PVCs   . Coronary  artery disease   . Hypertension   . Diabetes ([place]) 10/27/2012    Kerrie Pleasure 05/04/2019, 10:52 AM  [facility] [address] [place], [place], [address] Phone: [phone]   Fax:  [phone]  Name: Travis [name] MRN: [id number] Date of Birth: 1956/01/02

## 2019-05-05 MED FILL — JANUVIA 100 MG TABLET: 100 | 30 days supply | Qty: 30 | Fill #2

## 2019-05-06 ENCOUNTER — Ambulatory Visit: Payer: 59 | Admitting: Occupational Therapy

## 2019-05-06 ENCOUNTER — Other Ambulatory Visit: Payer: Self-pay

## 2019-05-06 ENCOUNTER — Telehealth: Payer: Self-pay | Admitting: Family Medicine

## 2019-05-06 ENCOUNTER — Encounter: Payer: Self-pay | Admitting: Family Medicine

## 2019-05-06 ENCOUNTER — Encounter: Payer: Self-pay | Admitting: Occupational Therapy

## 2019-05-06 DIAGNOSIS — M6281 Muscle weakness (generalized): Secondary | ICD-10-CM

## 2019-05-06 DIAGNOSIS — R2681 Unsteadiness on feet: Secondary | ICD-10-CM | POA: Diagnosis not present

## 2019-05-06 NOTE — Telephone Encounter (Signed)
Patient notified of PCP recommendations and is agreement and expresses an understanding.  

## 2019-05-06 NOTE — Telephone Encounter (Signed)
Travis [name] walked into the office this morning and dropped off FMLA paperwork, asked if it could be faxed - also wanted to know if Dr.Tabori would write him a letter to return back to work on Monday 05/11/19

## 2019-05-06 NOTE — Telephone Encounter (Signed)
Pt just saw OT today and they cleared him to return.  Ok to write letter clearing him to return to work on Monday 4/26 and I will complete FMLA papers to reflect this

## 2019-05-06 NOTE — Therapy (Signed)
[facility] [address] [place], [place], [address] Phone: [phone]   Fax:  [phone]  Occupational Therapy Evaluation  Patient Details  Name: Travis [name] MRN: [id number] Date of Birth: Feb 03, 1955 Referring Provider (OT): Dr. Eulogio Bear;  Dr. Mable Paris (PCP)   Encounter Date: 05/06/2019  OT End of Session - 05/06/19 0913    Visit Number  1    Number of Visits  1    Date for OT Re-Evaluation  --   eval only   Authorization Type  [place] [employer]    OT Start Time  [phone]    OT Stop Time  0852    OT Time Calculation (min)  46 min    Activity Tolerance  Patient tolerated treatment well    Behavior During Therapy  [facility] for tasks assessed/performed       Past Medical History:  Diagnosis Date  . Appendicitis   . Blood in stool   . Coronary artery disease 1997   s/p PCI by Dr. Glade Lloyd  . Diverticulosis of colon with hemorrhage 2009  . Dyslipidemia, goal LDL below 70   . Edema   . Encounter for long-term (current) use of other medications   . Essential hypertension, benign   . Family history of thyroid disease   . Frequent unifocal PVCs   . Morbid obesity ([place])   . Myalgia and myositis, unspecified   . Prostate cancer ([place]) 2012  . Pulmonary HTN ([place]) 08/14/2018   PASP 30mmHg by echo 2017  . Sleep apnea with use of continuous positive airway pressure (CPAP)   . Thrombocytopenia ([place]) 05/25/2014  . Type II or unspecified type diabetes mellitus without mention of complication, not stated as uncontrolled     Past Surgical History:  Procedure Laterality Date  . ANGIOPLASTY  1997   Dr. Glade Lloyd  . APPENDECTOMY      There were no vitals filed for this visit.  Subjective Assessment - 05/06/19 0812    Subjective   "I don't have any problems with anything to tell you the truth."  Pt reports that he desires to return to work    Pertinent History  Guillain Barre Syndrome (atypical presentation) s/p IVIG  with improvement.  PMH:  cervical stenosis,DM, CAD, HTN, obesity, Hyperlipidemia, insomnia, bilateral CTS, lumbar radiculopathy, degenerative arthritis of bilateral knee, hand arthritis, thrombocytopenia, sleep apnea    Patient Stated Goals  return to work    Currently in Pain?  No/denies        [facility] OT Assessment - 05/06/19 0001      Assessment   Medical Diagnosis  Guilain barre syndrome    Referring Provider (OT)  Dr. Eulogio Bear;  Dr. 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    Leisure  enjoys watching old movies, likes to "tinker" with things      ADL   ADL comments  Pt reports BADLs mod I and has returned to household activities      IADL   Shopping  Takes care of all shopping needs independently    [place] alone or with occasional assistance    Meal Prep  Plans, prepares and serves adequate meals independently    Investment banker, corporate own vehicle    Medication Management  Is responsible for taking medication in correct dosages at correct time      Mobility   Mobility Status Comments  mod I, uses 4-wheeled RW in the community or uses cart, walks short distances without device--see PT eval for details   this is prior level per pt report     Written Expression   Dominant Hand  Right    Handwriting  --   denies difficulty     Vision - History   Baseline Vision  Wears glasses all the  time   cataracts   Additional Comments  denies visual changes       Sensation   Additional Comments  pt denies changes--reports numbness/tingling improved       Coordination   9 Hole Peg Test  Right;Left    Right 9 Hole Peg Test  28.16    Left 9 Hole Peg Test  30.32      ROM / Strength   AROM / PROM / Strength  AROM      AROM   Overall AROM   Within functional limits for tasks performed    Overall AROM Comments  BUEs      Hand Function   Right Hand Grip (lbs)  26.4lbs    Right Hand Lateral Pinch  10 lbs    Right Hand 3 Point Pinch  9 lbs    Left Hand Grip (lbs)  45.6lbs    Left Hand Lateral Pinch  11.5 lbs    Left 3 point pinch  13 lbs                      OT Education - 05/06/19 0908    Education Details  Green putty HEP for R hand (but can perform bilateral), instructed to gradually incr from 1-3 times per day as able (as long as arthritis/thumb pain does not incr) to decr risk of arthritis pain in hand/thumb.  Discussed OT eval results/POC.  Recommended pt follow up with PCP regarding return to work/[employer] paperwork.    Person(s) Educated  Patient    Methods  Explanation;Demonstration;Handout;Verbal cues    Comprehension  Verbalized understanding;Returned demonstration                 Plan - 05/06/19 0914    Clinical Impression Statement  Pt is a 64 y.o. male referred to occupational therapy with diagnosis of Guillain Barre Syndrome (atypical presentation) s/p IVIG in hospital (04/16/19-04/23/19) with improvements.  Pt with PMH that includes:  cervical stenosis, DM, CAD, HTN, obesity, Hyperlipidemia, insomnia, bilateral CTS, lumbar radiculopathy, degenerative arthritis of bilateral knee, hand arthritis, thrombocytopenia, sleep apnea.  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  Although he has not yet been cleared to return to work, pt reports that he has returned to all  previous ADLs and IADLs (including driving, shopping, cleaning, cooking) mod I.  Pt desires to return to work and plans to discuss with PCP.  Pt presents today with decr R hand strength.  Pt was instructed in HEP for R hand strength and how to slowly incr frequency to decr risk of pain from pre-existing arthritis.  Pt verbalized understanding and returned demo HEP.  No further occupational therapy needed at this time.    OT Occupational Profile and History  Detailed Assessment- Review of Records and additional review of physical, cognitive, psychosocial history related to current functional performance    Occupational performance deficits (Please refer to evaluation for details):  IADL's;Work    Body Structure / Function / Physical Skills  Strength    Rehab Potential  Excellent    Clinical Decision Making  Limited treatment options, no task modification necessary    Comorbidities Affecting Occupational Performance:  May have comorbidities impacting occupational performance    Modification or Assistance to Complete Evaluation   No modification of tasks or assist necessary to complete eval    OT Frequency  --   eval only   OT Treatment/Interventions  Self-care/ADL training;Therapeutic exercise;Patient/family education    Plan  Pt instructed in HEP to address R hand weakness (pt reports that he has not observed functional difficulties with R hand).  No further occupational therapy needed at this time.    Consulted and Agree with Plan of Care  Patient       Patient will benefit from skilled therapeutic intervention in order to improve the following deficits and impairments:   Body Structure / Function / Physical Skills: Strength       Visit Diagnosis: Muscle weakness (generalized)    Problem List Patient Active Problem List   Diagnosis Date Noted  . Guillain Barr syndrome ([place]) 04/19/2019  . Upper extremity weakness 04/18/2019  . Central cervical cord injury, without spinal bony injury,  C1-4 ([place]) 04/16/2019  . 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 Degenerative arthritis of knee, bilateral 09/02/2018  . Pulmonary HTN ([place]) 08/14/2018  . Insomnia 07/30/2016  . Carpal tunnel syndrome, bilateral 07/30/2016  . Lumbar radiculopathy 07/30/2016  . Physical exam 06/13/2016  . Obstructive sleep apnea 06/21/2014  . Personal history of malignant neoplasm of prostate 06/21/2014  . Hyperlipidemia associated with type 2 diabetes mellitus ([place]) 04/27/2014  . Morbid obesity ([place]) 06/02/2013  . Frequent unifocal PVCs   . Coronary artery disease   . Hypertension   . Diabetes ([place]) 10/27/2012    [facility] 05/06/2019, 9:24 AM  [facility] [address] [place], [place], [address] Phone: [phone]   Fax:  [phone]  Name: JULION RIGOLI MRN: [id number] Date of Birth: [birth year]   Vianne Bulls, OTR/L [facility] [address] [place],   [address] [phone] phone [phone] 05/06/19 9:24 AM

## 2019-05-06 NOTE — Patient Instructions (Signed)
   Grip Strengthening (Resistive Putty)   Squeeze putty using thumb and all fingers. Repeat 15 times. Do 2-3 sessions per day.     Lateral Pinch Strengthening (Resistive Putty)    Squeeze between thumb and side of each finger in turn. Repeat 5 times. Do 2-3 sessions per day.     Extension (Assistive Putty)   Roll putty back and forth, being sure to use all fingertips. Repeat 2 times. Do 2-3 sessions per day.  Then pinch as below.   Palmar Pinch Strengthening (Resistive Putty)   Pinch putty between thumb and each fingertip in turn after rolling out    FINGERS: Extension (Putty)    Open hand and fingers to flatten putty. 5 reps per set, 2-3 sets per day  Thumb Extensors    Straighten right thumb inside putty loop anchored by fingers. Repeat 5 times. Do 2-3 sessions per day. Activity: Shoot marbles using thumb.*

## 2019-05-06 NOTE — Telephone Encounter (Signed)
Please advise? I thought letters for work were coming from specialists offices?

## 2019-05-12 ENCOUNTER — Encounter: Payer: 59 | Admitting: Occupational Therapy

## 2019-05-14 ENCOUNTER — Encounter: Payer: Self-pay | Admitting: Plastic Surgery

## 2019-05-14 ENCOUNTER — Other Ambulatory Visit: Payer: Self-pay

## 2019-05-14 ENCOUNTER — Ambulatory Visit (INDEPENDENT_AMBULATORY_CARE_PROVIDER_SITE_OTHER): Payer: 59 | Admitting: Plastic Surgery

## 2019-05-14 VITALS — BP 120/76 | HR 81 | Temp 98.0°F | Ht 69.0 in | Wt 326.0 lb

## 2019-05-14 DIAGNOSIS — L989 Disorder of the skin and subcutaneous tissue, unspecified: Secondary | ICD-10-CM

## 2019-05-14 NOTE — Progress Notes (Signed)
Referring Provider [name], MD [address] [place],  [place] [address]   CC:  Chief Complaint  Patient presents with  . Advice Only    for laceration of (R) ear      Travis [name] is an 64 y.o. male.  HPI: Patient presents with a nonhealing wound of his right ear.  Been present for over a year.  It will scab over and heal and then scab over again.  There has been occasional drainage.  Does not particularly hurt.  He had a relative with a similar thing that turned out to be skin cancer.  He was sent by his primary care doctor to see if anything needed to be done.  Allergies  Allergen Reactions  . Crestor [Rosuvastatin]     Muscle Pain   . Lipitor [Atorvastatin] Other (See Comments)    Arm weakness     Outpatient Encounter Medications as of 05/14/2019  Medication Sig  . acetaminophen (TYLENOL) 500 MG tablet Take 1,000 mg by mouth every 6 (six) hours as needed for moderate pain.  [name] albuterol (VENTOLIN HFA) 108 (90 Base) MCG/ACT inhaler Inhale 2 puffs into the lungs every 6 (six) hours as needed for wheezing or shortness of breath.  [name] amLODipine (NORVASC) 10 MG tablet TAKE 1 TABLET BY MOUTH DAILY.  [name] aspirin EC 81 MG tablet Take 81 mg by mouth daily.  . Cholecalciferol (VITAMIN D3 PO) Take 1 tablet by mouth daily.  . furosemide (LASIX) 20 MG tablet Take 1-2 tablets (20-40 mg total) by mouth daily as needed for edema.  . gabapentin (NEURONTIN) 300 MG capsule TAKE 1 CAPSULE BY MOUTH 3 TIMES DAILY  . Iron-Vitamins (GERITOL PO) Take by mouth.  [name] JANUVIA 100 MG tablet TAKE 1 TABLET BY MOUTH DAILY.  [name] lisinopril (ZESTRIL) 40 MG tablet TAKE 1 TABLET BY MOUTH DAILY.  . magnesium gluconate (MAGONATE) 500 MG tablet Take 500 mg by mouth daily.   . metFORMIN (GLUCOPHAGE) 500 MG tablet TAKE 2 TABLETS BY MOUTH 2 TIMES DAILY WITH A MEAL. (Patient taking differently: Take 1,000 mg by mouth 2 (two) times daily with a meal. )  . metoprolol succinate (TOPROL-XL) 100 MG 24 hr tablet  Take 1 tablet (100 mg total) by mouth daily.  . Misc Natural Products (TART CHERRY ADVANCED PO) Take 1 tablet by mouth daily.  . Potassium 95 MG TABS Take 95 mg by mouth daily.  . rosuvastatin (CRESTOR) 5 MG tablet Take 5 mg by mouth daily.  . traZODone (DESYREL) 50 MG tablet TAKE 1/2 TO 1 TABLET BY MOUTH AT BEDTIME AS NEEDED FOR SLEEP  . TRUE METRIX BLOOD GLUCOSE TEST test strip   . TRUEPLUS LANCETS 30G MISC   . Turmeric Curcumin 500 MG CAPS Take by mouth.  . zinc gluconate 50 MG tablet Take 50 mg by mouth daily.   No facility-administered encounter medications on file as of 05/14/2019.     Past Medical History:  Diagnosis Date  . Appendicitis   . Blood in stool   . Coronary artery disease 1997   s/p PCI by Dr. Glade Lloyd  . Diverticulosis of colon with hemorrhage 2009  . Dyslipidemia, goal LDL below 70   . Edema   . Encounter for long-term (current) use of other medications   . Essential hypertension, benign   . Family history of thyroid disease   . Frequent unifocal PVCs   . Morbid obesity ([place])   . Myalgia and myositis, unspecified   .  Prostate cancer ([place]) 2012  . Pulmonary HTN ([place]) 08/14/2018   PASP 32mmHg by echo 2017  . Sleep apnea with use of continuous positive airway pressure (CPAP)   . Thrombocytopenia ([place]) 05/25/2014  . Type II or unspecified type diabetes mellitus without mention of complication, not stated as uncontrolled     Past Surgical History:  Procedure Laterality Date  . ANGIOPLASTY  1997   Dr. Glade Lloyd  . APPENDECTOMY      Family History  Problem Relation Age of Onset  . CVA Father   . Hypertension Father   . Diabetes Mother        DM  . Hypothyroidism Sister   . Hypertension Sister     Social History   Social History Narrative  . Not on file     Review of Systems General: Denies fevers, chills, weight loss CV: Denies chest pain, shortness of breath, palpitations  Physical Exam Vitals with BMI 05/14/2019 04/27/2019 04/23/2019  Height 5'  9" 5\' 11"  -  Weight 326 lbs 326 lbs 6 oz -  BMI [id number] 0000000 -  Systolic [id number]  Diastolic 76 80 74  Pulse 81 81 86    General:  No acute distress,  Alert and oriented, Non-Toxic, Normal speech and affect Right ear has a 1 x 0.5 cm ulcerative lesion in the posterior helical rim.  There is a small erythematous area superior to this.  Assessment/Plan Patient presents with ulcerative lesion of the right ear that chronically heals and then reforms.  We will plan to biopsy this to rule out skin cancer.  We discussed the risk that include bleeding, infection, demonstrating structures, need for additional procedures.  We discussed the potential need for excision and reconstruction thereafter.  He is fully understanding and will proceed with biopsy.  Cindra Presume 05/14/2019, 12:41 PM

## 2019-05-18 ENCOUNTER — Other Ambulatory Visit: Payer: Self-pay

## 2019-05-18 ENCOUNTER — Ambulatory Visit: Payer: Self-pay

## 2019-05-18 ENCOUNTER — Encounter: Payer: Self-pay | Admitting: Family Medicine

## 2019-05-18 ENCOUNTER — Ambulatory Visit (INDEPENDENT_AMBULATORY_CARE_PROVIDER_SITE_OTHER): Payer: 59 | Admitting: Family Medicine

## 2019-05-18 VITALS — BP 122/78 | HR 71 | Ht 69.0 in | Wt 331.4 lb

## 2019-05-18 DIAGNOSIS — G8929 Other chronic pain: Secondary | ICD-10-CM

## 2019-05-18 DIAGNOSIS — M25562 Pain in left knee: Secondary | ICD-10-CM | POA: Diagnosis not present

## 2019-05-18 DIAGNOSIS — M25561 Pain in right knee: Secondary | ICD-10-CM | POA: Diagnosis not present

## 2019-05-18 NOTE — Progress Notes (Signed)
I, Wendy Poet, LAT, ATC, am serving as scribe for Dr. Lynne Leader.  Travis [name] is a 64 y.o. male who presents to [facility] at [facility] today for f/u of B knee pain.  He was last seen by Dr. Georgina Snell on 04/06/19 and had B knee injections.  Since then, pt reports that his B knee pain has flared up again.since last weekend.  He con't to use his rolling walker due to his knee pain.  Diagnostic imaging: B knee XR- 06/10/18   Pertinent review of systems: No fevers or chills  Relevant historical information: Morbid obesity.  DJD.  Last knee injection was about 6 weeks ago.   Exam:  BP 122/78 (BP Location: Left Arm, Patient Position: Sitting, Cuff Size: Large)   Pulse 71   Ht 5\' 9"  (1.753 m)   Wt (!) 331 lb 6.4 oz (150.3 kg)   SpO2 96%   BMI 48.94 kg/m  General: Well Developed, well nourished, and in no acute distress.   MSK: Knees bilaterally have mild effusion.  No erythema.  Diffusely tender decreased range of motion with crepitation.    Lab and Radiology Results  Procedure: Real-time Ultrasound Guided Injection of right knee Device: Philips Affiniti 50G Images permanently stored and available for review in the ultrasound unit. Verbal informed consent obtained.  Discussed risks and benefits of procedure. Warned about infection bleeding damage to structures skin hypopigmentation and fat atrophy among others. Patient expresses understanding and agreement Time-out conducted.   Noted no overlying erythema, induration, or other signs of local infection.   Skin prepped in a sterile fashion.   Local anesthesia: Topical Ethyl chloride.   With sterile technique and under real time ultrasound guidance:  40 mg of Kenalog and 3 mL of Marcaine injected easily.   Completed without difficulty   Pain immediately resolved suggesting accurate placement of the medication.   Advised to call if fevers/chills, erythema, induration, drainage, or persistent bleeding.   Images  permanently stored and available for review in the ultrasound unit.  Impression: Technically successful ultrasound guided injection.     Procedure: Real-time Ultrasound Guided Injection of left knee Device: Philips Affiniti 50G Images permanently stored and available for review in the ultrasound unit. Verbal informed consent obtained.  Discussed risks and benefits of procedure. Warned about infection bleeding damage to structures skin hypopigmentation and fat atrophy among others. Patient expresses understanding and agreement Time-out conducted.   Noted no overlying erythema, induration, or other signs of local infection.   Skin prepped in a sterile fashion.   Local anesthesia: Topical Ethyl chloride.   With sterile technique and under real time ultrasound guidance:  40 mg of Kenalog and 3 mL of Marcaine injected easily.   Completed without difficulty   Pain immediately resolved suggesting accurate placement of the medication.   Advised to call if fevers/chills, erythema, induration, drainage, or persistent bleeding.   Images permanently stored and available for review in the ultrasound unit.  Impression: Technically successful ultrasound guided injection.       Assessment and Plan: 64 y.o. male with bilateral knee pain due to DJD.  Plan for repeat steroid injections.  Injections are becoming more frequent.  He was sick recently as possible that he had a bit of a setback.  I am hopeful that he will be able to have reasonable amount of time before the next steroid injection.  If the stop working we did spend some time talking about next steps.  He is  already had trials of hyaluronic acid injections which were not very helpful.  Ultimately he would likely benefit from a total knee replacement.  However his BMI is too high at 48.9 today to be safe for total knee replacement.  Generally orthopedic surgery is looking for BMI less than 40 often less than 35.  Advised starting weight loss plan  now.  This would also help his knees.  Additionally we discussed geniculate nerve ablation is a possibility in the future.  This is less than ideal but could be done.  Check back with myself or Dr. Tamala Julian.   PDMP not reviewed this encounter. Orders Placed This Encounter  Procedures  . [place] LIMITED JOINT SPACE STRUCTURES LOW BILAT(NO LINKED CHARGES)    Order Specific Question:   Reason for Exam (SYMPTOM  OR DIAGNOSIS REQUIRED)    Answer:   B knee pain    Order Specific Question:   Preferred imaging location?    Answer:   [facility]   No orders of the defined types were placed in this encounter.    Discussed warning signs or symptoms. Please see discharge instructions. Patient expresses understanding.   The above documentation has been reviewed and is accurate and complete Lynne Leader

## 2019-05-18 NOTE — Patient Instructions (Addendum)
You had B knee injections today. Call or go to the ER if you develop a large red swollen joint with extreme pain or oozing puss.   Recheck with Dr Tamala Julian or myself in the future if needed.  If these shots stop working we can arrange for Geniculate Nerve Ablation (Cool Leaf procedure).   Working on weight loss will help.

## 2019-05-19 DIAGNOSIS — Z0279 Encounter for issue of other medical certificate: Secondary | ICD-10-CM

## 2019-05-19 NOTE — Telephone Encounter (Signed)
FMLA Forms have been faxed to [employer] and sent to scan

## 2019-05-21 ENCOUNTER — Other Ambulatory Visit: Payer: Self-pay

## 2019-05-21 ENCOUNTER — Ambulatory Visit (INDEPENDENT_AMBULATORY_CARE_PROVIDER_SITE_OTHER): Payer: 59 | Admitting: Plastic Surgery

## 2019-05-21 ENCOUNTER — Encounter: Payer: Self-pay | Admitting: Plastic Surgery

## 2019-05-21 ENCOUNTER — Other Ambulatory Visit (HOSPITAL_COMMUNITY)
Admission: RE | Admit: 2019-05-21 | Discharge: 2019-05-21 | Disposition: A | Discharge status: Home / Self Care | Payer: 59 | Source: Ambulatory Visit | Attending: Plastic Surgery | Admitting: Plastic Surgery

## 2019-05-21 VITALS — BP 142/76 | HR 81 | Temp 98.8°F | Ht 69.0 in | Wt 330.0 lb

## 2019-05-21 DIAGNOSIS — L989 Disorder of the skin and subcutaneous tissue, unspecified: Secondary | ICD-10-CM | POA: Diagnosis not present

## 2019-05-21 DIAGNOSIS — C44212 Basal cell carcinoma of skin of right ear and external auricular canal: Secondary | ICD-10-CM | POA: Diagnosis not present

## 2019-05-21 NOTE — Addendum Note (Signed)
Addended by: Cindra Presume on: 05/21/2019 05:14 PM   Modules accepted: Orders

## 2019-05-21 NOTE — Progress Notes (Signed)
Operative Note   DATE OF OPERATION: 05/21/2019  LOCATION:    SURGICAL DEPARTMENT: Plastic Surgery  PREOPERATIVE DIAGNOSES: Suspicious lesion right helical rim  POSTOPERATIVE DIAGNOSES:  same  PROCEDURE:  1. Punch biopsy right helical rim x2  SURGEON: Talmadge Coventry, MD  ANESTHESIA:  Local  COMPLICATIONS: None.   INDICATIONS FOR PROCEDURE:  The patient, Travis [name] is a 64 y.o. male born on Jan 04, 1956, is here for treatment of suspicious right ear lesion MRN: [id number]  CONSENT:  Informed consent was obtained directly from the patient. Risks, benefits and alternatives were fully discussed. Specific risks including but not limited to bleeding, infection, hematoma, seroma, scarring, pain, infection, wound healing problems, and need for further surgery were all discussed. The patient did have an ample opportunity to have questions answered to satisfaction.   DESCRIPTION OF PROCEDURE:  Local anesthesia was administered. The patient's operative site was prepped and draped in a sterile fashion. A time out was performed and all information was confirmed to be correct.  2 3 mm punch biopsies were taken from the superior and inferior aspect of the lesion.  These were done at the margin.  A single interrupted 5-0 Monocryl suture was placed at each location.  The patient tolerated the procedure well.  There were no complications.

## 2019-05-25 LAB — SURGICAL PATHOLOGY

## 2019-06-01 ENCOUNTER — Other Ambulatory Visit: Payer: Self-pay

## 2019-06-01 ENCOUNTER — Ambulatory Visit (INDEPENDENT_AMBULATORY_CARE_PROVIDER_SITE_OTHER): Payer: 59 | Admitting: Family Medicine

## 2019-06-01 ENCOUNTER — Encounter: Payer: Self-pay | Admitting: Family Medicine

## 2019-06-01 DIAGNOSIS — M170 Bilateral primary osteoarthritis of knee: Secondary | ICD-10-CM

## 2019-06-01 MED ORDER — KETOROLAC TROMETHAMINE 30 MG/ML IJ SOLN: 30.0000 mg | Freq: Once | INTRAMUSCULAR | Status: DC

## 2019-06-01 MED ORDER — KETOROLAC TROMETHAMINE 60 MG/2ML IM SOLN: 60.0000 mg | Freq: Once | INTRAMUSCULAR | Status: AC

## 2019-06-01 NOTE — Patient Instructions (Signed)
See me in 6 weeks

## 2019-06-01 NOTE — Progress Notes (Signed)
Travis [name] Phone: [phone] Subjective:   [name], am serving as a scribe for Dr. Hulan Saas. This visit occurred during the SARS-CoV-2 public health emergency.  Safety protocols were in place, including screening questions prior to the visit, additional usage of staff PPE, and extensive cleaning of exam room while observing appropriate contact time as indicated for disinfecting solutions.   I'm seeing this patient by the request  of:  [name], MD  CC: Bilateral knee pain follow-up  [id number]  Travis [name] is a 64 y.o. male coming in with complaint of bilateral knee pain. Patient states that his pain decreased post injection. Is having tenderness in the left knee. Notices a hyperextending of left knee at times. Ambulating with a walker.  Patient states that the injection given by the other provider only lasted approximately 1 week and now is having worsening pain again.  Patient states that it is affecting daily activities to a certain degree.    Past Medical History:  Diagnosis Date  . Appendicitis   . Blood in stool   . Coronary artery disease 1997   s/p PCI by Dr. Glade Lloyd  . Diverticulosis of colon with hemorrhage 2009  . Dyslipidemia, goal LDL below 70   . Edema   . Encounter for long-term (current) use of other medications   . Essential hypertension, benign   . Family history of thyroid disease   . Frequent unifocal PVCs   . Morbid obesity ([place])   . Myalgia and myositis, unspecified   . Prostate cancer ([place]) 2012  . Pulmonary HTN ([place]) 08/14/2018   PASP 32mmHg by echo 2017  . Sleep apnea with use of continuous positive airway pressure (CPAP)   . Thrombocytopenia ([place]) 05/25/2014  . Type II or unspecified type diabetes mellitus without mention of complication, not stated as uncontrolled    Past Surgical History:  Procedure Laterality Date  . ANGIOPLASTY  1997   Dr.  Glade Lloyd  . APPENDECTOMY     Social History   Socioeconomic History  . Marital status: Married    Spouse name: Not on file  . Number of children: Not on file  . Years of education: Not on file  . Highest education level: Not on file  Occupational History  . Occupation: Scientific laboratory technician: [employer]    Comment: mod/heavy lifting  Tobacco Use  . Smoking status: Never Smoker  . Smokeless tobacco: Never Used  Substance and Sexual Activity  . Alcohol use: No    Alcohol/week: 0.0 standard drinks  . Drug use: No  . Sexual activity: Not on file  Other Topics Concern  . Not on file  Social History Narrative  . Not on file   Social Determinants of Health   Financial Resource Strain:   . Difficulty of Paying Living Expenses:   Food Insecurity:   . Worried About Charity fundraiser in the Last Year:   . Arboriculturist in the Last Year:   Transportation Needs:   . Film/video editor (Medical):   [name] Lack of Transportation (Non-Medical):   Physical Activity:   . Days of Exercise per Week:   . Minutes of Exercise per Session:   Stress:   . Feeling of Stress :   Social Connections:   . Frequency of Communication with Friends and Family:   . Frequency of Social Gatherings with Friends and Family:   .  Attends Religious Services:   . Active Member of Clubs or Organizations:   . Attends Archivist Meetings:   [name] Marital Status:    Allergies  Allergen Reactions  . Crestor [Rosuvastatin]     Muscle Pain   . Lipitor [Atorvastatin] Other (See Comments)    Arm weakness    Family History  Problem Relation Age of Onset  . CVA Father   . Hypertension Father   . Diabetes Mother        DM  . Hypothyroidism Sister   . Hypertension Sister     Current Outpatient Medications (Endocrine & Metabolic):  [name]  JANUVIA 100 MG tablet, TAKE 1 TABLET BY MOUTH DAILY. .  metFORMIN (GLUCOPHAGE) 500 MG tablet, TAKE 2 TABLETS BY MOUTH 2 TIMES DAILY WITH A MEAL. (Patient taking  differently: Take 1,000 mg by mouth 2 (two) times daily with a meal. )   Current Outpatient Medications (Cardiovascular):  .  amLODipine (NORVASC) 10 MG tablet, TAKE 1 TABLET BY MOUTH DAILY. .  furosemide (LASIX) 20 MG tablet, Take 1-2 tablets (20-40 mg total) by mouth daily as needed for edema. [name]  lisinopril (ZESTRIL) 40 MG tablet, TAKE 1 TABLET BY MOUTH DAILY. .  metoprolol succinate (TOPROL-XL) 100 MG 24 hr tablet, Take 1 tablet (100 mg total) by mouth daily. .  rosuvastatin (CRESTOR) 5 MG tablet, Take 5 mg by mouth daily.   Current Outpatient Medications (Respiratory):  .  albuterol (VENTOLIN HFA) 108 (90 Base) MCG/ACT inhaler, Inhale 2 puffs into the lungs every 6 (six) hours as needed for wheezing or shortness of breath.   Current Outpatient Medications (Analgesics):  .  acetaminophen (TYLENOL) 500 MG tablet, Take 1,000 mg by mouth every 6 (six) hours as needed for moderate pain. [name]  aspirin EC 81 MG tablet, Take 81 mg by mouth daily.  Current Facility-Administered Medications (Analgesics):  .  ketorolac (TORADOL) injection 60 mg    Current Outpatient Medications (Other):  [name]  Cholecalciferol (VITAMIN D3 PO), Take 1 tablet by mouth daily. [name]  gabapentin (NEURONTIN) 300 MG capsule, TAKE 1 CAPSULE BY MOUTH 3 TIMES DAILY .  Iron-Vitamins (GERITOL PO), Take by mouth. .  magnesium gluconate (MAGONATE) 500 MG tablet, Take 500 mg by mouth daily.  .  Misc Natural Products (TART CHERRY ADVANCED PO), Take 1 tablet by mouth daily. .  Potassium 95 MG TABS, Take 95 mg by mouth daily. .  traZODone (DESYREL) 50 MG tablet, TAKE 1/2 TO 1 TABLET BY MOUTH AT BEDTIME AS NEEDED FOR SLEEP .  TRUE METRIX BLOOD GLUCOSE TEST test strip,  .  TRUEPLUS LANCETS 30G MISC,  .  Turmeric Curcumin 500 MG CAPS, Take by mouth. .  zinc gluconate 50 MG tablet, Take 50 mg by mouth daily.    Reviewed prior external information including notes and imaging from  primary care provider As well as notes that were  available from care everywhere and other healthcare systems.  Past medical history, social, surgical and family history all reviewed in electronic medical record.  No pertanent information unless stated regarding to the chief complaint.   Review of Systems:  No headache, visual changes, nausea, vomiting, diarrhea, constipation, dizziness, abdominal pain, skin rash, fevers, chills, night sweats, weight loss, swollen lymph nodes, , chest pain, shortness of breath, mood changes. POSITIVE muscle aches, body aches, joint swelling  Objective  Blood pressure 132/84, pulse 80, height 5\' 9"  (1.753 m), weight (!) 330 lb (149.7 kg), SpO2 98 %.   General: No  apparent distress alert and oriented x3 mood and affect normal, dressed appropriately.  Morbidly obese HEENT: Pupils equal, extraocular movements intact  Respiratory: Patient's speak in full sentences and does not appear short of breath  Cardiovascular: 2+ lower extremity edema, non tender, no erythema  Neuro: Cranial nerves II through XII are intact, Gait walking with the aid of a walker MSK:  Knee:  Bilateral  valgus deformity noted. Large thigh to calf ratio.  Tender to palpation over medial and PF joint line.  ROM full in flexion and extension and lower leg rotation. instability with valgus force.  painful patellar compression. Patellar glide with moderate crepitus. Patellar and quadriceps tendons unremarkable. Hamstring and quadriceps strength is normal. \  After informed written and verbal consent, patient was seated on exam table. Right knee was prepped with alcohol swab and utilizing anterolateral approach, patient's right knee space was injected with 4:1  marcaine 0.5%: Toradol 30 mg/mL . Patient tolerated the procedure well without immediate complications.  After informed written and verbal consent, patient was seated on exam table. Left knee was prepped with alcohol swab and utilizing anterolateral approach, patient's left knee space  was injected with 4:1  marcaine 0.5%: Toradol 30 mg/mL. Patient tolerated the procedure well without immediate complications.    Impression and Recommendations:     This case required medical decision making of moderate complexity. The above documentation has been reviewed and is accurate and complete Lyndal Pulley, DO       Note: This dictation was prepared with Dragon dictation along with smaller phrase technology. Any transcriptional errors that result from this process are unintentional.

## 2019-06-01 NOTE — Assessment & Plan Note (Signed)
Chronic problem with exacerbation.  Severe knee arthritis noted.  Failed all conservative therapy.  Secondary to patient's age is not a surgical candidate.  Continue the gabapentin.  Continue topical anti-inflammatories, patient given Toradol injections x2 in the knees today.  Hopefully this will be beneficial.  Follow-up again in 6 to 8 weeks for further evaluation and treatment at that point can restart steroid injections.

## 2019-06-02 ENCOUNTER — Ambulatory Visit: Payer: 59 | Admitting: Neurology

## 2019-06-02 ENCOUNTER — Other Ambulatory Visit: Payer: Self-pay

## 2019-06-02 ENCOUNTER — Encounter: Payer: Self-pay | Admitting: Neurology

## 2019-06-02 VITALS — BP 129/76 | HR 69 | Ht 69.0 in | Wt 326.5 lb

## 2019-06-02 DIAGNOSIS — R29898 Other symptoms and signs involving the musculoskeletal system: Secondary | ICD-10-CM | POA: Diagnosis not present

## 2019-06-02 DIAGNOSIS — R269 Unspecified abnormalities of gait and mobility: Secondary | ICD-10-CM | POA: Diagnosis not present

## 2019-06-02 DIAGNOSIS — R937 Abnormal findings on diagnostic imaging of other parts of musculoskeletal system: Secondary | ICD-10-CM | POA: Insufficient documentation

## 2019-06-02 NOTE — Progress Notes (Signed)
PATIENT: Travis [name] DOB: October 15, 1955  Chief Complaint  Patient presents with  . Extremity Weakness    He is here for follow up from hospitalization for GBS. States his whole body is weak but his upper extremities are worse. He uses a rolling walker to assist with ambulation. He had five days of IVIG while in hospital which helped.   [name] PCP    [name], MD (referred from hospital)     HISTORICAL  IYAAD [name] is a 64 year old male, seen in request by his primary care physician [name] for evaluation of upper extremity weakness, initial evaluation was on Jun 02, 2019.  I have reviewed and summarized the referring note from the referring physician.  He has past medical history of hypertension, hyperlipidemia, diabetes, coronary artery disease, status post PCI in 1997, obstructive sleep apnea, using CPAP machine, obesity, body mass index was 48  He was admitted to the hospital from April 1 through 08/2019, while working, he suddenly noticed bilateral hands weakness, he could not grasp with his fingers, also difficult to extend his fingers, some weakness in his arms, there was no lower extremity weakness noted, he was seen by neurosurgeon Dr. Ellene Route, later neuro hospitalist Dr. Cheral Marker, reported prior to his onset of bilateral hands weakness, he had a short spell of bilateral feet paresthesia, that resolved spontaneously  I personally reviewed MRI of cervical spine April 16, 2019, disc protrusion at T1-T2, lateral recess foraminal stenosis at C4-5, potential impingement of right C5 nerve roots, bilateral foraminal stenosis C6-7, focal subtle myelomalacia at the spinal cord centrally and left of C5-6, there was consideration that hyperintensity in the cord appears to be artifact.  MRI of the brain was normal  CT angiogram of head and neck showed no large vessel disease  He was initially evaluated by neurosurgeon Dr. Kristeen Miss, patient was noted to have weakness in  finger extension, wrist extension, grip strength, bilateral hand intrinsic muscles, and bilateral triceps, but biceps and deltoid strength were intact, There was no sensory deficit per neurologist note, reflexes are normal at bilateral lower extremity, mildly diminished at bilateral upper extremity.  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  Laboratory evaluations, A1c 6.3, vitamin D 34, CBC showed hemoglobin of 12.6, negative HIV, normal CPK 93, rheumatoid factor, A1c of 6.3  spinal fluid testing: WBC of 1, RBC of 106, protein of 66, glucose of 67  He continued to improve over the past few weeks, but still has mild bilateral upper extremity weakness, does not have altered grip strengths come back yet.  REVIEW OF SYSTEMS: Full 14 system review of systems performed and notable only for  As above. All other review of systems were negative.  ALLERGIES: Allergies  Allergen Reactions  . Crestor [Rosuvastatin]     Muscle Pain   . Lipitor [Atorvastatin] Other (See Comments)    Arm weakness     HOME MEDICATIONS: Current Outpatient Medications  Medication Sig Dispense Refill  . acetaminophen (TYLENOL) 500 MG tablet Take 1,000 mg by mouth every 6 (six) hours as needed for moderate pain.    [name] albuterol (VENTOLIN HFA) 108 (90 Base) MCG/ACT inhaler Inhale 2 puffs  into the lungs every 6 (six) hours as needed for wheezing or shortness of breath. 18 g 3  . amLODipine (NORVASC) 10 MG tablet TAKE 1 TABLET BY MOUTH DAILY. 90 tablet 0  . aspirin EC 81 MG tablet Take 81 mg by mouth daily.    . Cholecalciferol (VITAMIN D3 PO) Take 1 tablet by mouth daily.    . furosemide  (LASIX) 20 MG tablet Take 1-2 tablets (20-40 mg total) by mouth daily as needed for edema.    . gabapentin (NEURONTIN) 300 MG capsule TAKE 1 CAPSULE BY MOUTH 3 TIMES DAILY 90 capsule 0  . Iron-Vitamins (GERITOL PO) Take by mouth.    [name] JANUVIA 100 MG tablet TAKE 1 TABLET BY MOUTH DAILY. 90 tablet 1  . lisinopril (ZESTRIL) 40 MG tablet TAKE 1 TABLET BY MOUTH DAILY. 90 tablet 1  . magnesium gluconate (MAGONATE) 500 MG tablet Take 500 mg by mouth daily.     . metFORMIN (GLUCOPHAGE) 500 MG tablet TAKE 2 TABLETS BY MOUTH 2 TIMES DAILY WITH A MEAL. (Patient taking differently: Take 1,000 mg by mouth 2 (two) times daily with a meal. ) 360 tablet 1  . metoprolol succinate (TOPROL-XL) 100 MG 24 hr tablet Take 1 tablet (100 mg total) by mouth daily. 90 tablet 1  . Misc Natural Products (TART CHERRY ADVANCED PO) Take 1 tablet by mouth daily.    . Potassium 95 MG TABS Take 95 mg by mouth daily.    . rosuvastatin (CRESTOR) 5 MG tablet Take 5 mg by mouth daily.    . traZODone (DESYREL) 50 MG tablet TAKE 1/2 TO 1 TABLET BY MOUTH AT BEDTIME AS NEEDED FOR SLEEP 90 tablet 0  . TRUE METRIX BLOOD GLUCOSE TEST test strip   1  . TRUEPLUS LANCETS 30G MISC   1  . Turmeric Curcumin 500 MG CAPS Take by mouth.    . zinc gluconate 50 MG tablet Take 50 mg by mouth daily.     Current Facility-Administered Medications  Medication Dose Route Frequency Provider Last Rate Last Admin  . ketorolac (TORADOL) injection 60 mg  60 mg Intramuscular Once Lyndal Pulley, DO        PAST MEDICAL HISTORY: Past Medical History:  Diagnosis Date  . Appendicitis   . Blood in stool   . Coronary artery disease 1997   s/p PCI by Dr. Glade Lloyd  . Diverticulosis of colon with hemorrhage 2009  . Dyslipidemia, goal LDL below 70   . Edema   . Encounter for long-term (current) use of other medications   . Essential hypertension, benign   . Family history of thyroid disease   . Frequent unifocal PVCs   . 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 Morbid obesity ([place])   . Myalgia and myositis, unspecified   . Prostate cancer ([place]) 2012  . Pulmonary HTN ([place]) 08/14/2018   PASP 33mmHg by echo 2017  . Sleep apnea with use of continuous positive airway pressure (CPAP)   . Thrombocytopenia ([place]) 05/25/2014  . Type II or unspecified type diabetes mellitus without mention of complication, not stated as uncontrolled     PAST SURGICAL HISTORY: Past Surgical History:  Procedure Laterality Date  . ANGIOPLASTY  1997   Dr. Glade Lloyd  . APPENDECTOMY    . radioactive seed prostate      FAMILY HISTORY: Family History  Problem Relation Age of Onset  . CVA Father   . Hypertension Father   . Diabetes Mother   .  Hypothyroidism Sister   . Hypertension Sister     SOCIAL HISTORY: Social History   Socioeconomic History  . Marital status: Married    Spouse name: Not on file  . Number of children: 0  . Years of education: 35  . Highest education level: High school graduate  Occupational History  . 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 Smoking status: Never Smoker  . Smokeless tobacco: Never Used  Substance and Sexual Activity  . Alcohol use: No  . Drug use: No  . Sexual activity: Not on file  Other Topics Concern  . Not on file  Social History Narrative   Lives at home with his wife.   Right-handed.   Caffeine: occasional use.   Social Determinants of Health   Financial Resource Strain:   . Difficulty of Paying Living Expenses:   Food Insecurity:   . Worried About Charity fundraiser in the Last Year:   . Arboriculturist in the Last Year:   Transportation Needs:   . Film/video editor (Medical):   [name] Lack of Transportation (Non-Medical):   Physical Activity:   . Days of Exercise per Week:   . Minutes of Exercise per Session:   Stress:   . Feeling of Stress :   Social Connections:   . Frequency of Communication with Friends and Family:   .  Frequency of Social Gatherings with Friends and Family:   . Attends Religious Services:   . Active Member of Clubs or Organizations:   . Attends Archivist Meetings:   [name] Marital Status:   Intimate Partner Violence:   . Fear of Current or Ex-Partner:   . Emotionally Abused:   [name] Physically Abused:   . Sexually Abused:      PHYSICAL EXAM   Vitals:   06/02/19 0740  BP: 129/76  Pulse: 69  Weight: (!) 326 lb 8 oz (148.1 kg)  Height: 5\' 9"  (1.753 m)    Not recorded      Body mass index is 48.22 kg/m.  PHYSICAL EXAMNIATION:  Gen: NAD, conversant, well nourised, well groomed                     Cardiovascular: Regular rate rhythm, no peripheral edema, warm, nontender. Eyes: Conjunctivae clear without exudates or hemorrhage Neck: Supple, no carotid bruits. Pulmonary: Clear to auscultation bilaterally   NEUROLOGICAL EXAM: Obese, sit in his rolling walker.  MENTAL STATUS: Speech:    Speech is normal; fluent and spontaneous with normal comprehension.  Cognition:     Orientation to time, place and person     Normal recent and remote memory     Normal Attention span and concentration     Normal Language, naming, repeating,spontaneous speech     Fund of knowledge   CRANIAL NERVES: CN II: Visual fields are full to confrontation. Pupils are round equal and briskly reactive to light. CN III, IV, VI: extraocular movement are normal. No ptosis. CN V: Facial sensation is intact to light touch CN VII: Face is symmetric with normal eye closure  CN VIII: Hearing is normal to causal conversation. CN IX, X: Phonation is normal. CN XI: Head turning and shoulder shrug are intact  MOTOR: There was no significant upper and lower extremity proximal and distal muscle weakness noted.  REFLEXES: Reflexes are hypoactive and symmetric at the biceps, triceps, knees, and ankles. Plantar responses  are flexor.  SENSORY: Length dependent decreased light touch, pinprick, vibratory  sensation to mid shin level  COORDINATION: There is no trunk or limb dysmetria noted.  GAIT/STANCE: He needs to push on his wheelchair to get up from seated position, limited by her big body habitus, but steady,  DIAGNOSTIC DATA (LABS, IMAGING, TESTING) - I reviewed patient records, labs, notes, testing and imaging myself where available.   ASSESSMENT AND PLAN  DORION SOMMERFELD is a 64 y.o. male   Sudden onset bilateral hands weakness on April 16, 2019,  CSF showed elevated total protein of 60, is in the setting of obesity, diabetes,  He reported at least moderate improvement with IVIG treatment (from April 4-8 2021), from reviewing neurohospitalist note, he actually began to improve before IVIG treatment,  MRI of cervical spine also showed multilevel degenerative changes, subtle cord signal changes at C5-6, versus artifact,  Not sure exactly etiology of his complaints of sudden onset bilateral hands weakness, atypical for demyelinating disease, differentiation diagnosis including cervical myelopathy, upper extremity neuropathy  Repeat MRI of cervical spine with without contrast  EMG nerve conduction study   Marcial Pacas, M.D. Ph.D.  [facility] [address], [place], [place] [address] Ph: [phone] Fax: [phone]  CC: [name], MD

## 2019-06-03 ENCOUNTER — Other Ambulatory Visit: Payer: Self-pay

## 2019-06-03 ENCOUNTER — Ambulatory Visit (INDEPENDENT_AMBULATORY_CARE_PROVIDER_SITE_OTHER): Payer: 59 | Admitting: Plastic Surgery

## 2019-06-03 ENCOUNTER — Encounter: Payer: Self-pay | Admitting: Plastic Surgery

## 2019-06-03 VITALS — BP 123/83 | HR 74 | Temp 97.8°F | Ht 69.0 in | Wt 330.0 lb

## 2019-06-03 DIAGNOSIS — C44212 Basal cell carcinoma of skin of right ear and external auricular canal: Secondary | ICD-10-CM | POA: Diagnosis not present

## 2019-06-03 NOTE — Progress Notes (Signed)
   Referring Provider [name], MD [address] [place],  [place] [address]   CC:  Chief Complaint  Patient presents with  . Follow-up    1-2 weeks for Take pictures      Travis [name] is an 64 y.o. male.  HPI: Patient is here after punch biopsy of a suspicious lesion in his right helical rim.  This came back basal cell carcinoma.  I am arranging for him to have Mohs excision of this followed by what will likely be a flap reconstruction by me.  Review of Systems General: Denies fevers or chills  Physical Exam Vitals with BMI 06/03/2019 06/02/2019 06/01/2019  Height 5\' 9"  5\' 9"  5\' 9"   Weight 330 lbs 326 lbs 8 oz 330 lbs  BMI 48.71 [id number]  Systolic [id number]  Diastolic 83 76 84  Pulse 74 69 80    General:  No acute distress,  Alert and oriented, Non-Toxic, Normal speech and affect Biopsy site is healed up fine with persistent ulcerative lesion that I took pictures on today.  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  We will plan to arrange Mohs excision followed by flap reconstruction by me.  I would like to do this the same day if possible.  I explained the process to him and our office will assist him in coordinating the timing.  All of his questions were answered.  Cindra Presume 06/03/2019, 1:29 PM

## 2019-06-08 ENCOUNTER — Other Ambulatory Visit: Payer: Self-pay | Admitting: Family Medicine

## 2019-06-08 LAB — MULTIPLE MYELOMA PANEL, SERUM
Albumin SerPl Elph-Mcnc: 3.7 g/dL (ref 2.9–4.4)
Albumin/Glob SerPl: 1.2 (ref 0.7–1.7)
Alpha 1: 0.2 g/dL (ref 0.0–0.4)
Alpha2 Glob SerPl Elph-Mcnc: 0.9 g/dL (ref 0.4–1.0)
B-Globulin SerPl Elph-Mcnc: 1.2 g/dL (ref 0.7–1.3)
Gamma Glob SerPl Elph-Mcnc: 0.8 g/dL (ref 0.4–1.8)
Globulin, Total: 3.1 g/dL (ref 2.2–3.9)
IgA/Immunoglobulin A, Serum: 274 mg/dL (ref 61–437)
IgG (Immunoglobin G), Serum: 808 mg/dL (ref 603–1613)
IgM (Immunoglobulin M), Srm: 99 mg/dL (ref 20–172)
Total Protein: 6.8 g/dL (ref 6.0–8.5)

## 2019-06-08 LAB — CK: Total CK: 55 U/L (ref 41–331)

## 2019-06-08 LAB — COPPER, SERUM: Copper: 113 ug/dL (ref 69–132)

## 2019-06-08 LAB — THYROID PANEL WITH TSH
Free Thyroxine Index: 2.2 (ref 1.2–4.9)
T3 Uptake Ratio: 30 % (ref 24–39)
T4, Total: 7.4 ug/dL (ref 4.5–12.0)
TSH: 0.846 u[IU]/mL (ref 0.450–4.500)

## 2019-06-08 LAB — RPR: RPR Ser Ql: NONREACTIVE

## 2019-06-08 LAB — C-REACTIVE PROTEIN: CRP: 6 mg/L (ref 0–10)

## 2019-06-08 LAB — ANA W/REFLEX IF POSITIVE: Anti Nuclear Antibody (ANA): NEGATIVE

## 2019-06-08 LAB — SEDIMENTATION RATE: Sed Rate: 14 mm/hr (ref 0–30)

## 2019-06-08 LAB — FERRITIN: Ferritin: 70 ng/mL (ref 30–400)

## 2019-06-08 LAB — B. BURGDORFI ANTIBODIES: Lyme IgG/IgM Ab: <0.91 {ISR} (ref 0.00–0.90)

## 2019-06-08 LAB — VITAMIN B12: Vitamin B-12: 257 pg/mL (ref 232–1245)

## 2019-06-08 MED FILL — GABAPENTIN 300 MG CAPSULE: 300 | 30 days supply | Qty: 90 | Fill #0

## 2019-06-08 MED FILL — JANUVIA 100 MG TABLET: 100 | 30 days supply | Qty: 30 | Fill #3

## 2019-06-08 MED FILL — LISINOPRIL 40 MG TABS: 40 | 90 days supply | Qty: 90 | Fill #0

## 2019-06-08 MED FILL — traZODone HCL 50 MG TABS: 50 | 90 days supply | Qty: 90 | Fill #0

## 2019-06-09 ENCOUNTER — Telehealth: Payer: Self-pay | Admitting: Neurology

## 2019-06-09 NOTE — Telephone Encounter (Signed)
[facility]: NPR order sent to GI. They will reach out to the patient to schedule.

## 2019-06-10 ENCOUNTER — Other Ambulatory Visit: Payer: Self-pay

## 2019-06-10 ENCOUNTER — Ambulatory Visit
Admission: RE | Admit: 2019-06-10 | Discharge: 2019-06-10 | Disposition: A | Discharge status: Home / Self Care | Payer: 59 | Source: Ambulatory Visit | Attending: Neurology | Admitting: Neurology

## 2019-06-10 DIAGNOSIS — R29898 Other symptoms and signs involving the musculoskeletal system: Secondary | ICD-10-CM | POA: Diagnosis not present

## 2019-06-10 DIAGNOSIS — R937 Abnormal findings on diagnostic imaging of other parts of musculoskeletal system: Secondary | ICD-10-CM

## 2019-06-10 DIAGNOSIS — R269 Unspecified abnormalities of gait and mobility: Secondary | ICD-10-CM

## 2019-06-10 MED ORDER — GADOBENATE DIMEGLUMINE 529 MG/ML IV SOLN: 20.0000 mL | Freq: Once | INTRAVENOUS | Status: DC | PRN

## 2019-06-10 MED ORDER — GADOBENATE DIMEGLUMINE 529 MG/ML IV SOLN
20.0000 mL | Freq: Once | INTRAVENOUS | Status: AC | PRN
  Administered 2019-06-10: 20 mL via INTRAVENOUS

## 2019-06-11 ENCOUNTER — Telehealth: Payer: Self-pay | Admitting: Family Medicine

## 2019-06-11 NOTE — Telephone Encounter (Signed)
Paperwork given to PCP.  

## 2019-06-11 NOTE — Telephone Encounter (Signed)
Pt's wife dropped off a Medical cert. Form I have placed that in the bin with a charge sheet.

## 2019-06-16 ENCOUNTER — Telehealth: Payer: Self-pay | Admitting: Neurology

## 2019-06-16 NOTE — Telephone Encounter (Signed)
I have spoken to the patient and he verbalized understanding of his MRI results. He will keep his pending appt for NCV/EMG on 06/24/19.

## 2019-06-16 NOTE — Telephone Encounter (Signed)
IMPRESSION:   MRI cervical spine (with and without) demonstrating: - At C4-5: disc bulging and facet hypertrophy with borderline mild spinal stenosis, severe right and moderate left foraminal stenosis. - At C5-6: disc bulging and facet hypertrophy with borderline mild spinal stenosis and severe left foraminal stenosis; subtle myelomalacia noted on axial gradient echo views. - At C6-7: disc bulging and uncovertebral joint hypertrophy with moderate left foraminal stenosis. - Overall stable compared to 04/17/19.  Please call patient, repeat MRI of cervical spine showed no significant change compared to previous study in April, continued evidence of multiple degenerative changes, variable degree of foraminal narrowing, most severe is at C4-5 severe right moderate left side stenosis, C5-6 severe left stenosis with evidence of subtle myelomalacia  There is no evidence of spinal cord compression,

## 2019-06-22 DIAGNOSIS — Z0279 Encounter for issue of other medical certificate: Secondary | ICD-10-CM

## 2019-06-22 NOTE — Telephone Encounter (Signed)
FYI

## 2019-06-22 NOTE — Telephone Encounter (Signed)
Form completed and placed in basket  

## 2019-06-22 NOTE — Telephone Encounter (Signed)
Picked up from back, faxed original, made copy of form for scanning and for billing  - sa

## 2019-06-23 ENCOUNTER — Telehealth: Payer: Self-pay | Admitting: Family Medicine

## 2019-06-23 NOTE — Telephone Encounter (Signed)
Please advise 

## 2019-06-23 NOTE — Telephone Encounter (Signed)
None

## 2019-06-23 NOTE — Telephone Encounter (Signed)
[facility] [employer] department called and said that they will need an end date (estimated on the disability) they can no longer file it with ongoing

## 2019-06-24 ENCOUNTER — Ambulatory Visit (INDEPENDENT_AMBULATORY_CARE_PROVIDER_SITE_OTHER): Payer: 59 | Admitting: Neurology

## 2019-06-24 ENCOUNTER — Other Ambulatory Visit: Payer: Self-pay

## 2019-06-24 ENCOUNTER — Ambulatory Visit: Payer: 59 | Admitting: Neurology

## 2019-06-24 ENCOUNTER — Telehealth: Payer: Self-pay | Admitting: *Deleted

## 2019-06-24 DIAGNOSIS — R29898 Other symptoms and signs involving the musculoskeletal system: Secondary | ICD-10-CM | POA: Diagnosis not present

## 2019-06-24 DIAGNOSIS — R269 Unspecified abnormalities of gait and mobility: Secondary | ICD-10-CM | POA: Diagnosis not present

## 2019-06-24 DIAGNOSIS — G6289 Other specified polyneuropathies: Secondary | ICD-10-CM

## 2019-06-24 DIAGNOSIS — R937 Abnormal findings on diagnostic imaging of other parts of musculoskeletal system: Secondary | ICD-10-CM

## 2019-06-24 DIAGNOSIS — G6281 Critical illness polyneuropathy: Secondary | ICD-10-CM | POA: Diagnosis not present

## 2019-06-24 DIAGNOSIS — G629 Polyneuropathy, unspecified: Secondary | ICD-10-CM | POA: Insufficient documentation

## 2019-06-24 NOTE — Progress Notes (Signed)
PATIENT: Travis [name] DOB: 1955/03/20  No chief complaint on file.    HISTORICAL  Travis [name] is a 64 year old male, seen in request by his primary care physician Travis [name] for evaluation of upper extremity weakness, initial evaluation was on Jun 02, 2019.  He has past medical history of hypertension, hyperlipidemia, diabetes, coronary artery disease, status post PCI in 1997, obstructive sleep apnea, using CPAP machine, obesity, body mass index was 48  He was admitted to the hospital from April 1 through 08/2019, while working, he suddenly noticed bilateral hands weakness, he could not grasp with his fingers, also difficult to extend his fingers, some weakness in his arms, there was no lower extremity weakness noted, he was seen by neurosurgeon Dr. Ellene [name], later neuro hospitalist Dr. Cheral [name], reported prior to his onset of bilateral hands weakness, he had a short spell of bilateral feet paresthesia, that resolved spontaneously  Upon initial evaluation, patient was noted to have weakness in finger extension, wrist extension, grip strength, bilateral hand intrinsic muscles, and bilateral triceps, but biceps and deltoid strength were intact, There was no sensory deficit per neurologist note, reflexes are normal at bilateral lower extremity, mildly diminished at bilateral upper extremity.  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  I personally reviewed MRI of cervical spine April 16, 2019, disc protrusion at T1-T2, lateral recess foraminal stenosis at C4-5, potential impingement of right C5 nerve roots, bilateral foraminal stenosis C6-7, focal subtle myelomalacia at the spinal cord centrally and left of C5-6, there was consideration that hyperintensity in the cord appears to be  artifact.  MRI of the brain was normal  CT angiogram of head and neck showed no large vessel disease  Laboratory evaluations, A1c 6.3, vitamin D 34, CBC showed hemoglobin of 12.6, negative HIV, normal CPK 93, rheumatoid factor, A1c of 6.3  spinal fluid testing: WBC of 1, RBC of 106, protein of 66, glucose of 67  He continued to improve over the past few weeks, but still has mild bilateral upper extremity weakness, does not have altered grip strengths come back yet.  UPDATE June 24 2019: Patient return for electrodiagnostic study today, sensory nerve conduction studies were in most part well-preserved, with normal bilateral sural and right ulnar sensory response, mildly decreased snap amplitude at right superficial peroneal sensory response, left superficial peroneal sensory response was absent, this happened in the setting of morbid obesity, bilateral lower extremity pitting edema.  He also has evidence of moderate bilateral carpal tunnel syndromes.  The most striking abnormality is on needle examinations, there are widespread active neuropathic changes, with evidence of moderate to severe active denervation, complex motor unit potential with decreased recruitment, most significant abnormality wearing right upper extremity muscles, distal more than proximal muscle; moderate at left upper extremity muscles; mild bilateral lower extremity involvement.  There is also evidence of chronic neuropathic changes involving right orbicularis oculi, frontalis muscles.  Needle examination of bilateral cervical paraspinal muscle, lumbar paraspinal muscles showed evidence of increased insertional activity, smaller amplitude polyphasic motor unit potential.  Personally reviewed MRI of cervical spine, multilevel degenerative changes, most obvious at C4-5, C5-6, mild spinal stenosis, variable degree of foraminal narrowing, subtle myelomalacia on axial gradient echo at C5-6, no change compared to previous scan April 17, 2019  Extensive laboratory evaluation showed normal  B12, RPR, C-reactive protein, ESR, thyroid panel, CPK, ferritin, ANA, Lyme titer, protein electrophoresis, vitamin D, HIV, A1c was only 6.3    REVIEW OF SYSTEMS: Full 14 system review of systems performed and notable only for  As above. All other review of systems were negative.  ALLERGIES: Allergies  Allergen Reactions  . Crestor [Rosuvastatin]     Muscle Pain   . Lipitor [Atorvastatin] Other (See Comments)    Arm weakness     HOME MEDICATIONS: Current Outpatient Medications  Medication Sig Dispense Refill  . acetaminophen (TYLENOL) 500 MG tablet Take 1,000 mg by mouth every 6 (six) hours as needed for moderate pain.    [name] albuterol (VENTOLIN HFA) 108 (90 Base) MCG/ACT inhaler Inhale 2 puffs into the lungs every 6 (six) hours as needed for wheezing or shortness of breath. 18 g 3  . amLODipine (NORVASC) 10 MG tablet TAKE 1 TABLET BY MOUTH DAILY. 90 tablet 0  . aspirin EC 81 MG tablet Take 81 mg by mouth daily.    . Cholecalciferol (VITAMIN D3 PO) Take 1 tablet by mouth daily.    . furosemide (LASIX) 20 MG tablet Take 1-2 tablets (20-40 mg total) by mouth daily as needed for edema.    . gabapentin (NEURONTIN) 300 MG capsule TAKE 1 CAPSULE BY MOUTH 3 TIMES DAILY 90 capsule 0  . Iron-Vitamins (GERITOL PO) Take by mouth.    [name] JANUVIA 100 MG tablet TAKE 1 TABLET BY MOUTH DAILY. 90 tablet 1  . lisinopril (ZESTRIL) 40 MG tablet TAKE 1 TABLET BY MOUTH DAILY. 90 tablet 1  . magnesium gluconate (MAGONATE) 500 MG tablet Take 500 mg by mouth daily.     . metFORMIN (GLUCOPHAGE) 500 MG tablet TAKE 2 TABLETS BY MOUTH 2 TIMES DAILY WITH A MEAL. (Patient taking differently: Take 1,000 mg by mouth 2 (two) times daily with a meal. ) 360 tablet 1  . metoprolol succinate (TOPROL-XL) 100 MG 24 hr tablet Take 1 tablet (100 mg total) by mouth daily. 90 tablet 1  . Misc Natural Products (TART CHERRY ADVANCED PO) Take 1 tablet by mouth daily.    .  Potassium 95 MG TABS Take 95 mg by mouth daily.    . rosuvastatin (CRESTOR) 5 MG tablet Take 5 mg by mouth daily.    . traZODone (DESYREL) 50 MG tablet TAKE 1/2 TO 1 TABLET BY MOUTH AT BEDTIME AS NEEDED FOR SLEEP 90 tablet 0  . TRUE METRIX BLOOD GLUCOSE TEST test strip   1  . TRUEPLUS LANCETS 30G MISC   1  . Turmeric Curcumin 500 MG CAPS Take by mouth.    . zinc gluconate 50 MG tablet Take 50 mg by mouth daily.     No current facility-administered medications for this visit.    PAST MEDICAL HISTORY: Past Medical History:  Diagnosis Date  . Appendicitis   . Blood in stool   . Coronary artery disease 1997   s/p PCI by Dr. Glade Lloyd  . Diverticulosis of colon with hemorrhage 2009  . Dyslipidemia, goal LDL below 70   . Edema   . Encounter for long-term (current) use of other medications   . Essential hypertension, benign   . Family history of thyroid disease   . Frequent unifocal PVCs   . 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 Morbid obesity ([place])   . Myalgia and myositis, unspecified   . Prostate cancer ([place]) 2012  . Pulmonary HTN ([place]) 08/14/2018   PASP 54mHg by echo 2017  .  Sleep apnea with use of continuous positive airway pressure (CPAP)   . Thrombocytopenia ([place]) 05/25/2014  . Type II or unspecified type diabetes mellitus without mention of complication, not stated as uncontrolled     PAST SURGICAL HISTORY: Past Surgical History:  Procedure Laterality Date  . ANGIOPLASTY  1997   Dr. Glade Lloyd  . APPENDECTOMY    . radioactive seed prostate      FAMILY HISTORY: Family History  Problem Relation Age of Onset  . CVA Father   . Hypertension Father   . Diabetes Mother   . Hypothyroidism Sister   . Hypertension Sister     SOCIAL HISTORY: Social History   Socioeconomic History  . Marital status: Married    Spouse name: Not on file  . Number of children: 0  . Years of education: 88  . Highest education level: High school graduate  Occupational History  .  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 Smoking status: Never Smoker  . Smokeless tobacco: Never Used  Substance and Sexual Activity  . Alcohol use: No  . Drug use: No  . Sexual activity: Not on file  Other Topics Concern  . Not on file  Social History Narrative   Lives at home with his wife.   Right-handed.   Caffeine: occasional use.   Social Determinants of Health   Financial Resource Strain:   . Difficulty of Paying Living Expenses:   Food Insecurity:   . Worried About Charity fundraiser in the Last Year:   . Arboriculturist in the Last Year:   Transportation Needs:   . Film/video editor (Medical):   [name] Lack of Transportation (Non-Medical):   Physical Activity:   . Days of Exercise per Week:   . Minutes of Exercise per Session:   Stress:   . Feeling of Stress :   Social Connections:   . Frequency of Communication with Friends and Family:   . Frequency of Social Gatherings with Friends and Family:   . Attends Religious Services:   . Active Member of Clubs or Organizations:   . Attends Archivist Meetings:   [name] Marital Status:   Intimate Partner Violence:   . Fear of Current or Ex-Partner:   . Emotionally Abused:   [name] Physically Abused:   . Sexually Abused:      PHYSICAL EXAM   There were no vitals filed for this visit.  Not recorded      There is no height or weight on file to calculate BMI.  PHYSICAL EXAMNIATION:  Gen: NAD, conversant, well nourised, well groomed                     Cardiovascular: Regular rate rhythm, no peripheral edema, warm, nontender. Eyes: Conjunctivae clear without exudates or hemorrhage Neck: Supple, no carotid bruits. Pulmonary: Clear to auscultation bilaterally   NEUROLOGICAL EXAM: Obese, sit in his rolling walker.  MENTAL STATUS: Speech:    Speech is normal; fluent and spontaneous with normal comprehension.  Cognition:     Orientation to  time, place and person     Normal recent and remote memory     Normal Attention span and concentration     Normal Language, naming, repeating,spontaneous speech     Fund of knowledge   CRANIAL NERVES: CN II: Visual fields are full to confrontation. Pupils are round equal and briskly reactive to light.  CN III, IV, VI: extraocular movement are normal. No ptosis. CN V: Facial sensation is intact to light touch CN VII: He has mild bilateral eye closure, cheek puff weakness CN VIII: Hearing is normal to causal conversation. CN IX, X: Phonation is normal. CN XI: Head turning and shoulder shrug are intact  MOTOR: Mild bilateral shoulder abduction weakness, mild bilateral hand grip weakness, mild bilateral hip flexion weakness.  Moderate bilateral toe flexion extension weakness.  REFLEXES: Areflexia plantar responses are flexor.  SENSORY: Preserved bilateral toe proprioception, vibratory sensation, pinprick.  COORDINATION: There is no trunk or limb dysmetria noted.  GAIT/STANCE: He needs to push on his wheelchair to get up from seated position, limited by her big body habitus, but steady,  DIAGNOSTIC DATA (LABS, IMAGING, TESTING) - I reviewed patient records, labs, notes, testing and imaging myself where available.   ASSESSMENT AND PLAN  ARSAL TAPPAN is a 64 y.o. male   Sudden onset bilateral hands weakness on April 16, 2019,  CSF showed elevated total protein of 60, evidence of cytoalbumin dissociation  He reported at least moderate improvement with IVIG treatment (from April 4-8 2021), from reviewing neurohospitalist note, he actually began to improve before IVIG treatment,  MRI of cervical spine also showed multilevel degenerative changes, subtle myelomalacia cord signal changes at C5-6,   Laboratory evaluation failed to demonstrate etiology  Electrodiagnostic study showed significant abnormality, there was widespread active neuropathic changes involving right facial, bilateral  upper extremity, lower extremity muscles,  With above extensive evaluations, 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   Marcial Pacas, M.D. Ph.D.  [facility] [address], [place], [place] [address] Ph: [phone] Fax: [phone]  CC: [name], MD

## 2019-06-24 NOTE — Telephone Encounter (Addendum)
Orders from Dr. Krista Blue:  Please arrange IVIG, loading 2 g.kg (500mg /kg x4) then 1g/kg (0.5g/kgx2) every 4 weeks. Total of 6 treatments.  Diagnosis: Acute Axonal Motor Neuropathy  Order sheet completed and signed by Dr. Krista Blue. It has been provided to [employer] for insurance verification and scheduling.

## 2019-06-24 NOTE — Telephone Encounter (Signed)
Called and gave the information to HR.

## 2019-06-24 NOTE — Procedures (Signed)
Full Name: Travis [name] Gender: Male MRN #: [id number] Date of Birth: [birth year]    Visit Date: 06/24/2019 07:49 Age: 64 Years Examining Physician: Marcial Pacas, MD  Referring Physician: Marcial Pacas, MD Height: 5 feet 9 inch History: 64 year old obese male, presented with sudden onset of bilateral hands weakness on April 16, 2019, significant improvement following IVIG treatment  Summary of the tests:  Nerve conduction study:  Bilateral sural, right ulnar sensory responses were normal.  Left superficial peroneal sensory response was absent.  Right superficial peroneal sensory response showed significantly decreased snap amplitude.  Bilateral median sensory response showed mild to moderately decreased snap amplitude, with moderately prolonged peak latency.  Bilateral tibial, left peroneal to EDB motor responses were normal.  Right peroneal to EDB motor response showed moderately decreased CMAP amplitude.  Right ulnar motor responses were normal.  Left median motor response showed mildly prolonged distal latency, with normal CMAP amplitude, conduction velocity.  Right median motor response showed normal distal latency, significantly decreased CMAP amplitude, with moderate slow conduction velocity  Bilateral tibial, right ulnar F-wave latency was well preserved, and within normal limit  Electromyography: Extensive needle examination was performed at selected muscles at bilateral upper, lower extremity muscles; bilateral cervical, lumbar sacral paraspinal muscles; right lower thoracic paraspinal muscles; right facial muscles.  There is evidence of widespread active neuropathic changes involving bilateral upper extremity muscles, significant neuropathic changes on the right upper extremity muscles, distal muscles more than proximal muscles, moderate neuropathic changes involving left upper extremity muscles, mild neuropathic changes involving bilateral lower extremity muscles.  There is  also evidence of chronic neuropathic changes involving right orbicularis oculi, frontalis muscles, as evident by enlarge complex motor unit potential with mildly decreased recruitment patterns.  Needle examination of bilateral cervical paraspinal muscles showed complex smaller motor unit potential, similar but milder of mild chronic neuropathic changes involving bilateral lumbar paraspinal muscles.  There was no spontaneous activity in the right thoracic paraspinal muscles.   Conclusion: This is a marked abnormal study.  There is electrodiagnostic evidence of widespread active neuropathic changes, involving bilateral upper, and lower extremity muscles, there is also evidence of mild chronic neuropathic changes involving right facial muscles.  Patient presented with acute onset bilateral upper extremity weakness, evidence of CSF  cytoalbumin dissociation.  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  In addition there is also evidence of moderate bilateral carpal tunnel syndromes.     ------------------------------- Marcial Pacas M.D. PhD  [facility] [address], [place] [address] Tel: [phone] Fax: [phone]  Verbal informed consent was obtained from the patient, patient was informed of potential risk of procedure, including bruising, bleeding, hematoma formation, infection, muscle weakness, muscle pain, numbness, among others.         [place]    Nerve / Sites Muscle Latency Ref. Amplitude Ref. Rel Amp Segments Distance Velocity Ref. Area    ms ms mV mV %  cm m/s m/s mVms  R Median - APB     Wrist APB 4.0 ?4.4 0.4 ?4.0 100 Wrist - APB 7   1.2     Upper arm APB 10.3  1.5  378 Upper arm - Wrist 23 37 ?49 4.9     Ulnar Wrist APB 3.6  0.7  49.6 Ulnar Wrist - Upper arm    1.5     Ulnar B. Elbow APB 8.3  0.9  126 Ulnar  Wrist - Wrist    1.6     Ulnar A. Elbow APB 10.4  0.7   75.2 Ulnar B. Elbow - Ulnar Wrist    1.8         Ulnar B. Elbow - Wrist             Ulnar A. Elbow - APB      L Median - APB     Wrist APB 4.6 ?4.4 4.0 ?4.0 100 Wrist - APB 7   13.5     Upper arm APB 9.5  3.3  82.5 Upper arm - Wrist 24 50 ?49 11.9  R Ulnar - ADM     Wrist ADM 2.8 ?3.3 6.0 ?6.0 100 Wrist - ADM 7   16.9     B.Elbow ADM 7.0  5.3  88.2 B.Elbow - Wrist 22 51 ?49 15.7     A.Elbow ADM 9.0  4.1  78.2 A.Elbow - B.Elbow 10 51 ?49 14.3         A.Elbow - Wrist      R Peroneal - EDB     Ankle EDB 4.8 ?6.5 1.1 ?2.0 100 Ankle - EDB 9   3.4     Fib head EDB 11.8  0.9  88.3 Fib head - Ankle 31 44 ?44 2.6     Pop fossa EDB 14.0  1.1  119 Pop fossa - Fib head 10 45 ?44 3.1         Pop fossa - Ankle      L Peroneal - EDB     Ankle EDB 4.7 ?6.5 2.2 ?2.0 100 Ankle - EDB 9   6.4     Fib head EDB 11.2  2.0  91.2 Fib head - Ankle 30 46 ?44 6.6     Pop fossa EDB 13.3  2.0  99.4 Pop fossa - Fib head 10 46 ?44 6.3         Pop fossa - Ankle      R Tibial - AH     Ankle AH 4.1 ?5.8 7.3 ?4.0 100 Ankle - AH 9   12.5     Pop fossa AH 12.7  1.2  16.6 Pop fossa - Ankle 36 42 ?41 2.7  L Tibial - AH     Ankle AH 4.6 ?5.8 6.0 ?4.0 100 Ankle - AH 9   15.8     Pop fossa AH 13.5  1.9  32.1 Pop fossa - Ankle 37 41 ?41 5.7                   SNC    Nerve / Sites Rec. Site Peak Lat Ref.  Amp Ref. Segments Distance    ms ms V V  cm  R Sural - Ankle (Calf)     Calf Ankle 4.1 ?4.4 9 ?6 Calf - Ankle 14  L Sural - Ankle (Calf)     Calf Ankle 3.8 ?4.4 8 ?6 Calf - Ankle 14  R Superficial peroneal - Ankle     Lat leg Ankle 4.2 ?4.4 2 ?6 Lat leg - Ankle 14  L Superficial peroneal - Ankle     Lat leg Ankle NR ?4.4 NR ?6 Lat leg - Ankle 14  R Median - Orthodromic (Dig II, Mid palm)     Dig II Wrist 3.9 ?3.4 4 ?10 Dig II - Wrist 13  L Median - Orthodromic (Dig II, Mid palm)     Dig II Wrist 4.1 ?3.4 6 ?  10 Dig II - Wrist 13  R Ulnar - Orthodromic, (Dig V, Mid palm)     Dig V Wrist 2.9 ?3.1 8 ?5 Dig V - Wrist  61                   F  Wave    Nerve F Lat Ref.   ms ms  R Tibial - AH 53.8 ?56.0  R Ulnar - ADM 31.6 ?32.0  L Tibial - AH 54.7 ?56.0           EMG Summary Table    Spontaneous MUAP Recruitment  Muscle IA Fib PSW Fasc Other Amp Dur. Poly Pattern  R. First dorsal interosseous Increased 3+ 3+ None _______ Increased Increased 1+ Reduced  R. Abductor pollicis brevis Increased 3+ 3+ None _______ Increased Increased 1+ Reduced  R. Pronator teres Increased 2+ 2+ None _______ Increased Increased 1+ Reduced  R. Flexor digitorum superficialis Increased 2+ 2+ None _______ Increased Increased 1+ Reduced  R. Flexor digitorum profundus (Ulnar) Increased 2+ 2+ None _______ Increased Increased 1+ Reduced  R. Flexor carpi ulnaris Increased 2+ None None _______ Increased Increased 1+ Reduced  R. Brachioradialis Increased 1+ 1+ None _______ Increased Increased 1+ Reduced  R. Extensor digitorum communis Increased 3+ 3+ None _______ Increased Increased 1+ Reduced  R. Biceps brachii Increased 1+ 1+ None _______ Increased Increased 1+ Reduced  R. Deltoid Increased 1+ 1+ None _______ Increased Increased 1+ Reduced  R. Triceps brachii Increased 1+ None None _______ Increased Increased 1+ Reduced  L. First dorsal interosseous Increased 2+ 2+ None _______ Increased Increased 1+ Reduced  L. Pronator teres Increased 1+ None None _______ Increased Increased 1+ Reduced  L. Flexor digitorum profundus (Ulnar) Increased 1+ None None _______ Increased Increased 1+ Reduced  L. Flexor carpi ulnaris Increased 1+ None None _______ Increased Increased 1+ Reduced  L. Biceps brachii Increased None None None _______ Increased Increased 1+ Reduced  L. Deltoid Increased None None None _______ Increased Increased 1+ Reduced  L. Triceps brachii Increased 1+ None None _______ Increased Increased 1+ Reduced  R. Cervical paraspinals Normal None None None _______ Increased Increased 1+ Normal  L. Cervical paraspinals Normal None None  None _______ Increased Increased 1+ Normal  R. Lumbar paraspinals (mid) Normal None None None _______ Increased Increased 1+ Normal  R. Lumbar paraspinals (low) Normal None None None _______ Increased Increased 1+ Normal  L. Lumbar paraspinals (mid) Normal None None None _______ Increased Increased 1+ Normal  L. Lumbar paraspinals (low) Normal None None None _______ Increased Increased 1+ Normal  R. Thoracic paraspinals (mid) Normal None None None _______ Increased Increased Normal Normal  R. Thoracic paraspinals (low) Normal None None None _______ Increased Increased Normal Normal  L. Tibialis anterior Increased None None None _______ Increased Increased 1+ Reduced  L. Tibialis posterior Increased None None None _______ Increased Increased 1+ Reduced  L. Peroneus longus Increased None None None _______ Increased Increased 1+ Reduced  L. Gastrocnemius (Medial head) Increased None None None _______ Increased Increased 1+ Reduced  L. Vastus lateralis Normal None None None _______ Increased Increased 1+ Reduced  R. Tibialis anterior Increased 1+ None None _______ Increased Increased 1+ Reduced  R. Tibialis posterior Increased None None None _______ Increased Increased 1+ Reduced  R. Peroneus longus Increased None None None _______ Increased Increased 1+ Reduced  R. Gastrocnemius (Medial head) Increased 1+ None None _______ Increased Increased 1+ Reduced  R. Orbicularis Oculi Normal None None None _______ None None None Reduced  R. Frontalis Normal None None None _______ None None None Reduced   R. Vastus lateralis Normal None None None _______ Increased Increased 1+ Reduced

## 2019-06-24 NOTE — Telephone Encounter (Signed)
We can start w/ 1 yr and then re-assess

## 2019-06-29 ENCOUNTER — Telehealth: Payer: Self-pay | Admitting: Neurology

## 2019-06-29 NOTE — Telephone Encounter (Signed)
[facility] umr order sent to GI. No auth they will reach out to the patient to schedule.

## 2019-07-06 ENCOUNTER — Other Ambulatory Visit: Payer: Self-pay | Admitting: Cardiology

## 2019-07-06 ENCOUNTER — Other Ambulatory Visit: Payer: Self-pay | Admitting: Family Medicine

## 2019-07-06 MED FILL — ROSUVASTATIN CALCIUM 10 MG: 10 | 90 days supply | Qty: 90 | Fill #3

## 2019-07-06 MED FILL — AMLODIPINE BESYLATE 10 MG T: 10 | 90 days supply | Qty: 90 | Fill #0

## 2019-07-06 MED FILL — FUROSEMIDE 20 MG TABS: 20 | 15 days supply | Qty: 60 | Fill #4

## 2019-07-06 MED FILL — JANUVIA 100 MG TABLET: 100 | 30 days supply | Qty: 30 | Fill #0

## 2019-07-06 MED FILL — METFORMIN HCL 500 MG TABS: 500 | 90 days supply | Qty: 360 | Fill #1

## 2019-07-07 MED FILL — GABAPENTIN 300 MG CAPSULE: 300 | 30 days supply | Qty: 90 | Fill #0

## 2019-07-07 MED FILL — METOPROLOL SUCCINATE ER 100: 100 | 90 days supply | Qty: 90 | Fill #0

## 2019-07-13 ENCOUNTER — Other Ambulatory Visit: Payer: Self-pay

## 2019-07-13 ENCOUNTER — Ambulatory Visit: Payer: 59 | Admitting: Family Medicine

## 2019-07-13 ENCOUNTER — Encounter: Payer: Self-pay | Admitting: Family Medicine

## 2019-07-13 VITALS — BP 138/83 | HR 86 | Temp 98.2°F | Resp 16 | Ht 69.0 in | Wt 325.2 lb

## 2019-07-13 DIAGNOSIS — E119 Type 2 diabetes mellitus without complications: Secondary | ICD-10-CM | POA: Diagnosis not present

## 2019-07-13 DIAGNOSIS — E785 Hyperlipidemia, unspecified: Secondary | ICD-10-CM | POA: Diagnosis not present

## 2019-07-13 DIAGNOSIS — I10 Essential (primary) hypertension: Secondary | ICD-10-CM | POA: Diagnosis not present

## 2019-07-13 DIAGNOSIS — E1169 Type 2 diabetes mellitus with other specified complication: Secondary | ICD-10-CM

## 2019-07-13 NOTE — Assessment & Plan Note (Signed)
Chronic problem.  Adequate control today.  Asymptomatic.  Check labs.  No anticipated med changes.  Will follow. 

## 2019-07-13 NOTE — Assessment & Plan Note (Signed)
Chronic problem.  On Januvia 100mg  daily and Metformin 1000mg  BID.  UTD on eye exam.  On ACE.  Encouraged low carb diet and continued activity as able.  Check labs.  Adjust meds prn

## 2019-07-13 NOTE — Assessment & Plan Note (Signed)
Chronic problem.  Tolerating statin w/o difficulty.  Encouraged him to continue his weight loss efforts.  Check labs.  Adjust meds prn

## 2019-07-13 NOTE — Assessment & Plan Note (Signed)
Pt is down 5 lbs since last visit.  Will continue to follow.

## 2019-07-13 NOTE — Patient Instructions (Signed)
Follow up in 3-4 months to recheck diabetes We'll notify you of your lab results and make any changes if needed Continue to work on healthy diet and regular exercise- you're down 5 lbs! Call with any questions or concerns Hang in there!!

## 2019-07-13 NOTE — Progress Notes (Signed)
   Subjective:    Patient ID: Travis [name], male    DOB: [birth year], 64 y.o.   MRN: [id number]  HPI DM- chronic problem, Januvia 100mg  daily, Metformin 1000mg  BID.  Last A1C 6.3  On ACE for renal protection.  UTD on eye exam, foot exam.  Pt is down 5 lbs since last visit.  Denies symptomatic lows.  HTN- chronic problem, on Metoprolol 1000mg  daily, Lisinopril 40mg  daily, Lasix 20mg  daily, Amlodipine 10mg  daily w/ adequate control.  No CP, SOB above baseline, HAs, visual changes.  Hyperlipidemia- chronic problem, on Crestor 10mg  daily.  Denies abd pain, N/V.   Review of Systems For ROS see HPI   This visit occurred during the SARS-CoV-2 public health emergency.  Safety protocols were in place, including screening questions prior to the visit, additional usage of staff PPE, and extensive cleaning of exam room while observing appropriate contact time as indicated for disinfecting solutions.       Objective:   Physical Exam Vitals reviewed.  Constitutional:      General: He is not in acute distress.    Appearance: He is well-developed. He is obese.  HENT:     Head: Normocephalic and atraumatic.  Eyes:     Conjunctiva/sclera: Conjunctivae normal.     Pupils: Pupils are equal, round, and reactive to light.  Neck:     Thyroid: No thyromegaly.  Cardiovascular:     Rate and Rhythm: Normal rate and regular rhythm.     Heart sounds: Normal heart sounds. No murmur heard.   Pulmonary:     Effort: Pulmonary effort is normal. No respiratory distress.     Breath sounds: Normal breath sounds.  Abdominal:     General: Bowel sounds are normal. There is no distension.     Palpations: Abdomen is soft.  Musculoskeletal:     Cervical back: Normal range of motion and neck supple.  Lymphadenopathy:     Cervical: No cervical adenopathy.  Skin:    General: Skin is warm and dry.  Neurological:     Mental Status: He is alert and oriented to person, place, and time.     Cranial Nerves: No cranial  nerve deficit.  Psychiatric:        Behavior: Behavior normal.           Assessment & Plan:

## 2019-07-14 ENCOUNTER — Encounter: Payer: Self-pay | Admitting: Family Medicine

## 2019-07-14 ENCOUNTER — Ambulatory Visit: Payer: 59 | Admitting: Family Medicine

## 2019-07-14 DIAGNOSIS — M170 Bilateral primary osteoarthritis of knee: Secondary | ICD-10-CM | POA: Diagnosis not present

## 2019-07-14 LAB — CBC WITH DIFFERENTIAL/PLATELET
Basophils Absolute: 0.1 10*3/uL (ref 0.0–0.1)
Basophils Relative: 0.4 % (ref 0.0–3.0)
Eosinophils Absolute: 0 10*3/uL (ref 0.0–0.7)
Eosinophils Relative: 0 % (ref 0.0–5.0)
HCT: 42.2 % (ref 39.0–52.0)
Hemoglobin: 14.3 g/dL (ref 13.0–17.0)
Lymphocytes Relative: 11.1 % — ABNORMAL LOW (ref 12.0–46.0)
Lymphs Abs: 1.3 10*3/uL (ref 0.7–4.0)
MCHC: 33.8 g/dL (ref 30.0–36.0)
MCV: 91 fl (ref 78.0–100.0)
Monocytes Absolute: 0.7 10*3/uL (ref 0.1–1.0)
Monocytes Relative: 5.9 % (ref 3.0–12.0)
Neutro Abs: 9.7 10*3/uL — ABNORMAL HIGH (ref 1.4–7.7)
Neutrophils Relative %: 82.6 % — ABNORMAL HIGH (ref 43.0–77.0)
Platelets: 181 10*3/uL (ref 150.0–400.0)
RBC: 4.64 Mil/uL (ref 4.22–5.81)
RDW: 14.6 % (ref 11.5–15.5)
WBC: 11.7 10*3/uL — ABNORMAL HIGH (ref 4.0–10.5)

## 2019-07-14 LAB — BASIC METABOLIC PANEL
BUN: 20 mg/dL (ref 6–23)
CO2: 27 mEq/L (ref 19–32)
Calcium: 9.7 mg/dL (ref 8.4–10.5)
Chloride: 101 mEq/L (ref 96–112)
Creatinine, Ser: 0.69 mg/dL (ref 0.40–1.50)
GFR: 115.77 mL/min (ref >60.00)
Glucose, Bld: 164 mg/dL — ABNORMAL HIGH (ref 70–99)
Potassium: 4.1 mEq/L (ref 3.5–5.1)
Sodium: 140 mEq/L (ref 135–145)

## 2019-07-14 LAB — HEPATIC FUNCTION PANEL
ALT: 37 U/L (ref 0–53)
AST: 35 U/L (ref 0–37)
Albumin: 4.3 g/dL (ref 3.5–5.2)
Alkaline Phosphatase: 63 U/L (ref 39–117)
Bilirubin, Direct: 0.1 mg/dL (ref 0.0–0.3)
Total Bilirubin: 0.5 mg/dL (ref 0.2–1.2)
Total Protein: 6.7 g/dL (ref 6.0–8.3)

## 2019-07-14 LAB — LDL CHOLESTEROL, DIRECT: Direct LDL: 66 mg/dL

## 2019-07-14 LAB — TSH: TSH: 1.15 u[IU]/mL (ref 0.35–4.50)

## 2019-07-14 LAB — LIPID PANEL
Cholesterol: 137 mg/dL (ref 0–200)
HDL: 29.5 mg/dL — ABNORMAL LOW (ref >39.00)
NonHDL: 107.96
Total CHOL/HDL Ratio: 5
Triglycerides: 331 mg/dL — ABNORMAL HIGH (ref 0.0–149.0)
VLDL: 66.2 mg/dL — ABNORMAL HIGH (ref 0.0–40.0)

## 2019-07-14 LAB — HEMOGLOBIN A1C: Hgb A1c MFr Bld: 6 % (ref 4.6–6.5)

## 2019-07-14 NOTE — Assessment & Plan Note (Signed)
Chronic problem with exacerbation.  Patient has near bone-on-bone osteoarthritic changes and is overweight that is contributing.  Discussed continuing gabapentin, icing regimen, home exercise, topical anti-inflammatories.  Patient is having difficulty even working secondary to patient size and pain and instability of the knees.  Patient is not a candidate for surgery at the moment.  Has not responded extremely well to viscosupplementation.  Follow-up with me again in 8 to 10 weeks.

## 2019-07-14 NOTE — Patient Instructions (Addendum)
Good to see you See me again in 10-12 weeks

## 2019-07-14 NOTE — Progress Notes (Signed)
[facility] [address] [place] Phone: [phone] Subjective:   I Kandace Blitz am serving as a Education administrator for Dr. Hulan Saas.  This visit occurred during the SARS-CoV-2 public health emergency.  Safety protocols were in place, including screening questions prior to the visit, additional usage of staff PPE, and extensive cleaning of exam room while observing appropriate contact time as indicated for disinfecting solutions.   I'm seeing this patient by the request  of:  [name], MD  CC: Bilateral knee pain  [id number]   06/01/2019 Chronic problem with exacerbation.  Severe knee arthritis noted.  Failed all conservative therapy.  Secondary to patient's age is not a surgical candidate.  Continue the gabapentin.  Continue topical anti-inflammatories, patient given Toradol injections x2 in the knees today.  Hopefully this will be beneficial.  Follow-up again in 6 to 8 weeks for further evaluation and treatment at that point can restart steroid injections.  Update 07/14/2019 CHRIS CRIPPS is a 64 y.o. male coming in with complaint of bilateral knee pain. Bilateral injections.  Patient states he continues to have pain on a daily basis.  Injections did give him some mild relief for some time.  Patient has been out of work for the last week secondary to the discomfort and pain.  Does not know if he will be able to continue working the role he is doing at this time    Past Medical History:  Diagnosis Date  . Appendicitis   . Blood in stool   . Coronary artery disease 1997   s/p PCI by Dr. Glade Lloyd  . Diverticulosis of colon with hemorrhage 2009  . Dyslipidemia, goal LDL below 70   . Edema   . Encounter for long-term (current) use of other medications   . Essential hypertension, benign   . Family history of thyroid disease   . Frequent unifocal PVCs   . GBS (Guillain Barre syndrome) ([facility])   . Morbid obesity ([facility])   . Myalgia  and myositis, unspecified   . Prostate cancer ([facility]) 2012  . Pulmonary HTN ([facility]) 08/14/2018   PASP 33mmHg by echo 2017  . Sleep apnea with use of continuous positive airway pressure (CPAP)   . Thrombocytopenia ([facility]) 05/25/2014  . Type II or unspecified type diabetes mellitus without mention of complication, not stated as uncontrolled    Past Surgical History:  Procedure Laterality Date  . ANGIOPLASTY  1997   Dr. Glade Lloyd  . APPENDECTOMY    . radioactive seed prostate     Social History   Socioeconomic History  . Marital status: Married    Spouse name: Not on file  . Number of children: 0  . Years of education: 66  . Highest education level: High school graduate  Occupational History  . 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 Smoking status: Never Smoker  . Smokeless tobacco: Never Used  Vaping Use  . Vaping Use: Never used  Substance and Sexual Activity  . Alcohol use: No  . Drug use: No  . Sexual activity: Not on file  Other Topics Concern  . Not on file  Social History Narrative   Lives at home with his wife.   Right-handed.   Caffeine: occasional use.   Social Determinants of Health   Financial Resource Strain:   . Difficulty of Paying Living Expenses:   Food Insecurity:   .  Worried About Charity fundraiser in the Last Year:   . Arboriculturist in the Last Year:   Transportation Needs:   . Film/video editor (Medical):   [name] Lack of Transportation (Non-Medical):   Physical Activity:   . Days of Exercise per Week:   . Minutes of Exercise per Session:   Stress:   . Feeling of Stress :   Social Connections:   . Frequency of Communication with Friends and Family:   . Frequency of Social Gatherings with Friends and Family:   . Attends Religious Services:   . Active Member of Clubs or Organizations:   . Attends Archivist Meetings:   [name] Marital Status:    Allergies  Allergen  Reactions  . Crestor [Rosuvastatin]     Muscle Pain   . Lipitor [Atorvastatin] Other (See Comments)    Arm weakness    Family History  Problem Relation Age of Onset  . CVA Father   . Hypertension Father   . Diabetes Mother   . Hypothyroidism Sister   . Hypertension Sister     Current Outpatient Medications (Endocrine & Metabolic):  [name]  JANUVIA 100 MG tablet, TAKE 1 TABLET BY MOUTH DAILY. .  metFORMIN (GLUCOPHAGE) 500 MG tablet, TAKE 2 TABLETS BY MOUTH 2 TIMES DAILY WITH A MEAL. (Patient taking differently: Take 1,000 mg by mouth 2 (two) times daily with a meal. )  Current Outpatient Medications (Cardiovascular):  .  amLODipine (NORVASC) 10 MG tablet, TAKE 1 TABLET BY MOUTH DAILY. .  furosemide (LASIX) 20 MG tablet, Take 1-2 tablets (20-40 mg total) by mouth daily as needed for edema. [name]  lisinopril (ZESTRIL) 40 MG tablet, TAKE 1 TABLET BY MOUTH DAILY. .  metoprolol succinate (TOPROL-XL) 100 MG 24 hr tablet, TAKE 1 TABLET (100 MG TOTAL) BY MOUTH DAILY. .  rosuvastatin (CRESTOR) 10 MG tablet, Take 10 mg by mouth daily.  Current Outpatient Medications (Respiratory):  .  albuterol (VENTOLIN HFA) 108 (90 Base) MCG/ACT inhaler, Inhale 2 puffs into the lungs every 6 (six) hours as needed for wheezing or shortness of breath.  Current Outpatient Medications (Analgesics):  .  acetaminophen (TYLENOL) 500 MG tablet, Take 1,000 mg by mouth every 6 (six) hours as needed for moderate pain. [name]  aspirin EC 81 MG tablet, Take 81 mg by mouth daily.  Current Outpatient Medications (Hematological):  [name]  Cyanocobalamin (VITAMIN B-12 PO), Take by mouth.  Current Outpatient Medications (Other):  [name]  Cholecalciferol (VITAMIN D3 PO), Take 1 tablet by mouth daily. [name]  gabapentin (NEURONTIN) 300 MG capsule, TAKE 1 CAPSULE BY MOUTH 3 TIMES DAILY .  Iron-Vitamins (GERITOL PO), Take by mouth. .  magnesium gluconate (MAGONATE) 500 MG tablet, Take 500 mg by mouth daily.  .  Misc Natural Products (TART CHERRY  ADVANCED PO), Take 1 tablet by mouth daily. .  Potassium 95 MG TABS, Take 95 mg by mouth daily. .  traZODone (DESYREL) 50 MG tablet, TAKE 1/2 TO 1 TABLET BY MOUTH AT BEDTIME AS NEEDED FOR SLEEP .  TRUE METRIX BLOOD GLUCOSE TEST test strip,  .  TRUEPLUS LANCETS 30G MISC,  .  Turmeric Curcumin 500 MG CAPS, Take by mouth. .  zinc gluconate 50 MG tablet, Take 50 mg by mouth daily.   Reviewed prior external information including notes and imaging from  primary care provider As well as notes that were available from care everywhere and other healthcare systems.  Past medical history, social, surgical and family history  all reviewed in electronic medical record.  No pertanent information unless stated regarding to the chief complaint.   Review of Systems:  No headache, visual changes, nausea, vomiting, diarrhea, constipation, dizziness, abdominal pain, skin rash, fevers, chills, night sweats, weight loss, swollen lymph nodes,  chest pain, shortness of breath, mood changes. POSITIVE muscle aches, body aches, joint swelling   Objective  Blood pressure 124/80, pulse 96, height 5\' 9"  (1.753 m), weight (!) 328 lb (148.8 kg), SpO2 96 %.   General: No apparent distress alert and oriented x3 mood and affect normal, dressed appropriately.  Morbidly obese HEENT: Pupils equal, extraocular movements intact  Respiratory: Patient's speak in full sentences and does not appear short of breath  Cardiovascular: 3+ lower extremity edema, non tender, no erythema  Gait severely antalgic walking with the aid of a rolling walker. MSK: Knee: Bilateral valgus deformity noted. Large thigh to calf ratio.  Tender to palpation over medial and PF joint line.  ROM full in flexion and extension and lower leg rotation. instability with valgus force.  painful patellar compression. Patellar glide with moderate crepitus. Patellar and quadriceps tendons unremarkable. Hamstring and quadriceps strength is normal.  After  informed written and verbal consent, patient was seated on exam table. Right knee was prepped with alcohol swab and utilizing anterolateral approach, patient's right knee space was injected with 4:1  marcaine 0.5%: Kenalog 40mg /dL. Patient tolerated the procedure well without immediate complications.  After informed written and verbal consent, patient was seated on exam table. Left knee was prepped with alcohol swab and utilizing anterolateral approach, patient's left knee space was injected with 4:1  marcaine 0.5%: Kenalog 40mg /dL. Patient tolerated the procedure well without immediate complications.   Impression and Recommendations:     The above documentation has been reviewed and is accurate and complete Lyndal Pulley, DO       Note: This dictation was prepared with Dragon dictation along with smaller phrase technology. Any transcriptional errors that result from this process are unintentional.

## 2019-07-15 ENCOUNTER — Ambulatory Visit
Admission: RE | Admit: 2019-07-15 | Discharge: 2019-07-15 | Disposition: A | Discharge status: Home / Self Care | Payer: 59 | Source: Ambulatory Visit | Attending: Neurology | Admitting: Neurology

## 2019-07-15 ENCOUNTER — Other Ambulatory Visit: Payer: Self-pay

## 2019-07-15 DIAGNOSIS — R269 Unspecified abnormalities of gait and mobility: Secondary | ICD-10-CM

## 2019-07-15 DIAGNOSIS — I2510 Atherosclerotic heart disease of native coronary artery without angina pectoris: Secondary | ICD-10-CM | POA: Diagnosis not present

## 2019-07-15 DIAGNOSIS — R29898 Other symptoms and signs involving the musculoskeletal system: Secondary | ICD-10-CM

## 2019-07-15 DIAGNOSIS — R937 Abnormal findings on diagnostic imaging of other parts of musculoskeletal system: Secondary | ICD-10-CM

## 2019-07-15 MED ORDER — IOPAMIDOL (ISOVUE-370) INJECTION 76%
75.0000 mL | Freq: Once | INTRAVENOUS | Status: AC | PRN
  Administered 2019-07-15: 75 mL via INTRAVENOUS

## 2019-07-24 ENCOUNTER — Ambulatory Visit (INDEPENDENT_AMBULATORY_CARE_PROVIDER_SITE_OTHER): Payer: 59 | Admitting: Plastic Surgery

## 2019-07-24 ENCOUNTER — Encounter: Payer: Self-pay | Admitting: Family Medicine

## 2019-07-24 ENCOUNTER — Encounter: Payer: Self-pay | Admitting: Plastic Surgery

## 2019-07-24 ENCOUNTER — Other Ambulatory Visit: Payer: Self-pay

## 2019-07-24 VITALS — BP 132/84 | HR 67 | Temp 98.4°F

## 2019-07-24 DIAGNOSIS — C44212 Basal cell carcinoma of skin of right ear and external auricular canal: Secondary | ICD-10-CM | POA: Diagnosis not present

## 2019-07-24 DIAGNOSIS — H61111 Acquired deformity of pinna, right ear: Secondary | ICD-10-CM | POA: Diagnosis not present

## 2019-07-24 NOTE — Progress Notes (Signed)
Operative Note   DATE OF OPERATION: 07/24/2019  SURGICAL DEPARTMENT: Plastic Surgery  PREOPERATIVE DIAGNOSES: 2.5 x 1 cm right helical rim defect  POSTOPERATIVE DIAGNOSES:  same  PROCEDURE: Closure of right helical rim defect with adjacent tissue transfer totaling 6 x 2 cm including the primary and secondary defects  SURGEON: Talmadge Coventry, MD  ASSISTANT: None  ANESTHESIA: Local.   COMPLICATIONS: None.   INDICATIONS FOR PROCEDURE:  The patient, Travis [name] is a 64 y.o. 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 Risks, benefits and alternatives were fully discussed. Specific risks including but not limited to bleeding, infection, hematoma, seroma, scarring, pain, contracture, asymmetry, wound healing problems, and need for further surgery were all discussed. The patient did have an ample opportunity to have questions answered to satisfaction.   DESCRIPTION OF PROCEDURE:  Local anesthesia was administered circumferentially around the base of the ear for a total field block.  This was done with lidocaine with epinephrine.  Ear was then prepped and draped in standard fashion.  I started by freshening up the edges of the defect with a 15 blade.  I then made an incision superiorly and inferior along the groove anterior to the helical rim.  I extended this incision deep through the cartilage throughout.  A back cut was made superiorly to allow further advancement.  The circumference of the ear cartilage was then trimmed a bit to allow and improve advancement of the flaps.  The posterior surface of the ear was then undermined in the subcutaneous plane.  The flaps were then advanced and inset with interrupted Monocryl sutures.  The remainder the incisions were closed with a combination of running and  interrupted Monocryl sutures.  This gave a nice on table result with a congruent helical rim.  A soft dressing was then applied.  The patient tolerated the procedure well.  There were no complications.

## 2019-08-06 ENCOUNTER — Other Ambulatory Visit: Payer: Self-pay

## 2019-08-06 ENCOUNTER — Encounter: Payer: Self-pay | Admitting: Plastic Surgery

## 2019-08-06 ENCOUNTER — Ambulatory Visit (INDEPENDENT_AMBULATORY_CARE_PROVIDER_SITE_OTHER): Payer: 59 | Admitting: Plastic Surgery

## 2019-08-06 VITALS — BP 110/69 | HR 82 | Temp 98.5°F

## 2019-08-06 DIAGNOSIS — H61111 Acquired deformity of pinna, right ear: Secondary | ICD-10-CM

## 2019-08-06 NOTE — Progress Notes (Signed)
Patient presents postop for right helical rim reconstruction with antia-buch flap.  He is doing great with no complaints.  On exam everything is healing fine.  He has a nicely aligned helical rim.  There is a little bit of scabbing in the superior helical rim but all the tissue is viable and this should flake off soon.  We will plan to see him again in a few weeks to just to make sure everything is healed well.  All the sutures are dissolvable.

## 2019-08-10 ENCOUNTER — Other Ambulatory Visit: Payer: Self-pay | Admitting: Family Medicine

## 2019-08-10 DIAGNOSIS — G619 Inflammatory polyneuropathy, unspecified: Secondary | ICD-10-CM | POA: Diagnosis not present

## 2019-08-10 MED FILL — JANUVIA 100 MG TABLET: 100 | 30 days supply | Qty: 30 | Fill #1

## 2019-08-10 MED FILL — GABAPENTIN 300 MG CAPSULE: 300 | 30 days supply | Qty: 90 | Fill #0

## 2019-08-11 DIAGNOSIS — G619 Inflammatory polyneuropathy, unspecified: Secondary | ICD-10-CM | POA: Diagnosis not present

## 2019-08-12 DIAGNOSIS — G619 Inflammatory polyneuropathy, unspecified: Secondary | ICD-10-CM | POA: Diagnosis not present

## 2019-08-13 DIAGNOSIS — G619 Inflammatory polyneuropathy, unspecified: Secondary | ICD-10-CM | POA: Diagnosis not present

## 2019-08-21 ENCOUNTER — Telehealth: Payer: Self-pay | Admitting: Family Medicine

## 2019-08-21 NOTE — Telephone Encounter (Signed)
Please advise, this appears it was D/C at discharge.

## 2019-08-24 NOTE — Telephone Encounter (Signed)
Patient has called back in regard.    States his osteoarthritis is flaring up in his back and thumbs.   I have scheduled patient for a visit with Cody on 8/10.  Please advise if patient does not need OV.

## 2019-08-24 NOTE — Telephone Encounter (Signed)
Agree w appt

## 2019-08-24 NOTE — Telephone Encounter (Signed)
Please advise 

## 2019-08-25 ENCOUNTER — Other Ambulatory Visit: Payer: Self-pay

## 2019-08-25 ENCOUNTER — Other Ambulatory Visit: Payer: Self-pay | Admitting: Family Medicine

## 2019-08-25 ENCOUNTER — Ambulatory Visit: Payer: 59 | Admitting: Physician Assistant

## 2019-08-25 ENCOUNTER — Encounter: Payer: Self-pay | Admitting: Physician Assistant

## 2019-08-25 VITALS — BP 122/80 | HR 83 | Temp 98.5°F | Resp 16 | Ht 69.0 in | Wt 337.0 lb

## 2019-08-25 DIAGNOSIS — M159 Polyosteoarthritis, unspecified: Secondary | ICD-10-CM | POA: Diagnosis not present

## 2019-08-25 MED ORDER — MELOXICAM 15 MG PO TABS: 15.0000 mg | ORAL_TABLET | Freq: Every day | ORAL | 0 refills | Status: DC

## 2019-08-25 MED FILL — MELOXICAM 15 MG TABLET: 15 | 30 days supply | Qty: 30 | Fill #0

## 2019-08-25 NOTE — Patient Instructions (Signed)
I have restarted the Meloxicam that can be used up to once daily as needed.  I really want you to work on using OTC Voltaren gel in the morning and near bedtime to help with pain.   This can cut down on need for the meloxicam. Make sure if taking you are taking with food -- not on an empty stomach.  Follow-up in 3-4 weeks via video visit with Dr. Birdie Riddle.

## 2019-08-27 NOTE — Progress Notes (Signed)
Patient presents to clinic today to discuss restarting Meloxicam to help with his ongoing pain from OA of multiple sites, most pronounced of spine and hands bilaterally. Was previously on this but has not taken since previous hospitalization in April for cervical myelopathy. Notes he is having increased level of aches and pains. Was on Meloxicam for quite some time from his PCP without any issue.   Past Medical History:  Diagnosis Date  . Appendicitis   . Blood in stool   . Coronary artery disease 1997   s/p PCI by Dr. Glade Lloyd  . Diverticulosis of colon with hemorrhage 2009  . Dyslipidemia, goal LDL below 70   . Edema   . Encounter for long-term (current) use of other medications   . Essential hypertension, benign   . Family history of thyroid disease   . Frequent unifocal PVCs   . GBS (Guillain Barre syndrome) ([place])   . Morbid obesity ([employer])   . Myalgia and myositis, unspecified   . Prostate cancer ([place]) 2012  . Pulmonary HTN ([place]) 08/14/2018   PASP 30mHg by echo 2017  . Sleep apnea with use of continuous positive airway pressure (CPAP)   . Thrombocytopenia ([place] 05/25/2014  . Type II or unspecified type diabetes mellitus without mention of complication, not stated as uncontrolled     Current Outpatient Medications on File Prior to Visit  Medication Sig Dispense Refill  . acetaminophen (TYLENOL) 500 MG tablet Take 1,000 mg by mouth every 6 (six) hours as needed for moderate pain.    .Marland Kitchenalbuterol (VENTOLIN HFA) 108 (90 Base) MCG/ACT inhaler Inhale 2 puffs into the lungs every 6 (six) hours as needed for wheezing or shortness of breath. 18 g 3  . amLODipine (NORVASC) 10 MG tablet TAKE 1 TABLET BY MOUTH DAILY. 90 tablet 0  . aspirin EC 81 MG tablet Take 81 mg by mouth daily.    . Cholecalciferol (VITAMIN D3 PO) Take 1 tablet by mouth daily.    . Cyanocobalamin (VITAMIN B-12 PO) Take by mouth.    . furosemide (LASIX) 20 MG tablet Take 1-2 tablets (20-40 mg total) by mouth daily as  needed for edema.    . gabapentin (NEURONTIN) 300 MG capsule TAKE 1 CAPSULE BY MOUTH 3 TIMES DAILY 90 capsule 0  . Iron-Vitamins (GERITOL PO) Take by mouth.    .Marland KitchenJANUVIA 100 MG tablet TAKE 1 TABLET BY MOUTH DAILY. 30 tablet 1  . lisinopril (ZESTRIL) 40 MG tablet TAKE 1 TABLET BY MOUTH DAILY. 90 tablet 1  . magnesium gluconate (MAGONATE) 500 MG tablet Take 500 mg by mouth daily.     . metFORMIN (GLUCOPHAGE) 500 MG tablet TAKE 2 TABLETS BY MOUTH 2 TIMES DAILY WITH A MEAL. (Patient taking differently: Take 1,000 mg by mouth 2 (two) times daily with a meal. ) 360 tablet 1  . metoprolol succinate (TOPROL-XL) 100 MG 24 hr tablet TAKE 1 TABLET (100 MG TOTAL) BY MOUTH DAILY. 90 tablet 1  . Misc Natural Products (TART CHERRY ADVANCED PO) Take 1 tablet by mouth daily.    . Potassium 95 MG TABS Take 95 mg by mouth daily.    . rosuvastatin (CRESTOR) 10 MG tablet Take 10 mg by mouth daily.    . traZODone (DESYREL) 50 MG tablet TAKE 1/2 TO 1 TABLET BY MOUTH AT BEDTIME AS NEEDED FOR SLEEP 90 tablet 0  . TRUE METRIX BLOOD GLUCOSE TEST test strip   1  . TRUEPLUS LANCETS 30G MISC   1  .  Turmeric Curcumin 500 MG CAPS Take by mouth.    . zinc gluconate 50 MG tablet Take 50 mg by mouth daily.     No current facility-administered medications on file prior to visit.    Allergies  Allergen Reactions  . Crestor [Rosuvastatin]     Muscle Pain   . Lipitor [Atorvastatin] Other (See Comments)    Arm weakness     Family History  Problem Relation Age of Onset  . CVA Father   . Hypertension Father   . Diabetes Mother   . Hypothyroidism Sister   . Hypertension Sister     Social History   Socioeconomic History  . Marital status: Married    Spouse name: Not on file  . Number of children: 0  . Years of education: 79  . Highest education level: High school graduate  Occupational History  . 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 Smoking  status: Never Smoker  . Smokeless tobacco: Never Used  Vaping Use  . Vaping Use: Never used  Substance and Sexual Activity  . Alcohol use: No  . Drug use: No  . Sexual activity: Not on file  Other Topics Concern  . Not on file  Social History Narrative   Lives at home with his wife.   Right-handed.   Caffeine: occasional use.   Social Determinants of Health   Financial Resource Strain:   . Difficulty of Paying Living Expenses:   Food Insecurity:   . Worried About Charity fundraiser in the Last Year:   . Arboriculturist in the Last Year:   Transportation Needs:   . Film/video editor (Medical):   [name] Lack of Transportation (Non-Medical):   Physical Activity:   . Days of Exercise per Week:   . Minutes of Exercise per Session:   Stress:   . Feeling of Stress :   Social Connections:   . Frequency of Communication with Friends and Family:   . Frequency of Social Gatherings with Friends and Family:   . Attends Religious Services:   . Active Member of Clubs or Organizations:   . Attends Archivist Meetings:   [name] Marital Status:     Review of Systems - See HPI.  All other ROS are negative.  BP 122/80   Pulse 83   Temp 98.5 F (36.9 C) (Temporal)   Resp 16   Ht '5\' 9"'  (1.753 m)   Wt (!) 337 lb (152.9 kg)   SpO2 98%   BMI 49.77 kg/m   Physical Exam Vitals reviewed.  Constitutional:      Appearance: Normal appearance.  HENT:     Head: Normocephalic and atraumatic.  Cardiovascular:     Rate and Rhythm: Normal rate.     Pulses: Normal pulses.     Heart sounds: Normal heart sounds.  Pulmonary:     Effort: Pulmonary effort is normal.  Musculoskeletal:     Right hand: Tenderness present. No swelling. Normal range of motion.     Left hand: Tenderness present. No swelling. Normal range of motion.     Cervical back: Neck supple.  Neurological:     General: No focal deficit present.     Mental Status: He is alert.    Recent Results (from the past 2160  hour(s))  C-reactive protein     Status: None   Collection Time: 06/02/19  9:05 AM  Result  Value Ref Range   CRP 6 0 - 10 mg/L  RPR     Status: None   Collection Time: 06/02/19  9:05 AM  Result Value Ref Range   RPR Ser Ql Non Reactive Non Reactive  Vitamin B12     Status: None   Collection Time: 06/02/19  9:05 AM  Result Value Ref Range   Vitamin B-12 257 232 - 1,245 pg/mL  CK     Status: None   Collection Time: 06/02/19  9:05 AM  Result Value Ref Range   Total CK 55 41.0 - 331.0 U/L  Thyroid Panel With TSH     Status: None   Collection Time: 06/02/19  9:05 AM  Result Value Ref Range   TSH 0.846 0.450 - 4.500 uIU/mL   T4, Total 7.4 4.5 - 12.0 ug/dL   T3 Uptake Ratio 30 24 - 39 %   Free Thyroxine Index 2.2 1.2 - 4.9  Sedimentation rate     Status: None   Collection Time: 06/02/19  9:05 AM  Result Value Ref Range   Sed Rate 14 0 - 30 mm/hr  Ferritin     Status: None   Collection Time: 06/02/19  9:05 AM  Result Value Ref Range   Ferritin 70 30.0 - 400.0 ng/mL  Copper, serum     Status: None   Collection Time: 06/02/19  9:05 AM  Result Value Ref Range   Copper 113 69 - 132 ug/dL    Comment:                                     Detection Limit = 5               **Please note reference interval change**   ANA w/Reflex if Positive     Status: None   Collection Time: 06/02/19  9:05 AM  Result Value Ref Range   Anti Nuclear Antibody (ANA) Negative Negative  Multiple Myeloma Panel (SPEP&IFE w/QIG)     Status: Abnormal   Collection Time: 06/02/19  9:05 AM  Result Value Ref Range   IgG (Immunoglobin G), Serum 808 603 - 1,613 mg/dL   IgA/Immunoglobulin A, Serum 274 61 - 437 mg/dL   IgM (Immunoglobulin M), Srm 99 20 - 172 mg/dL   Total Protein 6.8 6.0 - 8.5 g/dL   Albumin SerPl Elph-Mcnc 3.7 2.9 - 4.4 g/dL   Alpha 1 0.2 0.0 - 0.4 g/dL   Alpha2 Glob SerPl Elph-Mcnc 0.9 0.4 - 1.0 g/dL   B-Globulin SerPl Elph-Mcnc 1.2 0.7 - 1.3 g/dL   Gamma Glob SerPl Elph-Mcnc 0.8 0.4 - 1.8 g/dL    M Protein SerPl Elph-Mcnc Not Observed Not Observed g/dL   Globulin, Total 3.1 2.2 - 3.9 g/dL   Albumin/Glob SerPl 1.2 0.7 - 1.7   IFE 1 Comment (A)     Comment: 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    Please Note Comment     Comment: Protein electrophoresis scan will follow via computer, mail, or courier delivery.   B. burgdorfi antibodies     Status: None   Collection Time: 06/02/19  9:05 AM  Result Value Ref Range   Lyme IgG/IgM Ab <0.91 0.00 - 0.90 ISR    Comment:  Negative         <0.91                                 Equivocal  0.91 - 1.09                                 Positive         >1.09   Hemoglobin A1c     Status: None   Collection Time: 07/13/19  2:43 PM  Result Value Ref Range   Hgb A1c MFr Bld 6.0 4.6 - 6.5 %    Comment: Glycemic Control Guidelines for People with Diabetes:Non Diabetic:  <6%Goal of Therapy: <7%Additional Action Suggested:  >8%   Lipid panel     Status: Abnormal   Collection Time: 07/13/19  2:43 PM  Result Value Ref Range   Cholesterol 137 0 - 200 mg/dL    Comment: ATP III Classification       Desirable:  < 200 mg/dL               Borderline High:  200 - 239 mg/dL          High:  > = 240 mg/dL   Triglycerides 331.0 (H) 0 - 149 mg/dL    Comment: Normal:  <150 mg/dLBorderline High:  150 - 199 mg/dL   HDL 29.50 (L) >39.00 mg/dL   VLDL 66.2 (H) 0.0 - 40.0 mg/dL   Total CHOL/HDL Ratio 5     Comment:                Men          Women1/2 Average Risk     3.4          3.3Average Risk          5.0          4.42X Average Risk          9.6          7.13X Average Risk          15.0          11.0                       NonHDL 107.96     Comment: NOTE:  Non-HDL goal should be 30 mg/dL higher than patient's LDL goal (i.e. LDL goal of < 70 mg/dL, would have non-HDL goal of < 100 mg/dL)  Basic metabolic panel     Status: Abnormal   Collection Time: 07/13/19  2:43 PM  Result Value Ref  Range   Sodium 140 135 - 145 mEq/L   Potassium 4.1 3.5 - 5.1 mEq/L   Chloride 101 96 - 112 mEq/L   CO2 27 19 - 32 mEq/L   Glucose, Bld 164 (H) 70 - 99 mg/dL   BUN 20 6 - 23 mg/dL   Creatinine, Ser 0.69 0.40 - 1.50 mg/dL   GFR 115.77 >60.00 mL/min   Calcium 9.7 8.4 - 10.5 mg/dL  TSH     Status: None   Collection Time: 07/13/19  2:43 PM  Result Value Ref Range   TSH 1.15 0.35 - 4.50 uIU/mL  Hepatic function panel     Status: None   Collection Time: 07/13/19  2:43 PM  Result Value Ref Range   Total Bilirubin 0.5 0.2 -  1.2 mg/dL   Bilirubin, Direct 0.1 0.0 - 0.3 mg/dL   Alkaline Phosphatase 63 39 - 117 U/L   AST 35 0 - 37 U/L   ALT 37 0 - 53 U/L   Total Protein 6.7 6.0 - 8.3 g/dL   Albumin 4.3 3.5 - 5.2 g/dL  CBC with Differential/Platelet     Status: Abnormal   Collection Time: 07/13/19  2:43 PM  Result Value Ref Range   WBC 11.7 (H) 4.0 - 10.5 K/uL   RBC 4.64 4.22 - 5.81 Mil/uL   Hemoglobin 14.3 13.0 - 17.0 g/dL   HCT 42.2 39 - 52 %   MCV 91.0 78.0 - 100.0 fl   MCHC 33.8 30.0 - 36.0 g/dL   RDW 14.6 11.5 - 15.5 %   Platelets 181.0 150 - 400 K/uL   Neutrophils Relative % 82.6 (H) 43 - 77 %   Lymphocytes Relative 11.1 (L) 12 - 46 %   Monocytes Relative 5.9 3 - 12 %   Eosinophils Relative 0.0 0 - 5 %   Basophils Relative 0.4 0 - 3 %   Neutro Abs 9.7 (H) 1.4 - 7.7 K/uL   Lymphs Abs 1.3 0.7 - 4.0 K/uL   Monocytes Absolute 0.7 0 - 1 K/uL   Eosinophils Absolute 0.0 0 - 0 K/uL   Basophils Absolute 0.1 0 - 0 K/uL  LDL cholesterol, direct     Status: None   Collection Time: 07/13/19  2:43 PM  Result Value Ref Range   Direct LDL 66.0 mg/dL    Comment: Optimal:  <100 mg/dLNear or Above Optimal:  100-129 mg/dLBorderline High:  130-159 mg/dLHigh:  160-189 mg/dLVery High:  >190 mg/dL    Assessment/Plan: 1. Generalized osteoarthritis of multiple sites Reviewed EMR including prior hospitalization. Was discontinued on discharge due to patient had not been taking. No contraindication  to restarting Meloxicam on PRN basis. Renal function stable. No history of bleeding ulcer or intolerance to medication. He is to start Voltaren gel to the hands to cut down on need for oral NSAID. Supportive measures reviewed. Follow-up scheduled with PCP. Ongoing Rx will be at her discretion. - meloxicam (MOBIC) 15 MG tablet; Take 1 tablet (15 mg total) by mouth daily.  Dispense: 30 tablet; Refill: 0  This visit occurred during the SARS-CoV-2 public health emergency.  Safety protocols were in place, including screening questions prior to the visit, additional usage of staff PPE, and extensive cleaning of exam room while observing appropriate contact time as indicated for disinfecting solutions.   Leeanne Rio, PA-C

## 2019-09-03 ENCOUNTER — Other Ambulatory Visit: Payer: Self-pay

## 2019-09-03 ENCOUNTER — Ambulatory Visit (INDEPENDENT_AMBULATORY_CARE_PROVIDER_SITE_OTHER): Payer: 59 | Admitting: Surgical

## 2019-09-03 ENCOUNTER — Encounter: Payer: Self-pay | Admitting: Surgical

## 2019-09-03 VITALS — BP 117/73 | HR 86

## 2019-09-03 DIAGNOSIS — H61111 Acquired deformity of pinna, right ear: Secondary | ICD-10-CM

## 2019-09-03 NOTE — Progress Notes (Signed)
Patient is a 64 year old male here for follow-up after right helical rim reconstruction with Dr. Claudia Desanctis.  Patient is doing well and has no complaints at this time.    On exam everything is healing nicely, he does have a little bit of drainage and scabbing along the posterior helical rim.  All the incisions are intact and there is no necrosis or signs of tissue compromise.  There is no redness or drainage noted.  Suture knots were removed today -the majority of them were already dissolved.  Recommend following up as needed, call with any questions or concerns.  Pictures were obtained of the patient and placed in the chart with the patient's or guardian's permission.

## 2019-09-07 DIAGNOSIS — Z0271 Encounter for disability determination: Secondary | ICD-10-CM

## 2019-09-08 ENCOUNTER — Other Ambulatory Visit: Payer: Self-pay | Admitting: Family Medicine

## 2019-09-08 MED FILL — FUROSEMIDE 20 MG TABS: 20 | 15 days supply | Qty: 60 | Fill #5

## 2019-09-08 MED FILL — JANUVIA 100 MG TABLET: 100 | 30 days supply | Qty: 30 | Fill #0

## 2019-09-08 MED FILL — traZODone HCL 50 MG TABS: 50 | 90 days supply | Qty: 90 | Fill #0

## 2019-09-08 MED FILL — LISINOPRIL 40 MG TABS: 40 | 90 days supply | Qty: 90 | Fill #1

## 2019-09-09 DIAGNOSIS — G619 Inflammatory polyneuropathy, unspecified: Secondary | ICD-10-CM | POA: Diagnosis not present

## 2019-09-10 DIAGNOSIS — G619 Inflammatory polyneuropathy, unspecified: Secondary | ICD-10-CM | POA: Diagnosis not present

## 2019-09-15 ENCOUNTER — Encounter: Payer: Self-pay | Admitting: Family Medicine

## 2019-09-15 ENCOUNTER — Telehealth (INDEPENDENT_AMBULATORY_CARE_PROVIDER_SITE_OTHER): Payer: 59 | Admitting: Family Medicine

## 2019-09-15 ENCOUNTER — Other Ambulatory Visit: Payer: Self-pay

## 2019-09-15 VITALS — BP 135/86 | HR 112 | Temp 98.9°F | Ht 69.0 in | Wt 335.0 lb

## 2019-09-15 DIAGNOSIS — G610 Guillain-Barre syndrome: Secondary | ICD-10-CM

## 2019-09-15 DIAGNOSIS — M1990 Unspecified osteoarthritis, unspecified site: Secondary | ICD-10-CM | POA: Insufficient documentation

## 2019-09-15 DIAGNOSIS — M159 Polyosteoarthritis, unspecified: Secondary | ICD-10-CM

## 2019-09-15 DIAGNOSIS — G4700 Insomnia, unspecified: Secondary | ICD-10-CM | POA: Diagnosis not present

## 2019-09-15 DIAGNOSIS — E785 Hyperlipidemia, unspecified: Secondary | ICD-10-CM

## 2019-09-15 DIAGNOSIS — E1169 Type 2 diabetes mellitus with other specified complication: Secondary | ICD-10-CM

## 2019-09-15 MED ORDER — MELOXICAM 15 MG PO TABS: 15.0000 mg | ORAL_TABLET | Freq: Every day | ORAL | 1 refills | Status: DC

## 2019-09-15 MED ORDER — TRAZODONE HCL 100 MG PO TABS: 100.0000 mg | ORAL_TABLET | Freq: Every day | ORAL | 1 refills | Status: DC

## 2019-09-15 MED ORDER — FENOFIBRATE 160 MG PO TABS: 160.0000 mg | ORAL_TABLET | Freq: Every day | ORAL | 1 refills | Status: DC

## 2019-09-15 MED FILL — FENOFIBRATE 160 MG TABLET: 160 | 90 days supply | Qty: 90 | Fill #0

## 2019-09-15 NOTE — Progress Notes (Signed)
I have discussed the procedure for the virtual visit with the patient who has given consent to proceed with assessment and treatment.   Rabia Argote L Clem Wisenbaker, CMA     

## 2019-09-15 NOTE — Progress Notes (Signed)
Virtual Visit via Video   I connected with patient on 09/15/19 at  1:00 PM EDT by a video enabled telemedicine application and verified that I am speaking with the correct person using two identifiers.  Location patient: Home Location provider: Acupuncturist, Office Persons participating in the virtual visit: Patient, Provider, [place] (Jess B)  I discussed the limitations of evaluation and management by telemedicine and the availability of in person appointments. The patient expressed understanding and agreed to proceed.  Subjective:   HPI:   Arthritis- saw Cody on 8/10 and was restarted on Meloxicam.  Pt reports joints are feeling 'a whole lot better' since restarting Meloxicam.  Has not yet tried Voltaren gel.  Sees Dr Tamala Julian on Thursday.  Guillain Barre- pt continues to get IVIG  Hyperlipidemia- pt's fenofibrate was d/c'd at hospital d/c.  He is concerned b/c his triglycerides increased in June.  Insomnia- pt is having a hard time sleeping despite taking trazodone nightly.  Asking if he can increase this.  ROS:   See pertinent positives and negatives per HPI.  Patient Active Problem List   Diagnosis Date Noted  . Neuropathy 06/24/2019  . 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 Abnormal MRI, cervical spine 06/02/2019  . Bilateral arm weakness 06/02/2019  . Gait abnormality 06/02/2019  . Guillain Barr syndrome ([place]) 04/19/2019  . Upper extremity weakness 04/18/2019  . Central cervical cord injury, without spinal bony injury, C1-4 ([place]) 04/16/2019  . 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 Degenerative arthritis of knee, bilateral 09/02/2018  . Pulmonary HTN ([place]) 08/14/2018  . Insomnia 07/30/2016  . Carpal tunnel syndrome, bilateral 07/30/2016  . Lumbar radiculopathy 07/30/2016  . Physical exam 06/13/2016  . Obstructive sleep apnea 06/21/2014  . Personal history of malignant neoplasm of prostate 06/21/2014  . Hyperlipidemia associated with type 2 diabetes mellitus  ([place]) 04/27/2014  . Morbid obesity ([place]) 06/02/2013  . Frequent unifocal PVCs   . Coronary artery disease   . Hypertension   . Diabetes ([place]) 10/27/2012    Social History   Tobacco Use  . Smoking status: Never Smoker  . Smokeless tobacco: Never Used  Substance Use Topics  . Alcohol use: No    Current Outpatient Medications:  .  acetaminophen (TYLENOL) 500 MG tablet, Take 1,000 mg by mouth every 6 (six) hours as needed for moderate pain., Disp: , Rfl:  .  albuterol (VENTOLIN HFA) 108 (90 Base) MCG/ACT inhaler, Inhale 2 puffs into the lungs every 6 (six) hours as needed for wheezing or shortness of breath., Disp: 18 g, Rfl: 3 .  amLODipine (NORVASC) 10 MG tablet, TAKE 1 TABLET BY MOUTH DAILY., Disp: 90 tablet, Rfl: 0 .  aspirin EC 81 MG tablet, Take 81 mg by mouth daily., Disp: , Rfl:  .  Cholecalciferol (VITAMIN D3 PO), Take 1 tablet by mouth daily., Disp: , Rfl:  .  Cyanocobalamin (VITAMIN B-12 PO), Take by mouth., Disp: , Rfl:  .  furosemide (LASIX) 20 MG tablet, Take 1-2 tablets (20-40 mg total) by mouth daily as needed for edema., Disp: , Rfl:  .  gabapentin (NEURONTIN) 300 MG capsule, TAKE 1 CAPSULE BY MOUTH 3 TIMES DAILY, Disp: 90 capsule, Rfl: 0 .  Iron-Vitamins (GERITOL PO), Take by mouth., Disp: , Rfl:  .  JANUVIA 100 MG tablet, TAKE 1 TABLET BY MOUTH DAILY., Disp: 30 tablet, Rfl: 1 .  lisinopril (ZESTRIL) 40 MG tablet, TAKE 1 TABLET BY MOUTH DAILY., Disp: 90 tablet, Rfl: 1 .  magnesium gluconate (MAGONATE)  500 MG tablet, Take 500 mg by mouth daily. , Disp: , Rfl:  .  meloxicam (MOBIC) 15 MG tablet, Take 1 tablet (15 mg total) by mouth daily., Disp: 30 tablet, Rfl: 0 .  metFORMIN (GLUCOPHAGE) 500 MG tablet, TAKE 2 TABLETS BY MOUTH 2 TIMES DAILY WITH A MEAL. (Patient taking differently: Take 1,000 mg by mouth 2 (two) times daily with a meal. ), Disp: 360 tablet, Rfl: 1 .  metoprolol succinate (TOPROL-XL) 100 MG 24 hr tablet, TAKE 1 TABLET (100 MG TOTAL) BY MOUTH DAILY., Disp: 90  tablet, Rfl: 1 .  Misc Natural Products (TART CHERRY ADVANCED PO), Take 1 tablet by mouth daily., Disp: , Rfl:  .  Potassium 95 MG TABS, Take 95 mg by mouth daily., Disp: , Rfl:  .  rosuvastatin (CRESTOR) 10 MG tablet, Take 10 mg by mouth daily., Disp: , Rfl:  .  traZODone (DESYREL) 50 MG tablet, TAKE 1/2 TO 1 TABLET BY MOUTH AT BEDTIME AS NEEDED FOR SLEEP, Disp: 90 tablet, Rfl: 0 .  TRUE METRIX BLOOD GLUCOSE TEST test strip, , Disp: , Rfl: 1 .  TRUEPLUS LANCETS 30G MISC, , Disp: , Rfl: 1 .  Turmeric Curcumin 500 MG CAPS, Take by mouth., Disp: , Rfl:  .  zinc gluconate 50 MG tablet, Take 50 mg by mouth daily., Disp: , Rfl:   Allergies  Allergen Reactions  . Crestor [Rosuvastatin]     Muscle Pain   . Lipitor [Atorvastatin] Other (See Comments)    Arm weakness     Objective:   BP 135/86   Pulse (!) 112   Temp 98.9 F (37.2 C) (Skin)   Ht 5\' 9"  (1.753 m)   Wt (!) 335 lb (152 kg)   BMI 49.47 kg/m   AAOx3, NAD NCAT, EOMI No obvious CN deficits Coloring WNL Pt is able to speak clearly, coherently without shortness of breath or increased work of breathing.  Thought process is linear.  Mood is appropriate.   Assessment and Plan:   See Problem Based Charting  [name], MD 09/15/2019

## 2019-09-15 NOTE — Assessment & Plan Note (Signed)
He is still receiving IVIG infusions w/ Dr Krista Blue.  Reviewed CDC guidelines and it appears that he can get the COVID vaccine despite his hx of Guillain Barre and his current tx w/ IVIG.  I did encourage him to discuss this w/ Neuro.

## 2019-09-15 NOTE — Assessment & Plan Note (Signed)
Chronic problem.  Pt was concerned that his trigs were elevated in June after stopping fenofibrate earlier this year.  He would like to restart.  Prescription sent.

## 2019-09-15 NOTE — Assessment & Plan Note (Signed)
Deteriorated.  Increase Trazodone to 100mg  nightly.  New prescription sent.

## 2019-09-15 NOTE — Assessment & Plan Note (Signed)
Improved since restarting Mobic.  Refills provided.

## 2019-09-16 NOTE — Progress Notes (Signed)
Travis [name] Phone: [phone] Subjective:   Travis [name], am serving as a scribe for Dr. Hulan [name]. This visit occurred during the SARS-CoV-2 public health emergency.  Safety protocols were in place, including screening questions prior to the visit, additional usage of staff PPE, and extensive cleaning of exam room while observing appropriate contact time as indicated for disinfecting solutions.   I'm seeing this patient by the request  of:  Travis Minium, MD  CC: Bilateral knee pain  NAT:[id number]   07/14/2019 Chronic problem with exacerbation.  Patient has near bone-on-bone osteoarthritic changes and is overweight that is contributing.  Discussed continuing gabapentin, icing regimen, home exercise, topical anti-inflammatories.  Patient is having difficulty even working secondary to patient size and pain and instability of the knees.  Patient is not a candidate for surgery at the moment.  Has not responded extremely well to viscosupplementation.  Follow-up with me again in 8 to 10 weeks.  Update 09/17/2019 Travis [name] is a 64 y.o. male coming in with complaint of bilateral knee pain. Patient states that injections do help. L>R.  Patient still relating with the aid of a walker, does not do much more than activities of daily living at this time.  Patient states that he is always with pain.  Continuing to have instability of the knees as well       Past Medical History:  Diagnosis Date  . Appendicitis   . Blood in stool   . Coronary artery disease 1997   s/p PCI by Dr. Glade [name]  . Diverticulosis of colon with hemorrhage 2009  . Dyslipidemia, goal LDL below 70   . Edema   . Encounter for long-term (current) use of other medications   . Essential hypertension, benign   . Family history of thyroid disease   . Frequent unifocal PVCs   . GBS (Guillain Barre syndrome) (Travis [name])   . Morbid obesity (Travis [name])   .  Myalgia and myositis, unspecified   . Prostate cancer ([place]) 2012  . Pulmonary HTN ([place]) 08/14/2018   PASP 30mmHg by echo 2017  . Sleep apnea with use of continuous positive airway pressure (CPAP)   . Thrombocytopenia ([place]) 05/25/2014  . Type II or unspecified type diabetes mellitus without mention of complication, not stated as uncontrolled    Past Surgical History:  Procedure Laterality Date  . ANGIOPLASTY  1997   Dr. Glade [name]  . APPENDECTOMY    . radioactive seed prostate     Social History   Socioeconomic History  . Marital status: Married    Spouse name: Not on file  . Number of children: 0  . Years of education: 93  . Highest education level: High school graduate  Occupational History  . Occupation: Scientist, research (medical): Travis [name]: mod/heavy lifting  Tobacco Use  . Smoking status: Never Smoker  . Smokeless tobacco: Never Used  Vaping Use  . Vaping Use: Never used  Substance and Sexual Activity  . Alcohol use: [name]  . Drug use: [name]  . Sexual activity: Not on file  Other Topics Concern  . Not on file  Social History Narrative   Lives at home with his wife.   Right-handed.   Caffeine: occasional use.   Social Determinants of Health   Financial Resource Strain:   . Difficulty of Paying Living Expenses: Not on file  Food Insecurity:   . Worried About  Running Out of Food in the Last Year: Not on file  . Ran Out of Food in the Last Year: Not on file  Transportation Needs:   . Lack of Transportation (Medical): Not on file  . Lack of Transportation (Non-Medical): Not on file  Physical Activity:   . Days of Exercise per Week: Not on file  . Minutes of Exercise per Session: Not on file  Stress:   . Feeling of Stress : Not on file  Social Connections:   . Frequency of Communication with Friends and Family: Not on file  . Frequency of Social Gatherings with Friends and Family: Not on file  . Attends Religious Services: Not on file  . Active  Member of Clubs or Organizations: Not on file  . Attends Archivist Meetings: Not on file  . Marital Status: Not on file   Allergies  Allergen Reactions  . Crestor [Rosuvastatin]     Muscle Pain   . Lipitor [Atorvastatin] Other (See Comments)    Arm weakness    Family History  Problem Relation Age of Onset  . CVA Father   . Hypertension Father   . Diabetes Mother   . Hypothyroidism Sister   . Hypertension Sister     Current Outpatient Medications (Endocrine & Metabolic):  [name]  JANUVIA 100 MG tablet, TAKE 1 TABLET BY MOUTH DAILY. .  metFORMIN (GLUCOPHAGE) 500 MG tablet, TAKE 2 TABLETS BY MOUTH 2 TIMES DAILY WITH A MEAL. (Patient taking differently: Take 1,000 mg by mouth 2 (two) times daily with a meal. )  Current Outpatient Medications (Cardiovascular):  .  amLODipine (NORVASC) 10 MG tablet, TAKE 1 TABLET BY MOUTH DAILY. .  fenofibrate 160 MG tablet, Take 1 tablet (160 mg total) by mouth daily. .  furosemide (LASIX) 20 MG tablet, Take 1-2 tablets (20-40 mg total) by mouth daily as needed for edema. [name]  lisinopril (ZESTRIL) 40 MG tablet, TAKE 1 TABLET BY MOUTH DAILY. .  metoprolol succinate (TOPROL-XL) 100 MG 24 hr tablet, TAKE 1 TABLET (100 MG TOTAL) BY MOUTH DAILY. .  rosuvastatin (CRESTOR) 10 MG tablet, Take 10 mg by mouth daily.  Current Outpatient Medications (Respiratory):  .  albuterol (VENTOLIN HFA) 108 (90 Base) MCG/ACT inhaler, Inhale 2 puffs into the lungs every 6 (six) hours as needed for wheezing or shortness of breath.  Current Outpatient Medications (Analgesics):  .  acetaminophen (TYLENOL) 500 MG tablet, Take 1,000 mg by mouth every 6 (six) hours as needed for moderate pain. [name]  aspirin EC 81 MG tablet, Take 81 mg by mouth daily. .  meloxicam (MOBIC) 15 MG tablet, Take 1 tablet (15 mg total) by mouth daily.  Current Outpatient Medications (Hematological):  [name]  Cyanocobalamin (VITAMIN B-12 PO), Take by mouth.  Current Outpatient Medications (Other):  [name]   Cholecalciferol (VITAMIN D3 PO), Take 1 tablet by mouth daily. [name]  gabapentin (NEURONTIN) 300 MG capsule, TAKE 1 CAPSULE BY MOUTH 3 TIMES DAILY .  Iron-Vitamins (GERITOL PO), Take by mouth. .  magnesium gluconate (MAGONATE) 500 MG tablet, Take 500 mg by mouth daily.  .  Misc Natural Products (TART CHERRY ADVANCED PO), Take 1 tablet by mouth daily. .  Potassium 95 MG TABS, Take 95 mg by mouth daily. .  traZODone (DESYREL) 100 MG tablet, Take 1 tablet (100 mg total) by mouth at bedtime. .  TRUE METRIX BLOOD GLUCOSE TEST test strip,  .  TRUEPLUS LANCETS 30G MISC,  .  Turmeric Curcumin 500 MG CAPS, Take by  mouth. .  zinc gluconate 50 MG tablet, Take 50 mg by mouth daily.   Reviewed prior external information including notes and imaging from  primary care provider As well as notes that were available from care everywhere and other healthcare systems.  Past medical history, social, surgical and family history all reviewed in electronic medical record.  [name] pertanent information unless stated regarding to the chief complaint.   Review of Systems:  [name] headache, visual changes, nausea, vomiting, diarrhea, constipation, dizziness, abdominal pain, skin rash, fevers, chills, night sweats, weight loss, swollen lymph nodes, body aches, joint swelling, chest pain, shortness of breath, mood changes. POSITIVE muscle aches  Objective  Blood pressure 120/82, pulse 84, height 5\' 9"  (1.753 m), weight (!) 344 lb (156 kg), SpO2 97 %.   General: [name] apparent distress alert and oriented x3 mood and affect normal, dressed appropriately.  Morbidly obese HEENT: Pupils equal, extraocular movements intact  Respiratory: Patient's speak in full sentences and does not appear short of breath  Cardiovascular: 2+ lower extremity edema, non tender, [name] erythema  Gait antalgic ambulating with the aid of a walker rolling walker MSK: Knee: Bilateral valgus deformity noted. Large thigh to calf ratio.  Tender to palpation over  medial and PF joint line.  ROM full in flexion and extension and lower leg rotation. instability with valgus force.  painful patellar compression. Patellar glide with moderate crepitus. Patellar and quadriceps tendons unremarkable. Hamstring and quadriceps strength is normal.  After informed written and verbal consent, patient was seated on exam table. Right knee was prepped with alcohol swab and utilizing anterolateral approach, patient's right knee space was injected with 4:1  marcaine 0.5%: Kenalog 40mg /dL. Patient tolerated the procedure well without immediate complications.  After informed written and verbal consent, patient was seated on exam table. Left knee was prepped with alcohol swab and utilizing anterolateral approach, patient's left knee space was injected with 4:1  marcaine 0.5%: Kenalog 40mg /dL. Patient tolerated the procedure well without immediate complications.      Impression and Recommendations:     The above documentation has been reviewed and is accurate and complete Lyndal Pulley, DO       Note: This dictation was prepared with Dragon dictation along with smaller phrase technology. Any transcriptional errors that result from this process are unintentional.

## 2019-09-17 ENCOUNTER — Other Ambulatory Visit: Payer: Self-pay

## 2019-09-17 ENCOUNTER — Ambulatory Visit (INDEPENDENT_AMBULATORY_CARE_PROVIDER_SITE_OTHER): Payer: 59 | Admitting: Family Medicine

## 2019-09-17 ENCOUNTER — Encounter: Payer: Self-pay | Admitting: Family Medicine

## 2019-09-17 DIAGNOSIS — M170 Bilateral primary osteoarthritis of knee: Secondary | ICD-10-CM | POA: Diagnosis not present

## 2019-09-17 NOTE — Patient Instructions (Signed)
Good to see you You know the drill See me again in 3 months

## 2019-09-18 ENCOUNTER — Encounter: Payer: Self-pay | Admitting: Family Medicine

## 2019-09-18 NOTE — Assessment & Plan Note (Signed)
Chronic problem with exacerbation.  Patient secondary to other comorbidities and social determinants of health are unable to do any type of surgical intervention at this time.  Patient is no longer working secondary to some of the pain as well.  Patient continues to have instability and encouraged more the bracing.  Patient did not respond extremely well to the viscosupplementation and seems to respond better to the steroid injections.  Can continue this every 10- 12 weeks if necessary and follow-up will be.

## 2019-10-07 DIAGNOSIS — G619 Inflammatory polyneuropathy, unspecified: Secondary | ICD-10-CM | POA: Diagnosis not present

## 2019-10-08 DIAGNOSIS — G619 Inflammatory polyneuropathy, unspecified: Secondary | ICD-10-CM | POA: Diagnosis not present

## 2019-10-13 ENCOUNTER — Other Ambulatory Visit: Payer: Self-pay | Admitting: Cardiology

## 2019-10-13 ENCOUNTER — Other Ambulatory Visit: Payer: Self-pay | Admitting: Family Medicine

## 2019-10-13 ENCOUNTER — Telehealth: Payer: Self-pay | Admitting: Family Medicine

## 2019-10-13 MED FILL — FUROSEMIDE 20 MG TABS: 20 | 15 days supply | Qty: 60 | Fill #0

## 2019-10-13 MED FILL — ROSUVASTATIN CALCIUM 10 MG: 10 | 90 days supply | Qty: 90 | Fill #0

## 2019-10-13 MED FILL — AMLODIPINE BESYLATE 10 MG T: 10 | 90 days supply | Qty: 90 | Fill #0

## 2019-10-13 MED FILL — MELOXICAM 15 MG TABLET: 15 | 90 days supply | Qty: 90 | Fill #0

## 2019-10-13 MED FILL — METOPROLOL SUCCINATE ER 100: 100 | 90 days supply | Qty: 90 | Fill #1

## 2019-10-13 MED FILL — traZODone HCL 100 MG TABS: 100 | 90 days supply | Qty: 90 | Fill #0

## 2019-10-13 MED FILL — GABAPENTIN 300 MG CAPSULE: 300 | 30 days supply | Qty: 90 | Fill #0

## 2019-10-13 MED FILL — JANUVIA 100 MG TABLET: 100 | 30 days supply | Qty: 30 | Fill #1

## 2019-10-13 MED FILL — METFORMIN HCL 500 MG TABS: 500 | 90 days supply | Qty: 360 | Fill #0

## 2019-10-13 NOTE — Telephone Encounter (Signed)
Paperwork will be given to PCP for review.  

## 2019-10-13 NOTE — Telephone Encounter (Signed)
Pt is asking to be excused from [facility] duty, I have placed a form upfront that should be completed and he also needs a letter from Dr. Birdie Riddle stating why he shouldn't serve. Pt can be reached at the home #   Pt would like to pick this up at his appt with Dr. Birdie Riddle on 10/19/19

## 2019-10-19 ENCOUNTER — Other Ambulatory Visit: Payer: Self-pay | Admitting: General Practice

## 2019-10-19 ENCOUNTER — Encounter: Payer: Self-pay | Admitting: Family Medicine

## 2019-10-19 ENCOUNTER — Ambulatory Visit (INDEPENDENT_AMBULATORY_CARE_PROVIDER_SITE_OTHER): Payer: 59 | Admitting: Family Medicine

## 2019-10-19 ENCOUNTER — Other Ambulatory Visit: Payer: Self-pay

## 2019-10-19 VITALS — BP 122/86 | HR 80 | Temp 97.9°F | Resp 17 | Ht 69.0 in | Wt 339.0 lb

## 2019-10-19 DIAGNOSIS — E119 Type 2 diabetes mellitus without complications: Secondary | ICD-10-CM | POA: Diagnosis not present

## 2019-10-19 LAB — BASIC METABOLIC PANEL
BUN: 21 mg/dL (ref 6–23)
CO2: 27 mEq/L (ref 19–32)
Calcium: 9.3 mg/dL (ref 8.4–10.5)
Chloride: 100 mEq/L (ref 96–112)
Creatinine, Ser: 0.85 mg/dL (ref 0.40–1.50)
GFR: 90.93 mL/min (ref >60.00)
Glucose, Bld: 162 mg/dL — ABNORMAL HIGH (ref 70–99)
Potassium: 4.5 mEq/L (ref 3.5–5.1)
Sodium: 137 mEq/L (ref 135–145)

## 2019-10-19 LAB — HEMOGLOBIN A1C: Hgb A1c MFr Bld: 7 % — ABNORMAL HIGH (ref 4.6–6.5)

## 2019-10-19 MED ORDER — TRUEPLUS LANCETS 30G MISC: 3 refills | Status: DC

## 2019-10-19 MED ORDER — FREESTYLE LANCETS MISC: 12 refills | Status: DC

## 2019-10-19 MED ORDER — TRUE METRIX BLOOD GLUCOSE TEST VI STRP: ORAL_STRIP | 3 refills | Status: DC

## 2019-10-19 MED ORDER — FREESTYLE LITE TEST VI STRP: ORAL_STRIP | 3 refills | Status: DC

## 2019-10-19 MED ORDER — TRUE METRIX METER W/DEVICE KIT: PACK | 3 refills | Status: DC

## 2019-10-19 MED ORDER — FREESTYLE LITE DEVI: 3 refills | Status: DC

## 2019-10-19 MED FILL — FREESTYLE LANCETS: 50 days supply | Qty: 100 | Fill #0

## 2019-10-19 MED FILL — FREESTYLE LITE METER: W/DEVICE | 1 days supply | Qty: 1 | Fill #0

## 2019-10-19 MED FILL — FREESTYLE LITE TEST STRIP: 75 days supply | Qty: 150 | Fill #0

## 2019-10-19 NOTE — Patient Instructions (Addendum)
Schedule your complete physical in 3-4 months We'll notify you of your lab results and make any changes if needed Continue to work on healthy diet and regular exercise- you're down 5 lbs! We'll call you with your podiatry appt Ask Dr Krista Blue about the safety of the flu shot and COVID vaccines Call with any questions or concerns Stay Safe!  Stay Healthy!

## 2019-10-19 NOTE — Progress Notes (Signed)
   Subjective:    Patient ID: Travis [name], male    DOB: 1955-03-04, 64 y.o.   MRN: [id number]  HPI Diabetes- chronic problem, on Januvia 100mg  daily, on Metformin 1000mg  BID.  Last A1C 6.  UTD on eye exam, due for foot exam.  On ACE for renal protection.  Down 5 lbs since last visit.  Denies CP, SOB above baseline, HAs, visual changes, abd pain, N/V.  Denies symptomatic lows.  Some tingling of hands- 'i'm getting some feeling back'.  No sores/blisters/wounds of feet.   Review of Systems For ROS see HPI   This visit occurred during the SARS-CoV-2 public health emergency.  Safety protocols were in place, including screening questions prior to the visit, additional usage of staff PPE, and extensive cleaning of exam room while observing appropriate contact time as indicated for disinfecting solutions.       Objective:   Physical Exam Vitals reviewed.  Constitutional:      General: He is not in acute distress.    Appearance: He is well-developed. He is obese.  HENT:     Head: Normocephalic and atraumatic.  Eyes:     Conjunctiva/sclera: Conjunctivae normal.     Pupils: Pupils are equal, round, and reactive to light.  Neck:     Thyroid: No thyromegaly.  Cardiovascular:     Rate and Rhythm: Normal rate and regular rhythm.     Heart sounds: Normal heart sounds. No murmur heard.   Pulmonary:     Effort: Pulmonary effort is normal. No respiratory distress.     Breath sounds: Normal breath sounds.  Abdominal:     General: Bowel sounds are normal. There is no distension.     Palpations: Abdomen is soft.  Musculoskeletal:     Cervical back: Normal range of motion and neck supple.  Lymphadenopathy:     Cervical: No cervical adenopathy.  Skin:    General: Skin is warm and dry.  Neurological:     Mental Status: He is alert and oriented to person, place, and time.     Cranial Nerves: No cranial nerve deficit.  Psychiatric:        Behavior: Behavior normal.           Assessment &  Plan:

## 2019-10-19 NOTE — Assessment & Plan Note (Signed)
Chronic problem.  Hx of good control using Januvia 100mg  daily and Metformin 1000mg  BID.  UTD on eye exam.  Foot exam done today but will refer to podiatry for ongoing nail care.  UTD on eye exam, on ACE for renal protection.  Applauded his 5 lb weight loss.  Check labs.  Adjust meds prn

## 2019-10-19 NOTE — Telephone Encounter (Signed)
Form given to pt during OV today

## 2019-10-20 ENCOUNTER — Encounter: Payer: Self-pay | Admitting: General Practice

## 2019-11-03 DIAGNOSIS — G619 Inflammatory polyneuropathy, unspecified: Secondary | ICD-10-CM | POA: Diagnosis not present

## 2019-11-04 DIAGNOSIS — G619 Inflammatory polyneuropathy, unspecified: Secondary | ICD-10-CM | POA: Diagnosis not present

## 2019-11-11 ENCOUNTER — Other Ambulatory Visit: Payer: Self-pay | Admitting: Family Medicine

## 2019-11-11 ENCOUNTER — Ambulatory Visit: Payer: 59 | Admitting: Podiatry

## 2019-11-11 ENCOUNTER — Other Ambulatory Visit: Payer: Self-pay

## 2019-11-11 DIAGNOSIS — M79675 Pain in left toe(s): Secondary | ICD-10-CM

## 2019-11-11 DIAGNOSIS — M79674 Pain in right toe(s): Secondary | ICD-10-CM

## 2019-11-11 DIAGNOSIS — B351 Tinea unguium: Secondary | ICD-10-CM | POA: Diagnosis not present

## 2019-11-11 DIAGNOSIS — E119 Type 2 diabetes mellitus without complications: Secondary | ICD-10-CM | POA: Diagnosis not present

## 2019-11-11 MED FILL — GABAPENTIN 300 MG CAPSULE: 300 | 30 days supply | Qty: 90 | Fill #0

## 2019-11-11 MED FILL — JANUVIA 100 MG TABLET: 100 | 30 days supply | Qty: 30 | Fill #0

## 2019-11-11 NOTE — Telephone Encounter (Signed)
Travis [name] [id number]  Januvia 100 mg  30tabs with 1 refill  Last refill 10/13/2019

## 2019-11-11 NOTE — Telephone Encounter (Signed)
Travis [name] [id number]  Gabepentin 300 mg  Take 1 3xdaily  90 caps 0 refills  Last refills 10/13/2019

## 2019-11-12 ENCOUNTER — Encounter: Payer: Self-pay | Admitting: Podiatry

## 2019-11-12 NOTE — Progress Notes (Signed)
  Subjective:  Patient ID: Travis [name], male    DOB: 18-Dec-1955,  MRN: [id number]  Chief Complaint  Patient presents with  . routine foot care    diabetic foot and nail care    64 y.o. male returns for the above complaint.  Patient presents with thickened elongated dystrophic toenails x10.  Patient is a diabetic with last A1c of 7.0.  Patient states that they are gone really long he is unable to debride himself.  He would like for [place] to debride them down.  There is pain to touch.  He denies any other acute complaints.  Objective:  There were no vitals filed for this visit. Podiatric Exam: Vascular: dorsalis pedis and posterior tibial pulses are palpable bilateral. Capillary return is immediate. Temperature gradient is WNL. Skin turgor WNL  Sensorium: Normal Semmes Weinstein monofilament test. Normal tactile sensation bilaterally. Nail Exam: Pt has thick disfigured discolored nails with subungual debris noted bilateral entire nail hallux through fifth toenails.  Pain on palpation to the nails. Ulcer Exam: There is no evidence of ulcer or pre-ulcerative changes or infection. Orthopedic Exam: Muscle tone and strength are WNL. No limitations in general ROM. No crepitus or effusions noted. HAV  B/L.  Hammer toes 2-5  B/L. Skin: No Porokeratosis. No infection or ulcers    Assessment & Plan:   1. Type 2 diabetes mellitus without complication, without long-term current use of insulin (HCC)   2. Pain due to onychomycosis of toenails of both feet     Patient was evaluated and treated and all questions answered.  Onychomycosis with pain  -Nails palliatively debrided as below. -Educated on self-care  Procedure: Nail Debridement Rationale: pain  Type of Debridement: manual, sharp debridement. Instrumentation: Nail nipper, rotary burr. Number of Nails: 10  Procedures and Treatment: Consent by patient was obtained for treatment procedures. The patient understood the discussion of treatment  and procedures well. All questions were answered thoroughly reviewed. Debridement of mycotic and hypertrophic toenails, 1 through 5 bilateral and clearing of subungual debris. No ulceration, no infection noted.  Return Visit-Office Procedure: Patient instructed to return to the office for a follow up visit 3 months for continued evaluation and treatment.  Boneta Lucks, DPM    No follow-ups on file.

## 2019-11-26 ENCOUNTER — Telehealth: Payer: Self-pay | Admitting: *Deleted

## 2019-11-26 NOTE — Telephone Encounter (Signed)
Give him a follow-up visit with me before his insurance ran out

## 2019-11-26 NOTE — Telephone Encounter (Signed)
I spoke to the patient. He lost his job on 10/16/2019. He was suppose to be able to keep his insurance through the end of the year and possibly through 2022. He found out today that he no longer has coverage. He worked for [employer] for 45 years and they could not accommodate the physical issues being caused by Guillain-Barre.  I provided him with information for [place] programs and he is going to explore that option today. He is also going to try to work with his former employer on keeping his insurance benefits a little longer.   [facility] cancelled his IVIG treatements. He is unsure what to do about his condition.

## 2019-11-30 ENCOUNTER — Ambulatory Visit: Payer: 59 | Admitting: Cardiovascular Disease

## 2019-12-03 NOTE — Telephone Encounter (Signed)
Pt returned phone call from Dr. Krista Blue.  Pt ask to call him on the home phone.[phone]

## 2019-12-03 NOTE — Telephone Encounter (Signed)
I called, left message to patient at both numbers listed. Please contact patient again for me to talk with him

## 2019-12-07 NOTE — Telephone Encounter (Signed)
I called him,   He got his first IVIG in July 2021, August, Sept, Oct, he reported mild to moderate improvement, lessening paresthesia, may even mild stronger  But unfortunately he lost his job in [employer], could not continue IVIG, not a good candidate for steroid due to diabetes, lack of access to medical care for right now,  He ambulates ok,  will contact office once his health insurance is available

## 2019-12-14 ENCOUNTER — Other Ambulatory Visit: Payer: Self-pay | Admitting: Family Medicine

## 2019-12-14 MED ORDER — LISINOPRIL 40 MG PO TABS
40.0000 mg | ORAL_TABLET | Freq: Every day | ORAL | 1 refills | Status: DC
Start: 2019-12-14 — End: 2020-01-20

## 2019-12-14 NOTE — Progress Notes (Signed)
Prescription filled at pt's request ?

## 2019-12-17 ENCOUNTER — Ambulatory Visit: Payer: 59 | Admitting: Family Medicine

## 2020-01-05 DIAGNOSIS — Z0289 Encounter for other administrative examinations: Secondary | ICD-10-CM

## 2020-01-19 ENCOUNTER — Telehealth: Payer: Self-pay | Admitting: Family Medicine

## 2020-01-19 NOTE — Telephone Encounter (Signed)
..  Medication Refills  Medication:  All medications - Patient now has insurance and needs these sent in.  Pharmacy:  [facility] - [address], [place] ** Let patient know to contact pharmacy at the end of the day to make sure medication is ready.**  ** Please notify patient to allow 48-72 hours to process.**  ** Encourage patient to contact the pharmacy for refills or they can request refills through [facility]**  Clinical Fills out below:   Last refill:  QTY:  Refill Date:    Other Comments:  Patient now has insurance and needs his medications called in.  He has an appointment coming up in February.  Okay for refill?  Please advise.

## 2020-01-19 NOTE — Telephone Encounter (Signed)
Do you approve all medications being filled for this patient?

## 2020-01-20 ENCOUNTER — Other Ambulatory Visit: Payer: Self-pay

## 2020-01-20 DIAGNOSIS — M159 Polyosteoarthritis, unspecified: Secondary | ICD-10-CM

## 2020-01-20 MED ORDER — AMLODIPINE BESYLATE 10 MG PO TABS: 10.0000 mg | ORAL_TABLET | Freq: Every day | ORAL | 0 refills | Status: DC

## 2020-01-20 MED ORDER — FENOFIBRATE 160 MG PO TABS: 160.0000 mg | ORAL_TABLET | Freq: Every day | ORAL | 1 refills | Status: DC

## 2020-01-20 MED ORDER — SITAGLIPTIN PHOSPHATE 100 MG PO TABS: 100.0000 mg | ORAL_TABLET | Freq: Every day | ORAL | 0 refills | Status: DC

## 2020-01-20 MED ORDER — GABAPENTIN 300 MG PO CAPS: 300.0000 mg | ORAL_CAPSULE | Freq: Three times a day (TID) | ORAL | 0 refills | Status: DC

## 2020-01-20 MED ORDER — LISINOPRIL 40 MG PO TABS: 40.0000 mg | ORAL_TABLET | Freq: Every day | ORAL | 1 refills | Status: DC

## 2020-01-20 MED ORDER — FREESTYLE LITE DEVI: 3 refills | Status: DC

## 2020-01-20 MED ORDER — MELOXICAM 15 MG PO TABS: 15.0000 mg | ORAL_TABLET | Freq: Every day | ORAL | 1 refills | Status: DC

## 2020-01-20 MED ORDER — FUROSEMIDE 20 MG PO TABS: ORAL_TABLET | ORAL | 6 refills | Status: DC

## 2020-01-20 MED ORDER — FREESTYLE LANCETS MISC: 12 refills | Status: DC

## 2020-01-20 MED ORDER — ALBUTEROL SULFATE HFA 108 (90 BASE) MCG/ACT IN AERS: 2.0000 | INHALATION_SPRAY | Freq: Four times a day (QID) | RESPIRATORY_TRACT | 3 refills | Status: DC | PRN

## 2020-01-20 MED ORDER — TRAZODONE HCL 100 MG PO TABS: 100.0000 mg | ORAL_TABLET | Freq: Every day | ORAL | 1 refills | Status: DC

## 2020-01-20 MED ORDER — METFORMIN HCL 500 MG PO TABS: ORAL_TABLET | ORAL | 1 refills | Status: DC

## 2020-01-20 MED ORDER — FREESTYLE LITE TEST VI STRP: ORAL_STRIP | 3 refills | Status: DC

## 2020-01-20 NOTE — Telephone Encounter (Signed)
Ok to send prescription refills for the medications with my name on them

## 2020-01-20 NOTE — Telephone Encounter (Signed)
Medications sent to patient's pharmacy.

## 2020-01-21 ENCOUNTER — Other Ambulatory Visit: Payer: Self-pay

## 2020-01-21 ENCOUNTER — Encounter: Payer: Self-pay | Admitting: Family Medicine

## 2020-01-21 ENCOUNTER — Ambulatory Visit (INDEPENDENT_AMBULATORY_CARE_PROVIDER_SITE_OTHER): Payer: 59 | Admitting: Family Medicine

## 2020-01-21 DIAGNOSIS — M170 Bilateral primary osteoarthritis of knee: Secondary | ICD-10-CM | POA: Diagnosis not present

## 2020-01-21 NOTE — Patient Instructions (Addendum)
Injected both knees today See me again in 10-12 weeks  

## 2020-01-21 NOTE — Progress Notes (Signed)
[name] [address] [place] [address] Phone: [phone] Subjective:   Bruce Donath, am serving as a scribe for Dr. Antoine Primas. This visit occurred during the SARS-CoV-2 public health emergency.  Safety protocols were in place, including screening questions prior to the visit, additional usage of staff PPE, and extensive cleaning of exam room while observing appropriate contact time as indicated for disinfecting solutions.   I'm seeing this patient by the request  of:  Sheliah Hatch, MD  CC: bilateral knee pain   [id number]   09/17/2019 Chronic problem with exacerbation.  Patient secondary to other comorbidities and social determinants of health are unable to do any type of surgical intervention at this time.  Patient is no longer working secondary to some of the pain as well.  Patient continues to have instability and encouraged more the bracing.  Patient did not respond extremely well to the viscosupplementation and seems to respond better to the steroid injections.  Can continue this every 10- 12 weeks if necessary and follow-up will be.  Update 01/21/2020 AZARIA BARTELL is a 65 y.o. male coming in with complaint of bilateral knee pain. Patient states that he is having pain in both knees as he was going to come in December but did not have insurance.   Patient states that the pain can be fairly severe.  States that just feels like his bones are rubbing together.  Continues to use the aid of a rolling walker.   Patient's x-rays of the knee showed significant arthritic changes in 2020  Past Medical History:  Diagnosis Date  . Appendicitis   . Blood in stool   . Coronary artery disease 1997   s/p PCI by Dr. Aleen Campi  . Diverticulosis of colon with hemorrhage 2009  . Dyslipidemia, goal LDL below 70   . Edema   . Encounter for long-term (current) use of other medications   . Essential hypertension, benign   . Family history of  thyroid disease   . Frequent unifocal PVCs   . GBS (Guillain Barre syndrome) (HCC)   . Morbid obesity (HCC)   . Myalgia and myositis, unspecified   . Prostate cancer (HCC) 2012  . Pulmonary HTN (HCC) 08/14/2018   PASP by echo 2017  . Sleep apnea with use of continuous positive airway pressure (CPAP)   . Thrombocytopenia (HCC) 05/25/2014  . Type II or unspecified type diabetes mellitus without mention of complication, not stated as uncontrolled    Past Surgical History:  Procedure Laterality Date  . ANGIOPLASTY  1997   Dr. Aleen Campi  . APPENDECTOMY    . radioactive seed prostate     Social History   Socioeconomic History  . Marital status: Married    Spouse name: Not on file  . Number of children: 0  . Years of education: 5  . Highest education level: High school graduate  Occupational History  . Occupation: Building surveyor: [employer]    Comment: mod/heavy lifting  Tobacco Use  . Smoking status: Never Smoker  . Smokeless tobacco: Never Used  Vaping Use  . Vaping Use: Never used  Substance and Sexual Activity  . Alcohol use: No  . Drug use: No  . Sexual activity: Not on file  Other Topics Concern  . Not on file  Social History Narrative   Lives at home with his wife.   Right-handed.   Caffeine: occasional use.   Social Determinants  of Health   Financial Resource Strain: Not on file  Food Insecurity: Not on file  Transportation Needs: Not on file  Physical Activity: Not on file  Stress: Not on file  Social Connections: Not on file   Allergies  Allergen Reactions  . Crestor [Rosuvastatin]     Muscle Pain   . Lipitor [Atorvastatin] Other (See Comments)    Arm weakness    Family History  Problem Relation Age of Onset  . CVA Father   . Hypertension Father   . Diabetes Mother   . Hypothyroidism Sister   . Hypertension Sister     Current Outpatient Medications (Endocrine & Metabolic):  .  metFORMIN (GLUCOPHAGE) 500 MG tablet,  TAKE 2 TABLETS BY MOUTH 2 TIMES DAILY WITH A MEAL. [name]  sitaGLIPtin (JANUVIA) 100 MG tablet, Take 1 tablet (100 mg total) by mouth daily.  Current Outpatient Medications (Cardiovascular):  .  amLODipine (NORVASC) 10 MG tablet, Take 1 tablet (10 mg total) by mouth daily. .  fenofibrate 160 MG tablet, Take 1 tablet (160 mg total) by mouth daily. .  furosemide (LASIX) 20 MG tablet, TAKE 1 TO 2 TABLETS BY MOUTH TWICE A DAY AS NEEDED FOR EDEMA .  lisinopril (ZESTRIL) 40 MG tablet, Take 1 tablet (40 mg total) by mouth daily. .  metoprolol succinate (TOPROL-XL) 100 MG 24 hr tablet, TAKE 1 TABLET (100 MG TOTAL) BY MOUTH DAILY. .  rosuvastatin (CRESTOR) 10 MG tablet, TAKE 1 TABLET BY MOUTH DAILY. PLEASE KEEP UPCOMING APPT FOR FUTURE REFILLS. THANK YOU  Current Outpatient Medications (Respiratory):  .  albuterol (VENTOLIN HFA) 108 (90 Base) MCG/ACT inhaler, Inhale 2 puffs into the lungs every 6 (six) hours as needed for wheezing or shortness of breath.  Current Outpatient Medications (Analgesics):  .  acetaminophen (TYLENOL) 500 MG tablet, Take 1,000 mg by mouth every 6 (six) hours as needed for moderate pain. [name]  aspirin EC 81 MG tablet, Take 81 mg by mouth daily. .  meloxicam (MOBIC) 15 MG tablet, Take 1 tablet (15 mg total) by mouth daily.  Current Outpatient Medications (Hematological):  [name]  Cyanocobalamin (VITAMIN B-12 PO), Take 1,000 mcg by mouth.   Current Outpatient Medications (Other):  [name]  APPLE CIDER VINEGAR PO, Take 450 mg by mouth. .  Ascorbic Acid (VITAMIN C) 500 MG CAPS, Take by mouth. .  Blood Glucose Monitoring Suppl (FREESTYLE LITE) DEVI, Use to test sugars twice daily.Dx E11.9 .  Cholecalciferol (VITAMIN D3 PO), Take 2,000 Units by mouth daily.  .  Coconut Oil 1000 MG CAPS, Take by mouth. .  gabapentin (NEURONTIN) 300 MG capsule, Take 1 capsule (300 mg total) by mouth 3 (three) times daily. [name]  glucose blood (FREESTYLE LITE) test strip, Use as instructed to test sugars twice daily.  Dx. E11.9 .  Iron-Vitamins (GERITOL PO), Take by mouth. .  Lancets (FREESTYLE) lancets, Use as instructed to test sugars twice daily. Dx. E11.9 .  magnesium gluconate (MAGONATE) 500 MG tablet, Take 500 mg by mouth daily.  .  milk thistle 175 MG tablet, Take 175 mg by mouth daily. .  Misc Natural Products (TART CHERRY ADVANCED PO), Take 1,200 mg by mouth daily.  .  Omega-3 Fatty Acids (SALMON OIL-1000 PO), Take by mouth. .  Potassium 99 MG TABS, Take 99 mg by mouth. .  traZODone (DESYREL) 100 MG tablet, Take 1 tablet (100 mg total) by mouth at bedtime. .  Turmeric Curcumin 500 MG CAPS, Take by mouth. .  zinc gluconate 50 MG  tablet, Take 50 mg by mouth daily.   Reviewed prior external information including notes and imaging from  primary care provider As well as notes that were available from care everywhere and other healthcare systems.  Past medical history, social, surgical and family history all reviewed in electronic medical record.  No pertanent information unless stated regarding to the chief complaint.   Review of Systems:  No headache, visual changes, nausea, vomiting, diarrhea, constipation, dizziness, abdominal pain, skin rash, fevers, chills, night sweats, weight loss, swollen lymph nodeschest pain, shortness of breath, mood changes. POSITIVE muscle aches, body aches, joint swelling  Objective  Blood pressure 110/76, pulse 73, height 5\' 9"  (1.753 m), weight (!) 343 lb (155.6 kg), SpO2 96 %.   General: No apparent distress alert and oriented x3 mood and affect normal, dressed appropriately. Morbid obesity  HEENT: Pupils equal, extraocular movements intact  Respiratory: Patient's speak in full sentences and does not appear short of breath  Patient ambulates with the aid of a rolling walker Knee: Bilateral valgus deformity noted. Large thigh to calf ratio.  Tender to palpation over medial and PF joint line.  Decreased range of motion lacking last 5 degrees of extension and less  than 10 degrees of flexion. instability with valgus force.  painful patellar compression. Patellar glide with moderate crepitus. Patellar and quadriceps tendons unremarkable.  After informed written and verbal consent, patient was seated on exam table. Right knee was prepped with alcohol swab and utilizing anterolateral approach, patient's right knee space was injected with 4:1  marcaine 0.5%: Kenalog 40mg /dL. Patient tolerated the procedure well without immediate complications.  After informed written and verbal consent, patient was seated on exam table. Left knee was prepped with alcohol swab and utilizing anterolateral approach, patient's left knee space was injected with 4:1  marcaine 0.5%: Kenalog 40mg /dL. Patient tolerated the procedure well without immediate complications.   Impression and Recommendations:     The above documentation has been reviewed and is accurate and complete Lyndal Pulley, DO

## 2020-01-21 NOTE — Assessment & Plan Note (Signed)
Chronic problem with exacerbation.  Patient does have severe knee arthritis.  Has had many different comorbidities but the morbid obesity seems to be the most concerning.  We discussed referral to healthy weight and wellness or even to the bariatric clinic which patient declined.  Patient wants to continue with conservative therapy at this time as well as the injections.  We will get approval for viscosupplementation but patient did not respond to it previously follow-up with me again in 2 to 3 months patient does feel like if patient does get a response to the steroid injection still for least a finite amount of time

## 2020-01-24 NOTE — Progress Notes (Signed)
Cardiology Office Note:   Date:  01/28/2020  NAME:  Travis [name]    MRN: [id number] DOB:  Nov 02, 1955   PCP:  [name], MD  Cardiologist:  No primary care provider on file.   Referring MD: [name], MD   Chief Complaint  Patient presents with  . Follow-up    History of Present Illness:   Travis [name] is a 65 y.o. male with a hx of CAD s/p POBA, DM, HTN, HLD, PVCs who presents for follow-up. TG not at goal.  He reports he is doing fairly well.  Weights are stable around 336 pounds.  BMI 49.  He reports he was diagnosed with Guillain-Barr this past year.  He is now at work.  He is undergoing infusions.  He had issues with insurance but is now getting the infusions he needs.  Symptoms are slowly improving.  Regarding his heart health he describes infrequent episodes of palpitations.  They occur every 3 to 4 months.  Last less than 1 minute.  Described as rapid heartbeat sensation and irregular heartbeats.  Seems to be doing well on metoprolol.  We did go over the recent lipid profile which shows his triglycerides are elevated.  Apparently when he was in the hospital getting diagnosed with Guillain-Barr they stopped his fenofibrate.  He is back on this medicine.  Primary care physician plan is to repeat labs in February.  Blood pressure 138/72.  Denies any chest pain or shortness of breath today.  Volume status is acceptable.  Problem List 1. CAD -POBA 1997 2. HTN 3. DM -A1c 7.0 4. HLD -T chol 137, HDL 30, LDL 66, TG 331 5. PVCs  -brief 6. Guillain Barre Syndrome   Past Medical History: Past Medical History:  Diagnosis Date  . Appendicitis   . Blood in stool   . Coronary artery disease 1997   s/p PCI by Dr. Glade Lloyd  . Diverticulosis of colon with hemorrhage 2009  . Dyslipidemia, goal LDL below 70   . Edema   . Encounter for long-term (current) use of other medications   . Essential hypertension, benign   . Family history of thyroid disease   . Frequent  unifocal PVCs   . GBS (Guillain Barre syndrome) ([place])   . Morbid obesity ([place])   . Myalgia and myositis, unspecified   . Prostate cancer ([place]) 2012  . Pulmonary HTN ([place]) 08/14/2018   PASP 10mmHg by echo 2017  . Sleep apnea with use of continuous positive airway pressure (CPAP)   . Thrombocytopenia ([place]) 05/25/2014  . Type II or unspecified type diabetes mellitus without mention of complication, not stated as uncontrolled     Past Surgical History: Past Surgical History:  Procedure Laterality Date  . ANGIOPLASTY  1997   Dr. Glade Lloyd  . APPENDECTOMY    . radioactive seed prostate      Current Medications: Current Meds  Medication Sig  . acetaminophen (TYLENOL) 500 MG tablet Take 1,000 mg by mouth every 6 (six) hours as needed for moderate pain.  [name] albuterol (VENTOLIN HFA) 108 (90 Base) MCG/ACT inhaler Inhale 2 puffs into the lungs every 6 (six) hours as needed for wheezing or shortness of breath.  [name] amLODipine (NORVASC) 10 MG tablet Take 1 tablet (10 mg total) by mouth daily.  . APPLE CIDER VINEGAR PO Take 450 mg by mouth.  . Ascorbic Acid (VITAMIN C) 500 MG CAPS Take by mouth.  [name] aspirin EC 81 MG tablet Take 81 mg by  mouth daily.  . Blood Glucose Monitoring Suppl (FREESTYLE LITE) DEVI Use to test sugars twice daily.Dx E11.9  . Cholecalciferol (VITAMIN D3 PO) Take 2,000 Units by mouth daily.   . Coconut Oil 1000 MG CAPS Take by mouth.  . Cyanocobalamin (VITAMIN B-12 PO) Take 1,000 mcg by mouth.   . fenofibrate 160 MG tablet Take 1 tablet (160 mg total) by mouth daily.  . furosemide (LASIX) 20 MG tablet TAKE 1 TO 2 TABLETS BY MOUTH TWICE A DAY AS NEEDED FOR EDEMA  . gabapentin (NEURONTIN) 300 MG capsule Take 1 capsule (300 mg total) by mouth 3 (three) times daily.  [name] glucose blood (FREESTYLE LITE) test strip Use as instructed to test sugars twice daily. Dx. E11.9  . Iron-Vitamins (GERITOL PO) Take by mouth.  . Lancets (FREESTYLE) lancets Use as instructed to test sugars twice  daily. Dx. E11.9  . lisinopril (ZESTRIL) 40 MG tablet Take 1 tablet (40 mg total) by mouth daily.  . magnesium gluconate (MAGONATE) 500 MG tablet Take 500 mg by mouth daily.   . meloxicam (MOBIC) 15 MG tablet Take 1 tablet (15 mg total) by mouth daily.  . metFORMIN (GLUCOPHAGE) 500 MG tablet TAKE 2 TABLETS BY MOUTH 2 TIMES DAILY WITH A MEAL.  . milk thistle 175 MG tablet Take 175 mg by mouth daily.  . Misc Natural Products (TART CHERRY ADVANCED PO) Take 1,200 mg by mouth daily.   . Omega-3 Fatty Acids (SALMON OIL-1000 PO) Take by mouth.  . Potassium 99 MG TABS Take 99 mg by mouth.  . sitaGLIPtin (JANUVIA) 100 MG tablet Take 1 tablet (100 mg total) by mouth daily.  . traZODone (DESYREL) 100 MG tablet Take 1 tablet (100 mg total) by mouth at bedtime.  . Turmeric Curcumin 500 MG CAPS Take by mouth.  . zinc gluconate 50 MG tablet Take 50 mg by mouth daily.  . [DISCONTINUED] metoprolol succinate (TOPROL-XL) 100 MG 24 hr tablet TAKE 1 TABLET (100 MG TOTAL) BY MOUTH DAILY.  . [DISCONTINUED] rosuvastatin (CRESTOR) 10 MG tablet TAKE 1 TABLET BY MOUTH DAILY. PLEASE KEEP UPCOMING APPT FOR FUTURE REFILLS. THANK YOU     Allergies:    Crestor [rosuvastatin] and Lipitor [atorvastatin]   Social History: Social History   Socioeconomic History  . Marital status: Married    Spouse name: Not on file  . Number of children: 0  . Years of education: 12  . Highest education level: High school graduate  Occupational History  . 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 Smoking status: Never Smoker  . Smokeless tobacco: Never Used  Vaping Use  . Vaping Use: Never used  Substance and Sexual Activity  . Alcohol use: No  . Drug use: No  . Sexual activity: Not on file  Other Topics Concern  . Not on file  Social History Narrative   Lives at home with his wife.   Right-handed.   Caffeine: occasional use.   Social Determinants of Health    Financial Resource Strain: Not on file  Food Insecurity: Not on file  Transportation Needs: Not on file  Physical Activity: Not on file  Stress: Not on file  Social Connections: Not on file     Family History: The patient's family history includes CVA in his father; Diabetes in his mother; Hypertension in his father and sister; Hypothyroidism in his sister.  ROS:   All other ROS reviewed and negative. Pertinent  positives noted in the HPI.     EKGs/Labs/Other Studies Reviewed:   The following studies were personally reviewed by me today:  EKG:  EKG is ordered today.  The ekg ordered today demonstrates normal sinus rhythm heart rate 73, no acute ischemic changes, no evidence of prior infarction, and was personally reviewed by me.   TTE 08/25/2018 1. The left ventricle has normal systolic function with an ejection  fraction of 60-65%. The cavity size was normal. Left ventricular diastolic  Doppler parameters are consistent with pseudonormalization. Elevated left  ventricular end-diastolic pressure  No evidence of left ventricular regional wall motion abnormalities.  2. The right ventricle has normal systolic function. The cavity was  mildly enlarged. There is no increase in right ventricular wall thickness.  Right ventricular systolic pressure Cannot assess as IVC is not  visualized. There is at least moderate  pulmonary HTN as the TR pk gradient is 39mmHg..  3. Left atrial size was mildly dilated.  4. The aortic valve is tricuspid. Mild sclerosis of the aortic valve.  5. The aorta is normal in size and structure.   [facility] 03/29/2016  Normal sinus rhythm with average heart rate of 70bpm. Sinus tachycardia up to 130bpm.  Occasional PVCs    Recent Labs: 07/13/2019: ALT 37; Hemoglobin 14.3; Platelets 181.0; TSH 1.15 10/19/2019: BUN 21; Creatinine, Ser 0.85; Potassium 4.5; Sodium 137   Recent Lipid Panel    Component Value Date/Time   CHOL 137 07/13/2019 1443   CHOL 131  02/08/2016 0841   TRIG 331.0 (H) 07/13/2019 1443   HDL 29.50 (L) 07/13/2019 1443   HDL 32 (L) 02/08/2016 0841   CHOLHDL 5 07/13/2019 1443   VLDL 66.2 (H) 07/13/2019 1443   LDLCALC 69 12/15/2018 1352   LDLCALC 71 02/08/2016 0841   LDLDIRECT 66.0 07/13/2019 1443    Physical Exam:   VS:  BP 138/72   Pulse 73   Ht 5\' 9"  (1.753 m)   Wt (!) 336 lb 9.6 oz (152.7 kg)   BMI 49.71 kg/m    Wt Readings from Last 3 Encounters:  01/28/20 (!) 336 lb 9.6 oz (152.7 kg)  01/21/20 (!) 343 lb (155.6 kg)  10/19/19 (!) 339 lb (153.8 kg)    General: Well nourished, well developed, in no acute distress Head: Atraumatic, normal size  Eyes: PEERLA, EOMI  Neck: Supple, no JVD Endocrine: No thryomegaly Cardiac: Normal S1, S2; RRR; no murmurs, rubs, or gallops Lungs: Clear to auscultation bilaterally, no wheezing, rhonchi or rales  Abd: Soft, nontender, no hepatomegaly  Ext: No edema, pulses 2+ Musculoskeletal: No deformities, BUE and BLE strength normal and equal Skin: Warm and dry, no rashes   Neuro: Alert and oriented to person, place, time, and situation, CNII-XII grossly intact, no focal deficits  Psych: Normal mood and affect   ASSESSMENT:   Travis [name] is a 65 y.o. male who presents for the following: 1. Coronary artery disease involving native coronary artery of native heart without angina pectoris   2. Mixed hyperlipidemia   3. PVC (premature ventricular contraction)   4. Obesity, morbid, BMI 40.0-49.9 ([place])     PLAN:   1. Coronary artery disease involving native coronary artery of native heart without angina pectoris 2. Mixed hyperlipidemia -History of balloon angioplasty in the 90s.  No symptoms of chest pain or angina.  Recent echo normal. -Continue aspirin 81 mg a day.  He is on a statin Crestor 10 mg a day refill today.  Most recent LDL at  goal.  Triglycerides elevated in the 300 range on last visit.  Back on fenofibrate.  Needs to have a recheck in February by his primary care  physician.  I want him to be fasting for this.  He will send [place] the results.  His goal triglycerides are less than 150.  3. PVC (premature ventricular contraction) -Infrequent symptoms.  Continue metoprolol succinate 100 mg a day.  4. Obesity, morbid, BMI 40.0-49.9 (HCC) -Weight loss and exercise recommended.  Difficult right now given diagnosis of Guillain-Barr.  Disposition: Return in about 1 year (around 01/27/2021).  Medication Adjustments/Labs and Tests Ordered: Current medicines are reviewed at length with the patient today.  Concerns regarding medicines are outlined above.  Orders Placed This Encounter  Procedures  . EKG 12-Lead   Meds ordered this encounter  Medications  . metoprolol succinate (TOPROL-XL) 100 MG 24 hr tablet    Sig: Take 1 tablet (100 mg total) by mouth daily.    Dispense:  90 tablet    Refill:  3  . rosuvastatin (CRESTOR) 10 MG tablet    Sig: TAKE 1 TABLET BY MOUTH DAILY. PLEASE KEEP UPCOMING APPT FOR FUTURE REFILLS. THANK YOU    Dispense:  90 tablet    Refill:  3    Patient Instructions  Medication Instructions:  The current medical regimen is effective;  continue present plan and medications.  *If you need a refill on your cardiac medications before your next appointment, please call your pharmacy*   Follow-Up: At [facility], you and your health needs are our priority.  As part of our continuing mission to provide you with exceptional heart care, we have created designated Provider Care Teams.  These Care Teams include your primary Cardiologist (physician) and Advanced Practice Providers (APPs -  Physician Assistants and Nurse Practitioners) who all work together to provide you with the care you need, when you need it.  We recommend signing up for the patient portal called "MyChart".  Sign up information is provided on this After Visit Summary.  MyChart is used to connect with patients for Virtual Visits (Telemedicine).  Patients are able to view  lab/test results, encounter notes, upcoming appointments, etc.  Non-urgent messages can be sent to your provider as well.   To learn more about what you can do with MyChart, go to [email or web address].    Your next appointment:   12 month(s)  The format for your next appointment:   In Person  Provider:   Eleonore Chiquito, MD   Other Instructions Please send [place] a message when you get blood work completed by your primary care provider.  Thank you!      Time Spent with Patient: I have spent a total of 25 minutes with patient reviewing hospital notes, telemetry, EKGs, labs and examining the patient as well as establishing an assessment and plan that was discussed with the patient.  > 50% of time was spent in direct patient care.  Signed, Addison Naegeli. Audie Box, [place]  [address], [place], [place] [address] [phone]  01/28/2020 8:20 AM

## 2020-01-28 ENCOUNTER — Ambulatory Visit (INDEPENDENT_AMBULATORY_CARE_PROVIDER_SITE_OTHER): Payer: 59 | Admitting: Cardiovascular Disease

## 2020-01-28 ENCOUNTER — Encounter: Payer: Self-pay | Admitting: Cardiovascular Disease

## 2020-01-28 ENCOUNTER — Other Ambulatory Visit: Payer: Self-pay

## 2020-01-28 ENCOUNTER — Other Ambulatory Visit: Payer: Self-pay | Admitting: Cardiovascular Disease

## 2020-01-28 VITALS — BP 138/72 | HR 73 | Ht 69.0 in | Wt 336.6 lb

## 2020-01-28 DIAGNOSIS — I493 Ventricular premature depolarization: Secondary | ICD-10-CM | POA: Diagnosis not present

## 2020-01-28 DIAGNOSIS — E782 Mixed hyperlipidemia: Secondary | ICD-10-CM | POA: Diagnosis not present

## 2020-01-28 DIAGNOSIS — I2510 Atherosclerotic heart disease of native coronary artery without angina pectoris: Secondary | ICD-10-CM | POA: Diagnosis not present

## 2020-01-28 MED ORDER — ROSUVASTATIN CALCIUM 10 MG PO TABS: ORAL_TABLET | ORAL | 3 refills | Status: DC

## 2020-01-28 MED ORDER — METOPROLOL SUCCINATE ER 100 MG PO TB24: 100.0000 mg | ORAL_TABLET | Freq: Every day | ORAL | 3 refills | Status: DC

## 2020-01-28 MED FILL — ROSUVASTATIN CALCIUM 10 MG: 10 | 90 days supply | Qty: 90 | Fill #0

## 2020-01-28 MED FILL — METOPROLOL SUCCINATE ER 100: 100 | 90 days supply | Qty: 90 | Fill #0

## 2020-01-28 NOTE — Patient Instructions (Signed)
Medication Instructions:  The current medical regimen is effective;  continue present plan and medications.  *If you need a refill on your cardiac medications before your next appointment, please call your pharmacy*   Follow-Up: At [facility], you and your health needs are our priority.  As part of our continuing mission to provide you with exceptional heart care, we have created designated Provider Care Teams.  These Care Teams include your primary Cardiologist (physician) and Advanced Practice Providers (APPs -  Physician Assistants and Nurse Practitioners) who all work together to provide you with the care you need, when you need it.  We recommend signing up for the patient portal called "MyChart".  Sign up information is provided on this After Visit Summary.  MyChart is used to connect with patients for Virtual Visits (Telemedicine).  Patients are able to view lab/test results, encounter notes, upcoming appointments, etc.  Non-urgent messages can be sent to your provider as well.   To learn more about what you can do with MyChart, go to [email or web address].    Your next appointment:   12 month(s)  The format for your next appointment:   In Person  Provider:   Eleonore Chiquito, MD   Other Instructions Please send [place] a message when you get blood work completed by your primary care provider.  Thank you!

## 2020-02-15 ENCOUNTER — Other Ambulatory Visit: Payer: Self-pay | Admitting: Family Medicine

## 2020-02-24 ENCOUNTER — Other Ambulatory Visit: Payer: Self-pay

## 2020-02-24 ENCOUNTER — Ambulatory Visit (INDEPENDENT_AMBULATORY_CARE_PROVIDER_SITE_OTHER): Payer: 59 | Admitting: Podiatry

## 2020-02-24 ENCOUNTER — Encounter: Payer: Self-pay | Admitting: Podiatry

## 2020-02-24 DIAGNOSIS — M79675 Pain in left toe(s): Secondary | ICD-10-CM

## 2020-02-24 DIAGNOSIS — E119 Type 2 diabetes mellitus without complications: Secondary | ICD-10-CM | POA: Diagnosis not present

## 2020-02-24 DIAGNOSIS — L853 Xerosis cutis: Secondary | ICD-10-CM | POA: Diagnosis not present

## 2020-02-24 DIAGNOSIS — B351 Tinea unguium: Secondary | ICD-10-CM

## 2020-02-24 NOTE — Progress Notes (Signed)
  Subjective:  Patient ID: Travis [name], male    DOB: 09-Apr-1955,  MRN: [id number]  Chief Complaint  Patient presents with  . routine foot care    Nail trim    65 y.o. male returns for the above complaint.  Patient presents with thickened elongated dystrophic toenails x10.  Patient is a diabetic with last A1c of 7.0.  Patient states that they are gone really long he is unable to debride himself.  He would like for [place] to debride them down.  There is pain to touch.  He mentions that he also would like to know if there is any treatment options available for dry skin to both lower extremity.  He has not been doing anything for it.  He denies any other acute complaints.  Objective:  There were no vitals filed for this visit. Podiatric Exam: Vascular: dorsalis pedis and posterior tibial pulses are palpable bilateral. Capillary return is immediate. Temperature gradient is WNL. Skin turgor WNL  Sensorium: Normal Semmes Weinstein monofilament test. Normal tactile sensation bilaterally. Nail Exam: Pt has thick disfigured discolored nails with subungual debris noted bilateral entire nail hallux through fifth toenails.  Pain on palpation to the nails. Ulcer Exam: There is no evidence of ulcer or pre-ulcerative changes or infection. Orthopedic Exam: Muscle tone and strength are WNL. No limitations in general ROM. No crepitus or effusions noted. HAV  B/L.  Hammer toes 2-5  B/L. Skin: No Porokeratosis. No infection or ulcers.  Dry skin noted to bilateral lower extremity no subjective itching    Assessment & Plan:   1. Pain due to onychomycosis of toenails of both feet   2. Type 2 diabetes mellitus without complication, without long-term current use of insulin ([place])   3. Xerosis of skin     Patient was evaluated and treated and all questions answered.  Xerosis bilateral lower extremity -I explained to the patient the etiology of xerosis and various treatment options were extensively discussed.  I  explained to the patient the importance of maintaining moisturization of the skin with application of over-the-counter lotion such as Eucerin or Luciderm.  I have asked the patient to apply this twice a day.  If unable to resolve patient will benefit from prescription lotion.   Onychomycosis with pain  -Nails palliatively debrided as below. -Educated on self-care  Procedure: Nail Debridement Rationale: pain  Type of Debridement: manual, sharp debridement. Instrumentation: Nail nipper, rotary burr. Number of Nails: 10  Procedures and Treatment: Consent by patient was obtained for treatment procedures. The patient understood the discussion of treatment and procedures well. All questions were answered thoroughly reviewed. Debridement of mycotic and hypertrophic toenails, 1 through 5 bilateral and clearing of subungual debris. No ulceration, no infection noted.  Return Visit-Office Procedure: Patient instructed to return to the office for a follow up visit 3 months for continued evaluation and treatment.  Boneta Lucks, DPM    No follow-ups on file.

## 2020-03-01 ENCOUNTER — Encounter: Payer: Self-pay | Admitting: Family Medicine

## 2020-03-01 ENCOUNTER — Ambulatory Visit (INDEPENDENT_AMBULATORY_CARE_PROVIDER_SITE_OTHER): Payer: 59 | Admitting: Family Medicine

## 2020-03-01 ENCOUNTER — Other Ambulatory Visit: Payer: Self-pay

## 2020-03-01 VITALS — BP 116/64 | HR 64 | Temp 98.1°F | Resp 19 | Ht 69.0 in | Wt 341.6 lb

## 2020-03-01 DIAGNOSIS — Z23 Encounter for immunization: Secondary | ICD-10-CM

## 2020-03-01 DIAGNOSIS — Z125 Encounter for screening for malignant neoplasm of prostate: Secondary | ICD-10-CM | POA: Diagnosis not present

## 2020-03-01 DIAGNOSIS — I10 Essential (primary) hypertension: Secondary | ICD-10-CM | POA: Diagnosis not present

## 2020-03-01 DIAGNOSIS — E785 Hyperlipidemia, unspecified: Secondary | ICD-10-CM | POA: Diagnosis not present

## 2020-03-01 DIAGNOSIS — E1169 Type 2 diabetes mellitus with other specified complication: Secondary | ICD-10-CM | POA: Diagnosis not present

## 2020-03-01 DIAGNOSIS — E119 Type 2 diabetes mellitus without complications: Secondary | ICD-10-CM | POA: Diagnosis not present

## 2020-03-01 LAB — BASIC METABOLIC PANEL
BUN: 13 mg/dL (ref 6–23)
CO2: 27 mEq/L (ref 19–32)
Calcium: 9.6 mg/dL (ref 8.4–10.5)
Chloride: 101 mEq/L (ref 96–112)
Creatinine, Ser: 0.86 mg/dL (ref 0.40–1.50)
GFR: 92 mL/min (ref >60.00)
Glucose, Bld: 122 mg/dL — ABNORMAL HIGH (ref 70–99)
Potassium: 4.3 mEq/L (ref 3.5–5.1)
Sodium: 138 mEq/L (ref 135–145)

## 2020-03-01 LAB — PSA: PSA: 0.01 ng/mL — ABNORMAL LOW (ref 0.10–4.00)

## 2020-03-01 LAB — CBC WITH DIFFERENTIAL/PLATELET
Basophils Absolute: 0 10*3/uL (ref 0.0–0.1)
Basophils Relative: 0.3 % (ref 0.0–3.0)
Eosinophils Absolute: 0 10*3/uL (ref 0.0–0.7)
Eosinophils Relative: 0 % (ref 0.0–5.0)
HCT: 40.4 % (ref 39.0–52.0)
Hemoglobin: 13.6 g/dL (ref 13.0–17.0)
Lymphocytes Relative: 12.2 % (ref 12.0–46.0)
Lymphs Abs: 1 10*3/uL (ref 0.7–4.0)
MCHC: 33.6 g/dL (ref 30.0–36.0)
MCV: 90.6 fl (ref 78.0–100.0)
Monocytes Absolute: 0.6 10*3/uL (ref 0.1–1.0)
Monocytes Relative: 7.3 % (ref 3.0–12.0)
Neutro Abs: 6.4 10*3/uL (ref 1.4–7.7)
Neutrophils Relative %: 80.2 % — ABNORMAL HIGH (ref 43.0–77.0)
Platelets: 182 10*3/uL (ref 150.0–400.0)
RBC: 4.46 Mil/uL (ref 4.22–5.81)
RDW: 14.4 % (ref 11.5–15.5)
WBC: 7.9 10*3/uL (ref 4.0–10.5)

## 2020-03-01 LAB — HEPATIC FUNCTION PANEL
ALT: 27 U/L (ref 0–53)
AST: 26 U/L (ref 0–37)
Albumin: 4.1 g/dL (ref 3.5–5.2)
Alkaline Phosphatase: 43 U/L (ref 39–117)
Bilirubin, Direct: 0.1 mg/dL (ref 0.0–0.3)
Total Bilirubin: 0.4 mg/dL (ref 0.2–1.2)
Total Protein: 7.1 g/dL (ref 6.0–8.3)

## 2020-03-01 LAB — LIPID PANEL
Cholesterol: 143 mg/dL (ref 0–200)
HDL: 31.8 mg/dL — ABNORMAL LOW (ref >39.00)
NonHDL: 111.49
Total CHOL/HDL Ratio: 5
Triglycerides: 287 mg/dL — ABNORMAL HIGH (ref 0.0–149.0)
VLDL: 57.4 mg/dL — ABNORMAL HIGH (ref 0.0–40.0)

## 2020-03-01 LAB — TSH: TSH: 1.37 u[IU]/mL (ref 0.35–4.50)

## 2020-03-01 LAB — LDL CHOLESTEROL, DIRECT: Direct LDL: 86 mg/dL

## 2020-03-01 LAB — HEMOGLOBIN A1C: Hgb A1c MFr Bld: 6.5 % (ref 4.6–6.5)

## 2020-03-01 NOTE — Assessment & Plan Note (Signed)
Deteriorated.  Pt has gained 5 lbs since last visit.  Again discussed need for healthy diet and activity as able.  Will continue to follow.

## 2020-03-01 NOTE — Patient Instructions (Signed)
Follow up in 3-4 months to recheck sugar We'll notify you of your lab results and make any changes if needed Continue to work on low carb diet and try and get regular exercise- you can do it! Have them send me a copy of your eye exam Call with any questions or concerns Stay Safe!  Stay Healthy!

## 2020-03-01 NOTE — Assessment & Plan Note (Signed)
Chronic problem.  Pt reports AM sugars will run high at times- up to 170s.  On Metformin 1000mg  BID and Januvia 100mg  daily.  Has eye exam scheduled for April.  UTD on foot exam and on ACE for renal protection.  Check labs.  Adjust meds prn

## 2020-03-01 NOTE — Progress Notes (Signed)
   Subjective:    Patient ID: Travis [name], male    DOB: 10/28/1955, 65 y.o.   MRN: [id number]  HPI DM- chronic problem, on Metformin 1000mg  BID and Januvia 100mg  daily.  On ACE for renal protection.  UTD on foot exam.  Due for eye exam- scheduled for April.  Pt has gained 5 lbs since last visit.  AM sugars 130-170s.  Denies symptomatic lows.  Denies numbness of feet.  HTN- chronic problem, on metoprolol XL 100mg  daily, Lisinopril 40mg  daily, Lasix 20mg  daily, Amlodipine 10mg  daily w/ good control.  No CP, SOB above baseline, HAs, visual changes.  Hyperlipidemia- chronic problem, on Fenofibrate 160mg  daily, Crestor 10mg  daily.  Denies abd pain, N/V  Prostate cancer screen- due for repeat PSA   Review of Systems For ROS see HPI   This visit occurred during the SARS-CoV-2 public health emergency.  Safety protocols were in place, including screening questions prior to the visit, additional usage of staff PPE, and extensive cleaning of exam room while observing appropriate contact time as indicated for disinfecting solutions.       Objective:   Physical Exam Vitals reviewed.  Constitutional:      General: He is not in acute distress.    Appearance: He is well-developed and well-nourished. He is obese.  HENT:     Head: Normocephalic and atraumatic.  Eyes:     Extraocular Movements: EOM normal.     Conjunctiva/sclera: Conjunctivae normal.     Pupils: Pupils are equal, round, and reactive to light.  Neck:     Thyroid: No thyromegaly.  Cardiovascular:     Rate and Rhythm: Normal rate and regular rhythm.     Pulses: Normal pulses and intact distal pulses.     Heart sounds: Normal heart sounds. No murmur heard.   Pulmonary:     Effort: Pulmonary effort is normal. No respiratory distress.     Breath sounds: Normal breath sounds.  Abdominal:     General: Bowel sounds are normal. There is no distension.     Palpations: Abdomen is soft.  Musculoskeletal:        General: No edema.      Cervical back: Normal range of motion and neck supple.     Right lower leg: No edema.     Left lower leg: No edema.  Lymphadenopathy:     Cervical: No cervical adenopathy.  Skin:    General: Skin is warm and dry.  Neurological:     Mental Status: He is alert and oriented to person, place, and time.     Cranial Nerves: No cranial nerve deficit.  Psychiatric:        Mood and Affect: Mood and affect and mood normal.        Behavior: Behavior normal.           Assessment & Plan:

## 2020-03-01 NOTE — Assessment & Plan Note (Signed)
Chronic problem. On Fenofibrate and Crestor w/o difficulty.  Again encouraged healthy diet and regular activity.  Check labs.  Adjust meds prn

## 2020-03-01 NOTE — Assessment & Plan Note (Signed)
Chronic problem.  Well controlled on Metoprolol XL 100mg  daily, Lisinopril 40mg  daily, Lasix 20mg  daily, Amlodipine 10mg  daily.  Currently asymptomatic.  Check labs.  No anticipated med changes.  Will follow.

## 2020-03-12 ENCOUNTER — Other Ambulatory Visit: Payer: Self-pay | Admitting: Family Medicine

## 2020-03-30 NOTE — Progress Notes (Signed)
[place] Phone: [phone] Subjective:   Travis [name], am serving as a scribe for Dr. Hulan Saas. This visit occurred during the SARS-CoV-2 public health emergency.  Safety protocols were in place, including screening questions prior to the visit, additional usage of staff PPE, and extensive cleaning of exam room while observing appropriate contact time as indicated for disinfecting solutions.   I'm seeing this patient by the request  of:  [name], MD  CC: Bilateral knee pain  [id number]   01/21/2020 Chronic problem with exacerbation.  Patient does have severe knee arthritis.  Has had many different comorbidities but the morbid obesity seems to be the most concerning.  We discussed referral to healthy weight and wellness or even to the bariatric clinic which patient declined.  Patient wants to continue with conservative therapy at this time as well as the injections.  We will get approval for viscosupplementation but patient did not respond to it previously follow-up with me again in 2 to 3 months patient does feel like if patient does get a response to the steroid injection still for least a finite amount of time  Update 03/31/2020 Travis [name] is a 65 y.o. male coming in with complaint of B knee pain. L>R. Pain has increased over past 2 weeks.  Patient states starting to affect daily living.  Needing more of his Rollator on a regular basis.  Patient denies of any increase in instability but does feel that the right knee sometimes does feel odd .     Past Medical History:  Diagnosis Date  . Appendicitis   . Blood in stool   . Coronary artery disease 1997   s/p PCI by Dr. Glade Lloyd  . Diverticulosis of colon with hemorrhage 2009  . Dyslipidemia, goal LDL below 70   . Edema   . Encounter for long-term (current) use of other medications   . Essential hypertension, benign   . Family history of  thyroid disease   . Frequent unifocal PVCs   . GBS (Guillain Barre syndrome) ([place])   . Morbid obesity ([place])   . Myalgia and myositis, unspecified   . Prostate cancer ([place]) 2012  . Pulmonary HTN ([place]) 08/14/2018   PASP 53mmHg by echo 2017  . Sleep apnea with use of continuous positive airway pressure (CPAP)   . Thrombocytopenia ([place]) 05/25/2014  . Type II or unspecified type diabetes mellitus without mention of complication, not stated as uncontrolled    Past Surgical History:  Procedure Laterality Date  . ANGIOPLASTY  1997   Dr. Glade Lloyd  . APPENDECTOMY    . radioactive seed prostate     Social History   Socioeconomic History  . Marital status: Married    Spouse name: Not on file  . Number of children: 0  . Years of education: 47  . Highest education level: High school graduate  Occupational History  . 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 Smoking status: Never Smoker  . Smokeless tobacco: Never Used  Vaping Use  . Vaping Use: Never used  Substance and Sexual Activity  . Alcohol use: [name]  . Drug use: [name]  . Sexual activity: Not on file  Other Topics Concern  . Not on file  Social History Narrative   Lives at home with his wife.   Right-handed.   Caffeine: occasional use.  Social Determinants of Health   Financial Resource Strain: Not on file  Food Insecurity: Not on file  Transportation Needs: Not on file  Physical Activity: Not on file  Stress: Not on file  Social Connections: Not on file   Allergies  Allergen Reactions  . Crestor [Rosuvastatin]     Muscle Pain   . Lipitor [Atorvastatin] Other (See Comments)    Arm weakness    Family History  Problem Relation Age of Onset  . CVA Father   . Hypertension Father   . Diabetes Mother   . Hypothyroidism Sister   . Hypertension Sister     Current Outpatient Medications (Endocrine & Metabolic):  .  metFORMIN (GLUCOPHAGE) 500 MG tablet,  TAKE 2 TABLETS BY MOUTH 2 TIMES DAILY WITH A MEAL. [name]  sitaGLIPtin (JANUVIA) 100 MG tablet, Take 1 tablet (100 mg total) by mouth daily.  Current Outpatient Medications (Cardiovascular):  .  amLODipine (NORVASC) 10 MG tablet, Take 1 tablet (10 mg total) by mouth daily. .  fenofibrate 160 MG tablet, Take 1 tablet (160 mg total) by mouth daily. .  furosemide (LASIX) 20 MG tablet, TAKE 1 TO 2 TABLETS BY MOUTH TWICE A DAY AS NEEDED FOR EDEMA .  lisinopril (ZESTRIL) 40 MG tablet, TAKE 1 TABLET BY MOUTH EVERY DAY .  metoprolol succinate (TOPROL-XL) 100 MG 24 hr tablet, Take 1 tablet (100 mg total) by mouth daily. .  rosuvastatin (CRESTOR) 10 MG tablet, TAKE 1 TABLET BY MOUTH DAILY. PLEASE KEEP UPCOMING APPT FOR FUTURE REFILLS. THANK YOU  Current Outpatient Medications (Respiratory):  .  albuterol (VENTOLIN HFA) 108 (90 Base) MCG/ACT inhaler, Inhale 2 puffs into the lungs every 6 (six) hours as needed for wheezing or shortness of breath.  Current Outpatient Medications (Analgesics):  .  acetaminophen (TYLENOL) 500 MG tablet, Take 1,000 mg by mouth every 6 (six) hours as needed for moderate pain. Taking 650 mg arthritis BID .  aspirin EC 81 MG tablet, Take 81 mg by mouth daily. .  meloxicam (MOBIC) 15 MG tablet, Take 1 tablet (15 mg total) by mouth daily.  Current Outpatient Medications (Hematological):  [name]  Cyanocobalamin (VITAMIN B-12 PO), Take 1,000 mcg by mouth.   Current Outpatient Medications (Other):  [name]  APPLE CIDER VINEGAR PO, Take 450 mg by mouth. .  Ascorbic Acid (VITAMIN C) 500 MG CAPS, Take by mouth. .  Blood Glucose Monitoring Suppl (FREESTYLE LITE) DEVI, Use to test sugars twice daily.Dx E11.9 .  Cholecalciferol (VITAMIN D3 PO), Take 2,000 Units by mouth daily.  .  Coconut Oil 1000 MG CAPS, Take by mouth. .  gabapentin (NEURONTIN) 300 MG capsule, TAKE 1 CAPSULE BY MOUTH THREE TIMES A DAY .  glucose blood (FREESTYLE LITE) test strip, Use as instructed to test sugars twice daily. Dx.  E11.9 .  Iron-Vitamins (GERITOL PO), Take by mouth. .  Lancets (FREESTYLE) lancets, Use as instructed to test sugars twice daily. Dx. E11.9 .  magnesium gluconate (MAGONATE) 500 MG tablet, Take 500 mg by mouth daily.  .  milk thistle 175 MG tablet, Take 175 mg by mouth daily. .  Misc Natural Products (TART CHERRY ADVANCED PO), Take 1,200 mg by mouth daily.  .  Omega-3 Fatty Acids (SALMON OIL-1000 PO), Take by mouth. .  Potassium 99 MG TABS, Take 99 mg by mouth. .  traZODone (DESYREL) 100 MG tablet, Take 1 tablet (100 mg total) by mouth at bedtime. .  Turmeric Curcumin 500 MG CAPS, Take by mouth. .  zinc gluconate  50 MG tablet, Take 50 mg by mouth daily.   Reviewed prior external information including notes and imaging from  primary care provider As well as notes that were available from care everywhere and other healthcare systems.  Past medical history, social, surgical and family history all reviewed in electronic medical record.  [name] pertanent information unless stated regarding to the chief complaint.   Review of Systems:  [name] headache, visual changes, nausea, vomiting, diarrhea, constipation, dizziness, abdominal pain, skin rash, fevers, chills, night sweats, weight loss, swollen lymph nodes,chest pain, shortness of breath, mood changes. POSITIVE muscle aches, body aches and joint swelling  Objective  Blood pressure 140/80, pulse 76, height 5\' 9"  (1.753 m), weight (!) 346 lb (156.9 kg), SpO2 97 %.   General: [name] apparent distress alert and oriented x3 mood and affect normal, dressed appropriately.  Morbidly obese HEENT: Pupils equal, extraocular movements intact  Respiratory: Patient's speak in full sentences and does not appear short of breath  Cardiovascular: 1+ lower extremity edema, non tender, [name] erythema  Gait antalgic gait walking with the aid of a rolling walker MSK: Patient's knees bilaterally difficult to assess secondary to body habitus.  Patient does have limited range of  motion lacking the last 5 degrees of extension in the last 10 degrees of flexion.  Mild tenderness to palpation on the right knee than the left.  Instability with both knees with valgus and varus force.  Neurovascularly intact distally but does have some edema of the lower extremities.  After informed written and verbal consent, patient was seated on exam table. Right knee was prepped with alcohol swab and utilizing anterolateral approach, patient's right knee space was injected with 2:1  marcaine 0.5%: Kenalog 40mg /dL. Patient tolerated the procedure well without immediate complications.  After informed written and verbal consent, patient was seated on exam table. Left knee was prepped with alcohol swab and utilizing anterolateral approach, patient's left knee space was injected with 2:1  marcaine 0.5%: Kenalog 40mg /dL. Patient tolerated the procedure well without immediate complications.   Impression and Recommendations:     The above documentation has been reviewed and is accurate and complete Travis Pulley, DO

## 2020-03-31 ENCOUNTER — Other Ambulatory Visit: Payer: Self-pay

## 2020-03-31 ENCOUNTER — Ambulatory Visit (INDEPENDENT_AMBULATORY_CARE_PROVIDER_SITE_OTHER): Payer: 59 | Admitting: Family Medicine

## 2020-03-31 ENCOUNTER — Encounter: Payer: Self-pay | Admitting: Family Medicine

## 2020-03-31 DIAGNOSIS — M170 Bilateral primary osteoarthritis of knee: Secondary | ICD-10-CM | POA: Diagnosis not present

## 2020-03-31 NOTE — Patient Instructions (Signed)
Good to see you Injected both knee today See me in 3 months

## 2020-03-31 NOTE — Assessment & Plan Note (Signed)
Patient given steroid injections again today.  Chronic problem with exacerbation.  Encourage patient continue to work on weight.  Patient's most recent labs though are giving [place] a sense of optimism.  We did discuss again about healthy weight and wellness or even bariatric surgery.  Patient wants to continue with current plan.  Discussed with patient that we can repeat injections every 10 to 12 weeks if necessary.  Follow-up again in 3 months

## 2020-04-11 ENCOUNTER — Other Ambulatory Visit: Payer: Self-pay | Admitting: Family Medicine

## 2020-04-12 ENCOUNTER — Other Ambulatory Visit: Payer: Self-pay | Admitting: Family Medicine

## 2020-04-20 ENCOUNTER — Encounter: Payer: Self-pay | Admitting: Internal Medicine

## 2020-04-25 ENCOUNTER — Other Ambulatory Visit (HOSPITAL_COMMUNITY): Payer: Self-pay

## 2020-04-25 MED FILL — Metoprolol Succinate Tab ER 24HR 100 MG (Tartrate Equiv): ORAL | 90 days supply | Qty: 90 | Fill #0 | Status: AC

## 2020-04-25 MED FILL — Rosuvastatin Calcium Tab 10 MG: ORAL | 90 days supply | Qty: 90 | Fill #0 | Status: AC

## 2020-04-26 ENCOUNTER — Other Ambulatory Visit (HOSPITAL_COMMUNITY): Payer: Self-pay

## 2020-04-26 MED ORDER — PREDNISOLONE ACETATE 1 % OP SUSP
1.0000 [drp] | Freq: Four times a day (QID) | OPHTHALMIC | 0 refills | Status: DC
  Filled 2020-04-26: qty 10, 50d supply, fill #0

## 2020-04-26 MED ORDER — MOXIFLOXACIN HCL 0.5 % OP SOLN
1.0000 [drp] | Freq: Four times a day (QID) | OPHTHALMIC | 1 refills | Status: DC
  Filled 2020-04-26: qty 3, 15d supply, fill #0
  Filled 2020-05-24: qty 3, 15d supply, fill #1

## 2020-04-26 MED ORDER — KETOROLAC TROMETHAMINE 0.5 % OP SOLN
1.0000 [drp] | Freq: Four times a day (QID) | OPHTHALMIC | 1 refills | Status: DC
  Filled 2020-04-26: qty 5, 25d supply, fill #0
  Filled 2020-05-24: qty 5, 25d supply, fill #1

## 2020-05-03 ENCOUNTER — Other Ambulatory Visit: Payer: Self-pay | Admitting: Family Medicine

## 2020-05-18 HISTORY — PX: CATARACT EXTRACTION: SUR2

## 2020-05-24 ENCOUNTER — Other Ambulatory Visit (HOSPITAL_COMMUNITY): Payer: Self-pay

## 2020-05-25 ENCOUNTER — Other Ambulatory Visit: Payer: Self-pay

## 2020-05-25 ENCOUNTER — Ambulatory Visit (INDEPENDENT_AMBULATORY_CARE_PROVIDER_SITE_OTHER): Payer: 59 | Admitting: Podiatry

## 2020-05-25 ENCOUNTER — Encounter: Payer: Self-pay | Admitting: Podiatry

## 2020-05-25 DIAGNOSIS — E119 Type 2 diabetes mellitus without complications: Secondary | ICD-10-CM | POA: Diagnosis not present

## 2020-05-25 DIAGNOSIS — M79675 Pain in left toe(s): Secondary | ICD-10-CM | POA: Diagnosis not present

## 2020-05-25 DIAGNOSIS — M79674 Pain in right toe(s): Secondary | ICD-10-CM

## 2020-05-25 DIAGNOSIS — B351 Tinea unguium: Secondary | ICD-10-CM

## 2020-05-26 ENCOUNTER — Encounter: Payer: Self-pay | Admitting: Podiatry

## 2020-05-26 NOTE — Progress Notes (Signed)
  Subjective:  Patient ID: Travis [name], male    DOB: [birth year],  MRN: [id number]  Chief Complaint  Patient presents with  . Nail Problem    Nail trim    65 y.o. male returns for the above complaint.  Patient presents with thickened elongated dystrophic toenails x10.  Patient is a diabetic with last A1c of 7.0.  Patient states that they are gone really long he is unable to debride himself.  He would like for [place] to debride them down.  There is pain to touch.  He does not have any other complaints  Objective:  There were no vitals filed for this visit. Podiatric Exam: Vascular: dorsalis pedis and posterior tibial pulses are palpable bilateral. Capillary return is immediate. Temperature gradient is WNL. Skin turgor WNL  Sensorium: Normal Semmes Weinstein monofilament test. Normal tactile sensation bilaterally. Nail Exam: Pt has thick disfigured discolored nails with subungual debris noted bilateral entire nail hallux through fifth toenails.  Pain on palpation to the nails. Ulcer Exam: There is no evidence of ulcer or pre-ulcerative changes or infection. Orthopedic Exam: Muscle tone and strength are WNL. No limitations in general ROM. No crepitus or effusions noted. HAV  B/L.  Hammer toes 2-5  B/L. Skin: No Porokeratosis. No infection or ulcers.  Dry skin noted to bilateral lower extremity no subjective itching    Assessment & Plan:   1. Type 2 diabetes mellitus without complication, without long-term current use of insulin ([facility])   2. Pain due to onychomycosis of toenails of both feet     Patient was evaluated and treated and all questions answered.  Xerosis bilateral lower extremity -Clinically resolving  Onychomycosis with pain  -Nails palliatively debrided as below. -Educated on self-care  Procedure: Nail Debridement Rationale: pain  Type of Debridement: manual, sharp debridement. Instrumentation: Nail nipper, rotary burr. Number of Nails: 10  Procedures and Treatment:  Consent by patient was obtained for treatment procedures. The patient understood the discussion of treatment and procedures well. All questions were answered thoroughly reviewed. Debridement of mycotic and hypertrophic toenails, 1 through 5 bilateral and clearing of subungual debris. No ulceration, no infection noted.  Return Visit-Office Procedure: Patient instructed to return to the office for a follow up visit 3 months for continued evaluation and treatment.  Boneta Lucks, DPM    Return in about 3 months (around 08/25/2020) for [facility].

## 2020-05-30 ENCOUNTER — Other Ambulatory Visit (HOSPITAL_COMMUNITY): Payer: Self-pay

## 2020-05-30 MED ORDER — MOXIFLOXACIN HCL 0.5 % OP SOLN
1.0000 [drp] | Freq: Four times a day (QID) | OPHTHALMIC | 0 refills | Status: DC
Start: 2020-05-26 — End: 2020-09-28
  Filled 2020-05-30: qty 3, 15d supply, fill #0

## 2020-05-30 MED ORDER — PREDNISOLONE ACETATE 1 % OP SUSP
1.0000 [drp] | Freq: Four times a day (QID) | OPHTHALMIC | 0 refills | Status: DC
  Filled 2020-05-30 (×2): qty 10, 50d supply, fill #0

## 2020-05-30 MED ORDER — KETOROLAC TROMETHAMINE 0.5 % OP SOLN
1.0000 [drp] | Freq: Four times a day (QID) | OPHTHALMIC | 0 refills | Status: DC
  Filled 2020-05-30: qty 5, 5d supply, fill #0

## 2020-05-30 MED ORDER — BESIFLOXACIN HCL 0.6 % OP SUSP
1.0000 [drp] | Freq: Four times a day (QID) | OPHTHALMIC | 0 refills | Status: DC
  Filled 2020-05-30: qty 5, 25d supply, fill #0

## 2020-06-02 ENCOUNTER — Encounter: Payer: Self-pay | Admitting: Family Medicine

## 2020-06-02 ENCOUNTER — Ambulatory Visit (INDEPENDENT_AMBULATORY_CARE_PROVIDER_SITE_OTHER): Payer: 59 | Admitting: Family Medicine

## 2020-06-02 ENCOUNTER — Other Ambulatory Visit: Payer: Self-pay

## 2020-06-02 ENCOUNTER — Other Ambulatory Visit (HOSPITAL_COMMUNITY): Payer: Self-pay

## 2020-06-02 VITALS — BP 120/70 | HR 64 | Temp 98.8°F | Resp 17 | Ht 69.0 in | Wt 341.0 lb

## 2020-06-02 DIAGNOSIS — E119 Type 2 diabetes mellitus without complications: Secondary | ICD-10-CM | POA: Diagnosis not present

## 2020-06-02 DIAGNOSIS — M159 Polyosteoarthritis, unspecified: Secondary | ICD-10-CM | POA: Diagnosis not present

## 2020-06-02 DIAGNOSIS — H269 Unspecified cataract: Secondary | ICD-10-CM | POA: Diagnosis not present

## 2020-06-02 LAB — BASIC METABOLIC PANEL
BUN: 19 mg/dL (ref 6–23)
CO2: 28 mEq/L (ref 19–32)
Calcium: 9.6 mg/dL (ref 8.4–10.5)
Chloride: 101 mEq/L (ref 96–112)
Creatinine, Ser: 0.81 mg/dL (ref 0.40–1.50)
GFR: 93.52 mL/min (ref >60.00)
Glucose, Bld: 153 mg/dL — ABNORMAL HIGH (ref 70–99)
Potassium: 4.3 mEq/L (ref 3.5–5.1)
Sodium: 137 mEq/L (ref 135–145)

## 2020-06-02 LAB — HEMOGLOBIN A1C: Hgb A1c MFr Bld: 7.3 % — ABNORMAL HIGH (ref 4.6–6.5)

## 2020-06-02 MED ORDER — CELECOXIB 200 MG PO CAPS
200.0000 mg | ORAL_CAPSULE | Freq: Two times a day (BID) | ORAL | 3 refills | Status: DC
  Filled 2020-06-02: qty 60, 30d supply, fill #0
  Filled 2020-06-28: qty 60, 30d supply, fill #1
  Filled 2020-07-26: qty 60, 30d supply, fill #2
  Filled 2020-08-25: qty 60, 30d supply, fill #3

## 2020-06-02 NOTE — Assessment & Plan Note (Signed)
Chronic problem.  On Januvia and Metformin w/o difficulty.  UTD on eye exam, foot exam.  On ACE for renal protection.  Check labs.  Adjust meds prn

## 2020-06-02 NOTE — Assessment & Plan Note (Signed)
Deteriorated.  Pt is having breakthrough pain.  He reports he previously did well on Celebrex.  Will stop both Naproxen and Meloxicam and start Celebrex BID.  He is to take Tylenol in between for breakthrough pain.  Pt expressed understanding and is in agreement w/ plan.

## 2020-06-02 NOTE — Patient Instructions (Addendum)
Schedule your complete physical in 3-4 months We'll notify you of your lab results and make any changes if needed Continue to work on healthy diet and try and get regular physical activity- you can do it! STOP the Meloxicam and the Naproxen START the Celebrex twice daily- take w/ food Add Tylenol as needed for break through pain during the day Call with any questions or concerns Good luck on the cataract! HAPPY BIRTHDAY!!!

## 2020-06-02 NOTE — Progress Notes (Signed)
   Subjective:    Patient ID: Travis [name], male    DOB: 10-Jun-1955, 65 y.o.   MRN: [id number]  HPI DM- chronic problem, on Januvia 100mg  daily, Metformin 1000mg  BID.  On ACE for renal protection.  Eye exam done 04/22/20.  UTD on foot exam.  Home CBGs tend to run 150s.  Has occasional readings in the 200s.  Denies symptomatic lows.  No CP, SOB above baseline, abd pain, N/V.  Cataracts- pt had L eye done and he is now able to read out of L eye for the first time ever.  R eye surgery next week  Arthritis- pt is taking Naproxen twice daily and Meloxicam once daily.  He continues to have pain during the day.  Pt did well previously on Celebrex  Obesity- pt is down 5 lbs since last visit.  BMI now 50.36.  Pt is trying 'to take a few more walks'.  Has switched to A2 milk which causes less bloating   Review of Systems For ROS see HPI   This visit occurred during the SARS-CoV-2 public health emergency.  Safety protocols were in place, including screening questions prior to the visit, additional usage of staff PPE, and extensive cleaning of exam room while observing appropriate contact time as indicated for disinfecting solutions.       Objective:   Physical Exam Vitals reviewed.  Constitutional:      General: He is not in acute distress.    Appearance: Normal appearance. He is well-developed. He is obese.  HENT:     Head: Normocephalic and atraumatic.  Eyes:     Conjunctiva/sclera: Conjunctivae normal.     Pupils: Pupils are equal, round, and reactive to light.  Neck:     Thyroid: No thyromegaly.  Cardiovascular:     Rate and Rhythm: Normal rate and regular rhythm.     Pulses: Normal pulses.     Heart sounds: Normal heart sounds. No murmur heard.   Pulmonary:     Effort: Pulmonary effort is normal. No respiratory distress.     Breath sounds: Normal breath sounds.  Abdominal:     General: Bowel sounds are normal. There is no distension.     Palpations: Abdomen is soft.   Musculoskeletal:     Cervical back: Normal range of motion and neck supple.     Right lower leg: No edema.     Left lower leg: No edema.  Lymphadenopathy:     Cervical: No cervical adenopathy.  Skin:    General: Skin is warm and dry.  Neurological:     General: No focal deficit present.     Mental Status: He is alert and oriented to person, place, and time.     Cranial Nerves: No cranial nerve deficit.  Psychiatric:        Mood and Affect: Mood normal.        Behavior: Behavior normal.        Thought Content: Thought content normal.           Assessment & Plan:

## 2020-06-02 NOTE — Assessment & Plan Note (Signed)
Pt is down 5 lbs since last visit.  Encouraged continued work on [employer] and regular exercise.  Will follow.

## 2020-06-08 HISTORY — PX: CATARACT EXTRACTION: SUR2

## 2020-06-10 ENCOUNTER — Other Ambulatory Visit: Payer: Self-pay | Admitting: Family Medicine

## 2020-06-16 ENCOUNTER — Other Ambulatory Visit: Payer: Self-pay | Admitting: Family Medicine

## 2020-06-28 ENCOUNTER — Other Ambulatory Visit (HOSPITAL_COMMUNITY): Payer: Self-pay

## 2020-06-29 NOTE — Progress Notes (Signed)
[place] Phone: [phone] Subjective:   [name], am serving as a scribe for Dr. Hulan Saas. This visit occurred during the SARS-CoV-2 public health emergency.  Safety protocols were in place, including screening questions prior to the visit, additional usage of staff PPE, and extensive cleaning of exam room while observing appropriate contact time as indicated for disinfecting solutions.   I'm seeing this patient by the request  of:  [name], MD  CC: Bilateral knee pain  ZWC:[id number]  03/31/2020 Patient given steroid injections again today.  Chronic problem with exacerbation.  Encourage patient continue to work on weight.  Patient's most recent labs though are giving [place] a sense of optimism.  We did discuss again about healthy weight and wellness or even bariatric surgery.  Patient wants to continue with current plan.  Discussed with patient that we can repeat injections every 10 to 12 weeks if necessary.  Follow-up again in 3 months  Update 06/30/2020 DABNEY SCHANZ is a 65 y.o. male coming in with complaint of B knee pain.  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 States that his knees are stiff. Would like injections today. Using Celebrex.   B hip pain over glute medius. Patient sits on egg crate cushion for pain relief. Pain can be sharp at times.  Denies radiation down the legs.  Denies any weakness.      Past Medical History:  Diagnosis Date   Appendicitis    Blood in stool    Coronary artery disease 1997   s/p PCI by Dr. Glade Lloyd   Diverticulosis of colon with hemorrhage 2009   Dyslipidemia, goal LDL below 70    Edema    Encounter for long-term (current) use of other medications    Essential hypertension, benign    Family history of thyroid disease    Frequent unifocal PVCs    GBS (Guillain Barre syndrome) (HCC)    Morbid obesity (HCC)    Myalgia and myositis,  unspecified    Prostate cancer ([place]) 2012   Pulmonary HTN ([place]) 08/14/2018   PASP 81mHg by echo 2017   Sleep apnea with use of continuous positive airway pressure (CPAP)    Thrombocytopenia ([place] 05/25/2014   Type II or unspecified type diabetes mellitus without mention of complication, not stated as uncontrolled    Past Surgical History:  Procedure Laterality Date   ANGIOPLASTY  1997   Dr. 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   Right-handed.   Caffeine: occasional use.   Social Determinants of Health   Financial Resource Strain: Not on file  Food Insecurity: Not on file  Transportation Needs: Not on file  Physical Activity: Not on file  Stress: Not on file  Social Connections: Not on file   Allergies  Allergen Reactions   Crestor [Rosuvastatin]     Muscle Pain    Lipitor [Atorvastatin] Other (See Comments)    Arm weakness    Family History  Problem Relation Age of Onset   CVA Father    Hypertension Father    Diabetes Mother    Hypothyroidism Sister    Hypertension Sister     Current Outpatient Medications (Endocrine & Metabolic):    JANUVIA 275 MG tablet, TAKE 1 TABLET BY MOUTH EVERY DAY   metFORMIN (GLUCOPHAGE) 500 MG tablet, TAKE 2 TABLETS BY MOUTH 2 TIMES DAILY WITH A MEAL.  Current Outpatient Medications (Cardiovascular):    amLODipine  (NORVASC) 10 MG tablet, TAKE 1 TABLET BY MOUTH EVERY DAY   fenofibrate 160 MG tablet, Take 1 tablet (160 mg total) by mouth daily.   furosemide (LASIX) 20 MG tablet, TAKE 1 TO 2 TABLETS BY MOUTH TWICE A DAY AS NEEDED FOR EDEMA   lisinopril (ZESTRIL) 40 MG tablet, TAKE 1 TABLET BY MOUTH EVERY DAY   metoprolol succinate (TOPROL-XL) 100 MG 24 hr tablet, TAKE 1 TABLET (100 MG TOTAL) BY MOUTH DAILY.   rosuvastatin (CRESTOR) 10 MG tablet, TAKE 1 TABLET BY MOUTH DAILY. PLEASE KEEP UPCOMING APPT FOR FUTURE REFILLS. THANK YOU  Current Outpatient Medications (Respiratory):    albuterol (VENTOLIN HFA) 108 (90 Base) MCG/ACT inhaler, Inhale 2 puffs into the lungs every 6 (six) hours as needed for wheezing or shortness of breath.  Current Outpatient Medications (Analgesics):    acetaminophen (TYLENOL) 500 MG tablet, Take 1,000 mg by mouth every 6 (six) hours as needed for moderate pain. Taking 650 mg arthritis BID   aspirin EC 81 MG tablet, Take 81 mg by mouth daily.   celecoxib (CELEBREX) 200 MG capsule, Take 1 capsule (200 mg total) by mouth 2 (two) times daily.  Current Outpatient Medications (Hematological):    Cyanocobalamin (VITAMIN B-12 PO), Take 1,000 mcg by mouth.   Current Outpatient Medications (Other):    APPLE CIDER VINEGAR PO, Take 450 mg by mouth.   Ascorbic Acid (VITAMIN C) 500 MG CAPS, Take by mouth.   Blood Glucose Monitoring Suppl (FREESTYLE LITE) DEVI, Use to test sugars twice daily.Dx E11.9   Blood Glucose Monitoring Suppl (FREESTYLE LITE) w/Device KIT, USE TO TEST SUGARS TWICE DAILY   Cholecalciferol (VITAMIN D3 PO), Take 2,000 Units by mouth daily.    Coconut Oil 1000 MG CAPS, Take by mouth.   gabapentin (NEURONTIN) 300 MG capsule, TAKE 1 CAPSULE BY MOUTH THREE TIMES A DAY   glucose blood (FREESTYLE LITE) test strip, Use as instructed to test sugars twice daily. Dx. E11.9   Iron-Vitamins (GERITOL PO), Take by mouth.   ketorolac (ACULAR) 0.5 % ophthalmic solution, Place 1 drop  into the right eye 4 (four) times daily beginning after surgery   Lancets (FREESTYLE) lancets, Use as instructed to test sugars twice daily. Dx. E11.9   magnesium gluconate (MAGONATE) 500 MG tablet, Take 500 mg by mouth daily.    milk thistle 175 MG tablet, Take 175 mg by mouth daily.   Misc Natural Products (TART CHERRY ADVANCED PO), Take 1,200 mg by mouth daily.    moxifloxacin (VIGAMOX) 0.5 % ophthalmic solution, Place 1 drop into the right eye 4 (four) times daily. Start after surgery.   Omega-3 Fatty Acids (SALMON OIL-1000 PO), Take by mouth.   Potassium 99 MG TABS, Take 99 mg  by mouth.   prednisoLONE acetate (PRED FORTE) 1 % ophthalmic suspension, Place 1 drop into the operative eye 4 (four) times daily. Start after surgery.   traZODone (DESYREL) 100 MG tablet, Take 1 tablet (100 mg total) by mouth at bedtime.   Turmeric Curcumin 500 MG CAPS, Take by mouth.   zinc gluconate 50 MG tablet, Take 50 mg by mouth daily.   Reviewed prior external information including notes and imaging from  primary care provider As well as notes that were available from care everywhere and other healthcare systems.  Past medical history, social, surgical and family history all reviewed in electronic medical record.  No pertanent information unless stated regarding to the chief complaint.   Review of Systems:  No headache, visual changes, nausea, vomiting, diarrhea, constipation, dizziness, abdominal pain, skin rash, fevers, chills, night sweats, weight loss, swollen lymph nodes, joint swelling, chest pain, shortness of breath, mood changes. POSITIVE muscle aches, body aches  Objective  Blood pressure 110/78, pulse 76, height _0  (1.753 m), weight (!) 341 lb (154.7 kg), SpO2 97 %.   General: No apparent distress alert and oriented x3 mood and affect normal, dressed appropriately.  Morbidly obese HEENT: Pupils equal, extraocular movements intact  Respiratory: Patient's speak in full sentences and does not  appear short of breath  Cardiovascular: 1+ lower extremity edema, non tender, no erythema  Gait ambulates with the aid of a walker Knee: Bilateral valgus deformity noted. Large thigh to calf ratio.  Tender to palpation over medial and PF joint line.  ROM decreased range of motion secondary to patient's body habitus instability with valgus force.  painful patellar compression. Patellar glide with moderate crepitus. Patellar and quadriceps tendons unremarkable. Hamstring and quadriceps strength is normal.  Low back exam difficult to assess secondary to patient body habitus.  Tender to palpation over the paraspinal musculature of the lumbar spine left and right side.  No midline tenderness.  Negative straight leg test.  Unable to do [place] secondary to body habitus.  Neurovascularly intact distally.  After informed written and verbal consent, patient was seated on exam table. Right knee was prepped with alcohol swab and utilizing anterolateral approach, patient's right knee space was injected with 4:1  marcaine 0.5%: Kenalog 37m/dL. Patient tolerated the procedure well without immediate complications.  After informed written and verbal consent, patient was seated on exam table. Left knee was prepped with alcohol swab and utilizing anterolateral approach, patient's left knee space was injected with 4:1  marcaine 0.5%: Kenalog 459mdL. Patient tolerated the procedure well without immediate complications.    Impression and Recommendations:     The above documentation has been reviewed and is accurate and complete ZaLyndal PulleyDO

## 2020-06-30 ENCOUNTER — Other Ambulatory Visit: Payer: Self-pay

## 2020-06-30 ENCOUNTER — Ambulatory Visit (INDEPENDENT_AMBULATORY_CARE_PROVIDER_SITE_OTHER): Payer: 59

## 2020-06-30 ENCOUNTER — Ambulatory Visit (INDEPENDENT_AMBULATORY_CARE_PROVIDER_SITE_OTHER): Payer: 59 | Admitting: Family Medicine

## 2020-06-30 ENCOUNTER — Ambulatory Visit: Payer: 59

## 2020-06-30 ENCOUNTER — Encounter: Payer: Self-pay | Admitting: Family Medicine

## 2020-06-30 VITALS — BP 110/78 | HR 76 | Ht 69.0 in | Wt 341.0 lb

## 2020-06-30 DIAGNOSIS — M545 Low back pain, unspecified: Secondary | ICD-10-CM

## 2020-06-30 DIAGNOSIS — M5416 Radiculopathy, lumbar region: Secondary | ICD-10-CM

## 2020-06-30 DIAGNOSIS — M170 Bilateral primary osteoarthritis of knee: Secondary | ICD-10-CM | POA: Diagnosis not present

## 2020-06-30 NOTE — Assessment & Plan Note (Signed)
Encourage weight loss. 

## 2020-06-30 NOTE — Patient Instructions (Signed)
Xray today Exercises See me in 8-10 weeks

## 2020-06-30 NOTE — Assessment & Plan Note (Signed)
Patient is having worsening low back pain.  Likely no significant radiculopathy at the moment.  Discussed with patient about the possibility of range of motion exercises or formal physical therapy.  Patient is elected to start with a home exercises.  Work with Product/process development scientist today to alert him.  We will get x-rays to further evaluate from patient's previous 2017.  Follow-up with me again 2 to 3 months otherwise.

## 2020-06-30 NOTE — Assessment & Plan Note (Signed)
Patient given injections bilaterally today.  Discussed icing regimen and home exercises.  Once again encouraged weight loss.  Discussed avoiding certain activities, discussed icing regimen.  Patient has failed all other conservative therapy.  Follow-up with me again 2 to 3 months.

## 2020-07-13 ENCOUNTER — Encounter: Payer: Self-pay | Admitting: *Deleted

## 2020-07-15 ENCOUNTER — Other Ambulatory Visit: Payer: Self-pay | Admitting: Family Medicine

## 2020-07-15 DIAGNOSIS — M159 Polyosteoarthritis, unspecified: Secondary | ICD-10-CM

## 2020-07-15 NOTE — Telephone Encounter (Signed)
LFD 01/20/20 #90 with 1 refill LOV 06/02/20 NOV 10/06/20

## 2020-07-26 ENCOUNTER — Other Ambulatory Visit (HOSPITAL_COMMUNITY): Payer: Self-pay

## 2020-07-26 MED FILL — Rosuvastatin Calcium Tab 10 MG: ORAL | 90 days supply | Qty: 90 | Fill #1 | Status: AC

## 2020-07-26 MED FILL — Metoprolol Succinate Tab ER 24HR 100 MG (Tartrate Equiv): ORAL | 90 days supply | Qty: 90 | Fill #1 | Status: AC

## 2020-08-25 ENCOUNTER — Other Ambulatory Visit (HOSPITAL_COMMUNITY): Payer: Self-pay

## 2020-08-25 ENCOUNTER — Ambulatory Visit: Payer: 59 | Admitting: Family Medicine

## 2020-08-26 ENCOUNTER — Other Ambulatory Visit: Payer: Self-pay | Admitting: Family Medicine

## 2020-08-26 ENCOUNTER — Other Ambulatory Visit: Payer: Self-pay

## 2020-08-26 ENCOUNTER — Ambulatory Visit (INDEPENDENT_AMBULATORY_CARE_PROVIDER_SITE_OTHER): Payer: 59 | Admitting: Podiatry

## 2020-08-26 ENCOUNTER — Encounter: Payer: Self-pay | Admitting: Podiatry

## 2020-08-26 DIAGNOSIS — B351 Tinea unguium: Secondary | ICD-10-CM

## 2020-08-26 DIAGNOSIS — M79674 Pain in right toe(s): Secondary | ICD-10-CM

## 2020-08-26 DIAGNOSIS — M79675 Pain in left toe(s): Secondary | ICD-10-CM | POA: Diagnosis not present

## 2020-08-26 DIAGNOSIS — E119 Type 2 diabetes mellitus without complications: Secondary | ICD-10-CM

## 2020-08-26 NOTE — Progress Notes (Signed)
[place] Phone: [phone] Subjective:   [name], am serving as a scribe for Dr. Hulan Saas.  This visit occurred during the SARS-CoV-2 public health emergency.  Safety protocols were in place, including screening questions prior to the visit, additional usage of staff PPE, and extensive cleaning of exam room while observing appropriate contact time as indicated for disinfecting solutions.    I'm seeing this patient by the request  of:  [name], MD  CC: Bilateral knee and back pain  [id number]  06/30/2020 Patient is having worsening low back pain.  Likely no significant radiculopathy at the moment.  Discussed with patient about the possibility of range of motion exercises or formal physical therapy.  Patient is elected to start with a home exercises.  Work with Product/process development scientist today to alert him.  We will get x-rays to further evaluate from patient's previous 2017.  Follow-up with me again 2 to 3 months otherwise.  Patient given injections bilaterally today.  Discussed icing regimen and home exercises.  Once again encouraged weight loss.  Discussed avoiding certain activities, discussed icing regimen.  Patient has failed all other conservative therapy.  Follow-up with me again 2 to 3 months.  Update 08/30/2020 Travis [name] is a 65 y.o. male coming in with complaint of B knee and back pain. Patient states that he has been doing ok. Does feel like injections from last visit are wearing off.  Patient states some increasing instability again.  Feels like he favors his right leg all the time.  Feels of the left knee is worse with instability  Back has not been bothering him much since last visit.        Past Medical History:  Diagnosis Date   Appendicitis    Blood in stool    Coronary artery disease 1997   s/p PCI by Dr. Glade Lloyd   Diverticulosis of colon with hemorrhage 2009    Dyslipidemia, goal LDL below 70    Edema    Encounter for long-term (current) use of other medications    Essential hypertension, benign    Family history of thyroid disease    Frequent unifocal PVCs    GBS (Guillain Barre syndrome) (HCC)    Morbid obesity (HCC)    Myalgia and myositis, unspecified    Prostate cancer ([place]) 2012   Pulmonary HTN ([place]) 08/14/2018   PASP 87mHg by echo 2017   Sleep apnea with use of continuous positive airway pressure (CPAP)    [place] 05/25/2014   Type II or unspecified type diabetes mellitus without mention of complication, not stated as uncontrolled    Past Surgical History:  Procedure Laterality Date   ANGIOPLASTY  1997   Dr. 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   Right-handed.   Caffeine: occasional use.   Social Determinants of Health   Financial Resource Strain: Not on file  Food Insecurity: Not on file  Transportation Needs: Not on file  Physical Activity: Not on file  Stress: Not on file  Social Connections: Not on file   Allergies  Allergen Reactions   Crestor [Rosuvastatin]     Muscle Pain    Lipitor [Atorvastatin] Other (See Comments)    Arm weakness    Family History  Problem Relation Age of Onset   CVA Father    Hypertension Father    Diabetes Mother    Hypothyroidism Sister     Hypertension Sister     Current Outpatient Medications (Endocrine & Metabolic):    JANUVIA 681 MG tablet, TAKE 1 TABLET BY MOUTH EVERY DAY   metFORMIN (GLUCOPHAGE) 500 MG tablet, TAKE 2 TABLETS BY MOUTH 2 TIMES DAILY WITH A MEAL.  Current Outpatient Medications (Cardiovascular):    amLODipine (NORVASC) 10 MG tablet, TAKE 1 TABLET BY MOUTH EVERY DAY   fenofibrate 160 MG tablet, TAKE 1 TABLET BY MOUTH EVERY DAY   furosemide (LASIX) 20 MG tablet, TAKE 1 TO 2 TABLETS BY MOUTH TWICE A DAY AS NEEDED FOR EDEMA   lisinopril (ZESTRIL) 40 MG tablet, TAKE 1 TABLET BY MOUTH EVERY DAY   metoprolol succinate (TOPROL-XL) 100 MG 24 hr tablet, TAKE 1 TABLET (100 MG TOTAL) BY MOUTH DAILY.   rosuvastatin (CRESTOR) 10 MG tablet, TAKE 1 TABLET BY MOUTH DAILY. PLEASE KEEP UPCOMING APPT FOR FUTURE REFILLS. THANK YOU  Current Outpatient Medications (Respiratory):    albuterol (VENTOLIN HFA) 108 (90 Base) MCG/ACT inhaler, TAKE 2 PUFFS BY MOUTH EVERY 6 HOURS AS NEEDED FOR WHEEZE OR SHORTNESS OF BREATH  Current Outpatient Medications (Analgesics):    acetaminophen (TYLENOL) 500 MG tablet, Take 1,000 mg by mouth every 6 (six) hours as needed for moderate pain. Taking 650 mg arthritis BID   aspirin EC 81 MG tablet, Take 81 mg by mouth daily.   celecoxib (CELEBREX) 200 MG capsule, Take 1 capsule (200 mg total) by mouth 2 (two) times daily.  Current Outpatient Medications (Hematological):    Cyanocobalamin (VITAMIN B-12 PO), Take 1,000 mcg by mouth.   Current Outpatient Medications (Other):    APPLE CIDER VINEGAR PO, Take 450 mg by mouth.   Ascorbic Acid (VITAMIN C) 500 MG CAPS, Take by mouth.   Blood Glucose Monitoring Suppl (FREESTYLE LITE) DEVI, Use to test sugars twice daily.Dx E11.9   Blood Glucose Monitoring Suppl (FREESTYLE LITE) w/Device KIT, USE TO TEST SUGARS TWICE DAILY   Cholecalciferol (VITAMIN D3 PO), Take 2,000 Units by mouth daily.    Coconut Oil 1000 MG CAPS, Take by mouth.   gabapentin  (NEURONTIN) 300 MG capsule, TAKE 1 CAPSULE BY MOUTH THREE TIMES A DAY   glucose blood (FREESTYLE LITE) test strip, Use as instructed to test sugars twice daily. Dx. E11.9   Iron-Vitamins (GERITOL PO), Take by mouth.   ketorolac (ACULAR) 0.5 % ophthalmic solution, Place 1 drop into the right eye 4 (four) times daily beginning after surgery   Lancets (FREESTYLE) lancets, Use as instructed to test sugars twice daily. Dx. E11.9   magnesium gluconate (MAGONATE) 500 MG tablet, Take 500 mg by mouth daily.    milk thistle 175 MG tablet, Take 175 mg by mouth daily.   Misc Natural Products (TART CHERRY ADVANCED PO), Take 1,200 mg  by mouth daily.    moxifloxacin (VIGAMOX) 0.5 % ophthalmic solution, Place 1 drop into the right eye 4 (four) times daily. Start after surgery.   Omega-3 Fatty Acids (SALMON OIL-1000 PO), Take by mouth.   Potassium 99 MG TABS, Take 99 mg by mouth.   prednisoLONE acetate (PRED FORTE) 1 % ophthalmic suspension, Place 1 drop into the operative eye 4 (four) times daily. Start after surgery.   traZODone (DESYREL) 100 MG tablet, TAKE 1 TABLET BY MOUTH EVERYDAY AT BEDTIME   Turmeric Curcumin 500 MG CAPS, Take by mouth.   zinc gluconate 50 MG tablet, Take 50 mg by mouth daily.   Reviewed prior external information including notes and imaging from  primary care provider As well as notes that were available from care everywhere and other healthcare systems.  Past medical history, social, surgical and family history all reviewed in electronic medical record.  No pertanent information unless stated regarding to the chief complaint.   Review of Systems:  No headache, visual changes, nausea, vomiting, diarrhea, constipation, dizziness, abdominal pain, skin rash, fevers, chills, night sweats, weight loss, swollen lymph nodes, body aches, joint swelling, chest pain, shortness of breath, mood changes. POSITIVE muscle aches  Objective  Blood pressure 120/70, pulse 73, height '5\' 9"'  (1.753 m),  weight (!) 334 lb (151.5 kg), SpO2 98 %.   General: No apparent distress alert and oriented x3 mood and affect normal, dressed appropriately.  Morbid obesity HEENT: Pupils equal, extraocular movements intact  Respiratory: Patient's speak in full sentences and does not appear short of breath  Cardiovascular: 1+ lower extremity edema, non tender, no erythema  Gait antalgic using a rolling walker Knee exam bilaterally are tender to palpation with trace effusion noted to the patellofemoral joint.  Lacks last 10 degrees of flexion on the left knee and only 5 degrees on the right knee.  It is noted.  After informed written and verbal consent, patient was seated on exam table. Right knee was prepped with alcohol swab and utilizing anterolateral approach, patient's right knee space was injected with 4:1  marcaine 0.5%: Kenalog 11m/dL. Patient tolerated the procedure well without immediate complications.  After informed written and verbal consent, patient was seated on exam table. Left knee was prepped with alcohol swab and utilizing anterolateral approach, patient's left knee space was injected with 4:1  marcaine 0.5%: Kenalog 462mdL. Patient tolerated the procedure well without immediate complications.   Impression and Recommendations:     The above documentation has been reviewed and is accurate and complete ZaLyndal PulleyDO

## 2020-08-26 NOTE — Progress Notes (Signed)
This patient returns to my office for at risk foot care.  This patient requires this care by a professional since this patient will be at risk due to having type 2 diabetes.  This patient is unable to cut nails himself since the patient cannot reach his nails.These nails are painful walking and wearing shoes.  This patient presents for at risk foot care today.  General Appearance  Alert, conversant and in no acute stress.  Vascular  Dorsalis pedis and posterior tibial  pulses are palpable  bilaterally.  Capillary return is within normal limits  bilaterally. Temperature is within normal limits  bilaterally.  Neurologic  Senn-Weinstein monofilament wire test within normal limits  bilaterally. Muscle power within normal limits bilaterally.  Nails Thick disfigured discolored nails with subungual debris  from hallux to fifth toes bilaterally. No evidence of bacterial infection or drainage bilaterally.  Orthopedic  No limitations of motion  feet .  No crepitus or effusions noted.  HAV  B/L.  Hammer toes  B/L.  Skin  normotropic skin with no porokeratosis noted bilaterally.  No signs of infections or ulcers noted.     Onychomycosis  Pain in right toes  Pain in left toes  Consent was obtained for treatment procedures.   Mechanical debridement of nails 1-5  bilaterally performed with a nail nipper.  Filed with dremel without incident.    Return office visit    3 months                  Told patient to return for periodic foot care and evaluation due to potential at risk complications.   Zaneta Lightcap DPM   

## 2020-08-30 ENCOUNTER — Encounter: Payer: Self-pay | Admitting: Family Medicine

## 2020-08-30 ENCOUNTER — Ambulatory Visit (INDEPENDENT_AMBULATORY_CARE_PROVIDER_SITE_OTHER): Payer: 59 | Admitting: Family Medicine

## 2020-08-30 ENCOUNTER — Other Ambulatory Visit: Payer: Self-pay

## 2020-08-30 DIAGNOSIS — M170 Bilateral primary osteoarthritis of knee: Secondary | ICD-10-CM | POA: Diagnosis not present

## 2020-08-30 NOTE — Patient Instructions (Signed)
Good to see you  You know the drill  See me again in 10 weeks

## 2020-08-30 NOTE — Assessment & Plan Note (Signed)
Chronic problem with exacerbation.  Patient encouraged to lose weight.  Discussed the over-the-counter topical anti-inflammatories.  Patient does seem to respond better to the steroid injections and seems to do well every 10 weeks.  Patient will follow-up at that time but if any worsening pain before that we do need to consider the possibility of attempt of viscosupplementation again.  Did discuss PRP with patient.  Patient will consider it.  Follow-up again in 10 weeks

## 2020-09-02 ENCOUNTER — Encounter: Payer: 59 | Admitting: Family Medicine

## 2020-09-21 ENCOUNTER — Telehealth: Payer: Self-pay | Admitting: Internal Medicine

## 2020-09-21 DIAGNOSIS — G4733 Obstructive sleep apnea (adult) (pediatric): Secondary | ICD-10-CM

## 2020-09-21 NOTE — Telephone Encounter (Signed)
Please order DME [facility]- please replace old CPAP machine, change to auto 10-20, mask of choice,e humidifier, supplies, AirView/ card

## 2020-09-21 NOTE — Telephone Encounter (Signed)
I called the pt and he stated that he is having issues with his cpap.  He said that [employer] was trying to help him change his email to be able to get the DL but his cpap is so old it will not configure with the updated sytem.  They are telling him that he will need a new machine to be able to retrieve the DL's.  Pt was last seen 05/16/2015.  He is scheduled for OV on 11/29/2020 with CY.  CY please advise. Thanks

## 2020-09-21 NOTE — Telephone Encounter (Signed)
Order placed for DME to set pt up with new Cpap machine since the old machiine is out of date.  Called and spoke with pt and he is aware and nothing further is needed.

## 2020-09-23 ENCOUNTER — Other Ambulatory Visit: Payer: Self-pay | Admitting: Family Medicine

## 2020-09-23 ENCOUNTER — Other Ambulatory Visit (HOSPITAL_COMMUNITY): Payer: Self-pay

## 2020-09-23 MED ORDER — CELECOXIB 200 MG PO CAPS
200.0000 mg | ORAL_CAPSULE | Freq: Two times a day (BID) | ORAL | 3 refills | Status: DC
  Filled 2020-09-23: qty 60, 30d supply, fill #0
  Filled 2020-10-25: qty 60, 30d supply, fill #1
  Filled 2020-12-02: qty 60, 30d supply, fill #2
  Filled 2021-01-01: qty 60, 30d supply, fill #3

## 2020-09-23 NOTE — Telephone Encounter (Signed)
LFD 06/02/20 #60 with no refills LOV 06/02/20 NOV 10/06/20

## 2020-09-26 ENCOUNTER — Telehealth: Payer: Self-pay | Admitting: Internal Medicine

## 2020-09-27 NOTE — Telephone Encounter (Signed)
Order was placed 09/21/20 for pt's old cpap machine to be replaced. Pt has not been seen since 09/08/2018.  Pt currently has a f/u appt scheduled 11/29/20.  Called and spoke with pt letting him know this info and asked if he would be able to be seen sooner for an appt and pt said that he could be seen sooner if anything was avail.  Appt has been rescheduled for pt for pt to be seen tomorrow 9/14. Nothing further needed.

## 2020-09-27 NOTE — Progress Notes (Signed)
HPI- Male never smoker followed for OSA, complicated by morbid obesity, CAD/MI/PTCA, HBP, DM, massive hernia NPSG  10/14/14 AHI 124.2/ hr, CPAP to 16, desat to 69%, weight 390 lbs  ===========================================================   05/16/2015-65 year old male never smoker followed for OSA, complicated by morbid obesity, CAD/MI/PTCA, HBP, DM, massive hernia CPAP 16/[facility] FOLLOW FOR:  Sleep Apnea, doing well on CPAP machine, uses Nasal pillows.  no complaints.  09/28/20- 65 year old male never smoker followed for OSA, complicated by Morbid Obesity, CAD/MI/PTCA, HTN,  DM, Pulm HTN, Diverticulosis, DM2, Hyperlipidemia, Neuropathy, Hx Guillain Barre Syndrome, Osteoarthritis, hx Prostate Cancer,  -albuterol hfa,  CPAP 16/[facility]  AirSense 10>> today order 10-20 Download- as of 04/20/20- compliance 100%, AHI 3.3/ hr Body weight today-331 lbs Covid vax-none ( hx GB ) Needs replacement old machine with auto 10-20 as ordered 9/7 Breathing stable without acute events. Rare need albuterol inhaler. Abdominal bulge previously considered hiatal hernia, he now says was some sort of cyst, not resected and persists.   ROS-see HPI   + = positive Constitutional:    weight loss, night sweats, fevers, chills, fatigue, lassitude. HEENT:    headaches, difficulty swallowing, tooth/dental problems, sore throat,       sneezing, itching, ear ache, + nasal congestion, post nasal drip, snoring CV:    chest pain, orthopnea, PND, swelling in lower extremities, anasarca,                                                       dizziness, + palpitations Resp:   + shortness of breath with exertion or at rest.                productive cough,   non-productive cough, coughing up of blood.              change in color of mucus.  wheezing.   Skin:    rash or lesions. GI:  No-   heartburn, indigestion, abdominal pain, nausea, vomiting, diarrhea,                 change in bowel habits, loss of appetite GU: dysuria,  change in color of urine, no urgency or frequency.   flank pain. MS:  + joint pain, stiffness, decreased range of motion, back pain. Neuro-     nothing unusual Psych:  change in mood or affect.  depression or anxiety.   memory loss.  OBJ- Physical Exam General- Alert, Oriented, Affect-appropriate, Distress- none acute, + morbidly obese Skin- rash-none, lesions- none, excoriation- none Lymphadenopathy- none Head- atraumatic            Eyes- Gross vision intact, PERRLA, conjunctivae and secretions clear            Ears- Hearing, canals-normal            Nose- Clear, no-Septal dev, mucus, polyps, erosion, perforation             Throat- Mallampati IV , mucosa clear , drainage- none, tonsils- atrophic Neck- flexible , trachea midline, no stridor , thyroid nl, carotid no bruit Chest - symmetrical excursion , unlabored           Heart/CV- RRR , no murmur , no gallop  , no rub, nl s1 s2                           -  JVD- none , edema- none, stasis changes- none, varices- none           Lung- clear to P&A, wheeze- none, cough- none , dullness-none, rub- none           Chest wall-  Abd- + massive pannus and significant obesity Br/ Gen/ Rectal- Not done, not indicated Extrem- cyanosis- none, clubbing, none, atrophy- none, strength- nl Neuro- grossly intact to observation

## 2020-09-28 ENCOUNTER — Other Ambulatory Visit: Payer: Self-pay

## 2020-09-28 ENCOUNTER — Encounter: Payer: Self-pay | Admitting: Internal Medicine

## 2020-09-28 ENCOUNTER — Ambulatory Visit (INDEPENDENT_AMBULATORY_CARE_PROVIDER_SITE_OTHER): Payer: 59 | Admitting: Internal Medicine

## 2020-09-28 DIAGNOSIS — G4733 Obstructive sleep apnea (adult) (pediatric): Secondary | ICD-10-CM

## 2020-09-28 NOTE — Patient Instructions (Signed)
Order- DME [facility]- please replace old CPAP machine - change to auto 10-20, mask of choice, humidifier, supplies, airView/ card  Please call if we can help

## 2020-09-30 ENCOUNTER — Other Ambulatory Visit: Payer: Self-pay | Admitting: Family Medicine

## 2020-10-04 ENCOUNTER — Encounter: Payer: Self-pay | Admitting: Internal Medicine

## 2020-10-04 NOTE — Assessment & Plan Note (Signed)
Benefits with good compliance and control Plan- replace old machine, change to auto 10-20

## 2020-10-04 NOTE — Assessment & Plan Note (Signed)
Has lost weight since 2016 sleep study, but needs to continue long term effort.

## 2020-10-06 ENCOUNTER — Encounter: Payer: Self-pay | Admitting: Family Medicine

## 2020-10-06 ENCOUNTER — Other Ambulatory Visit: Payer: Self-pay

## 2020-10-06 ENCOUNTER — Ambulatory Visit (INDEPENDENT_AMBULATORY_CARE_PROVIDER_SITE_OTHER): Payer: 59 | Admitting: Family Medicine

## 2020-10-06 VITALS — BP 120/72 | HR 67 | Temp 97.9°F | Resp 17 | Ht 69.0 in | Wt 331.0 lb

## 2020-10-06 DIAGNOSIS — E119 Type 2 diabetes mellitus without complications: Secondary | ICD-10-CM | POA: Diagnosis not present

## 2020-10-06 DIAGNOSIS — Z23 Encounter for immunization: Secondary | ICD-10-CM | POA: Diagnosis not present

## 2020-10-06 DIAGNOSIS — Z0000 Encounter for general adult medical examination without abnormal findings: Secondary | ICD-10-CM | POA: Diagnosis not present

## 2020-10-06 LAB — LIPID PANEL
Cholesterol: 167 mg/dL (ref 0–200)
HDL: 33 mg/dL — ABNORMAL LOW (ref >39.00)
NonHDL: 134.08
Total CHOL/HDL Ratio: 5
Triglycerides: 300 mg/dL — ABNORMAL HIGH (ref 0.0–149.0)
VLDL: 60 mg/dL — ABNORMAL HIGH (ref 0.0–40.0)

## 2020-10-06 LAB — HEMOGLOBIN A1C: Hgb A1c MFr Bld: 7.3 % — ABNORMAL HIGH (ref 4.6–6.5)

## 2020-10-06 LAB — BASIC METABOLIC PANEL
BUN: 18 mg/dL (ref 6–23)
CO2: 25 mEq/L (ref 19–32)
Calcium: 9.5 mg/dL (ref 8.4–10.5)
Chloride: 103 mEq/L (ref 96–112)
Creatinine, Ser: 0.82 mg/dL (ref 0.40–1.50)
GFR: 92.94 mL/min (ref >60.00)
Glucose, Bld: 120 mg/dL — ABNORMAL HIGH (ref 70–99)
Potassium: 4 mEq/L (ref 3.5–5.1)
Sodium: 139 mEq/L (ref 135–145)

## 2020-10-06 LAB — CBC WITH DIFFERENTIAL/PLATELET
Basophils Absolute: 0.1 10*3/uL (ref 0.0–0.1)
Basophils Relative: 0.8 % (ref 0.0–3.0)
Eosinophils Absolute: 0 10*3/uL (ref 0.0–0.7)
Eosinophils Relative: 0 % (ref 0.0–5.0)
HCT: 40.5 % (ref 39.0–52.0)
Hemoglobin: 13.5 g/dL (ref 13.0–17.0)
Lymphocytes Relative: 9.3 % — ABNORMAL LOW (ref 12.0–46.0)
Lymphs Abs: 1.1 10*3/uL (ref 0.7–4.0)
MCHC: 33.3 g/dL (ref 30.0–36.0)
MCV: 89 fl (ref 78.0–100.0)
Monocytes Absolute: 0.5 10*3/uL (ref 0.1–1.0)
Monocytes Relative: 4.4 % (ref 3.0–12.0)
Neutro Abs: 9.8 10*3/uL — ABNORMAL HIGH (ref 1.4–7.7)
Neutrophils Relative %: 85.5 % — ABNORMAL HIGH (ref 43.0–77.0)
Platelets: 194 10*3/uL (ref 150.0–400.0)
RBC: 4.55 Mil/uL (ref 4.22–5.81)
RDW: 14.9 % (ref 11.5–15.5)
WBC: 11.5 10*3/uL — ABNORMAL HIGH (ref 4.0–10.5)

## 2020-10-06 LAB — HEPATIC FUNCTION PANEL
ALT: 32 U/L (ref 0–53)
AST: 29 U/L (ref 0–37)
Albumin: 4.2 g/dL (ref 3.5–5.2)
Alkaline Phosphatase: 47 U/L (ref 39–117)
Bilirubin, Direct: 0.1 mg/dL (ref 0.0–0.3)
Total Bilirubin: 0.5 mg/dL (ref 0.2–1.2)
Total Protein: 6.6 g/dL (ref 6.0–8.3)

## 2020-10-06 LAB — TSH: TSH: 1.94 u[IU]/mL (ref 0.35–5.50)

## 2020-10-06 LAB — LDL CHOLESTEROL, DIRECT: Direct LDL: 103 mg/dL

## 2020-10-06 MED ORDER — SILDENAFIL CITRATE 100 MG PO TABS: 50.0000 mg | ORAL_TABLET | Freq: Every day | ORAL | 11 refills | Status: DC | PRN

## 2020-10-06 NOTE — Progress Notes (Signed)
   Subjective:    Patient ID: Travis [name], male    DOB: 1955-08-24, 65 y.o.   MRN: [id number]  HPI CPE- UTD on colonoscopy, PSA, Tdap.  Due for flu.  UTD on eye exam.  Due for foot exam.  Pt is down 10 lbs since last visit.  Patient Care Team    Relationship Specialty Notifications Start End  [name], MD PCP - General Family Medicine  04/16/19   Sueanne Margarita, MD Consulting Physician Cardiology  09/14/14   Deneise Lever, MD Consulting Physician Pulmonary Disease  01/26/16   Kathie Rhodes, MD (Inactive) Consulting Physician Urology  01/26/16   Laurence Spates, MD (Inactive) Consulting Physician Gastroenterology  01/26/16      Health Maintenance  Topic Date Due   INFLUENZA VACCINE  08/15/2020   COVID-19 Vaccine (1) 10/22/2020 (Originally 12/06/1955)   Zoster Vaccines- Shingrix (1 of 2) 01/05/2021 (Originally 06/06/1974)   FOOT EXAM  10/18/2020   HEMOGLOBIN A1C  12/03/2020   OPHTHALMOLOGY EXAM  04/22/2021   TETANUS/TDAP  06/14/2026   COLONOSCOPY (Pts 45-53yrs Insurance coverage will need to be confirmed)  12/07/2028   Hepatitis C Screening  Completed   HIV Screening  Completed   HPV VACCINES  Aged Out      Review of Systems Patient reports no vision/hearing changes, anorexia, fever ,adenopathy, persistant/recurrent hoarseness, swallowing issues, chest pain, palpitations, edema, persistant/recurrent cough, hemoptysis, dyspnea (rest,exertional, paroxysmal nocturnal), gastrointestinal  bleeding (melena, rectal bleeding), abdominal pain, excessive heart burn, GU symptoms (dysuria, hematuria, voiding/incontinence issues) syncope, focal weakness, memory loss, skin/hair/nail changes, depression, anxiety, abnormal bruising/bleeding, musculoskeletal symptoms/signs.   + neuropathy feet bilaterally + ED  This visit occurred during the SARS-CoV-2 public health emergency.  Safety protocols were in place, including screening questions prior to the visit, additional usage of staff PPE,  and extensive cleaning of exam room while observing appropriate contact time as indicated for disinfecting solutions.      Objective:   Physical Exam General Appearance:    Alert, cooperative, no distress, appears older than stated age, obese  Head:    Normocephalic, without obvious abnormality, atraumatic  Eyes:    PERRL, conjunctiva/corneas clear, EOM's intact, both eyes       Ears:    Normal TM's and external ear canals, both ears  Nose:   Deferred due to COVID  Throat:   Neck:   Supple, symmetrical, trachea midline, no adenopathy;       thyroid:  No enlargement/tenderness/nodules  Back:     Symmetric, no curvature, ROM normal, no CVA tenderness  Lungs:     Clear to auscultation bilaterally, respirations unlabored  Chest wall:    No tenderness or deformity  Heart:    Regular rate and rhythm, S1 and S2 normal, no murmur, rub   or gallop  Abdomen:     Soft, non-tender, bowel sounds active all four quadrants,    no masses, no organomegaly  Genitalia:    Deferred to Urology  Rectal:    Extremities:   Extremities normal, atraumatic, no cyanosis or edema  Pulses:   2+ and symmetric all extremities  Skin:   Skin color, texture, turgor normal, no rashes or lesions  Lymph nodes:   Cervical, supraclavicular, and axillary nodes normal  Neurologic:   CNII-XII intact. Normal strength, sensation and reflexes      throughout          Assessment & Plan:

## 2020-10-06 NOTE — Assessment & Plan Note (Signed)
Chronic problem.  Hx of adequate control.  Last A1C 7.3%  UTD on eye exam.  Foot exam done today.  On ACE for renal protection.  Check labs.  Adjust meds prn

## 2020-10-06 NOTE — Patient Instructions (Addendum)
Follow up in 3-4 months to recheck sugars We'll notify you of your lab results and make any changes if needed Continue to work on healthy diet and regular exercise- you can do it! Call with any questions or concerns Stay Safe!  Stay Healthy! Happy Fall!  Enjoy the [facility]!

## 2020-10-06 NOTE — Assessment & Plan Note (Signed)
Pt's PE unchanged from previous.  UTD on colonoscopy, PNA vaccine, flu shot given today (pt has had since his Rosalee Kaufman).  Will hold on Shingrix due to increased risk of GBS.  Check labs.  Anticipatory guidance provided.

## 2020-10-25 ENCOUNTER — Other Ambulatory Visit (HOSPITAL_COMMUNITY): Payer: Self-pay

## 2020-10-25 MED FILL — Rosuvastatin Calcium Tab 10 MG: ORAL | 90 days supply | Qty: 90 | Fill #2 | Status: AC

## 2020-10-25 MED FILL — Metoprolol Succinate Tab ER 24HR 100 MG (Tartrate Equiv): ORAL | 90 days supply | Qty: 90 | Fill #2 | Status: AC

## 2020-11-08 ENCOUNTER — Other Ambulatory Visit: Payer: Self-pay

## 2020-11-08 ENCOUNTER — Encounter: Payer: Self-pay | Admitting: Family Medicine

## 2020-11-08 ENCOUNTER — Ambulatory Visit (INDEPENDENT_AMBULATORY_CARE_PROVIDER_SITE_OTHER): Payer: 59 | Admitting: Family Medicine

## 2020-11-08 VITALS — BP 124/78 | HR 83 | Ht 69.0 in | Wt 332.0 lb

## 2020-11-08 DIAGNOSIS — M2550 Pain in unspecified joint: Secondary | ICD-10-CM

## 2020-11-08 DIAGNOSIS — M170 Bilateral primary osteoarthritis of knee: Secondary | ICD-10-CM | POA: Diagnosis not present

## 2020-11-08 NOTE — Patient Instructions (Signed)
Injected both knees today See me again in 8 weeks

## 2020-11-08 NOTE — Assessment & Plan Note (Signed)
Chronic problem with exacerbation.  Secondary to patient's body as patient is not a candidate for joint replacement at this time but encourage patient continue to attempt to lose weight.  Discussed icing regimen and home exercises, discussed continue to use a walker secondary to the instability.  Patient did have effusion of the left knee and we did get aspiration which we will send to the lab for further evaluation.  Discussed with patient about icing regimen and home exercises.  Follow-up with me again in 6 to 8 weeks

## 2020-11-08 NOTE — Addendum Note (Signed)
Addended by: Jacobo Forest on: 11/08/2020 08:21 AM   Modules accepted: Orders

## 2020-11-08 NOTE — Progress Notes (Signed)
[facility] [place] Phone: [phone] Subjective:   [name], am serving as a scribe for Dr. Hulan Saas. This visit occurred during the SARS-CoV-2 public health emergency.  Safety protocols were in place, including screening questions prior to the visit, additional usage of staff PPE, and extensive cleaning of exam room while observing appropriate contact time as indicated for disinfecting solutions.   I'm seeing this patient by the request  of:  [name], MD  CC: bilateral knee pain   HEN:IDPOEUMPNT  08/30/2020 Chronic problem with exacerbation.  Patient encouraged to lose weight.  Discussed the over-the-counter topical anti-inflammatories.  Patient does seem to respond better to the steroid injections and seems to do well every 10 weeks.  Patient will follow-up at that time but if any worsening pain before that we do need to consider the possibility of attempt of viscosupplementation again.  Did discuss PRP with patient.  Patient will consider it.  Follow-up again in 10 weeks  Update 11/08/2020 YAROSLAV GOMBOS is a 65 y.o. male coming in with complaint of B knee pain. Patient states that knee pain is increasing since last visit.  Patient states that the left knee is hurting more than the right.  Increasing instability with even things such as taking a shower recently.     Past Medical History:  Diagnosis Date   Appendicitis    Blood in stool    Coronary artery disease 1997   s/p PCI by Dr. Glade Lloyd   Diverticulosis of colon with hemorrhage 2009   Dyslipidemia, goal LDL below 70    Edema    Encounter for long-term (current) use of other medications    Essential hypertension, benign    Family history of thyroid disease    Frequent unifocal PVCs    GBS (Guillain Barre syndrome) (HCC)    Morbid obesity (HCC)    Myalgia and myositis, unspecified    Prostate cancer ([place]) 2012   Pulmonary HTN ([place])  08/14/2018   PASP 59mmHg by echo 2017   Sleep apnea with use of continuous positive airway pressure (CPAP)    Thrombocytopenia ([place]) 05/25/2014   Type II or unspecified type diabetes mellitus without mention of complication, not stated as uncontrolled    Past Surgical History:  Procedure Laterality Date   ANGIOPLASTY  1997   Dr. Glade Lloyd   APPENDECTOMY     CATARACT EXTRACTION Left 05/18/2020   CATARACT EXTRACTION Right 06/08/2020   radioactive seed prostate     Social History   Socioeconomic History   Marital status: Married    Spouse name: Not on file   Number of children: 0   Years of education: 12   Highest education level: High school graduate  Occupational History   Occupation: Scientist, research (medical): [employer]    Comment: mod/heavy lifting  Tobacco Use   Smoking status: Never   Smokeless tobacco: Never  Vaping Use   Vaping Use: Never used  Substance and Sexual Activity   Alcohol use: No   Drug use: No   Sexual activity: Not on file  Other Topics Concern   Not on file  Social History Narrative   Lives at home with his wife.   Right-handed.   Caffeine: occasional use.   Social Determinants of Health   Financial Resource Strain: Not on file  Food Insecurity: Not on file  Transportation Needs: Not on file  Physical Activity: Not on file  Stress: Not on file  Social Connections: Not on file   Allergies  Allergen Reactions   Crestor [Rosuvastatin]     Muscle Pain    Lipitor [Atorvastatin] Other (See Comments)    Arm weakness    Family History  Problem Relation Age of Onset   CVA Father    Hypertension Father    Diabetes Mother    Hypothyroidism Sister    Hypertension Sister     Current Outpatient Medications (Endocrine & Metabolic):    JANUVIA 008 MG tablet, TAKE 1 TABLET BY MOUTH EVERY DAY   metFORMIN (GLUCOPHAGE) 500 MG tablet, TAKE 2 TABLETS BY MOUTH 2 TIMES DAILY WITH A MEAL.  Current Outpatient Medications (Cardiovascular):     amLODipine (NORVASC) 10 MG tablet, TAKE 1 TABLET BY MOUTH EVERY DAY   fenofibrate 160 MG tablet, TAKE 1 TABLET BY MOUTH EVERY DAY   furosemide (LASIX) 20 MG tablet, TAKE 1 TO 2 TABLETS BY MOUTH TWICE A DAY AS NEEDED FOR EDEMA   lisinopril (ZESTRIL) 40 MG tablet, TAKE 1 TABLET BY MOUTH EVERY DAY   metoprolol succinate (TOPROL-XL) 100 MG 24 hr tablet, TAKE 1 TABLET (100 MG TOTAL) BY MOUTH DAILY.   rosuvastatin (CRESTOR) 10 MG tablet, TAKE 1 TABLET BY MOUTH DAILY. PLEASE KEEP UPCOMING APPT FOR FUTURE REFILLS. THANK YOU   sildenafil (VIAGRA) 100 MG tablet, Take 0.5-1 tablets (50-100 mg total) by mouth daily as needed for erectile dysfunction.  Current Outpatient Medications (Respiratory):    albuterol (VENTOLIN HFA) 108 (90 Base) MCG/ACT inhaler, TAKE 2 PUFFS BY MOUTH EVERY 6 HOURS AS NEEDED FOR WHEEZE OR SHORTNESS OF BREATH  Current Outpatient Medications (Analgesics):    acetaminophen (TYLENOL) 500 MG tablet, Take 1,000 mg by mouth every 6 (six) hours as needed for moderate pain. Taking 650 mg arthritis BID   aspirin EC 81 MG tablet, Take 81 mg by mouth daily.   celecoxib (CELEBREX) 200 MG capsule, Take 1 capsule (200 mg total) by mouth 2 (two) times daily.  Current Outpatient Medications (Hematological):    Cyanocobalamin (VITAMIN B-12 PO), Take 1,000 mcg by mouth.   Current Outpatient Medications (Other):    APPLE CIDER VINEGAR PO, Take 450 mg by mouth.   Ascorbic Acid (VITAMIN C) 500 MG CAPS, Take by mouth.   Blood Glucose Monitoring Suppl (FREESTYLE LITE) DEVI, Use to test sugars twice daily.Dx E11.9   Cholecalciferol (VITAMIN D3 PO), Take 2,000 Units by mouth daily.    Coconut Oil 1000 MG CAPS, Take by mouth.   gabapentin (NEURONTIN) 300 MG capsule, TAKE 1 CAPSULE BY MOUTH THREE TIMES A DAY   glucose blood (FREESTYLE LITE) test strip, Use as instructed to test sugars twice daily. Dx. E11.9   Iron-Vitamins (GERITOL PO), Take by mouth.   ketorolac (ACULAR) 0.5 % ophthalmic solution,  Place 1 drop into the right eye 4 (four) times daily beginning after surgery   Lancets (FREESTYLE) lancets, Use as instructed to test sugars twice daily. Dx. E11.9   magnesium gluconate (MAGONATE) 500 MG tablet, Take 500 mg by mouth daily.    milk thistle 175 MG tablet, Take 175 mg by mouth daily.   Misc Natural Products (TART CHERRY ADVANCED PO), Take 1,200 mg by mouth daily.    Omega-3 Fatty Acids (SALMON OIL-1000 PO), Take by mouth.   Potassium 99 MG TABS, Take 99 mg by mouth.   prednisoLONE acetate (PRED FORTE) 1 % ophthalmic suspension, Place 1 drop into the operative eye 4 (four) times daily. Start after surgery.  traZODone (DESYREL) 100 MG tablet, TAKE 1 TABLET BY MOUTH EVERYDAY AT BEDTIME   Turmeric Curcumin 500 MG CAPS, Take by mouth.   zinc gluconate 50 MG tablet, Take 50 mg by mouth daily.   Reviewed prior external information including notes and imaging from  primary care provider As well as notes that were available from care everywhere and other healthcare systems.  Past medical history, social, surgical and family history all reviewed in electronic medical record.  No pertanent information unless stated regarding to the chief complaint.   Review of Systems:  No headache, visual changes, nausea, vomiting, diarrhea, constipation, dizziness, abdominal pain, skin rash, fevers, chills, night sweats, weight loss, swollen lymph nodes, body aches, joint swelling, chest pain, shortness of breath, mood changes. POSITIVE muscle aches  Objective  Blood pressure 124/78, pulse 83, height 5\' 9"  (1.753 m), weight (!) 332 lb (150.6 kg), SpO2 97 %.   General: No apparent distress alert and oriented x3 mood and affect normal, dressed appropriately.  Morbidly obese HEENT: Pupils equal, extraocular movements intact  Respiratory: Patient's speak in full sentences and does not appear short of breath  Cardiovascular: No lower extremity edema, non tender, no erythema  Gait severely antalgic using  the aid of a rolling walker MSK: Knee: Bilateral valgus deformity noted. Large thigh to calf ratio.  Effusion noted of the left knee Tender to palpation over medial and PF joint line.  ROM decreased secondary to body habitus instability with valgus force.  painful patellar compression. Patellar glide with moderate crepitus. Patellar and quadriceps tendons unremarkable. Hamstring and quadriceps strength is normal.  After informed written and verbal consent, patient was seated on exam table. Right knee was prepped with alcohol swab and utilizing anterolateral approach, patient's right knee space was injected with 4:1  marcaine 0.5%: Kenalog 40mg /dL. Patient tolerated the procedure well without immediate complications.  After informed written and verbal consent, patient was seated on exam table. Left knee was prepped with alcohol swab and utilizing anterolateral approach, patient's left knee space was injected with 4:1  marcaine 0.5%: Kenalog 40mg /dL.  Was able to aspirate 30 cc of straw light-colored fluid with mild dark pigmentation.  This was sent to the lab.  Patient tolerated the procedure well without immediate complications.    Impression and Recommendations:     The above documentation has been reviewed and is accurate and complete Lyndal Pulley, DO

## 2020-11-09 LAB — CELL COUNT + DIFF, W/O CRYST-SYNVL FLD
Basophils, %: 0 %
Eosinophils-Synovial: 0 % (ref 0–2)
Lymphocytes-Synovial Fld: 40 % (ref 0–74)
Monocyte/Macrophage: 52 % (ref 0–69)
Neutrophil, Synovial: 1 % (ref 0–24)
Synoviocytes, %: 7 % (ref 0–15)
WBC, Synovial: 181 cells/uL — ABNORMAL HIGH (ref <150)

## 2020-11-29 ENCOUNTER — Ambulatory Visit: Payer: 59 | Admitting: Internal Medicine

## 2020-11-30 ENCOUNTER — Other Ambulatory Visit: Payer: Self-pay | Admitting: Family Medicine

## 2020-12-02 ENCOUNTER — Other Ambulatory Visit (HOSPITAL_COMMUNITY): Payer: Self-pay

## 2020-12-02 ENCOUNTER — Encounter: Payer: Self-pay | Admitting: Podiatry

## 2020-12-02 ENCOUNTER — Other Ambulatory Visit: Payer: Self-pay | Admitting: Family Medicine

## 2020-12-02 ENCOUNTER — Other Ambulatory Visit: Payer: Self-pay

## 2020-12-02 ENCOUNTER — Ambulatory Visit: Payer: 59 | Admitting: Podiatry

## 2020-12-02 DIAGNOSIS — M79674 Pain in right toe(s): Secondary | ICD-10-CM | POA: Diagnosis not present

## 2020-12-02 DIAGNOSIS — B351 Tinea unguium: Secondary | ICD-10-CM | POA: Diagnosis not present

## 2020-12-02 DIAGNOSIS — M79675 Pain in left toe(s): Secondary | ICD-10-CM | POA: Diagnosis not present

## 2020-12-02 DIAGNOSIS — E119 Type 2 diabetes mellitus without complications: Secondary | ICD-10-CM

## 2020-12-02 NOTE — Progress Notes (Signed)
This patient returns to my office for at risk foot care.  This patient requires this care by a professional since this patient will be at risk due to having type 2 diabetes.  This patient is unable to cut nails himself since the patient cannot reach his nails.These nails are painful walking and wearing shoes.  This patient presents for at risk foot care today.  General Appearance  Alert, conversant and in no acute stress.  Vascular  Dorsalis pedis and posterior tibial  pulses are palpable  bilaterally.  Capillary return is within normal limits  bilaterally. Temperature is within normal limits  bilaterally.  Neurologic  Senn-Weinstein monofilament wire test within normal limits  bilaterally. Muscle power within normal limits bilaterally.  Nails Thick disfigured discolored nails with subungual debris  from hallux to fifth toes bilaterally. No evidence of bacterial infection or drainage bilaterally.  Orthopedic  No limitations of motion  feet .  No crepitus or effusions noted.  HAV  B/L.  Hammer toes  B/L.  Skin  normotropic skin with no porokeratosis noted bilaterally.  No signs of infections or ulcers noted.     Onychomycosis  Pain in right toes  Pain in left toes  Consent was obtained for treatment procedures.   Mechanical debridement of nails 1-5  bilaterally performed with a nail nipper.  Filed with dremel without incident.    Return office visit    3 months                  Told patient to return for periodic foot care and evaluation due to potential at risk complications.   Malachi Suderman DPM   

## 2020-12-20 ENCOUNTER — Other Ambulatory Visit: Payer: Self-pay | Admitting: Family Medicine

## 2021-01-02 ENCOUNTER — Other Ambulatory Visit (HOSPITAL_COMMUNITY): Payer: Self-pay

## 2021-01-03 NOTE — Progress Notes (Signed)
Travis [name] [address] [place] Phone: [phone] Subjective:   Travis [name], am serving as a scribe for Dr. Hulan Saas. This visit occurred during the SARS-CoV-2 public health emergency.  Safety protocols were in place, including screening questions prior to the visit, additional usage of staff PPE, and extensive cleaning of exam room while observing appropriate contact time as indicated for disinfecting solutions.   I'm seeing this patient by the request  of:  [name], MD  CC: Knee pain follow-up bilateral  [id number]  11/08/2020 Chronic problem with exacerbation.  Secondary to patient's body as patient is not a candidate for joint replacement at this time but encourage patient continue to attempt to lose weight.  Discussed icing regimen and home exercises, discussed continue to use a walker secondary to the instability.  Patient did have effusion of the left knee and we did get aspiration which we will send to the lab for further evaluation.  Discussed with patient about icing regimen and home exercises.  Follow-up with me again in 6 to 8 weeks  Update 01/04/2021 Travis [name] is a 65 y.o. male coming in with complaint of B knee pain. Patient states pain is about the same. Is icing and exercising, but not on consistent basis. Back and hip have gotten better. Just an occasional flare that last about 1 hour.        Past Medical History:  Diagnosis Date   Appendicitis    Blood in stool    Coronary artery disease 1997   s/p PCI by Dr. Glade Lloyd   Diverticulosis of colon with hemorrhage 2009   Dyslipidemia, goal LDL below 70    Edema    Encounter for long-term (current) use of other medications    Essential hypertension, benign    Family history of thyroid disease    Frequent unifocal PVCs    GBS (Guillain Barre syndrome) (HCC)    Morbid obesity (HCC)    Myalgia and myositis, unspecified    Prostate cancer  ([place]) 2012   Pulmonary HTN ([place]) 08/14/2018   PASP 57mmHg by echo 2017   Sleep apnea with use of continuous positive airway pressure (CPAP)    Thrombocytopenia ([place]) 05/25/2014   Type II or unspecified type diabetes mellitus without mention of complication, not stated as uncontrolled    Past Surgical History:  Procedure Laterality Date   ANGIOPLASTY  1997   Dr. Glade Lloyd   APPENDECTOMY     CATARACT EXTRACTION Left 05/18/2020   CATARACT EXTRACTION Right 06/08/2020   radioactive seed prostate     Social History   Socioeconomic History   Marital status: Married    Spouse name: Not on file   Number of children: 0   Years of education: 12   Highest education level: High school graduate  Occupational History   Occupation: Scientist, research (medical): [employer]    Comment: mod/heavy lifting  Tobacco Use   Smoking status: Never   Smokeless tobacco: Never  Vaping Use   Vaping Use: Never used  Substance and Sexual Activity   Alcohol use: No   Drug use: No   Sexual activity: Not on file  Other Topics Concern   Not on file  Social History Narrative   Lives at home with his wife.   Right-handed.   Caffeine: occasional use.   Social Determinants of Health   Financial Resource Strain: Not on file  Food Insecurity: Not on file  Transportation Needs: Not on file  Physical Activity: Not on file  Stress: Not on file  Social Connections: Not on file   Allergies  Allergen Reactions   Crestor [Rosuvastatin]     Muscle Pain    Lipitor [Atorvastatin] Other (See Comments)    Arm weakness    Family History  Problem Relation Age of Onset   CVA Father    Hypertension Father    Diabetes Mother    Hypothyroidism Sister    Hypertension Sister     Current Outpatient Medications (Endocrine & Metabolic):    JANUVIA 825 MG tablet, TAKE 1 TABLET BY MOUTH EVERY DAY   metFORMIN (GLUCOPHAGE) 500 MG tablet, TAKE 2 TABLETS BY MOUTH 2 TIMES DAILY WITH A MEAL.  Current Outpatient  Medications (Cardiovascular):    amLODipine (NORVASC) 10 MG tablet, TAKE 1 TABLET BY MOUTH EVERY DAY   fenofibrate 160 MG tablet, TAKE 1 TABLET BY MOUTH EVERY DAY   furosemide (LASIX) 20 MG tablet, TAKE 1 TO 2 TABLETS BY MOUTH TWICE A DAY AS NEEDED FOR EDEMA   lisinopril (ZESTRIL) 40 MG tablet, TAKE 1 TABLET BY MOUTH EVERY DAY   metoprolol succinate (TOPROL-XL) 100 MG 24 hr tablet, TAKE 1 TABLET (100 MG TOTAL) BY MOUTH DAILY.   rosuvastatin (CRESTOR) 10 MG tablet, TAKE 1 TABLET BY MOUTH DAILY. PLEASE KEEP UPCOMING APPT FOR FUTURE REFILLS. THANK YOU   sildenafil (VIAGRA) 100 MG tablet, Take 0.5-1 tablets (50-100 mg total) by mouth daily as needed for erectile dysfunction.  Current Outpatient Medications (Respiratory):    albuterol (VENTOLIN HFA) 108 (90 Base) MCG/ACT inhaler, TAKE 2 PUFFS BY MOUTH EVERY 6 HOURS AS NEEDED FOR WHEEZE OR SHORTNESS OF BREATH  Current Outpatient Medications (Analgesics):    acetaminophen (TYLENOL) 500 MG tablet, Take 1,000 mg by mouth every 6 (six) hours as needed for moderate pain. Taking 650 mg arthritis BID   aspirin EC 81 MG tablet, Take 81 mg by mouth daily.   celecoxib (CELEBREX) 200 MG capsule, Take 1 capsule (200 mg total) by mouth 2 (two) times daily.  Current Outpatient Medications (Hematological):    Cyanocobalamin (VITAMIN B-12 PO), Take 1,000 mcg by mouth.   Current Outpatient Medications (Other):    APPLE CIDER VINEGAR PO, Take 450 mg by mouth.   Ascorbic Acid (VITAMIN C) 500 MG CAPS, Take by mouth.   Blood Glucose Monitoring Suppl (FREESTYLE LITE) DEVI, Use to test sugars twice daily.Dx E11.9   Cholecalciferol (VITAMIN D3 PO), Take 2,000 Units by mouth daily.    Coconut Oil 1000 MG CAPS, Take by mouth.   FREESTYLE LITE test strip, USE AS INSTRUCTED TO TEST SUGARS TWICE DAILY. DX. E11.9   gabapentin (NEURONTIN) 300 MG capsule, TAKE 1 CAPSULE BY MOUTH THREE TIMES A DAY   Iron-Vitamins (GERITOL PO), Take by mouth.   ketorolac (ACULAR) 0.5 %  ophthalmic solution, Place 1 drop into the right eye 4 (four) times daily beginning after surgery   Lancets (FREESTYLE) lancets, Use as instructed to test sugars twice daily. Dx. E11.9   magnesium gluconate (MAGONATE) 500 MG tablet, Take 500 mg by mouth daily.    milk thistle 175 MG tablet, Take 175 mg by mouth daily.   Misc Natural Products (TART CHERRY ADVANCED PO), Take 1,200 mg by mouth daily.    Omega-3 Fatty Acids (SALMON OIL-1000 PO), Take by mouth.   Potassium 99 MG TABS, Take 99 mg by mouth.   prednisoLONE acetate (PRED FORTE) 1 % ophthalmic suspension, Place 1 drop into the  operative eye 4 (four) times daily. Start after surgery.   traZODone (DESYREL) 100 MG tablet, TAKE 1 TABLET BY MOUTH EVERYDAY AT BEDTIME   Turmeric Curcumin 500 MG CAPS, Take by mouth.   zinc gluconate 50 MG tablet, Take 50 mg by mouth daily.   Reviewed prior external information including notes and imaging from  primary care provider As well as notes that were available from care everywhere and other healthcare systems.  Past medical history, social, surgical and family history all reviewed in electronic medical record.  No pertanent information unless stated regarding to the chief complaint.   Review of Systems:  No headache, visual changes, nausea, vomiting, diarrhea, constipation, dizziness, abdominal pain, skin rash, fevers, chills, night sweats, weight loss, swollen lymph nodes,  chest pain, shortness of breath, mood changes. POSITIVE muscle aches, body aches, joint swelling  Objective  Blood pressure 122/78, pulse 76, height 5\' 9"  (1.753 m), weight (!) 335 lb (152 kg), SpO2 96 %.   General: No apparent distress alert and oriented x3 mood and affect normal, dressed appropriately.  Morbidly obese HEENT: Pupils equal, extraocular movements intact  Respiratory: Patient's speak in full sentences and does not appear short of breath  Cardiovascular: 1+ lower extremity edema, non tender, no erythema  Gait  antalgic gait using the aid of a walker Knee exams bilaterally do show by patient does have some instability with valgus and varus force.  The patient does have effusion noted of the left knee.  Has limited range of motion more on the left than the right.  Tender to palpation over the medial joint line bilaterally.   After informed written and verbal consent, patient was seated on exam table. Left knee was prepped with alcohol swab and utilizing anterolateral approach, patient's left knee space was injected with 4:1  marcaine 0.5%: Kenalog 40mg /dL. Patient tolerated the procedure well without immediate complications.  After informed written and verbal consent, patient was seated on exam table. Right knee was prepped with alcohol swab and utilizing anterolateral approach, patient's right knee space was injected with 4:1  marcaine 0.5%: Kenalog 40mg /dL. Patient tolerated the procedure well without immediate complications.   Impression and Recommendations:     The above documentation has been reviewed and is accurate and complete Lyndal Pulley, DO

## 2021-01-04 ENCOUNTER — Other Ambulatory Visit: Payer: Self-pay

## 2021-01-04 ENCOUNTER — Ambulatory Visit (INDEPENDENT_AMBULATORY_CARE_PROVIDER_SITE_OTHER): Payer: 59 | Admitting: Family Medicine

## 2021-01-04 DIAGNOSIS — M170 Bilateral primary osteoarthritis of knee: Secondary | ICD-10-CM

## 2021-01-04 NOTE — Patient Instructions (Signed)
Injections today Good to see you! Travel safe  See you again in 2 months

## 2021-01-04 NOTE — Assessment & Plan Note (Addendum)
Chronic, with exacerbation.  Patient does have instability noted.  Encouraged to lose weight.  Patient knows he needs a BMI under 40 to truly have knee replaced patient otherwise feels the steroid injections do better than the viscosupplementation.  We will continue to consider though the viscosupplementation if we keep using steroids too often.  Follow-up again in 8 to 10 weeks

## 2021-01-09 ENCOUNTER — Other Ambulatory Visit: Payer: Self-pay | Admitting: Family Medicine

## 2021-01-10 ENCOUNTER — Other Ambulatory Visit: Payer: Self-pay | Admitting: Family Medicine

## 2021-01-13 ENCOUNTER — Ambulatory Visit: Payer: 59 | Admitting: Family Medicine

## 2021-01-18 ENCOUNTER — Encounter: Payer: Self-pay | Admitting: Family Medicine

## 2021-01-18 ENCOUNTER — Ambulatory Visit (INDEPENDENT_AMBULATORY_CARE_PROVIDER_SITE_OTHER): Payer: Managed Care, Other (non HMO) | Admitting: Family Medicine

## 2021-01-18 VITALS — BP 132/78 | HR 84 | Temp 97.9°F | Resp 18 | Wt 329.2 lb

## 2021-01-18 DIAGNOSIS — E119 Type 2 diabetes mellitus without complications: Secondary | ICD-10-CM | POA: Diagnosis not present

## 2021-01-18 DIAGNOSIS — F32A Depression, unspecified: Secondary | ICD-10-CM

## 2021-01-18 LAB — BASIC METABOLIC PANEL
BUN: 17 mg/dL (ref 6–23)
CO2: 25 mEq/L (ref 19–32)
Calcium: 9.3 mg/dL (ref 8.4–10.5)
Chloride: 103 mEq/L (ref 96–112)
Creatinine, Ser: 0.71 mg/dL (ref 0.40–1.50)
GFR: 96.88 mL/min (ref >60.00)
Glucose, Bld: 136 mg/dL — ABNORMAL HIGH (ref 70–99)
Potassium: 4.3 mEq/L (ref 3.5–5.1)
Sodium: 139 mEq/L (ref 135–145)

## 2021-01-18 LAB — HEMOGLOBIN A1C: Hgb A1c MFr Bld: 6.9 % — ABNORMAL HIGH (ref 4.6–6.5)

## 2021-01-18 MED ORDER — BUPROPION HCL ER (XL) 150 MG PO TB24: 150.0000 mg | ORAL_TABLET | Freq: Every day | ORAL | 3 refills | Status: DC

## 2021-01-18 NOTE — Patient Instructions (Addendum)
Follow up in 3 months to recheck diabetes and mood We'll notify you of your lab results and make any changes if needed START the Wellbutrin once daily to help w/ mood and motivation Continue to work on healthy diet and regular exercise- you can do it! Call with any questions or concerns Stay Safe!  Stay Healthy! Happy New Year!!

## 2021-01-18 NOTE — Assessment & Plan Note (Signed)
Chronic problem.  On Januvia and Metformin w/o difficulty.  UTD on eye exam, foot exam.  On ACE for renal protection.  Is down 6 lbs since last visit.  Check labs.  Adjust meds prn

## 2021-01-18 NOTE — Progress Notes (Signed)
° °  Subjective:    Patient ID: Travis [name], male    DOB: [birth year], 66 y.o.   MRN: [id number]  HPI DM- chronic problem, on Januvia 100mg  daily, Metformin 1000mg  BID.  Last A1C 7.3%  On ACE for renal protection.  UTD on eye exam, foot exam.  Is down 6 lbs since last visit.  Depression- 'i'm just a little blue'.  'i think it all caught up to me'.  Pt reports the few months have been difficult.  Not currently on medication.  Pt reports little motivation.  Is crying at TV shows.  Admits to being lonely and having 'a very small circle'.  Increased irritability.  Is also concerned about wife's blood sugar.   Review of Systems For ROS see HPI   This visit occurred during the SARS-CoV-2 public health emergency.  Safety protocols were in place, including screening questions prior to the visit, additional usage of staff PPE, and extensive cleaning of exam room while observing appropriate contact time as indicated for disinfecting solutions.      Objective:   Physical Exam Vitals reviewed.  Constitutional:      General: He is not in acute distress.    Appearance: He is well-developed. He is obese. He is not ill-appearing.  HENT:     Head: Normocephalic and atraumatic.  Eyes:     Extraocular Movements: Extraocular movements intact.     Conjunctiva/sclera: Conjunctivae normal.     Pupils: Pupils are equal, round, and reactive to light.  Neck:     Thyroid: No thyromegaly.  Cardiovascular:     Rate and Rhythm: Normal rate and regular rhythm.     Pulses: Normal pulses.     Heart sounds: Normal heart sounds. No murmur heard. Pulmonary:     Effort: Pulmonary effort is normal. No respiratory distress.     Breath sounds: Normal breath sounds.  Abdominal:     General: Bowel sounds are normal. There is no distension.     Palpations: Abdomen is soft.  Musculoskeletal:     Cervical back: Normal range of motion and neck supple.     Right lower leg: No edema.     Left lower leg: No edema.   Lymphadenopathy:     Cervical: No cervical adenopathy.  Skin:    General: Skin is warm and dry.  Neurological:     General: No focal deficit present.     Mental Status: He is alert and oriented to person, place, and time.     Cranial Nerves: No cranial nerve deficit.  Psychiatric:        Behavior: Behavior normal.     Comments: Tangential thought process- jumping from topic to topic Flat affect, seems very 'down'          Assessment & Plan:

## 2021-01-18 NOTE — Assessment & Plan Note (Signed)
New.  Pt reports that the last few months have been difficult.  Increased sadness, irritability, tearfulness, lack of motivation.  Is open to the idea of starting medication.  Will start Wellbutrin as it is more activating and will hopefully improve both mood and motivation.  Will follow.

## 2021-01-19 ENCOUNTER — Other Ambulatory Visit: Payer: Self-pay | Admitting: Family Medicine

## 2021-01-19 ENCOUNTER — Other Ambulatory Visit (HOSPITAL_COMMUNITY): Payer: Self-pay

## 2021-01-19 ENCOUNTER — Other Ambulatory Visit: Payer: Self-pay | Admitting: Family

## 2021-01-19 ENCOUNTER — Telehealth: Payer: Self-pay

## 2021-01-19 ENCOUNTER — Other Ambulatory Visit: Payer: Self-pay | Admitting: Cardiovascular Disease

## 2021-01-19 MED ORDER — METOPROLOL SUCCINATE ER 100 MG PO TB24
100.0000 mg | ORAL_TABLET | Freq: Every day | ORAL | 0 refills | Status: DC
  Filled 2021-01-19: qty 90, 90d supply, fill #0

## 2021-01-19 MED ORDER — CELECOXIB 200 MG PO CAPS
200.0000 mg | ORAL_CAPSULE | Freq: Two times a day (BID) | ORAL | 3 refills | Status: DC
  Filled 2021-01-19 – 2021-02-15 (×2): qty 60, 30d supply, fill #0
  Filled 2021-03-20: qty 60, 30d supply, fill #1
  Filled 2021-04-18: qty 60, 30d supply, fill #2

## 2021-01-19 MED ORDER — ROSUVASTATIN CALCIUM 10 MG PO TABS
10.0000 mg | ORAL_TABLET | Freq: Every day | ORAL | 0 refills | Status: DC
  Filled 2021-01-19: qty 90, 90d supply, fill #0

## 2021-01-19 NOTE — Telephone Encounter (Signed)
Spoke to patient, he is aware of the labs

## 2021-01-19 NOTE — Telephone Encounter (Signed)
-----   Message from [name], MD sent at 01/19/2021  7:25 AM EST ----- A1C is better at 6.9!  This is great news!  No med changes at this time

## 2021-02-15 ENCOUNTER — Other Ambulatory Visit (HOSPITAL_COMMUNITY): Payer: Self-pay

## 2021-02-19 NOTE — Progress Notes (Signed)
Cardiology Office Note:   Date:  02/21/2021  NAME:  Travis [name]    MRN: [id number] DOB:  [birth year]   PCP:  [name], MD  Cardiologist:  None  Electrophysiologist:  None   Referring MD: [name], MD   Chief Complaint  Patient presents with   Follow-up        History of Present Illness:   Travis [name] is a 66 y.o. male with a hx of CAD s/p POBA, HLD, DM, HTN who presents for follow-up.  He reports he is doing well.  Denies any chest pain or trouble breathing.  He is exercising on most days.  He gets around 3000 steps when he exercises.  He is lost roughly 20 pounds since last year.  Working on diet and exercising more.  Blood pressure is well controlled.  Most recent A1c is 6.9 which is at goal.  Cholesterol levels are not at goal.  He is tolerating Crestor 10 mg daily.  We discussed increasing to 20.  He is on fenofibrate.  Triglycerides close to goal.  Denies any major symptoms in office today.  No symptoms of palpitations.  History of PVCs in the past.  He does have sleep apnea and wears his machine.  Problem List 1. CAD -POBA 1997 2. HTN 3. DM -A1c 6.9 4. HLD -T chol 167, HDL 33, LDL 103, TG 300 5. PVCs  -brief 6. Guillain Barre Syndrome  7. OSA  Past Medical History: Past Medical History:  Diagnosis Date   Appendicitis    Blood in stool    Coronary artery disease 1997   s/p PCI by Dr. Glade Lloyd   Diverticulosis of colon with hemorrhage 2009   Dyslipidemia, goal LDL below 70    Edema    Encounter for long-term (current) use of other medications    Essential hypertension, benign    Family history of thyroid disease    Frequent unifocal PVCs    GBS (Guillain Barre syndrome) ([place])    Morbid obesity ([place])    Myalgia and myositis, unspecified    Prostate cancer ([place]) 2012   Pulmonary HTN ([place]) 08/14/2018   PASP 16mmHg by echo 2017   Sleep apnea with use of continuous positive airway pressure (CPAP)    Thrombocytopenia ([place]) 05/25/2014   Type  II or unspecified type diabetes mellitus without mention of complication, not stated as uncontrolled     Past Surgical History: Past Surgical History:  Procedure Laterality Date   ANGIOPLASTY  1997   Dr. Glade Lloyd   APPENDECTOMY     CATARACT EXTRACTION Left 05/18/2020   CATARACT EXTRACTION Right 06/08/2020   radioactive seed prostate      Current Medications: Current Meds  Medication Sig   acetaminophen (TYLENOL) 500 MG tablet Take 1,000 mg by mouth every 6 (six) hours as needed for moderate pain. Taking 650 mg arthritis BID   amLODipine (NORVASC) 10 MG tablet TAKE 1 TABLET BY MOUTH EVERY DAY   APPLE CIDER VINEGAR PO Take 450 mg by mouth.   Ascorbic Acid (VITAMIN C) 500 MG CAPS Take by mouth.   aspirin EC 81 MG tablet Take 81 mg by mouth daily.   Blood Glucose Monitoring Suppl (FREESTYLE LITE) DEVI Use to test sugars twice daily.Dx E11.9   buPROPion (WELLBUTRIN XL) 150 MG 24 hr tablet Take 1 tablet (150 mg total) by mouth daily.   celecoxib (CELEBREX) 200 MG capsule Take 1 capsule (200 mg total) by mouth 2 (two) times daily.  Cholecalciferol (VITAMIN D3 PO) Take 2,000 Units by mouth daily.    Coconut Oil 1000 MG CAPS Take by mouth.   Cyanocobalamin (VITAMIN B-12 PO) Take 1,000 mcg by mouth.    fenofibrate 160 MG tablet TAKE 1 TABLET BY MOUTH EVERY DAY   FREESTYLE LITE test strip USE AS INSTRUCTED TO TEST SUGARS TWICE DAILY. DX. E11.9   gabapentin (NEURONTIN) 300 MG capsule TAKE 1 CAPSULE BY MOUTH THREE TIMES A DAY   Iron-Vitamins (GERITOL PO) Take by mouth.   JANUVIA 100 MG tablet TAKE 1 TABLET BY MOUTH EVERY DAY   Lancets (FREESTYLE) lancets Use as instructed to test sugars twice daily. Dx. E11.9   lisinopril (ZESTRIL) 40 MG tablet TAKE 1 TABLET BY MOUTH EVERY DAY   magnesium gluconate (MAGONATE) 500 MG tablet Take 500 mg by mouth daily.    metFORMIN (GLUCOPHAGE) 500 MG tablet TAKE 2 TABLETS BY MOUTH 2 TIMES DAILY WITH A MEAL.   metoprolol succinate (TOPROL-XL) 100 MG 24 hr  tablet Take 1 tablet (100 mg total) by mouth daily.   milk thistle 175 MG tablet Take 175 mg by mouth daily.   Misc Natural Products (TART CHERRY ADVANCED PO) Take 1,200 mg by mouth daily.    Omega-3 Fatty Acids (SALMON OIL-1000 PO) Take by mouth.   Potassium 99 MG TABS Take 99 mg by mouth.   sildenafil (VIAGRA) 100 MG tablet Take 0.5-1 tablets (50-100 mg total) by mouth daily as needed for erectile dysfunction.   traZODone (DESYREL) 100 MG tablet TAKE 1 TABLET BY MOUTH EVERYDAY AT BEDTIME   Turmeric Curcumin 500 MG CAPS Take by mouth.   zinc gluconate 50 MG tablet Take 50 mg by mouth daily.   [DISCONTINUED] rosuvastatin (CRESTOR) 10 MG tablet Take 1 tablet (10 mg total) by mouth daily.     Allergies:    Crestor [rosuvastatin] and Lipitor [atorvastatin]   Social History: Social History   Socioeconomic History   Marital status: Married    Spouse name: Not on file   Number of children: 0   Years of education: 12   Highest education level: High school graduate  Occupational History   Occupation: Scientist, research (medical):     Comment: mod/heavy lifting  Tobacco Use   Smoking status: Never   Smokeless tobacco: Never  Vaping Use   Vaping Use: Never used  Substance and Sexual Activity   Alcohol use: No   Drug use: No   Sexual activity: Not on file  Other Topics Concern   Not on file  Social History Narrative   Lives at home with his wife.   Right-handed.   Caffeine: occasional use.   Social Determinants of Health   Financial Resource Strain: Not on file  Food Insecurity: Not on file  Transportation Needs: Not on file  Physical Activity: Not on file  Stress: Not on file  Social Connections: Not on file     Family History: The patient's family history includes CVA in his father; Diabetes in his mother; Hypertension in his father and sister; Hypothyroidism in his sister.  ROS:   All other ROS reviewed and negative. Pertinent positives noted in the HPI.      EKGs/Labs/Other Studies Reviewed:   The following studies were personally reviewed by me today:  TTE 08/25/2018  1. The left ventricle has normal systolic function with an ejection  fraction of 60-65%. The cavity size was normal. Left ventricular diastolic  Doppler parameters are consistent with pseudonormalization. Elevated left  ventricular end-diastolic pressure  No evidence of left ventricular regional wall motion abnormalities.   2. The right ventricle has normal systolic function. The cavity was  mildly enlarged. There is no increase in right ventricular wall thickness.  Right ventricular systolic pressure Cannot assess as IVC is not  visualized. There is at least moderate  pulmonary HTN as the TR pk gradient is 38mmHg..   3. Left atrial size was mildly dilated.   4. The aortic valve is tricuspid. Mild sclerosis of the aortic valve.   5. The aorta is normal in size and structure.   Recent Labs: 10/06/2020: ALT 32; Hemoglobin 13.5; Platelets 194.0; TSH 1.94 01/18/2021: BUN 17; Creatinine, Ser 0.71; Potassium 4.3; Sodium 139   Recent Lipid Panel    Component Value Date/Time   CHOL 167 10/06/2020 0940   CHOL 131 02/08/2016 0841   TRIG 300.0 (H) 10/06/2020 0940   HDL 33.00 (L) 10/06/2020 0940   HDL 32 (L) 02/08/2016 0841   CHOLHDL 5 10/06/2020 0940   VLDL 60.0 (H) 10/06/2020 0940   LDLCALC 69 12/15/2018 1352   LDLCALC 71 02/08/2016 0841   LDLDIRECT 103.0 10/06/2020 0940    Physical Exam:   VS:  BP 138/80 (BP Location: Left Arm, Patient Position: Sitting, Cuff Size: Large)    Pulse 71    Ht 5\' 9"  (1.753 m)    Wt (!) 326 lb 12.8 oz (148.2 kg)    SpO2 98%    BMI 48.26 kg/m    Wt Readings from Last 3 Encounters:  02/21/21 (!) 326 lb 12.8 oz (148.2 kg)  01/18/21 (!) 329 lb 3.2 oz (149.3 kg)  01/04/21 (!) 335 lb (152 kg)    General: Well nourished, well developed, in no acute distress Head: Atraumatic, normal size  Eyes: PEERLA, EOMI  Neck: Supple, no JVD Endocrine: No  thryomegaly Cardiac: Normal S1, S2; RRR; no murmurs, rubs, or gallops Lungs: Clear to auscultation bilaterally, no wheezing, rhonchi or rales  Abd: Soft, nontender, no hepatomegaly  Ext: No edema, pulses 2+ Musculoskeletal: No deformities, BUE and BLE strength normal and equal Skin: Warm and dry, no rashes   Neuro: Alert and oriented to person, place, time, and situation, CNII-XII grossly intact, no focal deficits  Psych: Normal mood and affect   ASSESSMENT:   Travis [name] is a 66 y.o. male who presents for the following: 1. Coronary artery disease involving native coronary artery of native heart without angina pectoris   2. Mixed hyperlipidemia   3. PVC (premature ventricular contraction)   4. Obesity, morbid, BMI 40.0-49.9 ([place])    PLAN:   1. Coronary artery disease involving native coronary artery of native heart without angina pectoris 2. Mixed hyperlipidemia -History of angioplasty in 1997.  Echo shows normal LV function.  No recurrence of symptoms.  He will continue aspirin 81 mg daily.  He is on Crestor and fenofibrate.  LDL cholesterol not at goal.  Had issues with statins in the past.  Tolerating 10 mg of Crestor.  We will increase the dose to 20 mg daily.  He will continue fenofibrate.  He will continue to work on his diabetes.  I have recommended regular exercise and weight loss.  Blood pressure is well controlled on current regimen.  3. PVC (premature ventricular contraction) -No recurrence of symptoms.  Echo shows normal LV function.  He does have evidence of pulmonary hypertension but this is likely related to sleep apnea.  4. Obesity, morbid, BMI 40.0-49.9 ([place]) -Diet and exercise recommended.  Disposition: Return in about 1 year (around 02/21/2022).  Medication Adjustments/Labs and Tests Ordered: Current medicines are reviewed at length with the patient today.  Concerns regarding medicines are outlined above.  No orders of the defined types were placed in this  encounter.  Meds ordered this encounter  Medications   rosuvastatin (CRESTOR) 20 MG tablet    Sig: Take 1 tablet (20 mg total) by mouth daily.    Dispense:  90 tablet    Refill:  1    Patient Instructions  Medication Instructions:  Increase Crestor to 20 mg daily   *If you need a refill on your cardiac medications before your next appointment, please call your pharmacy*   Follow-Up: At [facility], you and your health needs are our priority.  As part of our continuing mission to provide you with exceptional heart care, we have created designated Provider Care Teams.  These Care Teams include your primary Cardiologist (physician) and Advanced Practice Providers (APPs -  Physician Assistants and Nurse Practitioners) who all work together to provide you with the care you need, when you need it.  We recommend signing up for the patient portal called "MyChart".  Sign up information is provided on this After Visit Summary.  MyChart is used to connect with patients for Virtual Visits (Telemedicine).  Patients are able to view lab/test results, encounter notes, upcoming appointments, etc.  Non-urgent messages can be sent to your provider as well.   To learn more about what you can do with MyChart, go to [email or web address].    Your next appointment:   12 month(s)  The format for your next appointment:   In Person  Provider:   Eleonore Chiquito, MD or Sande Rives, PA-C, or Almyra Deforest, PA-C       Time Spent with Patient: I have spent a total of 35 minutes with patient reviewing hospital notes, telemetry, EKGs, labs and examining the patient as well as establishing an assessment and plan that was discussed with the patient.  > 50% of time was spent in direct patient care.  Signed, Addison Naegeli. Audie Box, MD, [place]  [address], [place], [place] [address] [phone]  02/21/2021 9:14 AM

## 2021-02-21 ENCOUNTER — Ambulatory Visit (INDEPENDENT_AMBULATORY_CARE_PROVIDER_SITE_OTHER): Payer: Managed Care, Other (non HMO) | Admitting: Cardiovascular Disease

## 2021-02-21 ENCOUNTER — Other Ambulatory Visit (HOSPITAL_COMMUNITY): Payer: Self-pay

## 2021-02-21 ENCOUNTER — Other Ambulatory Visit: Payer: Self-pay

## 2021-02-21 ENCOUNTER — Encounter: Payer: Self-pay | Admitting: Cardiovascular Disease

## 2021-02-21 VITALS — BP 138/80 | HR 71 | Ht 69.0 in | Wt 326.8 lb

## 2021-02-21 DIAGNOSIS — I493 Ventricular premature depolarization: Secondary | ICD-10-CM | POA: Diagnosis not present

## 2021-02-21 DIAGNOSIS — I2510 Atherosclerotic heart disease of native coronary artery without angina pectoris: Secondary | ICD-10-CM

## 2021-02-21 DIAGNOSIS — E782 Mixed hyperlipidemia: Secondary | ICD-10-CM

## 2021-02-21 MED ORDER — ROSUVASTATIN CALCIUM 20 MG PO TABS
20.0000 mg | ORAL_TABLET | Freq: Every day | ORAL | 1 refills | Status: DC
Start: 2021-02-21 — End: 2021-08-16
  Filled 2021-02-21: qty 90, 90d supply, fill #0

## 2021-02-21 NOTE — Patient Instructions (Signed)
Medication Instructions:  Increase Crestor to 20 mg daily   *If you need a refill on your cardiac medications before your next appointment, please call your pharmacy*   Follow-Up: At [facility], you and your health needs are our priority.  As part of our continuing mission to provide you with exceptional heart care, we have created designated Provider Care Teams.  These Care Teams include your primary Cardiologist (physician) and Advanced Practice Providers (APPs -  Physician Assistants and Nurse Practitioners) who all work together to provide you with the care you need, when you need it.  We recommend signing up for the patient portal called "MyChart".  Sign up information is provided on this After Visit Summary.  MyChart is used to connect with patients for Virtual Visits (Telemedicine).  Patients are able to view lab/test results, encounter notes, upcoming appointments, etc.  Non-urgent messages can be sent to your provider as well.   To learn more about what you can do with MyChart, go to [email or web address].    Your next appointment:   12 month(s)  The format for your next appointment:   In Person  Provider:   Eleonore Chiquito, MD or Sande Rives, PA-C, or Almyra Deforest, [place]

## 2021-02-27 ENCOUNTER — Other Ambulatory Visit (HOSPITAL_COMMUNITY): Payer: Self-pay

## 2021-03-01 NOTE — Progress Notes (Signed)
[facility] [place] [address] [place] [place] Phone: [phone] Subjective:   [name], am serving as a scribe for Dr. Hulan Saas. This visit occurred during the SARS-CoV-2 public health emergency.  Safety protocols were in place, including screening questions prior to the visit, additional usage of staff PPE, and extensive cleaning of exam room while observing appropriate contact time as indicated for disinfecting solutions.   I'm seeing this patient by the request  of:  [name], MD  CC: bilateral knee pain   [id number]  01/04/2021 Chronic, with exacerbation.  Patient does have instability noted.  Encouraged to lose weight.  Patient knows he needs a BMI under 40 to truly have knee replaced patient otherwise feels the steroid injections do better than the viscosupplementation.  We will continue to consider though the viscosupplementation if we keep using steroids too often.  Follow-up again in 8 to 10 weeks  Update 03/07/2021 SULAYMAN MANNING is a 66 y.o. male coming in with complaint of B knee pain. Patient states here for injections. Pain started to get a little more bothersome. No other complaints.       Past Medical History:  Diagnosis Date   Appendicitis    Blood in stool    Coronary artery disease 1997   s/p PCI by Dr. Glade Lloyd   Diverticulosis of colon with hemorrhage 2009   Dyslipidemia, goal LDL below 70    Edema    Encounter for long-term (current) use of other medications    Essential hypertension, benign    Family history of thyroid disease    Frequent unifocal PVCs    GBS (Guillain Barre syndrome) ([facility])    Morbid obesity ([facility])    Myalgia and myositis, unspecified    Prostate cancer ([place]) 2012   Pulmonary HTN ([place]) 08/14/2018   PASP 29mmHg by echo 2017   Sleep apnea with use of continuous positive airway pressure (CPAP)    Thrombocytopenia ([place]) 05/25/2014   Type II or unspecified type diabetes mellitus  without mention of complication, not stated as uncontrolled    Past Surgical History:  Procedure Laterality Date   ANGIOPLASTY  1997   Dr. Glade Lloyd   APPENDECTOMY     CATARACT EXTRACTION Left 05/18/2020   CATARACT EXTRACTION Right 06/08/2020   radioactive seed prostate     Social History   Socioeconomic History   Marital status: Married    Spouse name: Not on file   Number of children: 0   Years of education: 12   Highest education level: High school graduate  Occupational History   Occupation: Scientist, research (medical): [employer]    Comment: mod/heavy lifting  Tobacco Use   Smoking status: Never   Smokeless tobacco: Never  Vaping Use   Vaping Use: Never used  Substance and Sexual Activity   Alcohol use: No   Drug use: No   Sexual activity: Not on file  Other Topics Concern   Not on file  Social History Narrative   Lives at home with his wife.   Right-handed.   Caffeine: occasional use.   Social Determinants of Health   Financial Resource Strain: Not on file  Food Insecurity: Not on file  Transportation Needs: Not on file  Physical Activity: Not on file  Stress: Not on file  Social Connections: Not on file   Allergies  Allergen Reactions   Crestor [Rosuvastatin]     Muscle Pain    Lipitor [Atorvastatin]  Other (See Comments)    Arm weakness    Family History  Problem Relation Age of Onset   CVA Father    Hypertension Father    Diabetes Mother    Hypothyroidism Sister    Hypertension Sister     Current Outpatient Medications (Endocrine & Metabolic):    JANUVIA 604 MG tablet, TAKE 1 TABLET BY MOUTH EVERY DAY   metFORMIN (GLUCOPHAGE) 500 MG tablet, TAKE 2 TABLETS BY MOUTH 2 TIMES DAILY WITH A MEAL.  Current Outpatient Medications (Cardiovascular):    amLODipine (NORVASC) 10 MG tablet, TAKE 1 TABLET BY MOUTH EVERY DAY   fenofibrate 160 MG tablet, TAKE 1 TABLET BY MOUTH EVERY DAY   furosemide (LASIX) 20 MG tablet, TAKE 1 TO 2 TABLETS BY MOUTH  TWICE A DAY AS NEEDED FOR EDEMA (Patient not taking: Reported on 02/21/2021)   lisinopril (ZESTRIL) 40 MG tablet, TAKE 1 TABLET BY MOUTH EVERY DAY   metoprolol succinate (TOPROL-XL) 100 MG 24 hr tablet, Take 1 tablet (100 mg total) by mouth daily.   rosuvastatin (CRESTOR) 20 MG tablet, Take 1 tablet (20 mg total) by mouth daily.   sildenafil (VIAGRA) 100 MG tablet, Take 0.5-1 tablets (50-100 mg total) by mouth daily as needed for erectile dysfunction.  Current Outpatient Medications (Respiratory):    albuterol (VENTOLIN HFA) 108 (90 Base) MCG/ACT inhaler, TAKE 2 PUFFS BY MOUTH EVERY 6 HOURS AS NEEDED FOR WHEEZE OR SHORTNESS OF BREATH (Patient not taking: Reported on 02/21/2021)  Current Outpatient Medications (Analgesics):    acetaminophen (TYLENOL) 500 MG tablet, Take 1,000 mg by mouth every 6 (six) hours as needed for moderate pain. Taking 650 mg arthritis BID   aspirin EC 81 MG tablet, Take 81 mg by mouth daily.   celecoxib (CELEBREX) 200 MG capsule, Take 1 capsule (200 mg total) by mouth 2 (two) times daily.  Current Outpatient Medications (Hematological):    Cyanocobalamin (VITAMIN B-12 PO), Take 1,000 mcg by mouth.   Current Outpatient Medications (Other):    APPLE CIDER VINEGAR PO, Take 450 mg by mouth.   Ascorbic Acid (VITAMIN C) 500 MG CAPS, Take by mouth.   Blood Glucose Monitoring Suppl (FREESTYLE LITE) DEVI, Use to test sugars twice daily.Dx E11.9   buPROPion (WELLBUTRIN XL) 150 MG 24 hr tablet, Take 1 tablet (150 mg total) by mouth daily.   Cholecalciferol (VITAMIN D3 PO), Take 2,000 Units by mouth daily.    Coconut Oil 1000 MG CAPS, Take by mouth.   FREESTYLE LITE test strip, USE AS INSTRUCTED TO TEST SUGARS TWICE DAILY. DX. E11.9   gabapentin (NEURONTIN) 300 MG capsule, TAKE 1 CAPSULE BY MOUTH THREE TIMES A DAY   Iron-Vitamins (GERITOL PO), Take by mouth.   ketorolac (ACULAR) 0.5 % ophthalmic solution, Place 1 drop into the right eye 4 (four) times daily beginning after surgery  (Patient not taking: Reported on 02/21/2021)   Lancets (FREESTYLE) lancets, Use as instructed to test sugars twice daily. Dx. E11.9   magnesium gluconate (MAGONATE) 500 MG tablet, Take 500 mg by mouth daily.    milk thistle 175 MG tablet, Take 175 mg by mouth daily.   Misc Natural Products (TART CHERRY ADVANCED PO), Take 1,200 mg by mouth daily.    Omega-3 Fatty Acids (SALMON OIL-1000 PO), Take by mouth.   Potassium 99 MG TABS, Take 99 mg by mouth.   prednisoLONE acetate (PRED FORTE) 1 % ophthalmic suspension, Place 1 drop into the operative eye 4 (four) times daily. Start after surgery. (Patient not taking:  Reported on 02/21/2021)   traZODone (DESYREL) 100 MG tablet, TAKE 1 TABLET BY MOUTH EVERYDAY AT BEDTIME   Turmeric Curcumin 500 MG CAPS, Take by mouth.   zinc gluconate 50 MG tablet, Take 50 mg by mouth daily.    Review of Systems:  No headache, visual changes, nausea, vomiting, diarrhea, constipation, dizziness, abdominal pain, skin rash, fevers, chills, night sweats, weight loss, swollen lymph nodes, body aches, joint swelling, chest pain, shortness of breath, mood changes. POSITIVE muscle aches  Objective  Blood pressure 118/84, pulse 78, height 5\' 9"  (1.753 m), weight (!) 325 lb (147.4 kg), SpO2 94 %.   General: No apparent distress alert and oriented x3 mood and affect normal, dressed appropriately.  Morbidly obese HEENT: Pupils equal, extraocular movements intact  Respiratory: Patient's speak in full sentences and does not appear short of breath  Cardiovascular: No lower extremity edema, non tender, no erythema  Gait antalgic gait  MSK:   Bilateral knee-bilateral knee exam does have effusion noted of the left knee.  Limited range of motion in all planes.  Instability noted with valgus and varus force.  Mostly tender over the medial joint line left greater than right.  After informed written and verbal consent, patient was seated on exam table. Right knee was prepped with alcohol  swab and utilizing anterolateral approach, patient's right knee space was injected with 4:1  marcaine 0.5%: Kenalog 40mg /dL. Patient tolerated the procedure well without immediate complications.  After informed written and verbal consent, patient was seated on exam table. Left knee was prepped with alcohol swab and utilizing anterolateral approach, patient's left knee space was injected with 4:1  marcaine 0.5%: Kenalog 40mg /dL. Patient tolerated the procedure well without immediate complications.   Impression and Recommendations:     The above documentation has been reviewed and is accurate and complete Lyndal Pulley, DO

## 2021-03-06 ENCOUNTER — Other Ambulatory Visit: Payer: Self-pay

## 2021-03-06 ENCOUNTER — Encounter: Payer: Self-pay | Admitting: Podiatry

## 2021-03-06 ENCOUNTER — Ambulatory Visit: Payer: Managed Care, Other (non HMO) | Admitting: Podiatry

## 2021-03-06 DIAGNOSIS — E119 Type 2 diabetes mellitus without complications: Secondary | ICD-10-CM | POA: Diagnosis not present

## 2021-03-06 DIAGNOSIS — B351 Tinea unguium: Secondary | ICD-10-CM

## 2021-03-06 DIAGNOSIS — M79675 Pain in left toe(s): Secondary | ICD-10-CM

## 2021-03-06 DIAGNOSIS — M79674 Pain in right toe(s): Secondary | ICD-10-CM

## 2021-03-06 DIAGNOSIS — G629 Polyneuropathy, unspecified: Secondary | ICD-10-CM

## 2021-03-06 NOTE — Progress Notes (Signed)
This patient returns to my office for at risk foot care.  This patient requires this care by a professional since this patient will be at risk due to having type 2 diabetes.  This patient is unable to cut nails himself since the patient cannot reach his nails.These nails are painful walking and wearing shoes.  This patient presents for at risk foot care today.  General Appearance  Alert, conversant and in no acute stress.  Vascular  Dorsalis pedis and posterior tibial  pulses are palpable  bilaterally.  Capillary return is within normal limits  bilaterally. Temperature is within normal limits  bilaterally.  Neurologic  Senn-Weinstein monofilament wire test within normal limits  bilaterally. Muscle power within normal limits bilaterally.  Nails Thick disfigured discolored nails with subungual debris  from hallux to fifth toes bilaterally. No evidence of bacterial infection or drainage bilaterally.  Orthopedic  No limitations of motion  feet .  No crepitus or effusions noted.  HAV  B/L.  Hammer toes  B/L.  Skin  normotropic skin with no porokeratosis noted bilaterally.  No signs of infections or ulcers noted.     Onychomycosis  Pain in right toes  Pain in left toes  Consent was obtained for treatment procedures.   Mechanical debridement of nails 1-5  bilaterally performed with a nail nipper.  Filed with dremel without incident.    Return office visit    3 months                  Told patient to return for periodic foot care and evaluation due to potential at risk complications.   Ihsan Nomura DPM   

## 2021-03-07 ENCOUNTER — Other Ambulatory Visit: Payer: Self-pay | Admitting: Family Medicine

## 2021-03-07 ENCOUNTER — Ambulatory Visit (INDEPENDENT_AMBULATORY_CARE_PROVIDER_SITE_OTHER): Payer: Managed Care, Other (non HMO) | Admitting: Family Medicine

## 2021-03-07 VITALS — BP 118/84 | HR 78 | Ht 69.0 in | Wt 325.0 lb

## 2021-03-07 DIAGNOSIS — M2550 Pain in unspecified joint: Secondary | ICD-10-CM | POA: Diagnosis not present

## 2021-03-07 DIAGNOSIS — M170 Bilateral primary osteoarthritis of knee: Secondary | ICD-10-CM | POA: Diagnosis not present

## 2021-03-07 NOTE — Assessment & Plan Note (Signed)
Encourage weight loss which will be good for all his joints and other comorbidities.

## 2021-03-07 NOTE — Assessment & Plan Note (Signed)
Bilateral injections given again for this chronic problem with exacerbation.  Encourage patient to continue to work on his weight loss goals.  Patient continues to lose weight every time we have seen him recently and encouraged him just to continue to do so.  We discussed icing regimen still, patient did not respond as well to viscosupplementation.  We have discussed before about the possibility of PRP.  Follow-up with me again in 10 weeks otherwise.

## 2021-03-07 NOTE — Patient Instructions (Signed)
Injected both knees Pulled fluid out Good to see you! See you again in 10 weeks

## 2021-03-08 LAB — SYNOVIAL FLUID ANALYSIS, COMPLETE
Basophils, %: 0 %
Eosinophils-Synovial: 0 % (ref 0–2)
Lymphocytes-Synovial Fld: 62 % (ref 0–74)
Monocyte/Macrophage: 30 % (ref 0–69)
Neutrophil, Synovial: 8 % (ref 0–24)
Synoviocytes, %: 0 % (ref 0–15)
WBC, Synovial: 396 cells/uL — ABNORMAL HIGH (ref <150)

## 2021-03-09 ENCOUNTER — Other Ambulatory Visit (HOSPITAL_COMMUNITY): Payer: Self-pay

## 2021-03-20 ENCOUNTER — Other Ambulatory Visit (HOSPITAL_COMMUNITY): Payer: Self-pay

## 2021-03-30 IMAGING — CT CT ANGIO CHEST
2 series · 19 of 32 positions shown · IV contrast (APPLIED)
Comparison: No prior CT for comparison

CLINICAL DATA: 64-year-old male with a history of prior spinal cord
injury with upper extremity weakness.

EXAM:
CT ANGIOGRAPHY CHEST WITH CONTRAST
TECHNIQUE: Multidetector CT imaging of the chest was performed using the
standard protocol during bolus administration of intravenous
contrast. Multiplanar CT image reconstructions and MIPs were
obtained to evaluate the vascular anatomy.
CONTRAST:  75mL NORRNX-ZVU IOPAMIDOL (NORRNX-ZVU) INJECTION 76%

[Series 4: chest angio · axial · 0.83mm/px · z∈[+404,+719]mm · 11 of 127 slices shown]
[im 11/127  lung]
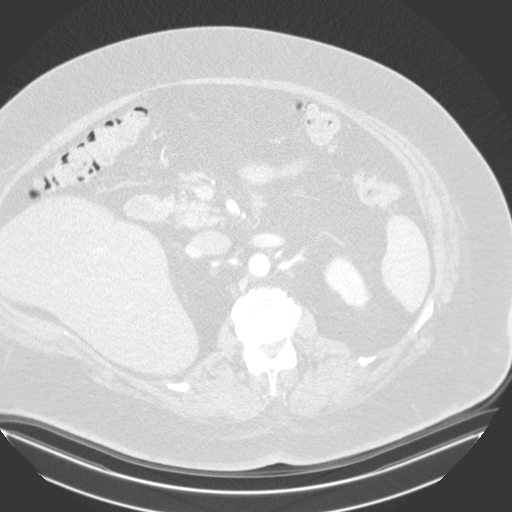
[im 22/127  soft-tissue]
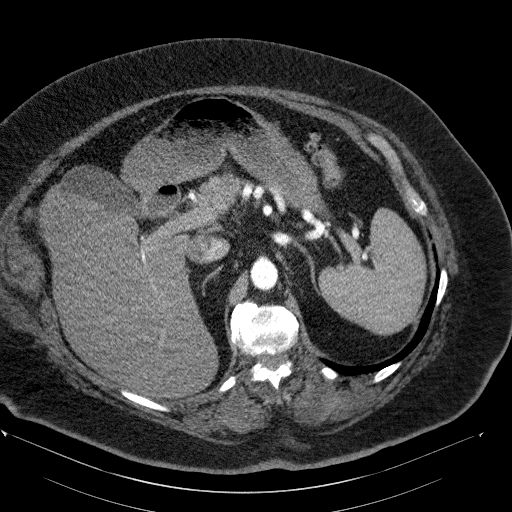
[im 32/127  lung]
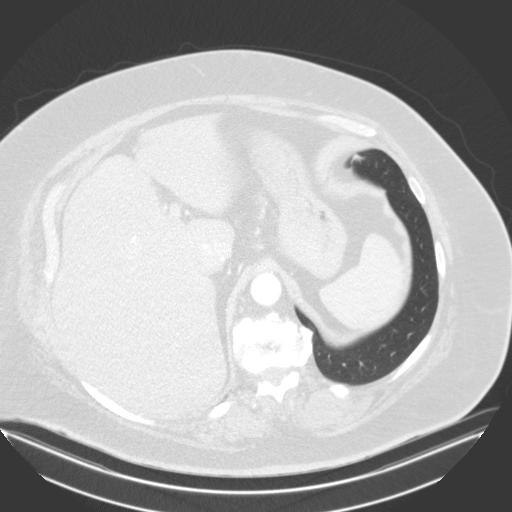
[im 43/127  soft-tissue]
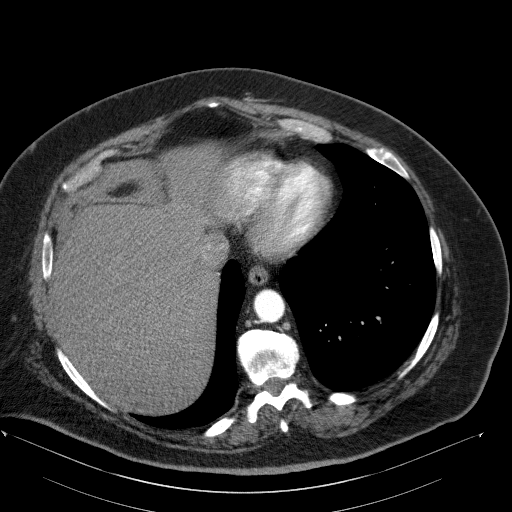
[im 53/127  lung]
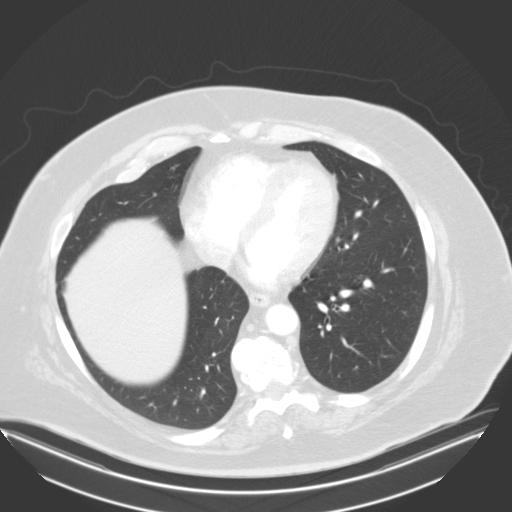
[im 64/127  soft-tissue]
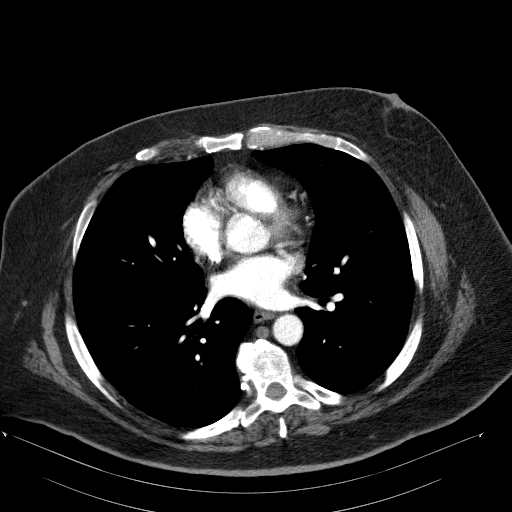
[im 74/127  lung]
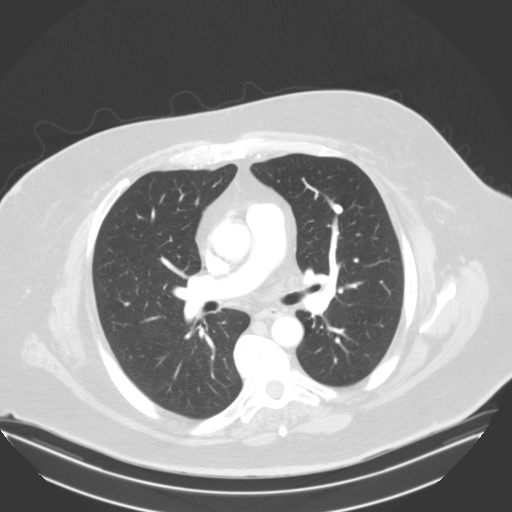
[im 85/127  soft-tissue]
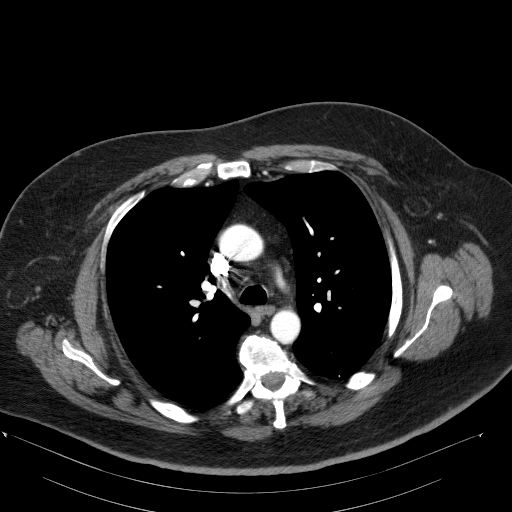
[im 95/127  lung]
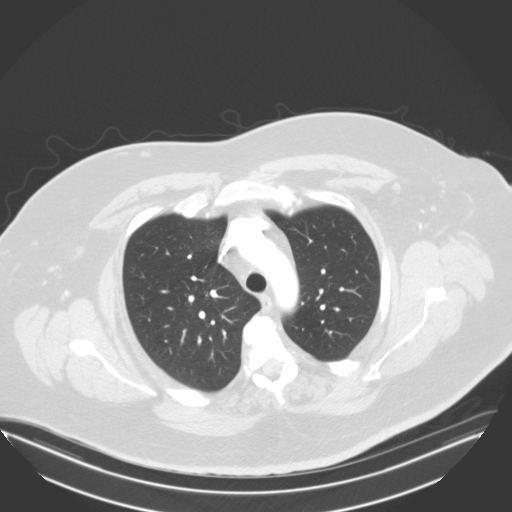
[im 106/127  soft-tissue]
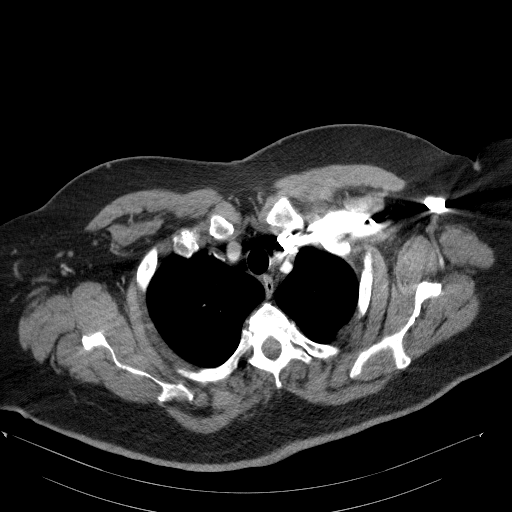
[im 116/127  lung]
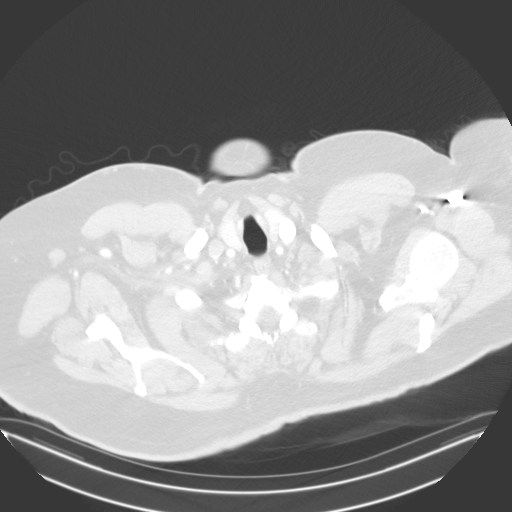

[Series 7: lung · axial · 0.83mm/px · z∈[+418,+710]mm · 8 of 188 slices shown]
[im 21/188  soft-tissue]
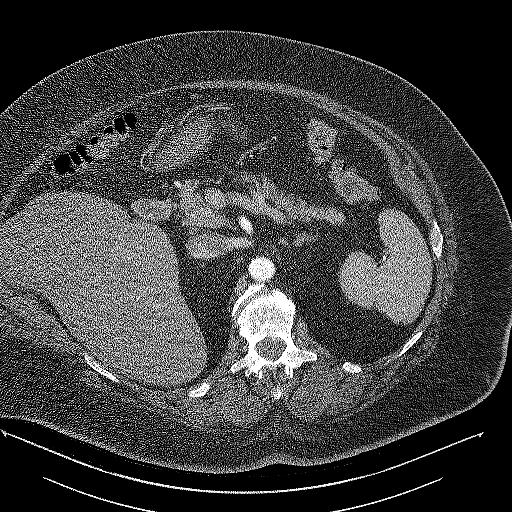
[im 42/188  soft-tissue]
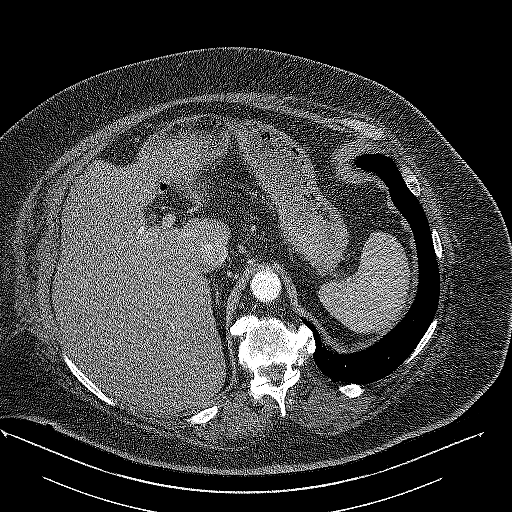
[im 63/188  soft-tissue]
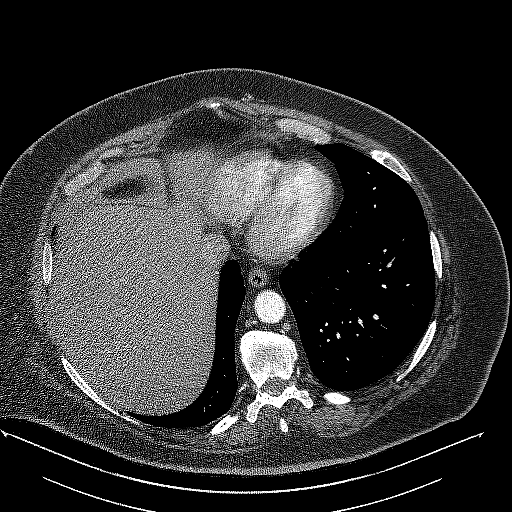
[im 84/188  soft-tissue]
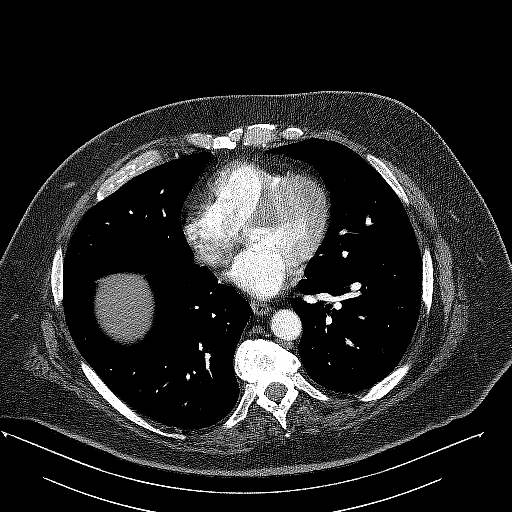
[im 104/188  soft-tissue]
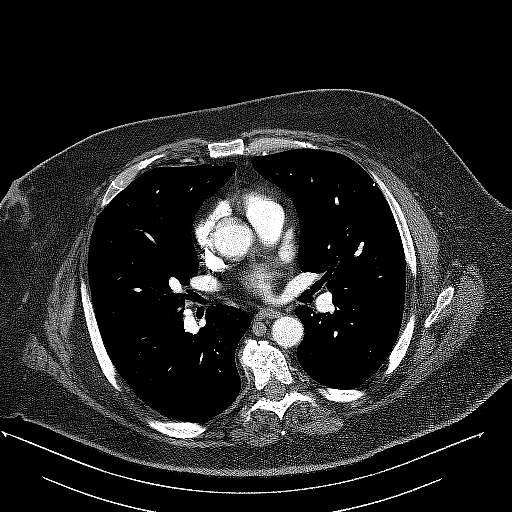
[im 125/188  soft-tissue]
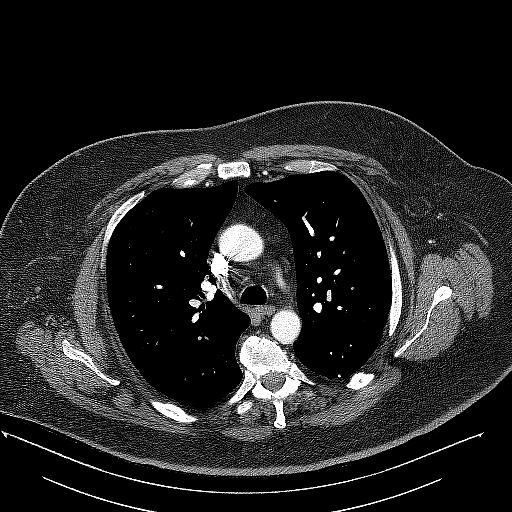
[im 146/188  soft-tissue]
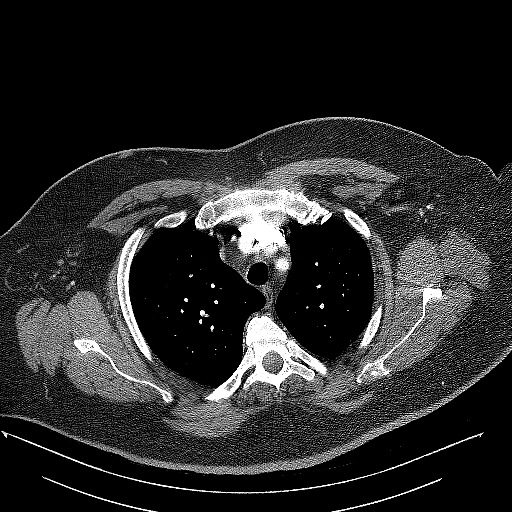
[im 167/188  soft-tissue]
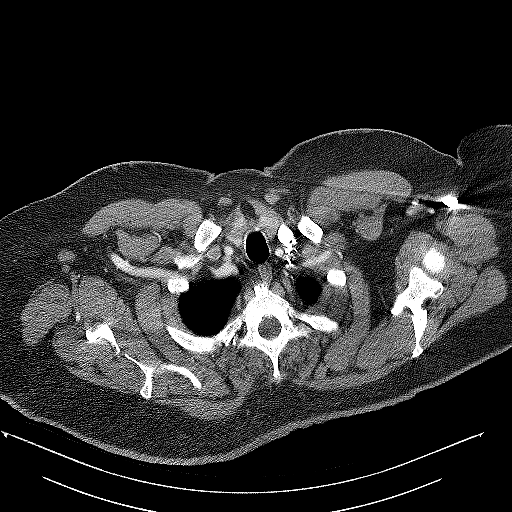

[19 of 32 positions shown; findings below may reference images not displayed]

FINDINGS: Cardiovascular: Heart size within normal limits. No pericardial
fluid/thickening. Minimal native coronary calcifications.

Normal course caliber and contour of the thoracic aorta. Branch
vessels are patent. No significant atherosclerotic changes of the
thoracic aorta. No significant valve calcifications.

Timing of the contrast bolus not optimized for the pulmonary
arteries, however, no proximal filling defects identified. Main
pulmonary artery diameter unremarkable.

Mediastinum/Nodes: No mediastinal adenopathy. Unremarkable thoracic
inlet. Unremarkable thoracic esophagus.

Lungs/Pleura: No pneumothorax. No pleural effusion. Central airways
are clear. No confluent airspace disease. No interlobular septal
thickening. Partially calcified granuloma at the periphery of the
left lower lobe partially calcified granuloma at the periphery of
the right upper lobe.

Upper Abdomen: No acute finding of the upper abdomen. Decreased
attenuation of liver parenchyma.

Musculoskeletal: Accentuated kyphotic curvature of the thoracic
spine. Degenerative changes throughout the visualized lower
cervical, thoracic, lumbar spine. No bony canal narrowing. No acute
fracture. Vertebral body heights relatively maintained. Flowing
anterior osteophyte formation throughout the visualized thoracic
spine.

Review of the MIP images confirms the above findings.
IMPRESSION: No acute CT finding of the chest.

Minimal coronary artery disease.

Steatosis.

## 2021-04-18 ENCOUNTER — Other Ambulatory Visit (HOSPITAL_COMMUNITY): Payer: Self-pay

## 2021-04-19 ENCOUNTER — Encounter: Payer: Self-pay | Admitting: Family Medicine

## 2021-04-19 ENCOUNTER — Ambulatory Visit (INDEPENDENT_AMBULATORY_CARE_PROVIDER_SITE_OTHER): Payer: Managed Care, Other (non HMO) | Admitting: Family Medicine

## 2021-04-19 VITALS — BP 114/76 | HR 65 | Temp 98.0°F | Resp 16 | Wt 319.4 lb

## 2021-04-19 DIAGNOSIS — E1169 Type 2 diabetes mellitus with other specified complication: Secondary | ICD-10-CM | POA: Diagnosis not present

## 2021-04-19 DIAGNOSIS — F32A Depression, unspecified: Secondary | ICD-10-CM | POA: Diagnosis not present

## 2021-04-19 DIAGNOSIS — E785 Hyperlipidemia, unspecified: Secondary | ICD-10-CM

## 2021-04-19 DIAGNOSIS — E119 Type 2 diabetes mellitus without complications: Secondary | ICD-10-CM

## 2021-04-19 DIAGNOSIS — I10 Essential (primary) hypertension: Secondary | ICD-10-CM | POA: Diagnosis not present

## 2021-04-19 LAB — HEPATIC FUNCTION PANEL
ALT: 29 U/L (ref 0–53)
AST: 33 U/L (ref 0–37)
Albumin: 4.5 g/dL (ref 3.5–5.2)
Alkaline Phosphatase: 46 U/L (ref 39–117)
Bilirubin, Direct: 0.2 mg/dL (ref 0.0–0.3)
Total Bilirubin: 0.6 mg/dL (ref 0.2–1.2)
Total Protein: 6.7 g/dL (ref 6.0–8.3)

## 2021-04-19 LAB — BASIC METABOLIC PANEL
BUN: 17 mg/dL (ref 6–23)
CO2: 25 mEq/L (ref 19–32)
Calcium: 9.8 mg/dL (ref 8.4–10.5)
Chloride: 102 mEq/L (ref 96–112)
Creatinine, Ser: 0.77 mg/dL (ref 0.40–1.50)
GFR: 94.37 mL/min (ref >60.00)
Glucose, Bld: 128 mg/dL — ABNORMAL HIGH (ref 70–99)
Potassium: 4.1 mEq/L (ref 3.5–5.1)
Sodium: 137 mEq/L (ref 135–145)

## 2021-04-19 LAB — LIPID PANEL
Cholesterol: 151 mg/dL (ref 0–200)
HDL: 33 mg/dL — ABNORMAL LOW (ref >39.00)
NonHDL: 117.9
Total CHOL/HDL Ratio: 5
Triglycerides: 285 mg/dL — ABNORMAL HIGH (ref 0.0–149.0)
VLDL: 57 mg/dL — ABNORMAL HIGH (ref 0.0–40.0)

## 2021-04-19 LAB — CBC WITH DIFFERENTIAL/PLATELET
Basophils Absolute: 0.1 10*3/uL (ref 0.0–0.1)
Basophils Relative: 0.8 % (ref 0.0–3.0)
Eosinophils Absolute: 0 10*3/uL (ref 0.0–0.7)
Eosinophils Relative: 0 % (ref 0.0–5.0)
HCT: 43.7 % (ref 39.0–52.0)
Hemoglobin: 14.6 g/dL (ref 13.0–17.0)
Lymphocytes Relative: 9.4 % — ABNORMAL LOW (ref 12.0–46.0)
Lymphs Abs: 0.8 10*3/uL (ref 0.7–4.0)
MCHC: 33.4 g/dL (ref 30.0–36.0)
MCV: 89.5 fl (ref 78.0–100.0)
Monocytes Absolute: 0.6 10*3/uL (ref 0.1–1.0)
Monocytes Relative: 6.4 % (ref 3.0–12.0)
Neutro Abs: 7.3 10*3/uL (ref 1.4–7.7)
Neutrophils Relative %: 83.4 % — ABNORMAL HIGH (ref 43.0–77.0)
Platelets: 175 10*3/uL (ref 150.0–400.0)
RBC: 4.88 Mil/uL (ref 4.22–5.81)
RDW: 14.9 % (ref 11.5–15.5)
WBC: 8.8 10*3/uL (ref 4.0–10.5)

## 2021-04-19 LAB — LDL CHOLESTEROL, DIRECT: Direct LDL: 88 mg/dL

## 2021-04-19 LAB — MICROALBUMIN / CREATININE URINE RATIO
Creatinine,U: 113.7 mg/dL
Microalb Creat Ratio: 0.7 mg/g (ref 0.0–30.0)
Microalb, Ur: 0.8 mg/dL (ref 0.0–1.9)

## 2021-04-19 LAB — HEMOGLOBIN A1C: Hgb A1c MFr Bld: 6.6 % — ABNORMAL HIGH (ref 4.6–6.5)

## 2021-04-19 LAB — TSH: TSH: 1.26 u[IU]/mL (ref 0.35–5.50)

## 2021-04-19 NOTE — Assessment & Plan Note (Signed)
Chronic problem.  Currently well controlled on Amlodipine '10mg'$  daily and Lisinopril '40mg'$  daily.  Check labs due to ACE but no anticipated med changes.  Will follow. ?

## 2021-04-19 NOTE — Assessment & Plan Note (Signed)
Chronic problem.  Hx of adequate control on Januvia '100mg'$  daily, Metformin '1000mg'$  BID.  UTD on eye exam- due this month.  UTD on foot exam.  On ACE for renal protection but will get microalbumin per insurance requirements.  Currently asymptomatic w/ exception of known neuropathy.  Check labs.  Adjust meds prn  ?

## 2021-04-19 NOTE — Progress Notes (Signed)
? ?  Subjective:  ? ? Patient ID: Travis [name], male    DOB: 06-27-1955, 66 y.o.   MRN: [id number] ? ?HPI ?DM- chronic problem.  Pt is down 6 lbs since last visit.  On Januvia '100mg'$  daily, Metformin '1000mg'$  BID.  UTD on eye exam (due this month), foot exam.  On ACE but Medicare now requires microalbumin.  Denies CP.  Has rare wheezing/SOB.  Denies HAs above baseline, visual changes.  No symptomatic lows.  + neuropathy in feet. ? ?HTN- chronic problem, on Amlodipine '10mg'$  daily, Lisinopril '40mg'$  daily w/ good control ? ?Hyperlipidemia- chronic problem, on Fenofibrate '160mg'$  daily, Crestor '20mg'$  daily.  Denies abd pain, N/V. ? ?Depression- started on Wellbutrin '150mg'$  daily at last visit.  Pt reports mood is 'a lot better'.  Less irritable. ? ? ?Review of Systems ?For ROS see HPI  ?   ?Objective:  ? Physical Exam ?Vitals reviewed.  ?Constitutional:   ?   General: He is not in acute distress. ?   Appearance: Normal appearance. He is well-developed. He is obese. He is not ill-appearing.  ?HENT:  ?   Head: Normocephalic and atraumatic.  ?Eyes:  ?   Extraocular Movements: Extraocular movements intact.  ?   Conjunctiva/sclera: Conjunctivae normal.  ?   Pupils: Pupils are equal, round, and reactive to light.  ?Neck:  ?   Thyroid: No thyromegaly.  ?Cardiovascular:  ?   Rate and Rhythm: Normal rate and regular rhythm.  ?   Pulses: Normal pulses.  ?   Heart sounds: Normal heart sounds. No murmur heard. ?Pulmonary:  ?   Effort: Pulmonary effort is normal. No respiratory distress.  ?   Breath sounds: Normal breath sounds.  ?Abdominal:  ?   General: Bowel sounds are normal. There is no distension.  ?   Palpations: Abdomen is soft.  ?Musculoskeletal:  ?   Cervical back: Normal range of motion and neck supple.  ?   Right lower leg: No edema.  ?   Left lower leg: No edema.  ?Lymphadenopathy:  ?   Cervical: No cervical adenopathy.  ?Skin: ?   General: Skin is warm and dry.  ?Neurological:  ?   General: No focal deficit present.  ?   Mental  Status: He is alert and oriented to person, place, and time.  ?   Cranial Nerves: No cranial nerve deficit.  ?Psychiatric:     ?   Mood and Affect: Mood normal.     ?   Behavior: Behavior normal.  ? ? ? ? ? ?   ?Assessment & Plan:  ? ? ?

## 2021-04-19 NOTE — Patient Instructions (Signed)
Follow up in 3-4 months to recheck diabetes ?We'll notify you of your lab results and make any changes if needed ?Continue to work on low carb diet and get regular physical activity (like walking) ?Have your eye doctor send me a copy of their report ?Call with any questions or concerns ?Happy Early [place]!!! ?

## 2021-04-19 NOTE — Assessment & Plan Note (Signed)
Chronic problem.  Currently on Fenofibrate '160mg'$  daily and Crestor '20mg'$  daily w/o difficulty.  Check labs.  Adjust meds prn  ?

## 2021-04-19 NOTE — Assessment & Plan Note (Signed)
Improved since starting Wellbutrin at last visit.  No med changes at this time.  Will follow. ?

## 2021-04-20 ENCOUNTER — Telehealth: Payer: Self-pay

## 2021-04-20 NOTE — Telephone Encounter (Signed)
Patient aware of labs.  

## 2021-04-20 NOTE — Telephone Encounter (Signed)
-----   Message from [name], MD sent at 04/20/2021  7:35 AM EDT ----- ?Labs are stable and look good!  No changes at this time ?

## 2021-05-08 ENCOUNTER — Other Ambulatory Visit: Payer: Self-pay | Admitting: Cardiovascular Disease

## 2021-05-08 ENCOUNTER — Other Ambulatory Visit (HOSPITAL_COMMUNITY): Payer: Self-pay

## 2021-05-08 MED ORDER — METOPROLOL SUCCINATE ER 100 MG PO TB24
100.0000 mg | ORAL_TABLET | Freq: Every day | ORAL | 0 refills | Status: DC
  Filled 2021-05-08: qty 90, 90d supply, fill #0

## 2021-05-09 ENCOUNTER — Other Ambulatory Visit (HOSPITAL_COMMUNITY): Payer: Self-pay

## 2021-05-11 NOTE — Progress Notes (Signed)
?Charlann Boxer D.O. ?[facility] ?[place] ?Phone: [phone] ?Subjective:   ?I, Jacqualin Combes, am serving as a scribe for Dr. Hulan Saas. ? ?This visit occurred during the SARS-CoV-2 public health emergency.  Safety protocols were in place, including screening questions prior to the visit, additional usage of staff PPE, and extensive cleaning of exam room while observing appropriate contact time as indicated for disinfecting solutions.  ? ?I'm seeing this patient by the request  of:  [name], MD ? ?CC: Bilateral knee pain ? ?[id number]  ?03/07/2021 ?Bilateral injections given again for this chronic problem with exacerbation.  Encourage patient to continue to work on his weight loss goals.  Patient continues to lose weight every time we have seen him recently and encouraged him just to continue to do so.  We discussed icing regimen still, patient did not respond as well to viscosupplementation.  We have discussed before about the possibility of PRP.  Follow-up with me again in 10 weeks otherwise. ? ?Update 05/23/2021 ?Travis [name] is a 66 y.o. male coming in with complaint of B knee pain. Patient states that he feels that he is needing injections as both knees are hurting.  ? ? ? ?  ? ?Past Medical History:  ?Diagnosis Date  ? Appendicitis   ? Blood in stool   ? Coronary artery disease 1997  ? s/p PCI by Dr. Glade Lloyd  ? Diverticulosis of colon with hemorrhage 2009  ? Dyslipidemia, goal LDL below 70   ? Edema   ? Encounter for long-term (current) use of other medications   ? Essential hypertension, benign   ? Family history of thyroid disease   ? Frequent unifocal PVCs   ? GBS (Guillain Barre syndrome) ([place])   ? Morbid obesity ([place])   ? Myalgia and myositis, unspecified   ? Prostate cancer [facility]) 2012  ? Pulmonary HTN ([place]) 08/14/2018  ? PASP 24mHg by echo 2017  ? Sleep apnea with use of continuous positive airway pressure (CPAP)   ? [place]  ? Type II or unspecified type diabetes mellitus without mention of complication, not stated as uncontrolled   ? ?Past Surgical History:  ?Procedure Laterality Date  ? ANGIOPLASTY  1997  ? Dr. TGlade Lloyd ? APPENDECTOMY    ? CATARACT EXTRACTION Left 05/18/2020  ? CATARACT EXTRACTION Right 06/08/2020  ? radioactive seed prostate    ? ?Social History  ? ?Socioeconomic History  ? Marital status: Married  ?  Spouse name: Not on file  ? Number of children: 0  ? Years of education: 120 ? Highest education level: High school graduate  ?Occupational History  ? Occupation: lSystems analyst ?  Employer: [employer]  ?  Comment: mod/heavy lifting  ?Tobacco Use  ? Smoking status: Never  ? Smokeless tobacco: Never  ?Vaping Use  ? Vaping Use: Never used  ?Substance and Sexual Activity  ? Alcohol use: No  ? Drug use: No  ? Sexual activity: Not on file  ?Other Topics Concern  ? Not on file  ?Social History Narrative  ? Lives at home with his wife.  ? Right-handed.  ? Caffeine: occasional use.  ? ?Social Determinants of Health  ? ?Financial Resource Strain: Not on file  ?Food Insecurity: Not on file  ?Transportation Needs: Not on file  ?Physical Activity: Not on file  ?Stress: Not on file  ?Social Connections: Not on file  ? ?Allergies  ?Allergen Reactions  ?  Crestor [Rosuvastatin]   ?  Muscle Pain ?  ? Lipitor [Atorvastatin] Other (See Comments)  ?  Arm weakness ?  ? ?Family History  ?Problem Relation Age of Onset  ? CVA Father   ? Hypertension Father   ? Diabetes Mother   ? Hypothyroidism Sister   ? Hypertension Sister   ? ? ?Current Outpatient Medications (Endocrine & Metabolic):  ?  JANUVIA 100 MG tablet, TAKE 1 TABLET BY MOUTH EVERY DAY ?  metFORMIN (GLUCOPHAGE) 500 MG tablet, TAKE 2 TABLETS BY MOUTH 2 TIMES DAILY WITH A MEAL. ? ?Current Outpatient Medications (Cardiovascular):  ?  amLODipine (NORVASC) 10 MG tablet, TAKE 1 TABLET BY MOUTH EVERY DAY ?  fenofibrate 160 MG tablet, TAKE 1 TABLET BY MOUTH EVERY DAY ?   furosemide (LASIX) 20 MG tablet, TAKE 1 TO 2 TABLETS BY MOUTH TWICE A DAY AS NEEDED FOR EDEMA ?  lisinopril (ZESTRIL) 40 MG tablet, TAKE 1 TABLET BY MOUTH EVERY DAY ?  metoprolol succinate (TOPROL-XL) 100 MG 24 hr tablet, Take 1 tablet (100 mg total) by mouth daily. ?  rosuvastatin (CRESTOR) 20 MG tablet, Take 1 tablet (20 mg total) by mouth daily. ?  sildenafil (VIAGRA) 100 MG tablet, Take 0.5-1 tablets (50-100 mg total) by mouth daily as needed for erectile dysfunction. ? ? ?Current Outpatient Medications (Analgesics):  ?  acetaminophen (TYLENOL) 500 MG tablet, Take 1,000 mg by mouth every 6 (six) hours as needed for moderate pain. Taking 650 mg arthritis BID ?  aspirin EC 81 MG tablet, Take 81 mg by mouth daily. ?  celecoxib (CELEBREX) 200 MG capsule, Take 1 capsule (200 mg total) by mouth 2 (two) times daily. ? ?Current Outpatient Medications (Hematological):  ?  Cyanocobalamin (VITAMIN B-12 PO), Take 1,000 mcg by mouth.  ? ?Current Outpatient Medications (Other):  ?  APPLE CIDER VINEGAR PO, Take 450 mg by mouth. ?  Ascorbic Acid (VITAMIN C) 500 MG CAPS, Take by mouth. ?  Blood Glucose Monitoring Suppl (FREESTYLE LITE) DEVI, Use to test sugars twice daily.Dx E11.9 ?  buPROPion (WELLBUTRIN XL) 150 MG 24 hr tablet, Take 1 tablet (150 mg total) by mouth daily. ?  Cholecalciferol (VITAMIN D3 PO), Take 2,000 Units by mouth daily.  ?  Coconut Oil 1000 MG CAPS, Take by mouth. ?  FREESTYLE LITE test strip, USE AS INSTRUCTED TO TEST SUGARS TWICE DAILY. DX. E11.9 ?  gabapentin (NEURONTIN) 300 MG capsule, TAKE 1 CAPSULE BY MOUTH THREE TIMES A DAY ?  Iron-Vitamins (GERITOL PO), Take by mouth. ?  Lancets (FREESTYLE) lancets, Use as instructed to test sugars twice daily. Dx. E11.9 ?  magnesium gluconate (MAGONATE) 500 MG tablet, Take 500 mg by mouth daily.  ?  milk thistle 175 MG tablet, Take 175 mg by mouth daily. ?  Misc Natural Products (TART CHERRY ADVANCED PO), Take 1,200 mg by mouth daily.  ?  Omega-3 Fatty Acids  (SALMON OIL-1000 PO), Take by mouth. ?  Potassium 99 MG TABS, Take 99 mg by mouth. ?  traZODone (DESYREL) 100 MG tablet, TAKE 1 TABLET BY MOUTH EVERYDAY AT BEDTIME ?  Turmeric Curcumin 500 MG CAPS, Take by mouth. ?  zinc gluconate 50 MG tablet, Take 50 mg by mouth daily. ? ? ?Reviewed prior external information including notes and imaging from  ?primary care provider ?As well as notes that were available from care everywhere and other healthcare systems. ? ?Past medical history, social, surgical and family history all reviewed in electronic medical record.  No pertanent  information unless stated regarding to the chief complaint.  ? ?Review of Systems: ? No headache, visual changes, nausea, vomiting, diarrhea, constipation, dizziness, abdominal pain, skin rash, fevers, chills, night sweats, weight loss, swollen lymph nodes, body aches, joint swelling, chest pain, shortness of breath, mood changes. POSITIVE muscle aches ? ?Objective  ?Blood pressure 122/76, pulse 73, height '5\' 9"'$  (1.753 m), weight (!) 309 lb (140.2 kg), SpO2 96 %. ?  ?General: No apparent distress alert and oriented x3 mood and affect normal, dressed appropriately.  ?HEENT: Pupils equal, extraocular movements intact  ?Respiratory: Patient's speak in full sentences and does not appear short of breath  ?Cardiovascular: 2+ pitting edema ?Antalgic using a rolling walker ?Knees have trace effusion noted bilaterally.  Tender to palpation.  Instability noted with valgus and varus force.  Lacks last 5 degrees of flexion and extension bilaterally ? ?After informed written and verbal consent, patient was seated on exam table. Right knee was prepped with alcohol swab and utilizing anterolateral approach, patient's right knee space was injected with 4:1  marcaine 0.5%: Kenalog '40mg'$ /dL. Patient tolerated the procedure well without immediate complications. ? ?After informed written and verbal consent, patient was seated on exam table. Left knee was prepped with  alcohol swab and utilizing anterolateral approach, patient's left knee space was injected with 4:1  marcaine 0.5%: Kenalog '40mg'$ /dL. Patient tolerated the procedure well without immediate complications. ?  ?Impression

## 2021-05-16 ENCOUNTER — Other Ambulatory Visit: Payer: Self-pay | Admitting: Family Medicine

## 2021-05-16 MED ORDER — BUPROPION HCL ER (XL) 150 MG PO TB24: 150.0000 mg | ORAL_TABLET | Freq: Every day | ORAL | 3 refills | Status: DC

## 2021-05-16 NOTE — Progress Notes (Signed)
Refill sent per pt's request ?

## 2021-05-18 ENCOUNTER — Other Ambulatory Visit (HOSPITAL_COMMUNITY): Payer: Self-pay

## 2021-05-19 ENCOUNTER — Other Ambulatory Visit: Payer: Self-pay | Admitting: Family Medicine

## 2021-05-22 ENCOUNTER — Other Ambulatory Visit: Payer: Self-pay | Admitting: Family Medicine

## 2021-05-23 ENCOUNTER — Ambulatory Visit (INDEPENDENT_AMBULATORY_CARE_PROVIDER_SITE_OTHER): Payer: Medicare Other | Admitting: Family Medicine

## 2021-05-23 DIAGNOSIS — I2510 Atherosclerotic heart disease of native coronary artery without angina pectoris: Secondary | ICD-10-CM

## 2021-05-23 DIAGNOSIS — M170 Bilateral primary osteoarthritis of knee: Secondary | ICD-10-CM | POA: Diagnosis not present

## 2021-05-23 NOTE — Assessment & Plan Note (Signed)
Chronic, with exacerbation.  Discussed with patient about possible general: Process, which activities to do which ones to avoid, increase activity slowly.  Discussed icing regimen and home exercises.  Patient has lost some weight since we have seen him last week and we encouraged him to continue to do so.  Follow-up with me again 8 to 10 weeks. ?

## 2021-05-23 NOTE — Patient Instructions (Signed)
Congrats on your progress ?Injected both knees today ?See me again in 8-10 weeks ?

## 2021-06-01 ENCOUNTER — Other Ambulatory Visit: Payer: Self-pay | Admitting: Family Medicine

## 2021-06-08 ENCOUNTER — Other Ambulatory Visit: Payer: Self-pay | Admitting: Family Medicine

## 2021-06-13 ENCOUNTER — Other Ambulatory Visit: Payer: Self-pay | Admitting: Family Medicine

## 2021-06-28 ENCOUNTER — Encounter: Payer: Self-pay | Admitting: Podiatry

## 2021-06-28 ENCOUNTER — Ambulatory Visit (INDEPENDENT_AMBULATORY_CARE_PROVIDER_SITE_OTHER): Payer: Medicare Other | Admitting: Podiatry

## 2021-06-28 DIAGNOSIS — E119 Type 2 diabetes mellitus without complications: Secondary | ICD-10-CM

## 2021-06-28 DIAGNOSIS — M79674 Pain in right toe(s): Secondary | ICD-10-CM | POA: Diagnosis not present

## 2021-06-28 DIAGNOSIS — B351 Tinea unguium: Secondary | ICD-10-CM | POA: Diagnosis not present

## 2021-06-28 DIAGNOSIS — G629 Polyneuropathy, unspecified: Secondary | ICD-10-CM

## 2021-06-28 DIAGNOSIS — M79675 Pain in left toe(s): Secondary | ICD-10-CM | POA: Diagnosis not present

## 2021-06-28 NOTE — Progress Notes (Signed)
This patient returns to my office for at risk foot care.  This patient requires this care by a professional since this patient will be at risk due to having type 2 diabetes.  This patient is unable to cut nails himself since the patient cannot reach his nails.These nails are painful walking and wearing shoes.  This patient presents for at risk foot care today.  General Appearance  Alert, conversant and in no acute stress.  Vascular  Dorsalis pedis and posterior tibial  pulses are palpable  bilaterally.  Capillary return is within normal limits  bilaterally. Temperature is within normal limits  bilaterally.  Neurologic  Senn-Weinstein monofilament wire test within normal limits  bilaterally. Muscle power within normal limits bilaterally.  Nails Thick disfigured discolored nails with subungual debris  from hallux to fifth toes bilaterally. No evidence of bacterial infection or drainage bilaterally.  Orthopedic  No limitations of motion  feet .  No crepitus or effusions noted.  HAV  B/L.  Hammer toes  B/L.  Skin  normotropic skin with no porokeratosis noted bilaterally.  No signs of infections or ulcers noted.     Onychomycosis  Pain in right toes  Pain in left toes  Consent was obtained for treatment procedures.   Mechanical debridement of nails 1-5  bilaterally performed with a nail nipper.  Filed with dremel without incident.    Return office visit    3 months                  Told patient to return for periodic foot care and evaluation due to potential at risk complications.   Lamarcus Spira DPM   

## 2021-07-10 ENCOUNTER — Other Ambulatory Visit: Payer: Self-pay | Admitting: Family Medicine

## 2021-07-11 ENCOUNTER — Other Ambulatory Visit: Payer: Self-pay | Admitting: Family Medicine

## 2021-07-14 NOTE — Progress Notes (Signed)
[place] Phone: [phone] Subjective:   [name], am serving as a scribe for Dr. Hulan Saas.   I'm seeing this patient by the request  of:  [name], MD  CC: Bilateral knee pain      NIO:[id number]  05/23/2021 Chronic, with exacerbation.  Discussed with patient about possible general: Process, which activities to do which ones to avoid, increase activity slowly.  Discussed icing regimen and home exercises.  Patient has lost some weight since we have seen him last week and we encouraged him to continue to do so.  Follow-up with me again 8 to 10 weeks.   Update 07/26/2021 ARISTEO HANKERSON is a 66 y.o. male coming in with complaint of B knee pain. Patient states that his R knee is painful below patella. Did have some shin pain this past week in both legs.        Past Medical History:  Diagnosis Date   Appendicitis    Blood in stool    Coronary artery disease 1997   s/p PCI by Dr. Glade Lloyd   Diverticulosis of colon with hemorrhage 2009   Dyslipidemia, goal LDL below 70    Edema    Encounter for long-term (current) use of other medications    Essential hypertension, benign    Family history of thyroid disease    Frequent unifocal PVCs    GBS (Guillain Barre syndrome) (HCC)    Morbid obesity (HCC)    Myalgia and myositis, unspecified    Prostate cancer ([place]) 2012   Pulmonary HTN ([place]) 08/14/2018   PASP 79mHg by echo 2017   Sleep apnea with use of continuous positive airway pressure (CPAP)    Thrombocytopenia ([place] 05/25/2014   Type II or unspecified type diabetes mellitus without mention of complication, not stated as uncontrolled    Past Surgical History:  Procedure Laterality Date   ANGIOPLASTY  1997   Dr. TGlade Lloyd  APPENDECTOMY     CATARACT EXTRACTION Left 05/18/2020   CATARACT EXTRACTION Right 06/08/2020   radioactive seed prostate     Social History   Socioeconomic  History   Marital status: Married    Spouse name: Not on file   Number of children: 0   Years of education: 12   Highest education level: High school graduate  Occupational History   Occupation: lScientist, research (medical) [employer]    Comment: mod/heavy lifting  Tobacco Use   Smoking status: Never   Smokeless tobacco: Never  Vaping Use   Vaping Use: Never used  Substance and Sexual Activity   Alcohol use: No   Drug use: No   Sexual activity: Not on file  Other Topics Concern   Not on file  Social History Narrative   Lives at home with his wife.   Right-handed.   Caffeine: occasional use.   Social Determinants of Health   Financial Resource Strain: Not on file  Food Insecurity: Not on file  Transportation Needs: Not on file  Physical Activity: Not on file  Stress: Not on file  Social Connections: Not on file   Allergies  Allergen Reactions   Crestor [Rosuvastatin]     Muscle Pain    Lipitor [Atorvastatin] Other (See Comments)    Arm weakness    Family History  Problem Relation Age of Onset   CVA Father    Hypertension Father    Diabetes Mother  Hypothyroidism Sister    Hypertension Sister     Current Outpatient Medications (Endocrine & Metabolic):    JANUVIA 831 MG tablet, TAKE 1 TABLET BY MOUTH EVERY DAY   metFORMIN (GLUCOPHAGE) 500 MG tablet, TAKE 2 TABLETS BY MOUTH 2 TIMES DAILY WITH A MEAL.  Current Outpatient Medications (Cardiovascular):    amLODipine (NORVASC) 10 MG tablet, TAKE 1 TABLET BY MOUTH EVERY DAY   fenofibrate 160 MG tablet, TAKE 1 TABLET BY MOUTH EVERY DAY   furosemide (LASIX) 20 MG tablet, TAKE 1 TO 2 TABLETS BY MOUTH TWICE A DAY AS NEEDED FOR EDEMA   lisinopril (ZESTRIL) 40 MG tablet, TAKE 1 TABLET BY MOUTH EVERY DAY   metoprolol succinate (TOPROL-XL) 100 MG 24 hr tablet, Take 1 tablet (100 mg total) by mouth daily.   rosuvastatin (CRESTOR) 20 MG tablet, Take 1 tablet (20 mg total) by mouth daily.   sildenafil (VIAGRA) 100 MG  tablet, Take 0.5-1 tablets (50-100 mg total) by mouth daily as needed for erectile dysfunction.  Current Outpatient Medications (Respiratory):    albuterol (VENTOLIN HFA) 108 (90 Base) MCG/ACT inhaler, SMARTSIG:2 Puff(s) By Mouth Every 6 Hours PRN  Current Outpatient Medications (Analgesics):    acetaminophen (TYLENOL) 500 MG tablet, Take 1,000 mg by mouth every 6 (six) hours as needed for moderate pain. Taking 650 mg arthritis BID   aspirin EC 81 MG tablet, Take 81 mg by mouth daily.   celecoxib (CELEBREX) 200 MG capsule, TAKE 1 CAPSULE BY MOUTH TWICE A DAY  Current Outpatient Medications (Hematological):    Cyanocobalamin (VITAMIN B-12 PO), Take 1,000 mcg by mouth.   Current Outpatient Medications (Other):    APPLE CIDER VINEGAR PO, Take 450 mg by mouth.   Ascorbic Acid (VITAMIN C) 500 MG CAPS, Take by mouth.   Blood Glucose Monitoring Suppl (FREESTYLE LITE) DEVI, Use to test sugars twice daily.Dx E11.9   buPROPion (WELLBUTRIN XL) 150 MG 24 hr tablet, TAKE 1 TABLET BY MOUTH EVERY DAY   Cholecalciferol (VITAMIN D3 PO), Take 2,000 Units by mouth daily.    Coconut Oil 1000 MG CAPS, Take by mouth.   FREESTYLE LITE test strip, USE AS INSTRUCTED TO TEST SUGARS TWICE DAILY. DX. E11.9   gabapentin (NEURONTIN) 300 MG capsule, TAKE 1 CAPSULE BY MOUTH THREE TIMES A DAY   Iron-Vitamins (GERITOL PO), Take by mouth.   Lancets (FREESTYLE) lancets, Use as instructed to test sugars twice daily. Dx. E11.9   magnesium gluconate (MAGONATE) 500 MG tablet, Take 500 mg by mouth daily.    milk thistle 175 MG tablet, Take 175 mg by mouth daily.   Misc Natural Products (TART CHERRY ADVANCED PO), Take 1,200 mg by mouth daily.    Omega-3 Fatty Acids (SALMON OIL-1000 PO), Take by mouth.   Potassium 99 MG TABS, Take 99 mg by mouth.   traZODone (DESYREL) 100 MG tablet, TAKE 1 TABLET BY MOUTH EVERYDAY AT BEDTIME   Turmeric Curcumin 500 MG CAPS, Take by mouth.   zinc gluconate 50 MG tablet, Take 50 mg by mouth  daily.   Reviewed prior external information including notes and imaging from  primary care provider As well as notes that were available from care everywhere and other healthcare systems.  Past medical history, social, surgical and family history all reviewed in electronic medical record.  No pertanent information unless stated regarding to the chief complaint.   Review of Systems:  No headache, visual changes, nausea, vomiting, diarrhea, constipation, dizziness, abdominal pain, skin rash, fevers, chills, night sweats, weight  loss, swollen lymph nodes, body aches, joint swelling, chest pain, shortness of breath, mood changes. POSITIVE muscle aches  Objective  Blood pressure 128/84, pulse 70, height '5\' 9"'$  (1.753 m), weight (!) 313 lb (142 kg), SpO2 97 %.   General: No apparent distress alert and oriented x3 mood and affect normal, dressed appropriately. Overweight  HEENT: Pupils equal, extraocular movements intact  Respiratory: Patient's speak in full sentences and does not appear short of breath  Cardiovascular: 1+ lower extremity edema, non tender, no erythema  Bilateral knees do have some asymmetry noted.  Instability with valgus and varus force noted.  Trace effusion noted bilaterally.  Limited range of motion secondary to body habitus  After informed written and verbal consent, patient was seated on exam table. Right knee was prepped with alcohol swab and utilizing anterolateral approach, patient's right knee space was injected with 4:1  marcaine 0.5%: Kenalog '40mg'$ /dL. Patient tolerated the procedure well without immediate complications.  After informed written and verbal consent, patient was seated on exam table. Left knee was prepped with alcohol swab and utilizing anterolateral approach, patient's left knee space was injected with 4:1  marcaine 0.5%: Kenalog '40mg'$ /dL. Patient tolerated the procedure well without immediate complications.    Impression and Recommendations:    The  above documentation has been reviewed and is accurate and complete Lyndal Pulley, DO

## 2021-07-20 ENCOUNTER — Ambulatory Visit: Payer: Managed Care, Other (non HMO) | Admitting: Family Medicine

## 2021-07-26 ENCOUNTER — Ambulatory Visit (INDEPENDENT_AMBULATORY_CARE_PROVIDER_SITE_OTHER): Payer: Medicare Other | Admitting: Family Medicine

## 2021-07-26 ENCOUNTER — Encounter: Payer: Self-pay | Admitting: Family Medicine

## 2021-07-26 DIAGNOSIS — I2510 Atherosclerotic heart disease of native coronary artery without angina pectoris: Secondary | ICD-10-CM

## 2021-07-26 DIAGNOSIS — M170 Bilateral primary osteoarthritis of knee: Secondary | ICD-10-CM | POA: Diagnosis not present

## 2021-07-26 NOTE — Patient Instructions (Signed)
Injections today See me in 8-10 weeks

## 2021-07-26 NOTE — Assessment & Plan Note (Signed)
Chronic problem with worsening symptoms again.  Patient has started to increase weight again.  Discussed the importance of keeping his weight under control.  Patient has lost over 100 pounds over the course of time here recently.  Encouraged him to continue to do so so patient could be potentially a candidate for surgical intervention.  Otherwise can continue to repeat injections every 8 to 10 weeks if necessary.

## 2021-07-31 ENCOUNTER — Other Ambulatory Visit: Payer: Self-pay | Admitting: Family Medicine

## 2021-08-03 ENCOUNTER — Ambulatory Visit (INDEPENDENT_AMBULATORY_CARE_PROVIDER_SITE_OTHER): Payer: Medicare Other | Admitting: Family Medicine

## 2021-08-03 ENCOUNTER — Other Ambulatory Visit: Payer: Self-pay

## 2021-08-03 ENCOUNTER — Encounter: Payer: Self-pay | Admitting: Family Medicine

## 2021-08-03 ENCOUNTER — Telehealth: Payer: Self-pay | Admitting: Cardiology

## 2021-08-03 VITALS — BP 128/80 | HR 62 | Temp 98.2°F | Resp 17 | Ht 69.0 in | Wt 317.1 lb

## 2021-08-03 DIAGNOSIS — I2510 Atherosclerotic heart disease of native coronary artery without angina pectoris: Secondary | ICD-10-CM

## 2021-08-03 DIAGNOSIS — E119 Type 2 diabetes mellitus without complications: Secondary | ICD-10-CM | POA: Diagnosis not present

## 2021-08-03 LAB — BASIC METABOLIC PANEL
BUN: 18 mg/dL (ref 6–23)
CO2: 27 mEq/L (ref 19–32)
Calcium: 9.7 mg/dL (ref 8.4–10.5)
Chloride: 101 mEq/L (ref 96–112)
Creatinine, Ser: 0.75 mg/dL (ref 0.40–1.50)
GFR: 94.93 mL/min (ref >60.00)
Glucose, Bld: 146 mg/dL — ABNORMAL HIGH (ref 70–99)
Potassium: 4.5 mEq/L (ref 3.5–5.1)
Sodium: 138 mEq/L (ref 135–145)

## 2021-08-03 LAB — HEMOGLOBIN A1C: Hgb A1c MFr Bld: 6.6 % — ABNORMAL HIGH (ref 4.6–6.5)

## 2021-08-03 MED ORDER — METOPROLOL SUCCINATE ER 100 MG PO TB24: 100.0000 mg | ORAL_TABLET | Freq: Every day | ORAL | 2 refills | Status: DC

## 2021-08-03 NOTE — Patient Instructions (Signed)
Follow up in 3-4 months to recheck sugars, cholesterol, and BP We'll notify you of your lab results and make any changes if needed Continue to work on low carb diet and walk as you are able Have your eye doctor send me their report Call with any questions or concerns Stay Safe!  Stay Healthy! Have a great summer!!!

## 2021-08-03 NOTE — Telephone Encounter (Signed)
Medication refilled and sent to desired pharmacy. Patient notified.

## 2021-08-03 NOTE — Assessment & Plan Note (Signed)
Chronic problem.  Pt reports home CBGs are running 130-150 so he may not need the Januvia.  UTD on foot exam, microalbumin.  Eye exam scheduled.  Check labs.  Adjust meds prn

## 2021-08-03 NOTE — Telephone Encounter (Signed)
*  STAT* If patient is at the pharmacy, call can be transferred to refill team.   1. Which medications need to be refilled? (please list name of each medication and dose if known)   metoprolol succinate (TOPROL-XL) 100 MG 24 hr tablet  2. Which pharmacy/location (including street and city if local pharmacy) is medication to be sent to?  [id number] [place] - [address]  3. Do they need a 30 day or 90 day supply?   90 day   Patient stated he has a few pills left.

## 2021-08-03 NOTE — Progress Notes (Signed)
   Subjective:    Patient ID: Travis [name], male    DOB: 1955-07-20, 66 y.o.   MRN: [id number]  HPI DM- chronic problem, UTD on foot exam, microalbumin.  Eye exam scheduled.  Currently on Metformin  '1000mg'$  BID.  Not taking Januvia due to cost.  Last A1C 6.6%  Denies CP, SOB, HAs, visual changes, abd pain, N/V.  Denies symptomatic lows.  Home CBGs 130s-150s   Review of Systems For ROS see HPI     Objective:   Physical Exam Vitals reviewed.  Constitutional:      General: He is not in acute distress.    Appearance: Normal appearance. He is well-developed. He is obese. He is not ill-appearing.  HENT:     Head: Normocephalic and atraumatic.  Eyes:     Extraocular Movements: Extraocular movements intact.     Conjunctiva/sclera: Conjunctivae normal.     Pupils: Pupils are equal, round, and reactive to light.  Neck:     Thyroid: No thyromegaly.  Cardiovascular:     Rate and Rhythm: Normal rate and regular rhythm.     Pulses: Normal pulses.     Heart sounds: Normal heart sounds. No murmur heard. Pulmonary:     Effort: Pulmonary effort is normal. No respiratory distress.     Breath sounds: Normal breath sounds.  Abdominal:     General: Bowel sounds are normal. There is no distension.     Palpations: Abdomen is soft.  Musculoskeletal:     Cervical back: Normal range of motion and neck supple.     Right lower leg: No edema.     Left lower leg: No edema.  Lymphadenopathy:     Cervical: No cervical adenopathy.  Skin:    General: Skin is warm and dry.  Neurological:     General: No focal deficit present.     Mental Status: He is alert and oriented to person, place, and time.     Cranial Nerves: No cranial nerve deficit.  Psychiatric:        Mood and Affect: Mood normal.        Behavior: Behavior normal.           Assessment & Plan:

## 2021-08-04 ENCOUNTER — Telehealth: Payer: Self-pay

## 2021-08-04 NOTE — Telephone Encounter (Signed)
-----   Message from [name], MD sent at 08/04/2021  7:40 AM EDT ----- Labs look great!  No changes at this time

## 2021-08-04 NOTE — Telephone Encounter (Signed)
Informed pt of lab results  

## 2021-08-16 ENCOUNTER — Other Ambulatory Visit: Payer: Self-pay | Admitting: Family Medicine

## 2021-08-26 ENCOUNTER — Other Ambulatory Visit: Payer: Self-pay | Admitting: Family Medicine

## 2021-09-15 ENCOUNTER — Other Ambulatory Visit: Payer: Self-pay | Admitting: Family Medicine

## 2021-09-26 NOTE — Progress Notes (Signed)
HPI- Male never smoker followed for OSA, complicated by morbid obesity, CAD/MI/PTCA, HBP, DM, massive hernia NPSG  10/14/14 AHI 124.2/ hr, CPAP to 16, desat to 69%, weight 390 lbs  ===========================================================  09/28/20- 66 year old male never smoker followed for OSA, complicated by Morbid Obesity, CAD/MI/PTCA, HTN,  DM, Pulm HTN, Diverticulosis, DM2, Hyperlipidemia, Neuropathy, Hx Guillain Barre Syndrome, Osteoarthritis, hx Prostate Cancer,  -albuterol hfa,  CPAP 16/[employer]  AirSense 10>> today order 10-20 Download- as of 04/20/20- compliance 100%, AHI 3.3/ hr Body weight today-331 lbs Covid vax-none ( hx GB ) Needs replacement old machine with auto 10-20 as ordered 9/7 Breathing stable without acute events. Rare need albuterol inhaler. Abdominal bulge previously considered hiatal hernia, he now says was some sort of cyst, not resected and persists.   09/28/21- 66 year old male never smoker followed for OSA, complicated by Morbid Obesity, CAD/MI/PTCA, HTN,  DM, Pulm HTN, Diverticulosis, DM2, Hyperlipidemia, Neuropathy, Hx Guillain Barre Syndrome, Osteoarthritis, hx Prostate Cancer,  -albuterol hfa,  CPAP auto 10-20 / [employer]  [place]  Download compliance-90%, AHI 2.9/ hr Body weight today-317 lbs Covid vax-none ( hx GB ) Flu vax- declines He avoids all vaccinations due to hx Guillain Barre. Reports comfortable and sleeping well with CPAP. Occasional fretful night. PCP gave Trazodone to help insomnia. We discussed reading if unable to sleep.  ROS-see HPI   + = positive Constitutional:    weight loss, night sweats, fevers, chills, fatigue, lassitude. HEENT:    headaches, difficulty swallowing, tooth/dental problems, sore throat,       sneezing, itching, ear ache, + nasal congestion, post nasal drip, snoring CV:    chest pain, orthopnea, PND, swelling in lower extremities, anasarca,                                                       dizziness, + palpitations Resp:    + shortness of breath with exertion or at rest.                productive cough,   non-productive cough, coughing up of blood.              change in color of mucus.  wheezing.   Skin:    rash or lesions. GI:  No-   heartburn, indigestion, abdominal pain, nausea, vomiting, diarrhea,                 change in bowel habits, loss of appetite GU: dysuria, change in color of urine, no urgency or frequency.   flank pain. MS:  + joint pain, stiffness, decreased range of motion, back pain. Neuro-     nothing unusual Psych:  change in mood or affect.  depression or anxiety.   memory loss.  OBJ- Physical Exam General- Alert, Oriented, Affect-appropriate, Distress- none acute, + morbidly obese Skin- rash-none, lesions- none, excoriation- none Lymphadenopathy- none Head- atraumatic            Eyes- Gross vision intact, PERRLA, conjunctivae and secretions clear            Ears- Hearing, canals-normal            Nose- Clear, no-Septal dev, mucus, polyps, erosion, perforation             Throat- Mallampati IV , mucosa clear , drainage- none, tonsils- atrophic Neck- flexible , trachea midline,  no stridor , thyroid nl, carotid no bruit Chest - symmetrical excursion , unlabored           Heart/CV- RRR , no murmur , no gallop  , no rub, nl s1 s2                           - JVD- none , edema- none, stasis changes- none, varices- none           Lung- clear to P&A, wheeze- none, cough- none , dullness-none, rub- none           Chest wall-  Abd- + massive pannus and significant obesity Br/ Gen/ Rectal- Not done, not indicated Extrem- +brace L wrist "arthritis" Neuro- grossly intact to observation

## 2021-09-28 ENCOUNTER — Ambulatory Visit (INDEPENDENT_AMBULATORY_CARE_PROVIDER_SITE_OTHER): Payer: Medicare Other | Admitting: Internal Medicine

## 2021-09-28 ENCOUNTER — Encounter: Payer: Self-pay | Admitting: Internal Medicine

## 2021-09-28 DIAGNOSIS — G4733 Obstructive sleep apnea (adult) (pediatric): Secondary | ICD-10-CM | POA: Diagnosis not present

## 2021-09-28 DIAGNOSIS — I2510 Atherosclerotic heart disease of native coronary artery without angina pectoris: Secondary | ICD-10-CM

## 2021-09-28 NOTE — Assessment & Plan Note (Signed)
No significant improvement. Keep working on diet/ exercise.

## 2021-09-28 NOTE — Patient Instructions (Signed)
We can continue CPAP auto 10-20  Please call if we can help 

## 2021-09-28 NOTE — Assessment & Plan Note (Signed)
Benefits from CPAP with good compliance and control Plan- continue auto 10-20 

## 2021-09-29 NOTE — Progress Notes (Unsigned)
[place] [address] [place] Phone: [phone] Subjective:   Travis [name], am serving as a scribe for Dr. Hulan Saas.  I'm seeing this patient by the request  of:  [name], MD  CC: Bilateral knee pain follow-up  [id number]  07/26/2021 Chronic problem with worsening symptoms again.  Patient has started to increase weight again.  Discussed the importance of keeping his weight under control.  Patient has lost over 100 pounds over the course of time here recently.  Encouraged him to continue to do so so patient could be potentially a candidate for surgical intervention.  Otherwise can continue to repeat injections every 8 to 10 weeks if necessary.  Update 10/04/2021 Travis [name] is a 66 y.o. male coming in with complaint of B knee pain. Patient states here for knee injections. No other complaints.       Past Medical History:  Diagnosis Date   Appendicitis    Blood in stool    Coronary artery disease 1997   s/p PCI by Dr. Glade Lloyd   Diverticulosis of colon with hemorrhage 2009   Dyslipidemia, goal LDL below 70    Edema    Encounter for long-term (current) use of other medications    Essential hypertension, benign    Family history of thyroid disease    Frequent unifocal PVCs    GBS (Guillain Barre syndrome) (HCC)    Morbid obesity (HCC)    Myalgia and myositis, unspecified    Prostate cancer ([place]) 2012   Pulmonary HTN ([place]) 08/14/2018   PASP 68mHg by echo 2017   Sleep apnea with use of continuous positive airway pressure (CPAP)    [place]rombocytopenia ([place] 05/25/2014   Type II or unspecified type diabetes mellitus without mention of complication, not stated as uncontrolled    Past Surgical History:  Procedure Laterality Date   ANGIOPLASTY  1997   Dr. 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   Right-handed.   Caffeine: occasional use.   Social Determinants of Health   Financial Resource Strain: Not on file  Food Insecurity: Not on file  Transportation Needs: Not on file  Physical Activity: Not on file  Stress: Not on file  Social Connections: Not on file   Allergies  Allergen Reactions   Crestor [Rosuvastatin]     Muscle Pain    Lipitor [Atorvastatin] Other (See Comments)    Arm weakness    Family History  Problem Relation Age of Onset   CVA Father    Hypertension Father    Diabetes Mother    Hypothyroidism Sister    Hypertension Sister  Current Outpatient Medications (Endocrine & Metabolic):    metFORMIN (GLUCOPHAGE) 500 MG tablet, TAKE 2 TABLETS BY MOUTH 2 TIMES DAILY WITH A MEAL.  Current Outpatient Medications (Cardiovascular):    amLODipine (NORVASC) 10 MG tablet, TAKE 1 TABLET BY MOUTH EVERY DAY   fenofibrate 160 MG tablet, TAKE 1 TABLET BY MOUTH EVERY DAY   furosemide (LASIX) 20 MG tablet, TAKE 1 TO 2 TABLETS BY MOUTH TWICE A DAY AS NEEDED FOR EDEMA   lisinopril (ZESTRIL) 40 MG tablet, TAKE 1 TABLET BY MOUTH EVERY DAY   metoprolol succinate (TOPROL-XL) 100 MG 24 hr tablet, Take 1 tablet (100 mg total) by mouth daily.   rosuvastatin (CRESTOR) 20 MG tablet, TAKE 1 TABLET BY MOUTH EVERY DAY   sildenafil (VIAGRA) 100 MG tablet, Take 0.5-1 tablets (50-100 mg total) by  mouth daily as needed for erectile dysfunction.  Current Outpatient Medications (Respiratory):    albuterol (VENTOLIN HFA) 108 (90 Base) MCG/ACT inhaler, SMARTSIG:2 Puff(s) By Mouth Every 6 Hours PRN  Current Outpatient Medications (Analgesics):    acetaminophen (TYLENOL) 500 MG tablet, Take 1,000 mg by mouth every 6 (six) hours as needed for moderate pain. Taking 650 mg arthritis BID   aspirin EC 81 MG tablet, Take 81 mg by mouth daily.   celecoxib (CELEBREX) 200 MG capsule, TAKE 1 CAPSULE BY MOUTH TWICE A DAY  Current Outpatient Medications (Hematological):    Cyanocobalamin (VITAMIN B-12 PO), Take 1,000 mcg by mouth.   Current Outpatient Medications (Other):    APPLE CIDER VINEGAR PO, Take 450 mg by mouth.   Ascorbic Acid (VITAMIN C) 500 MG CAPS, Take by mouth.   Blood Glucose Monitoring Suppl (FREESTYLE LITE) DEVI, Use to test sugars twice daily.Dx E11.9   buPROPion (WELLBUTRIN XL) 150 MG 24 hr tablet, TAKE 1 TABLET BY MOUTH EVERY DAY   Cholecalciferol (VITAMIN D3 PO), Take 2,000 Units by mouth daily.    Coconut Oil 1000 MG CAPS, Take by mouth.   FREESTYLE LITE test strip, USE AS INSTRUCTED TO TEST SUGARS TWICE DAILY. DX. E11.9   gabapentin (NEURONTIN) 300 MG capsule, TAKE 1 CAPSULE BY MOUTH THREE TIMES A DAY   Garlic 10 MG CAPS, Take by mouth.   glucosamine-chondroitin 500-400 MG tablet, Take 1 tablet by mouth 3 (three) times daily.   Iron-Vitamins (GERITOL PO), Take by mouth.   Lancets (FREESTYLE) lancets, Use as instructed to test sugars twice daily. Dx. E11.9   magnesium gluconate (MAGONATE) 500 MG tablet, Take 500 mg by mouth daily.    milk thistle 175 MG tablet, Take 175 mg by mouth daily.   Misc Natural Products (TART CHERRY ADVANCED PO), Take 1,200 mg by mouth daily.    Omega-3 Fatty Acids (SALMON OIL-1000 PO), Take by mouth.   Potassium 99 MG TABS, Take 99 mg by mouth.   traZODone (DESYREL) 100 MG tablet, TAKE 1 TABLET BY MOUTH EVERYDAY AT BEDTIME   Turmeric Curcumin 500  MG CAPS, Take by mouth.   zinc gluconate 50 MG tablet, Take 50 mg by mouth daily.   Reviewed prior external information including notes and imaging from  primary care provider As well as notes that were available from care everywhere and other healthcare systems.  Past medical history, social, surgical and family history all reviewed in electronic medical record.  No pertanent information unless stated regarding to the chief complaint.   Review of Systems:  No headache, visual changes, nausea, vomiting, diarrhea, constipation, dizziness, abdominal pain, skin rash, fevers, chills, night sweats, weight loss, swollen  lymph nodes,  joint swelling, chest pain, shortness of breath, mood changes. POSITIVE muscle aches, body aches   Objective  Blood pressure 132/80, pulse 78, height '5\' 8"'$  (1.727 m), SpO2 97 %.   General: No apparent distress alert and oriented x3 mood and affect normal, dressed appropriately.  HEENT: Pupils equal, extraocular movements intact  Respiratory: Patient's speak in full sentences and does not appear short of breath  Cardiovascular: No lower extremity edema, non tender, no erythema  Antalgic gait noted.  Using the aid of a walker Knee exam show the patient does have crepitus noted.  Instability with valgus and varus force.  No effusion noted on the left greater than the right.  After informed written and verbal consent, patient was seated on exam table. Right knee was prepped with alcohol swab and utilizing anterolateral approach, patient's right knee space was injected with 4:1  marcaine 0.5%: Kenalog '40mg'$ /dL. Patient tolerated the procedure well without immediate complications.  After informed written and verbal consent, patient was seated on exam table. Left knee was prepped with alcohol swab and utilizing anterolateral approach, patient's left knee space was injected with 4:1  marcaine 0.5%: Kenalog '40mg'$ /dL. Patient tolerated the procedure well without immediate  complications.    Impression and Recommendations:     The above documentation has been reviewed and is accurate and complete Lyndal Pulley, DO

## 2021-10-02 ENCOUNTER — Other Ambulatory Visit: Payer: Self-pay | Admitting: Family Medicine

## 2021-10-04 ENCOUNTER — Ambulatory Visit (INDEPENDENT_AMBULATORY_CARE_PROVIDER_SITE_OTHER): Payer: Medicare Other | Admitting: Family Medicine

## 2021-10-04 VITALS — BP 132/80 | HR 78 | Ht 68.0 in

## 2021-10-04 DIAGNOSIS — M170 Bilateral primary osteoarthritis of knee: Secondary | ICD-10-CM

## 2021-10-04 DIAGNOSIS — I2510 Atherosclerotic heart disease of native coronary artery without angina pectoris: Secondary | ICD-10-CM

## 2021-10-04 DIAGNOSIS — M2550 Pain in unspecified joint: Secondary | ICD-10-CM

## 2021-10-04 NOTE — Assessment & Plan Note (Signed)
Chronic problem with exacerbation.  Injections given again.  Secondary to patient's weight he is not a surgical candidate at this moment. Discussed with patient  Continuing to work on the loss of weight.  Patient did have some small effusion noted of the left knee and did do very mild aspiration done.  We will send to lab secondary to the consistency of the aspiration.  Patient will follow-up with me again otherwise in 8 to 10 weeks.

## 2021-10-04 NOTE — Patient Instructions (Signed)
Injections in knee today Drained left knee

## 2021-10-05 ENCOUNTER — Other Ambulatory Visit: Payer: Self-pay

## 2021-10-05 ENCOUNTER — Encounter: Payer: Self-pay | Admitting: Family Medicine

## 2021-10-05 LAB — SYNOVIAL FLUID ANALYSIS, COMPLETE
Basophils, %: 0 %
Eosinophils-Synovial: 0 % (ref 0–2)
Lymphocytes-Synovial Fld: 82 % — ABNORMAL HIGH (ref 0–74)
Monocyte/Macrophage: 4 % (ref 0–69)
Neutrophil, Synovial: 14 % (ref 0–24)
Synoviocytes, %: 0 % (ref 0–15)
WBC, Synovial: 553 cells/uL — ABNORMAL HIGH (ref <150)

## 2021-10-05 MED ORDER — ALLOPURINOL 100 MG PO TABS: 100.0000 mg | ORAL_TABLET | Freq: Every day | ORAL | 0 refills | Status: DC

## 2021-10-28 ENCOUNTER — Other Ambulatory Visit: Payer: Self-pay | Admitting: Family Medicine

## 2021-10-30 ENCOUNTER — Ambulatory Visit (INDEPENDENT_AMBULATORY_CARE_PROVIDER_SITE_OTHER): Payer: Medicare Other | Admitting: Podiatry

## 2021-10-30 ENCOUNTER — Encounter: Payer: Self-pay | Admitting: Podiatry

## 2021-10-30 DIAGNOSIS — M79674 Pain in right toe(s): Secondary | ICD-10-CM

## 2021-10-30 DIAGNOSIS — B351 Tinea unguium: Secondary | ICD-10-CM | POA: Diagnosis not present

## 2021-10-30 DIAGNOSIS — G629 Polyneuropathy, unspecified: Secondary | ICD-10-CM

## 2021-10-30 DIAGNOSIS — M79675 Pain in left toe(s): Secondary | ICD-10-CM | POA: Diagnosis not present

## 2021-10-30 DIAGNOSIS — E119 Type 2 diabetes mellitus without complications: Secondary | ICD-10-CM

## 2021-10-30 NOTE — Progress Notes (Signed)
This patient returns to my office for at risk foot care.  This patient requires this care by a professional since this patient will be at risk due to having type 2 diabetes.  This patient is unable to cut nails himself since the patient cannot reach his nails.These nails are painful walking and wearing shoes.  This patient presents for at risk foot care today.  General Appearance  Alert, conversant and in no acute stress.  Vascular  Dorsalis pedis and posterior tibial  pulses are palpable  bilaterally.  Capillary return is within normal limits  bilaterally. Temperature is within normal limits  bilaterally.  Neurologic  Senn-Weinstein monofilament wire test within normal limits  bilaterally. Muscle power within normal limits bilaterally.  Nails Thick disfigured discolored nails with subungual debris  from hallux to fifth toes bilaterally. No evidence of bacterial infection or drainage bilaterally.  Orthopedic  No limitations of motion  feet .  No crepitus or effusions noted.  HAV  B/L.  Hammer toes  B/L.  Skin  normotropic skin with no porokeratosis noted bilaterally.  No signs of infections or ulcers noted.     Onychomycosis  Pain in right toes  Pain in left toes  Consent was obtained for treatment procedures.   Mechanical debridement of nails 1-5  bilaterally performed with a nail nipper.  Filed with dremel without incident.    Return office visit    3 months                  Told patient to return for periodic foot care and evaluation due to potential at risk complications.   Kaizen Ibsen DPM   

## 2021-10-31 ENCOUNTER — Encounter: Payer: Self-pay | Admitting: Family Medicine

## 2021-10-31 ENCOUNTER — Ambulatory Visit (INDEPENDENT_AMBULATORY_CARE_PROVIDER_SITE_OTHER): Payer: Medicare Other | Admitting: Family Medicine

## 2021-10-31 VITALS — BP 130/68 | HR 71 | Temp 98.8°F | Resp 16 | Ht 68.0 in | Wt 308.1 lb

## 2021-10-31 DIAGNOSIS — E1169 Type 2 diabetes mellitus with other specified complication: Secondary | ICD-10-CM

## 2021-10-31 DIAGNOSIS — E785 Hyperlipidemia, unspecified: Secondary | ICD-10-CM

## 2021-10-31 DIAGNOSIS — I10 Essential (primary) hypertension: Secondary | ICD-10-CM

## 2021-10-31 DIAGNOSIS — E119 Type 2 diabetes mellitus without complications: Secondary | ICD-10-CM

## 2021-10-31 DIAGNOSIS — Z125 Encounter for screening for malignant neoplasm of prostate: Secondary | ICD-10-CM

## 2021-10-31 DIAGNOSIS — I2510 Atherosclerotic heart disease of native coronary artery without angina pectoris: Secondary | ICD-10-CM

## 2021-10-31 LAB — CBC WITH DIFFERENTIAL/PLATELET
Basophils Absolute: 0 10*3/uL (ref 0.0–0.1)
Basophils Relative: 0.3 % (ref 0.0–3.0)
Eosinophils Absolute: 0 10*3/uL (ref 0.0–0.7)
Eosinophils Relative: 0 % (ref 0.0–5.0)
HCT: 42.9 % (ref 39.0–52.0)
Hemoglobin: 13.9 g/dL (ref 13.0–17.0)
Lymphocytes Relative: 9.4 % — ABNORMAL LOW (ref 12.0–46.0)
Lymphs Abs: 1.2 10*3/uL (ref 0.7–4.0)
MCHC: 32.5 g/dL (ref 30.0–36.0)
MCV: 90.5 fl (ref 78.0–100.0)
Monocytes Absolute: 0.8 10*3/uL (ref 0.1–1.0)
Monocytes Relative: 6.2 % (ref 3.0–12.0)
Neutro Abs: 10.4 10*3/uL — ABNORMAL HIGH (ref 1.4–7.7)
Neutrophils Relative %: 84.1 % — ABNORMAL HIGH (ref 43.0–77.0)
Platelets: 211 10*3/uL (ref 150.0–400.0)
RBC: 4.74 Mil/uL (ref 4.22–5.81)
RDW: 14.8 % (ref 11.5–15.5)
WBC: 12.4 10*3/uL — ABNORMAL HIGH (ref 4.0–10.5)

## 2021-10-31 LAB — HEPATIC FUNCTION PANEL
ALT: 30 U/L (ref 0–53)
AST: 28 U/L (ref 0–37)
Albumin: 4.3 g/dL (ref 3.5–5.2)
Alkaline Phosphatase: 53 U/L (ref 39–117)
Bilirubin, Direct: 0.1 mg/dL (ref 0.0–0.3)
Total Bilirubin: 0.4 mg/dL (ref 0.2–1.2)
Total Protein: 7.2 g/dL (ref 6.0–8.3)

## 2021-10-31 LAB — BASIC METABOLIC PANEL
BUN: 20 mg/dL (ref 6–23)
CO2: 28 mEq/L (ref 19–32)
Calcium: 10.2 mg/dL (ref 8.4–10.5)
Chloride: 101 mEq/L (ref 96–112)
Creatinine, Ser: 0.77 mg/dL (ref 0.40–1.50)
GFR: 94.02 mL/min (ref >60.00)
Glucose, Bld: 129 mg/dL — ABNORMAL HIGH (ref 70–99)
Potassium: 4.3 mEq/L (ref 3.5–5.1)
Sodium: 137 mEq/L (ref 135–145)

## 2021-10-31 LAB — LIPID PANEL
Cholesterol: 129 mg/dL (ref 0–200)
HDL: 36.9 mg/dL — ABNORMAL LOW (ref >39.00)
LDL Cholesterol: 57 mg/dL (ref 0–99)
NonHDL: 92.51
Total CHOL/HDL Ratio: 4
Triglycerides: 180 mg/dL — ABNORMAL HIGH (ref 0.0–149.0)
VLDL: 36 mg/dL (ref 0.0–40.0)

## 2021-10-31 LAB — HEMOGLOBIN A1C: Hgb A1c MFr Bld: 6.9 % — ABNORMAL HIGH (ref 4.6–6.5)

## 2021-10-31 LAB — TSH: TSH: 1.51 u[IU]/mL (ref 0.35–5.50)

## 2021-10-31 NOTE — Progress Notes (Signed)
   Subjective:    Patient ID: Travis [name], male    DOB: 23-Aug-1955, 66 y.o.   MRN: [id number]  HPI DM- chronic problem, on Metformin '500mg'$  BID.  Due for eye exam- scheduled.  UTD on foot exam w/ podiatry.  UTD on microalbumin.  Rare symptomatic lows.  No numbness or tingling of hands/feet above baseline.  HTN- chronic problem, on Amlodipine '10mg'$  daily, Lasix '20mg'$  daily, Lisinopril '40mg'$  daily, Metoprolol XL '100mg'$  daily w/ adequate control.  No CP, SOB above baseline, HAs, visual changes, edema.  Hyperlipidemia- chronic problem, on Crestor '20mg'$  daily and Fenofibrate '160mg'$  daily.  No abd pain, N/V.  Obesity- pt is down 10 lbs   Review of Systems For ROS see HPI     Objective:   Physical Exam Vitals reviewed.  Constitutional:      General: He is not in acute distress.    Appearance: He is well-developed. He is obese. He is not ill-appearing.  HENT:     Head: Normocephalic and atraumatic.  Eyes:     Extraocular Movements: Extraocular movements intact.     Conjunctiva/sclera: Conjunctivae normal.     Pupils: Pupils are equal, round, and reactive to light.  Neck:     Thyroid: No thyromegaly.  Cardiovascular:     Rate and Rhythm: Normal rate and regular rhythm.     Pulses: Normal pulses.     Heart sounds: Normal heart sounds. No murmur heard. Pulmonary:     Effort: Pulmonary effort is normal. No respiratory distress.     Breath sounds: Normal breath sounds.  Abdominal:     General: Bowel sounds are normal. There is no distension.     Palpations: Abdomen is soft.  Musculoskeletal:     Cervical back: Normal range of motion and neck supple.     Right lower leg: No edema.     Left lower leg: No edema.  Lymphadenopathy:     Cervical: No cervical adenopathy.  Skin:    General: Skin is warm and dry.  Neurological:     General: No focal deficit present.     Mental Status: He is alert and oriented to person, place, and time.     Cranial Nerves: No cranial nerve deficit.   Psychiatric:        Mood and Affect: Mood normal.        Behavior: Behavior normal.           Assessment & Plan:

## 2021-10-31 NOTE — Patient Instructions (Signed)
Follow up in 3-4 months to recheck sugars We'll notify you of your lab results and make any changes if needed Continue to work on healthy diet and regular physical activity- you can do it! Call with any questions or concerns Stay Safe!  Stay Healthy! Happy Fall!!!

## 2021-10-31 NOTE — Assessment & Plan Note (Signed)
Chronic problem.  On Metformin '500mg'$  BID.  Eye exam is scheduled.  UTD on foot exam (podiatry).  UTD on microalbumin.  Currently asymptomatic.  Check labs.  Adjust meds prn

## 2021-10-31 NOTE — Assessment & Plan Note (Signed)
Chronic problem.  On Crestor 20mg daily and Fenofibrate 160mg daily w/o difficulty.  Check labs.  Adjust meds prn  

## 2021-10-31 NOTE — Assessment & Plan Note (Signed)
Pt is down 10 lbs.  He states he is trying to get more steps daily.  Will continue to follow.

## 2021-10-31 NOTE — Assessment & Plan Note (Signed)
Chronic problem.  Adequate control on Amlodipine '10mg'$  daily, Lasix '20mg'$  daily, Lisinopril '40mg'$  daily, and Metoprolol XL '100mg'$  daily.  Currently asymptomatic.  Check labs due to ACE and diuretic use.  No anticipated med changes.

## 2021-11-01 MED ORDER — AMOXICILLIN 875 MG PO TABS: 875.0000 mg | ORAL_TABLET | Freq: Two times a day (BID) | ORAL | 0 refills | Status: AC

## 2021-11-01 NOTE — Addendum Note (Signed)
Addended by: [name] on: 11/01/2021 07:30 AM   Modules accepted: Orders

## 2021-11-03 ENCOUNTER — Other Ambulatory Visit: Payer: Self-pay | Admitting: Family Medicine

## 2021-12-01 NOTE — Progress Notes (Unsigned)
Travis [name] Phone: [phone] Subjective:   [name], am serving as a scribe for Dr. Hulan Saas.  I'm seeing this patient by the request  of:  [name], MD  CC: Bilateral knee pain  [id number]  10/04/2021 Chronic problem with exacerbation.  Injections given again.  Secondary to patient's weight he is not a surgical candidate at this moment. Discussed with patient   Continuing to work on the loss of weight.  Patient did have some small effusion noted of the left knee and did do very mild aspiration done.  We will send to lab secondary to the consistency of the aspiration.  Patient will follow-up with me again otherwise in 8 to 10 weeks.      Update Travis [name] is a 66 y.o. male coming in with complaint of B knee pain. Patient states that his knee pain has improved with gout medication. Would like to try gel injection again if it would work better now that his gout is under control.      Past Medical History:  Diagnosis Date   Appendicitis    Blood in stool    Coronary artery disease 1997   s/p PCI by Dr. Glade Lloyd   Diverticulosis of colon with hemorrhage 2009   Dyslipidemia, goal LDL below 70    Edema    Encounter for long-term (current) use of other medications    Essential hypertension, benign    Family history of thyroid disease    Frequent unifocal PVCs    GBS (Guillain Barre syndrome) (HCC)    Morbid obesity (HCC)    Myalgia and myositis, unspecified    Prostate cancer ([place]) 2012   Pulmonary HTN ([place]) 08/14/2018   PASP 53mHg by echo 2017   Sleep apnea with use of continuous positive airway pressure (CPAP)    [place] 05/25/2014   Type II or unspecified type diabetes mellitus without mention of complication, not stated as uncontrolled    Past Surgical History:  Procedure Laterality Date   ANGIOPLASTY  1997   Dr. TGlade Lloyd  APPENDECTOMY     CATARACT  EXTRACTION Left 05/18/2020   CATARACT EXTRACTION Right 06/08/2020   radioactive seed prostate     Social History   Socioeconomic History   Marital status: Married    Spouse name: Not on file   Number of children: 0   Years of education: 12   Highest education level: High school graduate  Occupational History   Occupation: lScientist, research (medical) [employer]    Comment: mod/heavy lifting  Tobacco Use   Smoking status: Never   Smokeless tobacco: Never  Vaping Use   Vaping Use: Never used  Substance and Sexual Activity   Alcohol use: No   Drug use: No   Sexual activity: Not on file  Other Topics Concern   Not on file  Social History Narrative   Lives at home with his wife.   Right-handed.   Caffeine: occasional use.   Social Determinants of Health   Financial Resource Strain: Not on file  Food Insecurity: Not on file  Transportation Needs: Not on file  Physical Activity: Not on file  Stress: Not on file  Social Connections: Not on file   Allergies  Allergen Reactions   Crestor [Rosuvastatin]     Muscle Pain    Lipitor [Atorvastatin] Other (See Comments)    Arm weakness    Family  History  Problem Relation Age of Onset   CVA Father    Hypertension Father    Diabetes Mother    Hypothyroidism Sister    Hypertension Sister     Current Outpatient Medications (Endocrine & Metabolic):    metFORMIN (GLUCOPHAGE) 500 MG tablet, TAKE 2 TABLETS BY MOUTH 2 TIMES DAILY WITH A MEAL.  Current Outpatient Medications (Cardiovascular):    amLODipine (NORVASC) 10 MG tablet, TAKE 1 TABLET BY MOUTH EVERY DAY   fenofibrate 160 MG tablet, TAKE 1 TABLET BY MOUTH EVERY DAY   furosemide (LASIX) 20 MG tablet, TAKE 1 TO 2 TABLETS BY MOUTH TWICE A DAY AS NEEDED FOR EDEMA   lisinopril (ZESTRIL) 40 MG tablet, TAKE 1 TABLET BY MOUTH EVERY DAY   metoprolol succinate (TOPROL-XL) 100 MG 24 hr tablet, Take 1 tablet (100 mg total) by mouth daily.   rosuvastatin (CRESTOR) 20 MG  tablet, TAKE 1 TABLET BY MOUTH EVERY DAY   sildenafil (VIAGRA) 100 MG tablet, TAKE 1/2 TO 1 TABLET DAILY AS NEEDED  Current Outpatient Medications (Respiratory):    albuterol (VENTOLIN HFA) 108 (90 Base) MCG/ACT inhaler, TAKE 2 PUFFS BY MOUTH EVERY 6 HOURS AS NEEDED FOR WHEEZE OR SHORTNESS OF BREATH  Current Outpatient Medications (Analgesics):    acetaminophen (TYLENOL) 500 MG tablet, Take 1,000 mg by mouth every 6 (six) hours as needed for moderate pain. Taking 650 mg arthritis BID   allopurinol (ZYLOPRIM) 100 MG tablet, TAKE 1 TABLET BY MOUTH EVERY DAY   aspirin EC 81 MG tablet, Take 81 mg by mouth daily.   celecoxib (CELEBREX) 200 MG capsule, TAKE 1 CAPSULE BY MOUTH TWICE A DAY  Current Outpatient Medications (Hematological):    Cyanocobalamin (VITAMIN B-12 PO), Take 1,000 mcg by mouth.   Current Outpatient Medications (Other):    APPLE CIDER VINEGAR PO, Take 450 mg by mouth.   Ascorbic Acid (VITAMIN C) 500 MG CAPS, Take by mouth.   Blood Glucose Monitoring Suppl (FREESTYLE LITE) DEVI, Use to test sugars twice daily.Dx E11.9   buPROPion (WELLBUTRIN XL) 150 MG 24 hr tablet, TAKE 1 TABLET BY MOUTH EVERY DAY   Cholecalciferol (VITAMIN D3 PO), Take 2,000 Units by mouth daily.    Coconut Oil 1000 MG CAPS, Take by mouth.   FREESTYLE LITE test strip, USE AS INSTRUCTED TO TEST SUGARS TWICE DAILY. DX. E11.9   gabapentin (NEURONTIN) 300 MG capsule, TAKE 1 CAPSULE BY MOUTH THREE TIMES A DAY   Garlic 10 MG CAPS, Take by mouth.   glucosamine-chondroitin 500-400 MG tablet, Take 1 tablet by mouth 3 (three) times daily.   Iron-Vitamins (GERITOL PO), Take by mouth.   Lancets (FREESTYLE) lancets, Use as instructed to test sugars twice daily. Dx. E11.9   magnesium gluconate (MAGONATE) 500 MG tablet, Take 500 mg by mouth daily.    milk thistle 175 MG tablet, Take 175 mg by mouth daily.   Misc Natural Products (TART CHERRY ADVANCED PO), Take 1,200 mg by mouth daily.    Omega-3 Fatty Acids (SALMON  OIL-1000 PO), Take by mouth.   Potassium 99 MG TABS, Take 99 mg by mouth.   traZODone (DESYREL) 100 MG tablet, TAKE 1 TABLET BY MOUTH EVERYDAY AT BEDTIME   Turmeric Curcumin 500 MG CAPS, Take by mouth.   zinc gluconate 50 MG tablet, Take 50 mg by mouth daily.   Reviewed prior external information including notes and imaging from  primary care provider As well as notes that were available from care everywhere and other healthcare systems.  Past medical history, social, surgical and family history all reviewed in electronic medical record.  No pertanent information unless stated regarding to the chief complaint.   Review of Systems:  No headache, visual changes, nausea, vomiting, diarrhea, constipation, dizziness, abdominal pain, skin rash, fevers, chills, night sweats, weight loss, swollen lymph nodes, body aches, joint swelling, chest pain, shortness of breath, mood changes. POSITIVE muscle aches  Objective  Blood pressure 118/84, pulse 63, height '5\' 8"'$  (1.727 m), weight (!) 320 lb (145.2 kg), SpO2 98 %.   General: No apparent distress alert and oriented x3 mood and affect normal, dressed appropriately.  HEENT: Pupils equal, extraocular movements intact  Respiratory: Patient's speak in full sentences and does not appear short of breath  Cardiovascular: No lower extremity edema, non tender, no erythema  Patient does have an antalgic gait noted.  Patient is walking with the aid of a walker.  Significantly less swelling than previous exam but still has some swelling on the left knee.  Instability with valgus and varus force.  After informed written and verbal consent, patient was seated on exam table. Right knee was prepped with alcohol swab and utilizing anterolateral approach, patient's right knee space was injected with 4:1  marcaine 0.5%: Kenalog '40mg'$ /dL. Patient tolerated the procedure well without immediate complications.  After informed written and verbal consent, patient was seated on  exam table. Left knee was prepped with alcohol swab and utilizing anterolateral approach, patient's left knee space was injected with 4:1  marcaine 0.5%: Kenalog '40mg'$ /dL. Patient tolerated the procedure well without immediate complications.    Impression and Recommendations:     The above documentation has been reviewed and is accurate and complete Lyndal Pulley, DO

## 2021-12-06 ENCOUNTER — Ambulatory Visit (INDEPENDENT_AMBULATORY_CARE_PROVIDER_SITE_OTHER): Payer: Medicare Other | Admitting: Family Medicine

## 2021-12-06 VITALS — BP 118/84 | HR 63 | Ht 68.0 in | Wt 320.0 lb

## 2021-12-06 DIAGNOSIS — I2510 Atherosclerotic heart disease of native coronary artery without angina pectoris: Secondary | ICD-10-CM

## 2021-12-06 DIAGNOSIS — M170 Bilateral primary osteoarthritis of knee: Secondary | ICD-10-CM | POA: Diagnosis not present

## 2021-12-06 NOTE — Assessment & Plan Note (Signed)
Repeat injection, bilaterally.  Chronic, with worsening symptoms.  Due to comorbidities difficult for any type of surgical intervention especially significant obesity.  With patient no responding to the allopurinol we will increase to 200 mg daily and will consider the possibility of viscosupplementation again.  Follow-up with me again in 2 to 3 months

## 2021-12-06 NOTE — Patient Instructions (Signed)
See me again in January in about 8-10 weeks Injected both knees today

## 2021-12-07 LAB — SYNOVIAL FLUID ANALYSIS, COMPLETE
Basophils, %: 0 %
Eosinophils-Synovial: 0 % (ref 0–2)
Lymphocytes-Synovial Fld: 50 % (ref 0–74)
Monocyte/Macrophage: 20 % (ref 0–69)
Neutrophil, Synovial: 30 % — ABNORMAL HIGH (ref 0–24)
Synoviocytes, %: 0 % (ref 0–15)
WBC, Synovial: 894 cells/uL — ABNORMAL HIGH (ref <150)

## 2021-12-31 ENCOUNTER — Other Ambulatory Visit: Payer: Self-pay | Admitting: Family Medicine

## 2021-12-31 ENCOUNTER — Encounter: Payer: Self-pay | Admitting: Family Medicine

## 2022-01-01 ENCOUNTER — Other Ambulatory Visit: Payer: Self-pay | Admitting: Family Medicine

## 2022-01-01 ENCOUNTER — Other Ambulatory Visit: Payer: Self-pay

## 2022-01-01 MED ORDER — ALLOPURINOL 100 MG PO TABS: 200.0000 mg | ORAL_TABLET | Freq: Every day | ORAL | 0 refills | Status: DC

## 2022-01-05 LAB — HM DIABETES EYE EXAM

## 2022-01-11 ENCOUNTER — Encounter: Payer: Self-pay | Admitting: Family Medicine

## 2022-01-17 ENCOUNTER — Telehealth: Payer: Self-pay | Admitting: *Deleted

## 2022-01-17 NOTE — Telephone Encounter (Signed)
Bilateral visco initiated through portal.

## 2022-01-17 NOTE — Telephone Encounter (Signed)
-----   Message from Jacqualin Combes sent at 12/06/2021  7:52 AM EST ----- Regarding: visco Can you please run patient for visco after December 31st, 2023? Bilateral knees please.

## 2022-01-18 NOTE — Telephone Encounter (Signed)
Bilateral Monovisc approved. No pre-cert required.

## 2022-01-28 ENCOUNTER — Other Ambulatory Visit: Payer: Self-pay | Admitting: Family Medicine

## 2022-01-31 ENCOUNTER — Ambulatory Visit: Payer: Medicare Other | Admitting: Podiatry

## 2022-02-01 NOTE — Progress Notes (Deleted)
[name] Phone: [phone] Subjective:    I'm seeing this patient by the request  of:  [name], MD  CC:   [id number]  12/06/2021 Repeat injection, bilaterally. Chronic, with worsening symptoms. Due to comorbidities difficult for any type of surgical intervention especially significant obesity. With patient no responding to the allopurinol we will increase to 200 mg daily and will consider the possibility of viscosupplementation again. Follow-up with me again in 2 to 3 months   Update 02/07/2022 Travis [name] is a 67 y.o. male coming in with complaint of B knee pain. Monovisc approved. Patient states        Past Medical History:  Diagnosis Date   Appendicitis    Blood in stool    Coronary artery disease 1997   s/p PCI by Dr. Glade Lloyd   Diverticulosis of colon with hemorrhage 2009   Dyslipidemia, goal LDL below 70    Edema    Encounter for long-term (current) use of other medications    Essential hypertension, benign    Family history of thyroid disease    Frequent unifocal PVCs    GBS (Guillain Barre syndrome) (HCC)    Morbid obesity (HCC)    Myalgia and myositis, unspecified    Prostate cancer ([place]) 2012   Pulmonary HTN ([place]) 08/14/2018   PASP 37mHg by echo 2017   Sleep apnea with use of continuous positive airway pressure (CPAP)    [place] 05/25/[place]   Type II or unspecified type diabetes mellitus without mention of complication, not stated as uncontrolled    Past Surgical History:  Procedure Laterality Date   ANGIOPLASTY  1997   Dr. 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   Right-handed.   Caffeine: occasional use.   Social Determinants of Health   Financial Resource Strain: Not on file  Food Insecurity: Not on file  Transportation Needs: Not on file  Physical Activity: Not on file  Stress: Not on file  Social Connections: Not on file   Allergies  Allergen Reactions   Crestor [Rosuvastatin]     Muscle Pain    Lipitor [Atorvastatin] Other (See Comments)    Arm weakness    Family History  Problem Relation Age of Onset   CVA Father    Hypertension Father    Diabetes Mother    Hypothyroidism Sister    Hypertension Sister     Current Outpatient Medications (Endocrine & Metabolic):    metFORMIN (GLUCOPHAGE) 500 MG tablet, TAKE 2 TABLETS BY MOUTH 2 TIMES DAILY WITH A MEAL.  Current Outpatient Medications (Cardiovascular):    amLODipine (  NORVASC) 10 MG tablet, TAKE 1 TABLET BY MOUTH EVERY DAY   fenofibrate 160 MG tablet, TAKE 1 TABLET BY MOUTH EVERY DAY   furosemide (LASIX) 20 MG tablet, TAKE 1 TO 2 TABLETS BY MOUTH TWICE A DAY AS NEEDED FOR EDEMA   lisinopril (ZESTRIL) 40 MG tablet, TAKE 1 TABLET BY MOUTH EVERY DAY   metoprolol succinate (TOPROL-XL) 100 MG 24 hr tablet, Take 1 tablet (100 mg total) by mouth daily.   rosuvastatin (CRESTOR) 20 MG tablet, TAKE 1 TABLET BY MOUTH EVERY DAY   sildenafil (VIAGRA) 100 MG tablet, TAKE 1/2 TO 1 TABLET DAILY AS NEEDED  Current Outpatient Medications (Respiratory):    albuterol (VENTOLIN HFA) 108 (90 Base) MCG/ACT inhaler, TAKE 2 PUFFS BY MOUTH EVERY 6 HOURS AS NEEDED FOR WHEEZE OR SHORTNESS OF  BREATH  Current Outpatient Medications (Analgesics):    acetaminophen (TYLENOL) 500 MG tablet, Take 1,000 mg by mouth every 6 (six) hours as needed for moderate pain. Taking 650 mg arthritis BID   allopurinol (ZYLOPRIM) 100 MG tablet, Take 2 tablets (200 mg total) by mouth daily.   aspirin EC 81 MG tablet, Take 81 mg by mouth daily.   celecoxib (CELEBREX) 200 MG capsule, TAKE 1 CAPSULE BY MOUTH TWICE A DAY  Current Outpatient Medications (Hematological):    Cyanocobalamin (VITAMIN B-12 PO), Take 1,000 mcg by mouth.   Current Outpatient Medications (Other):    APPLE CIDER VINEGAR PO, Take 450 mg by mouth.   Ascorbic Acid (VITAMIN C) 500 MG CAPS, Take by mouth.   Blood Glucose Monitoring Suppl (FREESTYLE LITE) DEVI, Use to test sugars twice daily.Dx E11.9   buPROPion (WELLBUTRIN XL) 150 MG 24 hr tablet, TAKE 1 TABLET BY MOUTH EVERY DAY   Cholecalciferol (VITAMIN D3 PO), Take 2,000 Units by mouth daily.    Coconut Oil 1000 MG CAPS, Take by mouth.   FREESTYLE LITE test strip, USE AS INSTRUCTED TO TEST SUGARS TWICE DAILY. DX. E11.9   gabapentin (NEURONTIN) 300 MG capsule, TAKE 1 CAPSULE BY MOUTH THREE TIMES A DAY   Garlic 10 MG CAPS, Take by mouth.   glucosamine-chondroitin 500-400 MG tablet, Take 1 tablet by mouth 3 (three) times daily.   Iron-Vitamins (GERITOL PO), Take by mouth.   Lancets (FREESTYLE) lancets, Use as instructed to test sugars twice daily. Dx. E11.9   magnesium gluconate (MAGONATE) 500 MG tablet, Take 500 mg by mouth daily.    milk thistle 175 MG tablet, Take 175 mg by mouth daily.   Misc Natural Products (TART CHERRY ADVANCED PO), Take 1,200 mg by mouth daily.    Omega-3 Fatty Acids (SALMON OIL-1000 PO), Take by mouth.   Potassium 99 MG TABS, Take 99 mg by mouth.   traZODone (DESYREL) 100 MG tablet, TAKE 1 TABLET BY MOUTH EVERYDAY AT BEDTIME   Turmeric Curcumin 500 MG CAPS, Take by mouth.   zinc gluconate 50 MG tablet, Take 50 mg by mouth daily.   Reviewed prior  external information including notes and imaging from  primary care provider As well as notes that were available from care everywhere and other healthcare systems.  Past medical history, social, surgical and family history all reviewed in electronic medical record.  No pertanent information unless stated regarding to the chief complaint.   Review of Systems:  No headache, visual changes, nausea, vomiting, diarrhea, constipation, dizziness, abdominal pain, skin rash, fevers, chills, night sweats, weight loss, swollen lymph nodes, body aches, joint swelling, chest pain, shortness of breath, mood changes. POSITIVE muscle aches  Objective  There were no vitals taken for this visit.   General: No apparent distress alert and oriented x3 mood and affect normal, dressed appropriately.  HEENT: Pupils equal, extraocular movements intact  Respiratory: Patient's speak in full sentences and does not appear short of breath  Cardiovascular: No lower extremity edema, non tender, no erythema      Impression and Recommendations:

## 2022-02-07 ENCOUNTER — Ambulatory Visit: Payer: Medicare Other | Admitting: Family Medicine

## 2022-02-07 NOTE — Progress Notes (Signed)
Travis Travis [name] Phone: [phone] Subjective:   Travis Travis [name], am serving as a scribe for Dr. Hulan Saas.  I'm seeing this patient by the request  of:  [name], MD  CC: bilateral knee pain   [id number]  12/06/2021 Repeat injection, bilaterally.  Chronic, with worsening symptoms.  Due to comorbidities difficult for any type of surgical intervention especially significant obesity.  With patient Travis [name] responding to the allopurinol we will increase to 200 mg daily and will consider the possibility of viscosupplementation again.  Follow-up with me again in 2 to 3 months      Update 02/09/2022 Travis Travis [name] is a 67 y.o. male coming in with complaint of B knee pain. Patient states that he was doing ok but had to get into a tow truck.  Patient states that that seems to exacerbate some of the knee pain.  Starting to have more discomfort with gait.      Past Medical History:  Diagnosis Date   Appendicitis    Blood in stool    Coronary artery disease 1997   s/p PCI by Dr. Glade Lloyd   Diverticulosis of colon with hemorrhage 2009   Dyslipidemia, goal LDL below 70    Edema    Encounter for long-term (current) use of other medications    Essential hypertension, benign    Family history of thyroid disease    Frequent unifocal PVCs    GBS (Guillain Barre syndrome) (HCC)    Morbid obesity (HCC)    Myalgia and myositis, unspecified    Prostate cancer ([place]) 2012   Pulmonary HTN ([facility]) 08/14/2018   PASP 76mHg by echo 2017   Sleep apnea with use of continuous positive airway pressure (CPAP)    [place]   Type II or unspecified type diabetes mellitus without mention of complication, not stated as uncontrolled    Past Surgical History:  Procedure Laterality Date   ANGIOPLASTY  1997   Dr. TGlade Lloyd  APPENDECTOMY     CATARACT EXTRACTION Left 05/18/2020   CATARACT EXTRACTION Right  06/08/2020   radioactive seed prostate     Social History   Socioeconomic History   Marital status: Married    Spouse name: Not on file   Number of children: 0   Years of education: 12   Highest education level: High school graduate  Occupational History   Occupation: lScientist, research (medical) [employer]    Comment: mod/heavy lifting  Tobacco Use   Smoking status: Never   Smokeless tobacco: Never  Vaping Use   Vaping Use: Never used  Substance and Sexual Activity   Alcohol use: Travis [name]   Drug use: Travis [name]   Sexual activity: Not on file  Other Topics Concern   Not on file  Social History Narrative   Lives at home with his wife.   Right-handed.   Caffeine: occasional use.   Social Determinants of Health   Financial Resource Strain: Not on file  Food Insecurity: Not on file  Transportation Needs: Not on file  Physical Activity: Not on file  Stress: Not on file  Social Connections: Not on file   Allergies  Allergen Reactions   Crestor [Rosuvastatin]     Muscle Pain    Lipitor [Atorvastatin] Other (See Comments)    Arm weakness    Family History  Problem Relation Age of Onset   CVA Father    Hypertension Father  Diabetes Mother    Hypothyroidism Sister    Hypertension Sister     Current Outpatient Medications (Endocrine & Metabolic):    metFORMIN (GLUCOPHAGE) 500 MG tablet, TAKE 2 TABLETS BY MOUTH 2 TIMES DAILY WITH A MEAL.  Current Outpatient Medications (Cardiovascular):    amLODipine (NORVASC) 10 MG tablet, TAKE 1 TABLET BY MOUTH EVERY DAY   fenofibrate 160 MG tablet, TAKE 1 TABLET BY MOUTH EVERY DAY   furosemide (LASIX) 20 MG tablet, TAKE 1 TO 2 TABLETS BY MOUTH TWICE A DAY AS NEEDED FOR EDEMA   lisinopril (ZESTRIL) 40 MG tablet, TAKE 1 TABLET BY MOUTH EVERY DAY   metoprolol succinate (TOPROL-XL) 100 MG 24 hr tablet, Take 1 tablet (100 mg total) by mouth daily.   rosuvastatin (CRESTOR) 20 MG tablet, TAKE 1 TABLET BY MOUTH EVERY DAY   sildenafil  (VIAGRA) 100 MG tablet, TAKE 1/2 TO 1 TABLET DAILY AS NEEDED  Current Outpatient Medications (Respiratory):    albuterol (VENTOLIN HFA) 108 (90 Base) MCG/ACT inhaler, TAKE 2 PUFFS BY MOUTH EVERY 6 HOURS AS NEEDED FOR WHEEZE OR SHORTNESS OF BREATH  Current Outpatient Medications (Analgesics):    acetaminophen (TYLENOL) 500 MG tablet, Take 1,000 mg by mouth every 6 (six) hours as needed for moderate pain. Taking 650 mg arthritis BID   allopurinol (ZYLOPRIM) 100 MG tablet, Take 2 tablets (200 mg total) by mouth daily.   aspirin EC 81 MG tablet, Take 81 mg by mouth daily.   celecoxib (CELEBREX) 200 MG capsule, TAKE 1 CAPSULE BY MOUTH TWICE A DAY  Current Outpatient Medications (Hematological):    Cyanocobalamin (VITAMIN B-12 PO), Take 1,000 mcg by mouth.   Current Outpatient Medications (Other):    APPLE CIDER VINEGAR PO, Take 450 mg by mouth.   Ascorbic Acid (VITAMIN C) 500 MG CAPS, Take by mouth.   Blood Glucose Monitoring Suppl (FREESTYLE LITE) DEVI, Use to test sugars twice daily.Dx E11.9   buPROPion (WELLBUTRIN XL) 150 MG 24 hr tablet, TAKE 1 TABLET BY MOUTH EVERY DAY   Cholecalciferol (VITAMIN D3 PO), Take 2,000 Units by mouth daily.    Coconut Oil 1000 MG CAPS, Take by mouth.   FREESTYLE LITE test strip, USE AS INSTRUCTED TO TEST SUGARS TWICE DAILY. DX. E11.9   gabapentin (NEURONTIN) 300 MG capsule, TAKE 1 CAPSULE BY MOUTH THREE TIMES A DAY   Garlic 10 MG CAPS, Take by mouth.   glucosamine-chondroitin 500-400 MG tablet, Take 1 tablet by mouth 3 (three) times daily.   Iron-Vitamins (GERITOL PO), Take by mouth.   Lancets (FREESTYLE) lancets, Use as instructed to test sugars twice daily. Dx. E11.9   magnesium gluconate (MAGONATE) 500 MG tablet, Take 500 mg by mouth daily.    milk thistle 175 MG tablet, Take 175 mg by mouth daily.   Misc Natural Products (TART CHERRY ADVANCED PO), Take 1,200 mg by mouth daily.    Omega-3 Fatty Acids (SALMON OIL-1000 PO), Take by mouth.   Potassium 99 MG  TABS, Take 99 mg by mouth.   traZODone (DESYREL) 100 MG tablet, TAKE 1 TABLET BY MOUTH EVERYDAY AT BEDTIME   Turmeric Curcumin 500 MG CAPS, Take by mouth.   zinc gluconate 50 MG tablet, Take 50 mg by mouth daily.   Reviewed prior external information including notes and imaging from  primary care provider As well as notes that were available from care everywhere and other healthcare systems.  Past medical history, social, surgical and family history all reviewed in electronic medical record.  Travis [name] pertanent  information unless stated regarding to the chief complaint.   Review of Systems:  Travis [name] headache, visual changes, nausea, vomiting, diarrhea, constipation, dizziness, abdominal pain, skin rash, fevers, chills, night sweats, weight loss, swollen lymph nodes, body aches, joint swelling, chest pain, shortness of breath, mood changes. POSITIVE muscle aches  Objective  Blood pressure 122/82, pulse 70, height '5\' 8"'$  (1.727 m), SpO2 98 %.   General: Travis [name] apparent distress alert and oriented x3 mood and affect normal, dressed appropriately.  HEENT: Pupils equal, extraocular movements intact  Respiratory: Patient's speak in full sentences and does not appear short of breath  Cardiovascular: Travis [name] lower extremity edema, non tender, Travis [name] erythema  Bilateral knees do have arthritic changes noted.  Limited range of motion lacking last 2 degrees of extension and only 95 degrees of flexion.  Crepitus noted with range of motion left greater than right.  Instability with valgus and varus force  After informed written and verbal consent, patient was seated on exam table. Right knee was prepped with alcohol swab and utilizing anterolateral approach, patient's right knee space was injected with 48 mg per 3 mL of Monovisc (sodium hyaluronate) in a prefilled syringe was injected easily into the knee through a 22-gauge needle..Patient tolerated the procedure well without immediate complications.  After informed written and  verbal consent, patient was seated on exam table. Left knee was prepped with alcohol swab and utilizing anterolateral approach, patient's left knee space was injected with 40 mg per 3 mL of Monovisc (sodium hyaluronate) in a prefilled syringe was injected easily into the knee through a 22-gauge needle..Patient tolerated the procedure well without immediate complications.    Impression and Recommendations:     The above documentation has been reviewed and is accurate and complete Lyndal Pulley, DO

## 2022-02-09 ENCOUNTER — Ambulatory Visit (INDEPENDENT_AMBULATORY_CARE_PROVIDER_SITE_OTHER): Payer: Medicare Other | Admitting: Family Medicine

## 2022-02-09 VITALS — BP 122/82 | HR 70 | Ht 68.0 in | Wt 320.0 lb

## 2022-02-09 DIAGNOSIS — M170 Bilateral primary osteoarthritis of knee: Secondary | ICD-10-CM | POA: Diagnosis not present

## 2022-02-09 MED ORDER — HYALURONAN 88 MG/4ML IX SOSY
176.0000 mg | PREFILLED_SYRINGE | Freq: Once | INTRA_ARTICULAR | Status: AC
  Administered 2022-02-09: 176 mg via INTRA_ARTICULAR

## 2022-02-09 NOTE — Patient Instructions (Addendum)
Injected knees today See me in 8-10 weeks

## 2022-02-09 NOTE — Assessment & Plan Note (Signed)
Viscosupplementation noted.  Discussed icing regimen and home exercises, discussed which activities to do and which ones to avoid.  Chronic problem with exacerbation.  Increase activity slowly.  Discussed the importance of weight loss still.  Follow-up again with 2 months

## 2022-02-19 ENCOUNTER — Encounter: Payer: Self-pay | Admitting: Family Medicine

## 2022-02-19 ENCOUNTER — Ambulatory Visit (INDEPENDENT_AMBULATORY_CARE_PROVIDER_SITE_OTHER): Payer: Medicare Other | Admitting: Family Medicine

## 2022-02-19 VITALS — BP 124/86 | HR 70 | Temp 98.6°F | Resp 17 | Ht 68.0 in | Wt 317.2 lb

## 2022-02-19 DIAGNOSIS — E119 Type 2 diabetes mellitus without complications: Secondary | ICD-10-CM | POA: Diagnosis not present

## 2022-02-19 LAB — CBC WITH DIFFERENTIAL/PLATELET
Basophils Absolute: 0 10*3/uL (ref 0.0–0.1)
Basophils Relative: 0.2 % (ref 0.0–3.0)
Eosinophils Absolute: 0 10*3/uL (ref 0.0–0.7)
Eosinophils Relative: 0.1 % (ref 0.0–5.0)
HCT: 42.6 % (ref 39.0–52.0)
Hemoglobin: 14.6 g/dL (ref 13.0–17.0)
Lymphocytes Relative: 15.3 % (ref 12.0–46.0)
Lymphs Abs: 1.1 10*3/uL (ref 0.7–4.0)
MCHC: 34.3 g/dL (ref 30.0–36.0)
MCV: 92.1 fl (ref 78.0–100.0)
Monocytes Absolute: 0.5 10*3/uL (ref 0.1–1.0)
Monocytes Relative: 7.5 % (ref 3.0–12.0)
Neutro Abs: 5.5 10*3/uL (ref 1.4–7.7)
Neutrophils Relative %: 76.9 % (ref 43.0–77.0)
Platelets: 173 10*3/uL (ref 150.0–400.0)
RBC: 4.62 Mil/uL (ref 4.22–5.81)
RDW: 13.7 % (ref 11.5–15.5)
WBC: 7.2 10*3/uL (ref 4.0–10.5)

## 2022-02-19 LAB — HEMOGLOBIN A1C: Hgb A1c MFr Bld: 6.6 % — ABNORMAL HIGH (ref 4.6–6.5)

## 2022-02-19 LAB — LIPID PANEL
Cholesterol: 124 mg/dL (ref 0–200)
HDL: 31.8 mg/dL — ABNORMAL LOW (ref >39.00)
NonHDL: 91.96
Total CHOL/HDL Ratio: 4
Triglycerides: 287 mg/dL — ABNORMAL HIGH (ref 0.0–149.0)
VLDL: 57.4 mg/dL — ABNORMAL HIGH (ref 0.0–40.0)

## 2022-02-19 LAB — BASIC METABOLIC PANEL
BUN: 17 mg/dL (ref 6–23)
CO2: 26 mEq/L (ref 19–32)
Calcium: 9.6 mg/dL (ref 8.4–10.5)
Chloride: 102 mEq/L (ref 96–112)
Creatinine, Ser: 0.78 mg/dL (ref 0.40–1.50)
GFR: 93.45 mL/min (ref >60.00)
Glucose, Bld: 155 mg/dL — ABNORMAL HIGH (ref 70–99)
Potassium: 4.2 mEq/L (ref 3.5–5.1)
Sodium: 137 mEq/L (ref 135–145)

## 2022-02-19 LAB — HEPATIC FUNCTION PANEL
ALT: 32 U/L (ref 0–53)
AST: 39 U/L — ABNORMAL HIGH (ref 0–37)
Albumin: 4.2 g/dL (ref 3.5–5.2)
Alkaline Phosphatase: 51 U/L (ref 39–117)
Bilirubin, Direct: 0.1 mg/dL (ref 0.0–0.3)
Total Bilirubin: 0.5 mg/dL (ref 0.2–1.2)
Total Protein: 6.7 g/dL (ref 6.0–8.3)

## 2022-02-19 LAB — LDL CHOLESTEROL, DIRECT: Direct LDL: 64 mg/dL

## 2022-02-19 LAB — TSH: TSH: 1.45 u[IU]/mL (ref 0.35–5.50)

## 2022-02-19 NOTE — Patient Instructions (Signed)
Follow up in 3-4 months to recheck sugar, blood pressure, and cholesterol We'll notify you of your lab results and make any changes if needed Continue to work on healthy diet and regular physical activity Call with any questions or concerns Stay Safe!  Stay Healthy!

## 2022-02-19 NOTE — Progress Notes (Signed)
   Subjective:    Patient ID: Travis [name], male    DOB: 03-31-1955, 67 y.o.   MRN: [id number]  HPI DM- chronic problem, on Metformin '1000mg'$  BID.  On ACE for renal protection.  UTD on eye exam, foot exam, microalbumin.  No CP, SOB, HA's, visual changes, abd pain, N/V.  Denies symptomatic lows.  No new or different numbness/tingling of hands/feet.  Morbid Obesity- pt has gained 10 lbs since last visit.  Pt reports feeling 'blah'.  Pt reports he attempts to walk 'a couple of times a week'.  Otherwise only gets about 300 steps/day.  Recently got both knees injected which he thinks is helping w/ pain.   Review of Systems For ROS see HPI     Objective:   Physical Exam Vitals reviewed.  Constitutional:      General: He is not in acute distress.    Appearance: He is well-developed. He is obese. He is not ill-appearing.  HENT:     Head: Normocephalic and atraumatic.  Eyes:     Extraocular Movements: Extraocular movements intact.     Conjunctiva/sclera: Conjunctivae normal.     Pupils: Pupils are equal, round, and reactive to light.  Neck:     Thyroid: No thyromegaly.  Cardiovascular:     Rate and Rhythm: Normal rate and regular rhythm.     Pulses: Normal pulses.     Heart sounds: Normal heart sounds. No murmur heard. Pulmonary:     Effort: Pulmonary effort is normal. No respiratory distress.     Breath sounds: Normal breath sounds.  Abdominal:     General: Bowel sounds are normal. There is no distension.     Palpations: Abdomen is soft.  Musculoskeletal:     Cervical back: Normal range of motion and neck supple.     Right lower leg: No edema.     Left lower leg: No edema.  Lymphadenopathy:     Cervical: No cervical adenopathy.  Skin:    General: Skin is warm and dry.  Neurological:     General: No focal deficit present.     Mental Status: He is alert and oriented to person, place, and time.     Cranial Nerves: No cranial nerve deficit.  Psychiatric:        Mood and Affect:  Mood normal.        Behavior: Behavior normal.           Assessment & Plan:

## 2022-02-19 NOTE — Assessment & Plan Note (Signed)
Deteriorated.  Pt has gained 10 lbs.  He states he is trying to walk 'a couple of times a week' but otherwise he will only get ~300 steps/day around his apartment.  Stressed need for more physical activity to maintain mobility.  Discussed possibly increasing Wellbutrin to help w/ his energy level and mood.  He feels that situation will improve w/ longer days and better weather.  Will hold on adjusting meds for now but will follow.

## 2022-02-19 NOTE — Assessment & Plan Note (Signed)
Chronic problem.  Currently on Metformin '1000mg'$  BID.  On ACE for renal protection.  UTD on eye exam, foot exam, microalbumin.  He is currently asymptomatic.  He has gained 10 lbs since last visit.  Stressed need for low carb diet and regular physical activity.  Check labs.  Adjust meds prn

## 2022-02-20 ENCOUNTER — Telehealth: Payer: Self-pay

## 2022-02-20 NOTE — Telephone Encounter (Signed)
-----   Message from [name], MD sent at 02/20/2022  7:13 AM EST ----- Labs are stable and look good!  Try and increase your walking/physical activity as the weather improves b/c this will improve your triglycerides (fatty part of blood)

## 2022-02-20 NOTE — Telephone Encounter (Signed)
Pt seen results Via my chart  

## 2022-03-14 ENCOUNTER — Ambulatory Visit (INDEPENDENT_AMBULATORY_CARE_PROVIDER_SITE_OTHER): Payer: Medicare Other | Admitting: Podiatry

## 2022-03-14 ENCOUNTER — Encounter: Payer: Self-pay | Admitting: Podiatry

## 2022-03-14 DIAGNOSIS — M79675 Pain in left toe(s): Secondary | ICD-10-CM

## 2022-03-14 DIAGNOSIS — B351 Tinea unguium: Secondary | ICD-10-CM | POA: Diagnosis not present

## 2022-03-14 DIAGNOSIS — E119 Type 2 diabetes mellitus without complications: Secondary | ICD-10-CM | POA: Diagnosis not present

## 2022-03-14 DIAGNOSIS — M79674 Pain in right toe(s): Secondary | ICD-10-CM | POA: Diagnosis not present

## 2022-03-14 NOTE — Progress Notes (Signed)
This patient returns to my office for at risk foot care.  This patient requires this care by a professional since this patient will be at risk due to having type 2 diabetes.  This patient is unable to cut nails himself since the patient cannot reach his nails.These nails are painful walking and wearing shoes.  This patient presents for at risk foot care today.  General Appearance  Alert, conversant and in no acute stress.  Vascular  Dorsalis pedis and posterior tibial  pulses are palpable  bilaterally.  Capillary return is within normal limits  bilaterally. Temperature is within normal limits  bilaterally.  Neurologic  Senn-Weinstein monofilament wire test within normal limits  bilaterally. Muscle power within normal limits bilaterally.  Nails Thick disfigured discolored nails with subungual debris  from hallux to fifth toes bilaterally. No evidence of bacterial infection or drainage bilaterally.  Orthopedic  No limitations of motion  feet .  No crepitus or effusions noted.  HAV  B/L.  Hammer toes  B/L.  Skin  normotropic skin with no porokeratosis noted bilaterally.  No signs of infections or ulcers noted.     Onychomycosis  Pain in right toes  Pain in left toes  Consent was obtained for treatment procedures.   Mechanical debridement of nails 1-5  bilaterally performed with a nail nipper.  Filed with dremel without incident.    Return office visit    3 months                  Told patient to return for periodic foot care and evaluation due to potential at risk complications.   Gardiner Barefoot DPM

## 2022-03-23 ENCOUNTER — Other Ambulatory Visit: Payer: Self-pay | Admitting: Family Medicine

## 2022-03-23 ENCOUNTER — Other Ambulatory Visit: Payer: Self-pay

## 2022-03-23 MED ORDER — FREESTYLE LITE TEST VI STRP: ORAL_STRIP | 3 refills | Status: DC

## 2022-03-27 ENCOUNTER — Other Ambulatory Visit: Payer: Medicare Other | Admitting: Pharmacist

## 2022-03-27 NOTE — Progress Notes (Unsigned)
03/27/2022 Name: Travis [name] MRN: [id number] DOB: August 10, 1955  Chief Complaint  Patient presents with   Medication Management    Testing supplies    Travis [name] is a 67 y.o. year old male who presented for a telephone visit.   Patient's wife was referred to pharmacist and during our phone visit Mr. Travis [name] reported he was having difficulty getting his testing supplies filled for Freestyle Lite.   Subjective:  Care Team: Primary Care Provider: [name], MD ; Next Scheduled Visit: 05/21/2022  Medication Access/Adherence  [id number] [place] [id number]: [phone]: [phone]  Patient reports affordability concerns with their medications: No  Patient reports access/transportation concerns to their pharmacy: No  Patient reports adherence concerns with their medications:  Yes  has not been able to get test strip or l[facility]et from [facility].    Objective:  Lab Results  Component Value Date   HGBA1C 6.6 (H) 02/19/2022    Lab Results  Component Value Date   CREATININE 0.78 02/19/2022   BUN 17 02/19/2022   NA 137 02/19/2022   K 4.2 02/19/2022   CL 102 02/19/2022   CO2 26 02/19/2022    Lab Results  Component Value Date   CHOL 124 02/19/2022   HDL 31.80 (L) 02/19/2022   LDLCALC 57 10/31/2021   LDLDIRECT 64.0 02/19/2022   TRIG 287.0 (H) 02/19/2022   CHOLHDL 4 02/19/2022    Medications Reviewed Today     Reviewed by ECherre Robins RPH-CPP (Pharmacist) on 03/27/22 at 1[place]rectionvilleList Status: <None>   Medication Order Taking? Sig Documenting Provider Last Dose Status Informant  acetaminophen (TYLENOL) 500 MG tablet [id number] Take 1,000 mg by mouth every 6 (six) hours as needed for moderate pain. Taking 650 mg arthritis BID [provider] Taking Active   albuterol (VENTOLIN HFA) 108 (90 Base) MCG/ACT inhaler [id number] TAKE 2 PUFFS BY MOUTH EVERY  6 HOURS AS NEEDED FOR WHEEZE OR SHORTNESS OF BREATH TMidge Minium MD Taking Active   allopurinol (ZYLOPRIM) 100 MG tablet [id number] Take 2 tablets (200 mg total) by mouth daily. SLyndal Pulley DO Taking Active   amLODipine (NORVASC) 10 MG tablet [id number] TAKE 1 TABLET BY MOUTH EVERY DAY TMidge Minium MD Taking Active   APPLE CIDER VINEGAR PO [id number] Take 450 mg by mouth. [provider] Taking Active   Ascorbic Acid (VITAMIN C) 500 MG CAPS [id number] Take by mouth. [provider] Taking Active   aspirin EC 81 MG tablet [id number] Take 81 mg by mouth daily. [provider] Taking Active Self  Blood Glucose Monitoring Suppl (FREESTYLE LITE) DEVI [id number] Use to test sugars twice daily.Dx E11.9 TMidge Minium MD Taking Active   buPROPion (WELLBUTRIN XL) 150 MG 24 hr tablet [id number] TAKE 1 TABLET BY MOUTH EVERY DAY TMidge Minium MD Taking Active   celecoxib (CELEBREX) 200 MG capsule [id number] TAKE 1 CAPSULE BY MOUTH TWICE A DAY TMidge Minium MD Taking Active   Cholecalciferol (VITAMIN D3 PO) [id number] Take 2,000 Units by mouth daily.  [provider] Taking Active Self  Coconut Oil 1000 MG CAPS [id number] Take by mouth. [provider] Taking Active   Cyanocobalamin (VITAMIN B-12 PO) [id number] Take 1,000 mcg by mouth.  [provider] Taking Active   fenofibrate 160 MG tablet [id number] TAKE  1 TABLET BY MOUTH EVERY DAY Travis Minium, MD Taking Active   furosemide (LASIX) 20 MG tablet [id number] No TAKE 1 TO 2 TABLETS BY MOUTH TWICE A DAY AS NEEDED FOR EDEMA Travis Minium, MD Taking Active   gabapentin (NEURONTIN) 300 MG capsule [id number] No TAKE 1 CAPSULE BY MOUTH THREE TIMES A DAY Travis Minium, MD Taking Active   Garlic 10 MG CAPS 0000000 No Take by mouth. [provider] Taking Active   glucosamine-chondroitin 500-400 MG tablet [id number] No Take 1  tablet by mouth 3 (three) times daily. [provider] Taking Active   glucose blood (FREESTYLE LITE) test strip [id number]  Use as instructed Travis Minium, MD  Active   lisinopril (ZESTRIL) 40 MG tablet [id number] No TAKE 1 TABLET BY MOUTH EVERY DAY Travis Minium, MD Taking Active   magnesium gluconate (MAGONATE) 500 MG tablet [id number] No Take 500 mg by mouth daily.  [provider] Taking Active Self  metFORMIN (GLUCOPHAGE) 500 MG tablet [id number] No TAKE 2 TABLETS BY MOUTH 2 TIMES DAILY WITH A MEAL. Travis Minium, MD Taking Active   metoprolol succinate (TOPROL-XL) 100 MG 24 hr tablet [id number] No Take 1 tablet (100 mg total) by mouth daily. Travis Rile, MD Taking Active   milk thistle 175 MG tablet [id number] No Take 175 mg by mouth daily. [provider] Taking Active   Misc Natural Products (TART CHERRY ADVANCED PO) [id number] No Take 1,200 mg by mouth daily.  [provider] Taking Active Self  Omega-3 Fatty Acids (SALMON OIL-1000 PO) [id number] No Take by mouth. [provider] Taking Active            Med Note Travis [name], Hermina Staggers   Tue Feb 21, 2021  8:40 AM) Patient thinks he takes 1250 daily  Potassium 99 MG TABS [id number] No Take 99 mg by mouth. [provider] Taking Active   rosuvastatin (CRESTOR) 20 MG tablet [id number] No TAKE 1 TABLET BY MOUTH EVERY DAY Travis Minium, MD Taking Active   sildenafil (VIAGRA) 100 MG tablet [id number] No TAKE 1/2 TO 1 TABLET DAILY AS NEEDED Travis Minium, MD Taking Active   traZODone (DESYREL) 100 MG tablet [id number] No TAKE 1 TABLET BY MOUTH EVERYDAY AT BEDTIME Travis Minium, MD Taking Active   Turmeric Curcumin 500 MG CAPS [id number] No Take by mouth. [provider] Taking Active Self  zinc gluconate 50 MG tablet [id number] No Take 50 mg by mouth daily. [provider] Taking Active Self              Assessment/Plan:   Medication  Management: - [facility] and [facility] was rejecting due to restriction from Medicare - only able to test blood glucose once daily if not currently using insulin.  - updated prescriptions for lancets and Freestyle Lite test strips to check once a day. Forwarded to PCP to review and sign if agrees.  - [facility] that once Rx was changed once daily both strips and lancet would be $0 for patient.   Follow Up Plan: no follow up needed. Patient has Clinical Pharmacist Practitioner's contact information if he has further questions or concerns.  Cherre Robins, [facility]

## 2022-03-28 MED ORDER — FREESTYLE LANCETS MISC: 12 refills | Status: DC

## 2022-03-28 MED ORDER — FREESTYLE LITE TEST VI STRP: ORAL_STRIP | 3 refills | Status: DC

## 2022-03-29 ENCOUNTER — Other Ambulatory Visit: Payer: Self-pay | Admitting: Family Medicine

## 2022-03-30 ENCOUNTER — Other Ambulatory Visit: Payer: Self-pay | Admitting: Family Medicine

## 2022-04-05 NOTE — Progress Notes (Signed)
[place] Phone: [phone] Subjective:   [name], am serving as a scribe for Dr. Hulan Saas.  I'm seeing this patient by the request  of:  [name], MD  CC: Bilateral knee pain  QA:[id number]  02/09/2022 Viscosupplementation noted.  Discussed icing regimen and home exercises, discussed which activities to do and which ones to avoid.  Chronic problem with exacerbation.  Increase activity slowly.  Discussed the importance of weight loss still.  Follow-up again with 2 months      Update 04/18/2022 NAUTICA KREUTZER is a 67 y.o. male coming in with complaint of B knee pain. Patient states that he is doing better since last visit. Painful to hyperextend knee.  Patient feels like he does have some instability.  Still working on the weight loss.    Past Medical History:  Diagnosis Date   Appendicitis    Blood in stool    Coronary artery disease 1997   s/p PCI by Dr. Glade Lloyd   Diverticulosis of colon with hemorrhage 2009   Dyslipidemia, goal LDL below 70    Edema    Encounter for long-term (current) use of other medications    Essential hypertension, benign    Family history of thyroid disease    Frequent unifocal PVCs    GBS (Guillain Barre syndrome) (HCC)    Morbid obesity (HCC)    Myalgia and myositis, unspecified    Prostate cancer ([place]) 2012   Pulmonary HTN ([place]) 08/14/2018   PASP 77mmHg by echo 2017   Sleep apnea with use of continuous positive airway pressure (CPAP)    Thrombocytopenia ([place]) 05/25/2014   Type II or unspecified type diabetes mellitus without mention of complication, not stated as uncontrolled    Past Surgical History:  Procedure Laterality Date   ANGIOPLASTY  1997   Dr. Glade Lloyd   APPENDECTOMY     CATARACT EXTRACTION Left 05/18/2020   CATARACT EXTRACTION Right 06/08/2020   radioactive seed prostate     Social History   Socioeconomic History   Marital status:  Married    Spouse name: Not on file   Number of children: 0   Years of education: 12   Highest education level: High school graduate  Occupational History   Occupation: Scientist, research (medical): [employer]    Comment: mod/heavy lifting  Tobacco Use   Smoking status: Never   Smokeless tobacco: Never  Vaping Use   Vaping Use: Never used  Substance and Sexual Activity   Alcohol use: No   Drug use: No   Sexual activity: Not on file  Other Topics Concern   Not on file  Social History Narrative   Lives at home with his wife.   Right-handed.   Caffeine: occasional use.   Social Determinants of Health   Financial Resource Strain: Not on file  Food Insecurity: Not on file  Transportation Needs: Not on file  Physical Activity: Not on file  Stress: Not on file  Social Connections: Not on file   Allergies  Allergen Reactions   Crestor [Rosuvastatin]     Muscle Pain    Lipitor [Atorvastatin] Other (See Comments)    Arm weakness    Family History  Problem Relation Age of Onset   CVA Father    Hypertension Father    Diabetes Mother    Hypothyroidism Sister    Hypertension Sister     Current Outpatient Medications (Endocrine &  Metabolic):    metFORMIN (GLUCOPHAGE) 500 MG tablet, TAKE 2 TABLETS BY MOUTH 2 TIMES DAILY WITH A MEAL.  Current Outpatient Medications (Cardiovascular):    amLODipine (NORVASC) 10 MG tablet, TAKE 1 TABLET BY MOUTH EVERY DAY   fenofibrate 160 MG tablet, TAKE 1 TABLET BY MOUTH EVERY DAY   furosemide (LASIX) 20 MG tablet, TAKE 1 TO 2 TABLETS BY MOUTH TWICE A DAY AS NEEDED FOR EDEMA   lisinopril (ZESTRIL) 40 MG tablet, TAKE 1 TABLET BY MOUTH EVERY DAY   metoprolol succinate (TOPROL-XL) 100 MG 24 hr tablet, Take 1 tablet (100 mg total) by mouth daily.   rosuvastatin (CRESTOR) 20 MG tablet, TAKE 1 TABLET BY MOUTH EVERY DAY   sildenafil (VIAGRA) 100 MG tablet, TAKE 1/2 TO 1 TABLET DAILY AS NEEDED  Current Outpatient Medications (Respiratory):     albuterol (VENTOLIN HFA) 108 (90 Base) MCG/ACT inhaler, TAKE 2 PUFFS BY MOUTH EVERY 6 HOURS AS NEEDED FOR WHEEZE OR SHORTNESS OF BREATH  Current Outpatient Medications (Analgesics):    acetaminophen (TYLENOL) 500 MG tablet, Take 1,000 mg by mouth every 6 (six) hours as needed for moderate pain. Taking 650 mg arthritis BID   allopurinol (ZYLOPRIM) 100 MG tablet, TAKE 2 TABLETS BY MOUTH EVERY DAY   aspirin EC 81 MG tablet, Take 81 mg by mouth daily.   celecoxib (CELEBREX) 200 MG capsule, TAKE 1 CAPSULE BY MOUTH TWICE A DAY  Current Outpatient Medications (Hematological):    Cyanocobalamin (VITAMIN B-12 PO), Take 1,000 mcg by mouth.   Current Outpatient Medications (Other):    APPLE CIDER VINEGAR PO, Take 450 mg by mouth.   Ascorbic Acid (VITAMIN C) 500 MG CAPS, Take by mouth.   Blood Glucose Monitoring Suppl (FREESTYLE LITE) DEVI, Use to test sugars twice daily.Dx E11.9   buPROPion (WELLBUTRIN XL) 150 MG 24 hr tablet, TAKE 1 TABLET BY MOUTH EVERY DAY   Cholecalciferol (VITAMIN D3 PO), Take 2,000 Units by mouth daily.    Coconut Oil 1000 MG CAPS, Take by mouth.   gabapentin (NEURONTIN) 300 MG capsule, TAKE 1 CAPSULE BY MOUTH THREE TIMES A DAY   Garlic 10 MG CAPS, Take by mouth.   glucosamine-chondroitin 500-400 MG tablet, Take 1 tablet by mouth 3 (three) times daily.   glucose blood (FREESTYLE LITE) test strip, Use to check blood glucose once daily (Dx: E 11.9 - type 2 DM without long term insulin use)   Lancets (FREESTYLE) lancets, Use to check blood glucose once daily (Dx: E 11.9 - type 2 DM without long term insulin use)   magnesium gluconate (MAGONATE) 500 MG tablet, Take 500 mg by mouth daily.    milk thistle 175 MG tablet, Take 175 mg by mouth daily.   Misc Natural Products (TART CHERRY ADVANCED PO), Take 1,200 mg by mouth daily.    Omega-3 Fatty Acids (SALMON OIL-1000 PO), Take by mouth.   Potassium 99 MG TABS, Take 99 mg by mouth.   traZODone (DESYREL) 100 MG tablet, TAKE 1 TABLET  BY MOUTH EVERYDAY AT BEDTIME   Turmeric Curcumin 500 MG CAPS, Take by mouth.   zinc gluconate 50 MG tablet, Take 50 mg by mouth daily.   Reviewed prior external information including notes and imaging from  primary care provider As well as notes that were available from care everywhere and other healthcare systems.  Past medical history, social, surgical and family history all reviewed in electronic medical record.  No pertanent information unless stated regarding to the chief complaint.   Review  of Systems:  No headache, visual changes, nausea, vomiting, diarrhea, constipation, dizziness, abdominal pain, skin rash, fevers, chills, night sweats, weight loss, swollen lymph nodes, body aches, joint swelling, chest pain, shortness of breath, mood changes. POSITIVE muscle aches  Objective  Blood pressure 132/84, pulse 74, height 5\' 8"  (1.727 m), weight (!) 321 lb (145.6 kg), SpO2 98 %.   General: No apparent distress alert and oriented x3 mood and affect normal, dressed appropriately.  HEENT: Pupils equal, extraocular movements intact  Respiratory: Patient's speak in full sentences and does not appear short of breath  Cardiovascular: Mild lower extremity swelling noted.  Patient does have antalgic gait using a rolling walker for stability.  Crepitus of the knee with any type of range of motion.  After informed written and verbal consent, patient was seated on exam table. Right knee was prepped with alcohol swab and utilizing anterolateral approach, patient's right knee space was injected with 4:1  marcaine 0.5%: Kenalog 40mg /dL. Patient tolerated the procedure well without immediate complications.  After informed written and verbal consent, patient was seated on exam table. Left knee was prepped with alcohol swab and utilizing anterolateral approach, patient's left knee space was injected with 4:1  marcaine 0.5%: Kenalog 40mg /dL. Patient tolerated the procedure well without immediate  complications.     Impression and Recommendations:

## 2022-04-11 ENCOUNTER — Other Ambulatory Visit: Payer: Self-pay | Admitting: Family Medicine

## 2022-04-16 ENCOUNTER — Other Ambulatory Visit: Payer: Self-pay | Admitting: Cardiovascular Disease

## 2022-04-18 ENCOUNTER — Encounter: Payer: Self-pay | Admitting: Family Medicine

## 2022-04-18 ENCOUNTER — Ambulatory Visit (INDEPENDENT_AMBULATORY_CARE_PROVIDER_SITE_OTHER): Payer: Medicare Other | Admitting: Family Medicine

## 2022-04-18 ENCOUNTER — Other Ambulatory Visit: Payer: Self-pay | Admitting: Family Medicine

## 2022-04-18 VITALS — BP 132/84 | HR 74 | Ht 68.0 in | Wt 321.0 lb

## 2022-04-18 DIAGNOSIS — M170 Bilateral primary osteoarthritis of knee: Secondary | ICD-10-CM

## 2022-04-18 NOTE — Patient Instructions (Addendum)
Great to see you  Check with pharmacy about allopurinol See me in 8-10 weeks

## 2022-04-18 NOTE — Telephone Encounter (Signed)
Gabapentin 300 mg LOV: 02/19/22 Last ///Refill:09/15/21 Upcoming appt: 05/21/22

## 2022-04-18 NOTE — Assessment & Plan Note (Signed)
Chronic problem with exacerbation patient still social determinants of health include patient's weight at this time.  I do need to get BMI under 40 to even be a surgical candidate for any type of replacement.  Patient also a type II diabetic.  Decrease in physical activity and difficulty even walking long distances.  We discussed with patient about icing regimen and home exercises.  Does seem to respond relatively well to every 8 to 9-week intervals for injections.  Follow-up with me again in that timeframe for further evaluation and management

## 2022-04-18 NOTE — Telephone Encounter (Signed)
Pt aware Rx has been sent in.  

## 2022-05-16 ENCOUNTER — Telehealth: Payer: Self-pay | Admitting: Family Medicine

## 2022-05-16 NOTE — Telephone Encounter (Signed)
Contacted Travis [name] to schedule their annual wellness visit. Appointment made for 05/23/2022.  Thank you,  [facility] Support [facility] Direct dial  [phone]

## 2022-05-21 ENCOUNTER — Other Ambulatory Visit: Payer: Self-pay | Admitting: General Practice

## 2022-05-21 ENCOUNTER — Ambulatory Visit (INDEPENDENT_AMBULATORY_CARE_PROVIDER_SITE_OTHER): Payer: Medicare Other | Admitting: Family Medicine

## 2022-05-21 VITALS — BP 128/66 | HR 72 | Temp 98.0°F | Resp 18 | Ht 68.0 in | Wt 317.2 lb

## 2022-05-21 DIAGNOSIS — Z7984 Long term (current) use of oral hypoglycemic drugs: Secondary | ICD-10-CM | POA: Diagnosis not present

## 2022-05-21 DIAGNOSIS — E119 Type 2 diabetes mellitus without complications: Secondary | ICD-10-CM

## 2022-05-21 DIAGNOSIS — E1169 Type 2 diabetes mellitus with other specified complication: Secondary | ICD-10-CM

## 2022-05-21 DIAGNOSIS — Z125 Encounter for screening for malignant neoplasm of prostate: Secondary | ICD-10-CM

## 2022-05-21 DIAGNOSIS — F32A Depression, unspecified: Secondary | ICD-10-CM

## 2022-05-21 DIAGNOSIS — I10 Essential (primary) hypertension: Secondary | ICD-10-CM | POA: Diagnosis not present

## 2022-05-21 DIAGNOSIS — E785 Hyperlipidemia, unspecified: Secondary | ICD-10-CM

## 2022-05-21 LAB — MICROALBUMIN / CREATININE URINE RATIO
Creatinine,U: 96 mg/dL
Microalb Creat Ratio: 2.6 mg/g (ref 0.0–30.0)
Microalb, Ur: 2.5 mg/dL — ABNORMAL HIGH (ref 0.0–1.9)

## 2022-05-21 LAB — HEPATIC FUNCTION PANEL
ALT: 32 U/L (ref 0–53)
AST: 33 U/L (ref 0–37)
Albumin: 4.3 g/dL (ref 3.5–5.2)
Alkaline Phosphatase: 53 U/L (ref 39–117)
Bilirubin, Direct: 0.2 mg/dL (ref 0.0–0.3)
Total Bilirubin: 0.5 mg/dL (ref 0.2–1.2)
Total Protein: 6.6 g/dL (ref 6.0–8.3)

## 2022-05-21 LAB — CBC WITH DIFFERENTIAL/PLATELET
Basophils Absolute: 0 10*3/uL (ref 0.0–0.1)
Basophils Relative: 0.3 % (ref 0.0–3.0)
Eosinophils Absolute: 0 10*3/uL (ref 0.0–0.7)
Eosinophils Relative: 0 % (ref 0.0–5.0)
HCT: 42.6 % (ref 39.0–52.0)
Hemoglobin: 14.5 g/dL (ref 13.0–17.0)
Lymphocytes Relative: 14.6 % (ref 12.0–46.0)
Lymphs Abs: 1.2 10*3/uL (ref 0.7–4.0)
MCHC: 33.9 g/dL (ref 30.0–36.0)
MCV: 91.8 fl (ref 78.0–100.0)
Monocytes Absolute: 0.5 10*3/uL (ref 0.1–1.0)
Monocytes Relative: 6.5 % (ref 3.0–12.0)
Neutro Abs: 6.6 10*3/uL (ref 1.4–7.7)
Neutrophils Relative %: 78.6 % — ABNORMAL HIGH (ref 43.0–77.0)
Platelets: 211 10*3/uL (ref 150.0–400.0)
RBC: 4.64 Mil/uL (ref 4.22–5.81)
RDW: 14.1 % (ref 11.5–15.5)
WBC: 8.5 10*3/uL (ref 4.0–10.5)

## 2022-05-21 LAB — BASIC METABOLIC PANEL
BUN: 18 mg/dL (ref 6–23)
CO2: 27 mEq/L (ref 19–32)
Calcium: 9.8 mg/dL (ref 8.4–10.5)
Chloride: 99 mEq/L (ref 96–112)
Creatinine, Ser: 0.83 mg/dL (ref 0.40–1.50)
GFR: 91.56 mL/min (ref >60.00)
Glucose, Bld: 156 mg/dL — ABNORMAL HIGH (ref 70–99)
Potassium: 4.7 mEq/L (ref 3.5–5.1)
Sodium: 137 mEq/L (ref 135–145)

## 2022-05-21 LAB — HEMOGLOBIN A1C: Hgb A1c MFr Bld: 7.4 % — ABNORMAL HIGH (ref 4.6–6.5)

## 2022-05-21 LAB — TSH: TSH: 1.38 u[IU]/mL (ref 0.35–5.50)

## 2022-05-21 LAB — LIPID PANEL
Cholesterol: 125 mg/dL (ref 0–200)
HDL: 34.8 mg/dL — ABNORMAL LOW (ref >39.00)
NonHDL: 89.82
Total CHOL/HDL Ratio: 4
Triglycerides: 238 mg/dL — ABNORMAL HIGH (ref 0.0–149.0)
VLDL: 47.6 mg/dL — ABNORMAL HIGH (ref 0.0–40.0)

## 2022-05-21 LAB — LDL CHOLESTEROL, DIRECT: Direct LDL: 69 mg/dL

## 2022-05-21 LAB — PSA, MEDICARE: PSA: 0 ng/ml — ABNORMAL LOW (ref 0.10–4.00)

## 2022-05-21 MED ORDER — BUPROPION HCL ER (XL) 300 MG PO TB24
300.0000 mg | ORAL_TABLET | Freq: Every day | ORAL | 1 refills | Status: DC
Start: 2022-05-21 — End: 2022-09-03

## 2022-05-21 NOTE — Patient Instructions (Addendum)
Follow up in 3-4 months to recheck diabetes We'll notify you of your lab results and make any changes if needed Continue to work on low carb/low sugar diet Make sure you are drinking plenty of water Call with any questions or concerns Stay Safe!  Stay Healthy! HAPPY EARLY BIRTHDAY!!!

## 2022-05-21 NOTE — Progress Notes (Signed)
   Subjective:    Patient ID: Travis [name], male    DOB: 16-Dec-1955, 67 y.o.   MRN: [id number]  HPI DM- chronic problem, on Metformin 1000mg  BID.  UTD on eye exam, foot exam.  Due for microalbumin.  Pt reports home CBGs are running 150-200.  No change in diet.  Denies symptomatic lows.  Rare numbness and tingling hands/feet  Hyperlipidemia- chronic problem, on Crestor 20mg  daily and Fenofibrate 160mg  daily.  No abd pain, N/V.  HTN- chronic problem, on Amlodipine 10mg  daily, Lisinopril 40mg  daily, Metoprolol XL 100mg  daily, and Lasix 20mg  daily.  No CP, SOB, HA's, visual changes.  Obesity- pt is down 4 lbs since last visit.  Depression- ongoing issue.  Pt reports increased irritability.  Asking to increase Wellbutrin   Review of Systems For ROS see HPI     Objective:   Physical Exam Vitals reviewed.  Constitutional:      General: He is not in acute distress.    Appearance: Normal appearance. He is well-developed. He is obese. He is not ill-appearing.  HENT:     Head: Normocephalic and atraumatic.  Eyes:     Extraocular Movements: Extraocular movements intact.     Conjunctiva/sclera: Conjunctivae normal.     Pupils: Pupils are equal, round, and reactive to light.  Neck:     Thyroid: No thyromegaly.  Cardiovascular:     Rate and Rhythm: Normal rate and regular rhythm.     Pulses: Normal pulses.     Heart sounds: Normal heart sounds. No murmur heard. Pulmonary:     Effort: Pulmonary effort is normal. No respiratory distress.     Breath sounds: Normal breath sounds.  Abdominal:     General: Bowel sounds are normal. There is no distension.     Palpations: Abdomen is soft.  Musculoskeletal:     Cervical back: Normal range of motion and neck supple.     Right lower leg: No edema.     Left lower leg: No edema.  Lymphadenopathy:     Cervical: No cervical adenopathy.  Skin:    General: Skin is warm and dry.  Neurological:     General: No focal deficit present.     Mental  Status: He is alert and oriented to person, place, and time.     Cranial Nerves: No cranial nerve deficit.  Psychiatric:        Mood and Affect: Mood normal.        Behavior: Behavior normal.           Assessment & Plan:

## 2022-05-22 ENCOUNTER — Other Ambulatory Visit: Payer: Self-pay | Admitting: Pharmacist

## 2022-05-22 ENCOUNTER — Telehealth: Payer: Self-pay

## 2022-05-22 ENCOUNTER — Other Ambulatory Visit: Payer: Self-pay

## 2022-05-22 MED ORDER — EMPAGLIFLOZIN 10 MG PO TABS: 10.0000 mg | ORAL_TABLET | Freq: Every day | ORAL | 3 refills | Status: DC

## 2022-05-22 NOTE — Telephone Encounter (Signed)
Pt seen results via my chart and Jardiacne 10 mg has been sent in

## 2022-05-22 NOTE — Telephone Encounter (Signed)
-----   Message from Sheliah Hatch, MD sent at 05/22/2022  7:16 AM EDT ----- Your A1C has jumped from 6.6 --> 7.4%  This indicates worsening sugar control.  Based on this, we're going to add Jardiance 10mg  daily (#30, 3 refills) and continue to monitor closely.

## 2022-05-22 NOTE — Progress Notes (Signed)
05/22/2022 Name: Travis [name] MRN: [id number] DOB: 09-12-1955  Patient requested assistance with medication cost.  He was prescribed Jardiance yesterday due to increase in A1c. However per patient the cost of Jardiance was $240.  His wife, [name], is on [name] is has been approve [place] get thru [place].   Patient reports affordability concerns with their medications: Yes  - Jardiance Patient reports access/transportation concerns to their pharmacy: No  Patient reports adherence concerns with their medications:  No     Objective:  Lab Results  Component Value Date   HGBA1C 7.4 (H) 05/21/2022    Lab Results  Component Value Date   CREATININE 0.83 05/21/2022   BUN 18 05/21/2022   NA 137 05/21/2022   K 4.7 05/21/2022   CL 99 05/21/2022   CO2 27 05/21/2022    Lab Results  Component Value Date   CHOL 125 05/21/2022   HDL 34.80 (L) 05/21/2022   LDLCALC 57 10/31/2021   LDLDIRECT 69.0 05/21/2022   TRIG 238.0 (H) 05/21/2022   CHOLHDL 4 05/21/2022    Medications Reviewed Today     Reviewed by Sheliah Hatch, MD (Physician) on 05/21/22 at [phone]  Med List Status: <None>   Medication Order Taking? Sig Documenting Provider Last Dose Status Informant  acetaminophen (TYLENOL) 500 MG tablet [id number] Yes Take 1,000 mg by mouth every 6 (six) hours as needed for moderate pain. Taking 650 mg arthritis BID [provider] Taking Active   albuterol (VENTOLIN HFA) 108 (90 Base) MCG/ACT inhaler [id number] Yes TAKE 2 PUFFS BY MOUTH EVERY 6 HOURS AS NEEDED FOR WHEEZE OR SHORTNESS OF BREATH Sheliah Hatch, MD Taking Active   allopurinol (ZYLOPRIM) 100 MG tablet [id number] Yes TAKE 2 TABLETS BY MOUTH EVERY DAY Judi Saa, DO Taking Active   amLODipine (NORVASC) 10 MG tablet [id number] Yes TAKE 1 TABLET BY MOUTH EVERY DAY Sheliah Hatch, MD Taking Active   APPLE CIDER VINEGAR PO [id number] Yes Take 450 mg by mouth. [provider] Taking Active   Ascorbic  Acid (VITAMIN C) 500 MG CAPS [id number] Yes Take by mouth. [provider] Taking Active   aspirin EC 81 MG tablet [id number] Yes Take 81 mg by mouth daily. [provider] Taking Active Self  Blood Glucose Monitoring Suppl (FREESTYLE LITE) DEVI [id number] Yes Use to test sugars twice daily.Dx E11.9 Sheliah Hatch, MD Taking Active   buPROPion (WELLBUTRIN XL) 150 MG 24 hr tablet [id number] Yes TAKE 1 TABLET BY MOUTH EVERY DAY Sheliah Hatch, MD Taking Active   celecoxib (CELEBREX) 200 MG capsule [id number] Yes TAKE 1 CAPSULE BY MOUTH TWICE A DAY Sheliah Hatch, MD Taking Active   Cholecalciferol (VITAMIN D3 PO) [id number] Yes Take 2,000 Units by mouth daily.  [provider] Taking Active Self  Coconut Oil 1000 MG CAPS [id number] Yes Take by mouth. [provider] Taking Active   Cyanocobalamin (VITAMIN B-12 PO) [id number] Yes Take 1,000 mcg by mouth.  [provider] Taking Active   fenofibrate 160 MG tablet [id number] Yes TAKE 1 TABLET BY MOUTH EVERY DAY Sheliah Hatch, MD Taking Active   furosemide (LASIX) 20 MG tablet [id number] Yes TAKE 1 TO 2 TABLETS BY MOUTH TWICE A DAY AS NEEDED FOR EDEMA Sheliah Hatch, MD Taking Active   gabapentin (NEURONTIN) 300 MG capsule [id number] Yes TAKE 1 CAPSULE BY MOUTH THREE TIMES A DAY Sheliah Hatch, MD Taking Active   Garlic 10 MG CAPS [id number] Yes  Take by mouth. [provider] Taking Active   glucosamine-chondroitin 500-400 MG tablet [id number] Yes Take 1 tablet by mouth 3 (three) times daily. [provider] Taking Active   glucose blood (FREESTYLE LITE) test strip [id number] Yes Use to check blood glucose once daily (Dx: E 11.9 - type 2 DM without long term insulin use) Sheliah Hatch, MD Taking Active   Lancets (FREESTYLE) lancets [id number] Yes Use to check blood glucose once daily (Dx: E 11.9 - type 2 DM without long term insulin use) Sheliah Hatch, MD Taking Active    lisinopril (ZESTRIL) 40 MG tablet [id number] Yes TAKE 1 TABLET BY MOUTH EVERY DAY Sheliah Hatch, MD Taking Active   magnesium gluconate (MAGONATE) 500 MG tablet [id number] Yes Take 500 mg by mouth daily.  [provider] Taking Active Self  metFORMIN (GLUCOPHAGE) 500 MG tablet [id number] Yes TAKE 2 TABLETS BY MOUTH 2 TIMES DAILY WITH A MEAL. Sheliah Hatch, MD Taking Active   metoprolol succinate (TOPROL-XL) 100 MG 24 hr tablet [id number] Yes Take 1 tablet (100 mg total) by mouth daily. Ronney Asters, NP Taking Active   milk thistle 175 MG tablet [id number] Yes Take 175 mg by mouth daily. [provider] Taking Active   Misc Natural Products Laney Pastor CHERRY ADVANCED PO) [id number] Yes Take 1,200 mg by mouth daily.  [provider] Taking Active Self  Omega-3 Fatty Acids (SALMON OIL-1000 PO) [id number] Yes Take by mouth. [provider] Taking Active            Med Note Dione Housekeeper, Kathreen Cosier   Tue Feb 21, 2021  8:40 AM) Patient thinks he takes 1250 daily  Potassium 99 MG TABS [id number] Yes Take 99 mg by mouth. [provider] Taking Active   rosuvastatin (CRESTOR) 20 MG tablet [id number] Yes TAKE 1 TABLET BY MOUTH EVERY DAY Sheliah Hatch, MD Taking Active   sildenafil (VIAGRA) 100 MG tablet [id number] Yes TAKE 1/2 TO 1 TABLET DAILY AS NEEDED Sheliah Hatch, MD Taking Active   traZODone (DESYREL) 100 MG tablet [id number] Yes TAKE 1 TABLET BY MOUTH EVERYDAY AT BEDTIME Sheliah Hatch, MD Taking Active   Turmeric Curcumin 500 MG CAPS [id number] Yes Take by mouth. [provider] Taking Active Self  zinc gluconate 50 MG tablet [id number] Yes Take 50 mg by mouth daily. [provider] Taking Active Self              Assessment/Plan:  Type 2 DM - A1c not at goal  Checked patient's insurance. [name] is on formulary but patient has to meet $280 before copay changes to $47 per month. He will likely reach coverage gap after  about 6 months and then cost would be about $150. [name] would have similar cost.  Patient's wife has qualified for [place] and gets [name] from them. I think he would also qualify or could also check Jardiance medication assistance program but takes a little more time than [place].  Consulting with PCP to see if she is OK with either applying for Jardiance or Farxiga medication assistance program.     Henrene Pastor, PharmD Clinical Pharmacist [facility]

## 2022-05-23 ENCOUNTER — Other Ambulatory Visit: Payer: Self-pay

## 2022-05-23 ENCOUNTER — Encounter: Payer: Self-pay | Admitting: Family Medicine

## 2022-05-23 ENCOUNTER — Ambulatory Visit (INDEPENDENT_AMBULATORY_CARE_PROVIDER_SITE_OTHER): Payer: Medicare Other | Admitting: *Deleted

## 2022-05-23 DIAGNOSIS — Z0000 Encounter for general adult medical examination without abnormal findings: Secondary | ICD-10-CM

## 2022-05-23 MED ORDER — ALLOPURINOL 100 MG PO TABS: 200.0000 mg | ORAL_TABLET | Freq: Every day | ORAL | 0 refills | Status: DC

## 2022-05-23 MED ORDER — DAPAGLIFLOZIN PROPANEDIOL 5 MG PO TABS
5.0000 mg | ORAL_TABLET | Freq: Every day | ORAL | 3 refills | Status: DC
Start: 2022-05-23 — End: 2022-05-23

## 2022-05-23 MED ORDER — DAPAGLIFLOZIN PROPANEDIOL 5 MG PO TABS: 5.0000 mg | ORAL_TABLET | Freq: Every day | ORAL | 2 refills | Status: DC

## 2022-05-23 NOTE — Patient Instructions (Signed)
Travis [name] , Thank you for taking time to come for your Medicare Wellness Visit. I appreciate your ongoing commitment to your health goals. Please review the following plan we discussed and let me know if I can assist you in the future.   Screening recommendations/referrals: Colonoscopy: up to date Recommended yearly ophthalmology/optometry visit for glaucoma screening and checkup Recommended yearly dental visit for hygiene and checkup  Vaccinations: Influenza vaccine: Education provided Pneumococcal vaccine: Education provided Tdap vaccine: up to date Shingles vaccine: Education provided    Advanced directives: Education provided    Preventive Care 66 Years and Older, Male Preventive care refers to lifestyle choices and visits with your health care provider that can promote health and wellness. What does preventive care include? A yearly physical exam. This is also called an annual well check. Dental exams once or twice a year. Routine eye exams. Ask your health care provider how often you should have your eyes checked. Personal lifestyle choices, including: Daily care of your teeth and gums. Regular physical activity. Eating a healthy diet. Avoiding tobacco and drug use. Limiting alcohol use. Practicing safe sex. Taking low doses of aspirin every day. Taking vitamin and mineral supplements as recommended by your health care provider. What happens during an annual well check? The services and screenings done by your health care provider during your annual well check will depend on your age, overall health, lifestyle risk factors, and family history of disease. Counseling  Your health care provider may ask you questions about your: Alcohol use. Tobacco use. Drug use. Emotional well-being. Home and relationship well-being. Sexual activity. Eating habits. History of falls. Memory and ability to understand (cognition). Work and work Astronomer. Screening  You may have  the following tests or measurements: Height, weight, and BMI. Blood pressure. Lipid and cholesterol levels. These may be checked every 5 years, or more frequently if you are over 39 years old. Skin check. Lung cancer screening. You may have this screening every year starting at age 54 if you have a 30-pack-year history of smoking and currently smoke or have quit within the past 15 years. Fecal occult blood test (FOBT) of the stool. You may have this test every year starting at age 72. Flexible sigmoidoscopy or colonoscopy. You may have a sigmoidoscopy every 5 years or a colonoscopy every 10 years starting at age 18. Prostate cancer screening. Recommendations will vary depending on your family history and other risks. Hepatitis C blood test. Hepatitis B blood test. Sexually transmitted disease (STD) testing. Diabetes screening. This is done by checking your blood sugar (glucose) after you have not eaten for a while (fasting). You may have this done every 1-3 years. Abdominal aortic aneurysm (AAA) screening. You may need this if you are a current or former smoker. Osteoporosis. You may be screened starting at age 66 if you are at high risk. Talk with your health care provider about your test results, treatment options, and if necessary, the need for more tests. Vaccines  Your health care provider may recommend certain vaccines, such as: Influenza vaccine. This is recommended every year. Tetanus, diphtheria, and acellular pertussis (Tdap, Td) vaccine. You may need a Td booster every 10 years. Zoster vaccine. You may need this after age 51. Pneumococcal 13-valent conjugate (PCV13) vaccine. One dose is recommended after age 66. Pneumococcal polysaccharide (PPSV23) vaccine. One dose is recommended after age 33. Talk to your health care provider about which screenings and vaccines you need and how often you need them. This information is  not intended to replace advice given to you by your health  care provider. Make sure you discuss any questions you have with your health care provider. Document Released: 01/28/2015 Document Revised: 09/21/2015 Document Reviewed: 11/02/2014 Elsevier Interactive Patient Education  2017 [place] Prevention in the Home Falls can cause injuries. They can happen to people of all ages. There are many things you can do to make your home safe and to help prevent falls. What can I do on the outside of my home? Regularly fix the edges of walkways and driveways and fix any cracks. Remove anything that might make you trip as you walk through a door, such as a raised step or threshold. Trim any bushes or trees on the path to your home. Use bright outdoor lighting. Clear any walking paths of anything that might make someone trip, such as rocks or tools. Regularly check to see if handrails are loose or broken. Make sure that both sides of any steps have handrails. Any raised decks and porches should have guardrails on the edges. Have any leaves, snow, or ice cleared regularly. Use sand or salt on walking paths during winter. Clean up any spills in your garage right away. This includes oil or grease spills. What can I do in the bathroom? Use night lights. Install grab bars by the toilet and in the tub and shower. Do not use towel bars as grab bars. Use non-skid mats or decals in the tub or shower. If you need to sit down in the shower, use a plastic, non-slip stool. Keep the floor dry. Clean up any water that spills on the floor as soon as it happens. Remove soap buildup in the tub or shower regularly. Attach bath mats securely with double-sided non-slip rug tape. Do not have throw rugs and other things on the floor that can make you trip. What can I do in the bedroom? Use night lights. Make sure that you have a light by your bed that is easy to reach. Do not use any sheets or blankets that are too big for your bed. They should not hang down onto the  floor. Have a firm chair that has side arms. You can use this for support while you get dressed. Do not have throw rugs and other things on the floor that can make you trip. What can I do in the kitchen? Clean up any spills right away. Avoid walking on wet floors. Keep items that you use a lot in easy-to-reach places. If you need to reach something above you, use a strong step stool that has a grab bar. Keep electrical cords out of the way. Do not use floor polish or wax that makes floors slippery. If you must use wax, use non-skid floor wax. Do not have throw rugs and other things on the floor that can make you trip. What can I do with my stairs? Do not leave any items on the stairs. Make sure that there are handrails on both sides of the stairs and use them. Fix handrails that are broken or loose. Make sure that handrails are as long as the stairways. Check any carpeting to make sure that it is firmly attached to the stairs. Fix any carpet that is loose or worn. Avoid having throw rugs at the top or bottom of the stairs. If you do have throw rugs, attach them to the floor with carpet tape. Make sure that you have a light switch at the top of the  stairs and the bottom of the stairs. If you do not have them, ask someone to add them for you. What else can I do to help prevent falls? Wear shoes that: Do not have high heels. Have rubber bottoms. Are comfortable and fit you well. Are closed at the toe. Do not wear sandals. If you use a stepladder: Make sure that it is fully opened. Do not climb a closed stepladder. Make sure that both sides of the stepladder are locked into place. Ask someone to hold it for you, if possible. Clearly mark and make sure that you can see: Any grab bars or handrails. First and last steps. Where the edge of each step is. Use tools that help you move around (mobility aids) if they are needed. These include: Canes. Walkers. Scooters. Crutches. Turn on the  lights when you go into a dark area. Replace any light bulbs as soon as they burn out. Set up your furniture so you have a clear path. Avoid moving your furniture around. If any of your floors are uneven, fix them. If there are any pets around you, be aware of where they are. Review your medicines with your doctor. Some medicines can make you feel dizzy. This can increase your chance of falling. Ask your doctor what other things that you can do to help prevent falls. This information is not intended to replace advice given to you by your health care provider. Make sure you discuss any questions you have with your health care provider. Document Released: 10/28/2008 Document Revised: 06/09/2015 Document Reviewed: 02/05/2014 Elsevier Interactive Patient Education  2017 [employer].

## 2022-05-23 NOTE — Progress Notes (Signed)
Switched to [place] due to cost.  Agree w/ application for pt assistance.

## 2022-05-23 NOTE — Progress Notes (Signed)
Addendum 05/23/2022 - Assisted patient with applying for medication assistance program for Farxiga. Patient approved thru 01/15/2023 - sent Rx for MedVantx.  Patient did received 30 day supply for $47 at pharmacy to last until he received first shipment.

## 2022-05-23 NOTE — Addendum Note (Signed)
Addended by: Henrene Pastor B on: 05/23/2022 01:54 PM   Modules accepted: Orders

## 2022-05-23 NOTE — Addendum Note (Signed)
Addended by: Sheliah Hatch on: 05/23/2022 07:16 AM   Modules accepted: Orders

## 2022-05-23 NOTE — Progress Notes (Signed)
Subjective:   Travis [name] is a 67 y.o. male who presents for an Initial Medicare Annual Wellness Visit.  I connected with  [name] on 05/23/22 by a telephone enabled telemedicine application and verified that I am speaking with the correct person using two identifiers.   I discussed the limitations of evaluation and management by telemedicine. The patient expressed understanding and agreed to proceed.  Patient location: home  Provider location: telephone/home     Review of Systems     Cardiac Risk Factors include: advanced age (>44men, >31 women);diabetes mellitus;male gender;obesity (BMI >30kg/m2);family history of premature cardiovascular disease     Objective:    Today's Vitals   There is no height or weight on file to calculate BMI.     05/06/2019    8:13 AM 05/04/2019   10:26 AM 04/17/2019    4:00 AM 04/16/2019    5:29 AM 10/14/2014    9:07 PM 09/14/2014   10:34 AM 05/25/2014    2:30 PM  Advanced Directives  Does Patient Have a Medical Advance Directive? No No No No No No No  Would patient like information on creating a medical advance directive? No - Patient declined No - Patient declined No - Patient declined No - Patient declined No - patient declined information No - patient declined information No - patient declined information    Current Medications (verified) Outpatient Encounter Medications as of 05/23/2022  Medication Sig   acetaminophen (TYLENOL) 500 MG tablet Take 1,000 mg by mouth every 6 (six) hours as needed for moderate pain. Taking 650 mg arthritis BID   albuterol (VENTOLIN HFA) 108 (90 Base) MCG/ACT inhaler TAKE 2 PUFFS BY MOUTH EVERY 6 HOURS AS NEEDED FOR WHEEZE OR SHORTNESS OF BREATH   allopurinol (ZYLOPRIM) 100 MG tablet TAKE 2 TABLETS BY MOUTH EVERY DAY   amLODipine (NORVASC) 10 MG tablet TAKE 1 TABLET BY MOUTH EVERY DAY   APPLE CIDER VINEGAR PO Take 450 mg by mouth.   Ascorbic Acid (VITAMIN C) 500 MG CAPS Take by mouth.   aspirin EC 81 MG  tablet Take 81 mg by mouth daily.   Blood Glucose Monitoring Suppl (FREESTYLE LITE) DEVI Use to test sugars twice daily.Dx E11.9   buPROPion (WELLBUTRIN XL) 300 MG 24 hr tablet Take 1 tablet (300 mg total) by mouth daily.   celecoxib (CELEBREX) 200 MG capsule TAKE 1 CAPSULE BY MOUTH TWICE A DAY   Cholecalciferol (VITAMIN D3 PO) Take 2,000 Units by mouth daily.    Coconut Oil 1000 MG CAPS Take by mouth.   Cyanocobalamin (VITAMIN B-12 PO) Take 1,000 mcg by mouth.    dapagliflozin propanediol (FARXIGA) 5 MG TABS tablet Take 1 tablet (5 mg total) by mouth daily before breakfast.   fenofibrate 160 MG tablet TAKE 1 TABLET BY MOUTH EVERY DAY   furosemide (LASIX) 20 MG tablet TAKE 1 TO 2 TABLETS BY MOUTH TWICE A DAY AS NEEDED FOR EDEMA   gabapentin (NEURONTIN) 300 MG capsule TAKE 1 CAPSULE BY MOUTH THREE TIMES A DAY   Garlic 10 MG CAPS Take by mouth.   glucosamine-chondroitin 500-400 MG tablet Take 1 tablet by mouth 3 (three) times daily.   glucose blood (FREESTYLE LITE) test strip Use to check blood glucose once daily (Dx: E 11.9 - type 2 DM without long term insulin use)   Lancets (FREESTYLE) lancets Use to check blood glucose once daily (Dx: E 11.9 - type 2 DM without long term insulin use)   lisinopril (ZESTRIL)  40 MG tablet TAKE 1 TABLET BY MOUTH EVERY DAY   magnesium gluconate (MAGONATE) 500 MG tablet Take 500 mg by mouth daily.    metFORMIN (GLUCOPHAGE) 500 MG tablet TAKE 2 TABLETS BY MOUTH 2 TIMES DAILY WITH A MEAL.   metoprolol succinate (TOPROL-XL) 100 MG 24 hr tablet Take 1 tablet (100 mg total) by mouth daily. KEEP OV.   milk thistle 175 MG tablet Take 175 mg by mouth daily.   Misc Natural Products (TART CHERRY ADVANCED PO) Take 1,200 mg by mouth daily.    Omega-3 Fatty Acids (SALMON OIL-1000 PO) Take by mouth.   Potassium 99 MG TABS Take 99 mg by mouth.   rosuvastatin (CRESTOR) 20 MG tablet TAKE 1 TABLET BY MOUTH EVERY DAY   sildenafil (VIAGRA) 100 MG tablet TAKE 1/2 TO 1 TABLET DAILY AS  NEEDED   traZODone (DESYREL) 100 MG tablet TAKE 1 TABLET BY MOUTH EVERYDAY AT BEDTIME   Turmeric Curcumin 500 MG CAPS Take by mouth.   zinc gluconate 50 MG tablet Take 50 mg by mouth daily.   No facility-administered encounter medications on file as of 05/23/2022.    Allergies (verified) Crestor [rosuvastatin] and Lipitor [atorvastatin]   History: Past Medical History:  Diagnosis Date   Appendicitis    Blood in stool    Coronary artery disease 1997   s/p PCI by Dr. Aleen Campi   Diverticulosis of colon with hemorrhage 2009   Dyslipidemia, goal LDL below 70    Edema    Encounter for long-term (current) use of other medications    Essential hypertension, benign    Family history of thyroid disease    Frequent unifocal PVCs    GBS (Guillain Barre syndrome) (HCC)    Morbid obesity (HCC)    Myalgia and myositis, unspecified    Prostate cancer (HCC) 2012   Pulmonary HTN (HCC) 08/14/2018   PASP by echo 2017   Sleep apnea with use of continuous positive airway pressure (CPAP)    Thrombocytopenia (HCC) 05/25/2014   Type II or unspecified type diabetes mellitus without mention of complication, not stated as uncontrolled    Past Surgical History:  Procedure Laterality Date   ANGIOPLASTY  1997   Dr. Aleen Campi   APPENDECTOMY     CATARACT EXTRACTION Left 05/18/2020   CATARACT EXTRACTION Right 06/08/2020   radioactive seed prostate     Family History  Problem Relation Age of Onset   CVA Father    Hypertension Father    Diabetes Mother    Hypothyroidism Sister    Hypertension Sister    Social History   Socioeconomic History   Marital status: Married    Spouse name: Not on file   Number of children: 0   Years of education: 12   Highest education level: 12th grade  Occupational History   Occupation: Building surveyor: [employer]    Comment: mod/heavy lifting  Tobacco Use   Smoking status: Never   Smokeless tobacco: Never  Vaping Use   Vaping Use: Never  used  Substance and Sexual Activity   Alcohol use: No   Drug use: No   Sexual activity: Not on file  Other Topics Concern   Not on file  Social History Narrative   Lives at home with his wife.   Right-handed.   Caffeine: occasional use.   Social Determinants of Health   Financial Resource Strain: Low Risk  (05/23/2022)   Overall Financial Resource Strain (CARDIA)    Difficulty  of Paying Living Expenses: Not hard at all  Food Insecurity: No Food Insecurity (05/23/2022)   Hunger Vital Sign    Worried About Running Out of Food in the Last Year: Never true    Ran Out of Food in the Last Year: Never true  Transportation Needs: No Transportation Needs (05/23/2022)   PRAPARE - Administrator, Civil Service (Medical): No    Lack of Transportation (Non-Medical): No  Physical Activity: Inactive (05/23/2022)   Exercise Vital Sign    Days of Exercise per Week: 0 days    Minutes of Exercise per Session: 0 min  Stress: No Stress Concern Present (05/23/2022)   [employer] of Occupational Health - Occupational Stress Questionnaire    Feeling of Stress : Not at all  Recent Concern: Stress - Stress Concern Present (05/21/2022)   [employer] of Occupational Health - Occupational Stress Questionnaire    Feeling of Stress : To some extent  Social Connections: Moderately Isolated (05/23/2022)   Social Connection and Isolation Panel [NHANES]    Frequency of Communication with Friends and Family: More than three times a week    Frequency of Social Gatherings with Friends and Family: Once a week    Attends Religious Services: Never    Database administrator or Organizations: No    Attends Engineer, structural: Never    Marital Status: Married    Tobacco Counseling Counseling given: Not Answered   Clinical Intake:  Pre-visit preparation completed: Yes  Pain : No/denies pain     Diabetes: Yes CBG done?: No Did pt. bring in CBG monitor from home?: No  How often do  you need to have someone help you when you read instructions, pamphlets, or other written materials from your doctor or pharmacy?: 1 - Never  Diabetic?  Yes  Nutrition Risk Assessment:  Has the patient had any N/V/D within the last 2 months?  No  Does the patient have any non-healing wounds?  No  Has the patient had any unintentional weight loss or weight gain?  No   Diabetes:  Is the patient diabetic?  Yes  If diabetic, was a CBG obtained today?  No  Did the patient bring in their glucometer from home?  No  How often do you monitor your CBG's?   1-3 times a day.   Financial Strains and Diabetes Management:  Are you having any financial strains with the device, your supplies or your medication? No .  Does the patient want to be seen by Chronic Care Management for management of their diabetes?  No  Would the patient like to be referred to a Nutritionist or for Diabetic Management?  No   Diabetic Exams:  Diabetic Eye Exam: Completed .  Pt has been advised about the importance in completing this exam . A referral has been placed today. Message sent to referral coordinator for scheduling purposes. Advised pt to expect a call from office referred to regarding appt.  Diabetic Foot Exam: Completed . Pt has been advised about the importance in completing this exam.   Interpreter Needed?: No  Information entered by :: Remi Haggard LPN   Activities of Daily Living    05/23/2022    8:39 AM 05/22/2022    9:11 AM  In your present state of health, do you have any difficulty performing the following activities:  Hearing? 0 0  Vision? 0 0  Difficulty concentrating or making decisions? 0 0  Walking or climbing stairs?  1  Dressing or bathing? 0 0  Doing errands, shopping? 0 0  Preparing Food and eating ? N N  Using the Toilet? N N  In the past six months, have you accidently leaked urine? Y Y  Do you have problems with loss of bowel control? N N  Managing your Medications? N N  Managing  your Finances? N N  Housekeeping or managing your Housekeeping? N N    Patient Care Team: Sheliah Hatch, MD as PCP - General (Family Medicine) Quintella Reichert, MD as Consulting Physician (Cardiology) Waymon Budge, MD as Consulting Physician (Pulmonary Disease) Ihor Gully, MD (Inactive) as Consulting Physician (Urology) Carman Ching, MD (Inactive) as Consulting Physician (Gastroenterology)  Indicate any recent Medical Services you may have received from other than [facility] providers in the past year (date may be approximate).     Assessment:   This is a routine wellness examination for [facility].  Hearing/Vision screen Hearing Screening - Comments:: No trouble hearing Vision Screening - Comments:: [facility] Up to date  Dietary issues and exercise activities discussed:     Goals Addressed             This Visit's Progress    Weight (lb) < 200 lb (90.7 kg)         Depression Screen    05/23/2022    8:43 AM 05/21/2022    8:25 AM 02/19/2022    8:10 AM 10/31/2021    8:15 AM 08/03/2021    9:16 AM 04/19/2021    9:10 AM 01/18/2021    8:19 AM  PHQ 2/9 Scores  PHQ - 2 Score 2 2 4 2 2  0 2  PHQ- 9 Score 6 10 14 11 8 2 8     Fall Risk    05/23/2022    8:36 AM 05/22/2022    9:11 AM 05/21/2022    8:25 AM 02/19/2022    8:10 AM 10/31/2021    8:16 AM  Fall Risk   Falls in the past year? 0 0 0 0 0  Number falls in past yr: 0  0 0   Injury with Fall? 0  0 0   Risk for fall due to : Impaired balance/gait  No Fall Risks No Fall Risks No Fall Risks  Follow up Falls evaluation completed;Education provided;Falls prevention discussed  Falls evaluation completed Falls evaluation completed Falls evaluation completed    FALL RISK PREVENTION PERTAINING TO THE HOME:  Any stairs in or around the home? No  If so, are there any without handrails? No  Home free of loose throw rugs in walkways, pet beds, electrical cords, etc? Yes  Adequate lighting in your home to reduce risk of falls? Yes    ASSISTIVE DEVICES UTILIZED TO PREVENT FALLS:  Life alert? No  Use of a cane, walker or w/c? Yes  Grab bars in the bathroom? Yes  Shower chair or bench in shower? Yes  Elevated toilet seat or a handicapped toilet? No   TIMED UP AND GO:  Was the test performed? No .    Cognitive Function:        05/23/2022    8:40 AM  6CIT Screen  What Year? 0 points  What month? 0 points  What time? 0 points  Count back from 20 0 points  Months in reverse 0 points  Repeat phrase 0 points  Total Score 0 points    Immunizations Immunization History  Administered Date(s) Administered   Hepatitis B 01/17/2016   Influenza  Split 09/26/2015   Influenza,inj,Quad PF,6+ Mos 10/07/2015, 10/31/2016, 09/30/2017, 10/15/2018, 03/01/2020, 10/06/2020   MMR 05/31/2015   PPD Test 06/08/2009   Pneumococcal Polysaccharide-23 07/28/2014   Td 06/15/1993   Tdap 06/15/2006, 06/13/2016    TDAP status: Up to date  Flu Vaccine status: Due, Education has been provided regarding the importance of this vaccine. Advised may receive this vaccine at local pharmacy or Health Dept. Aware to provide a copy of the vaccination record if obtained from local pharmacy or Health Dept. Verbalized acceptance and understanding.  Pneumococcal vaccine status: Due, Education has been provided regarding the importance of this vaccine. Advised may receive this vaccine at local pharmacy or Health Dept. Aware to provide a copy of the vaccination record if obtained from local pharmacy or Health Dept. Verbalized acceptance and understanding.  Covid-19 vaccine status: Declined, Education has been provided regarding the importance of this vaccine but patient still declined. Advised may receive this vaccine at local pharmacy or Health Dept.or vaccine clinic. Aware to provide a copy of the vaccination record if obtained from local pharmacy or Health Dept. Verbalized acceptance and understanding.  Qualifies for Shingles Vaccine? Yes    Zostavax completed No   Shingrix Completed?: No.    Education has been provided regarding the importance of this vaccine. Patient has been advised to call insurance company to determine out of pocket expense if they have not yet received this vaccine. Advised may also receive vaccine at local pharmacy or Health Dept. Verbalized acceptance and understanding.  Screening Tests Health Maintenance  Topic Date Due   Pneumonia Vaccine 55+ Years old (2 of 2 - PCV) 02/20/2023 (Originally 06/05/2021)   FOOT EXAM  06/29/2022   INFLUENZA VACCINE  08/16/2022   HEMOGLOBIN A1C  11/21/2022   OPHTHALMOLOGY EXAM  01/06/2023   Diabetic kidney evaluation - eGFR measurement  05/21/2023   Diabetic kidney evaluation - Urine ACR  05/21/2023   Medicare Annual Wellness (AWV)  05/23/2023   DTaP/Tdap/Td (4 - Td or Tdap) 06/14/2026   COLONOSCOPY (Pts 45-53yrs Insurance coverage will need to be confirmed)  12/07/2028   Hepatitis C Screening  Completed   HIV Screening  Completed   HPV VACCINES  Aged Out   COVID-19 Vaccine  Discontinued   Zoster Vaccines- Shingrix  Discontinued    Health Maintenance  There are no preventive care reminders to display for this patient.   Colorectal cancer screening: Type of screening: Colonoscopy. Completed 2020. Repeat every 10 years  Lung Cancer Screening: (Low Dose CT Chest recommended if Age 39-80 years, 30 pack-year currently smoking OR have quit w/in 15years.) does not qualify.   Lung Cancer Screening Referral:   Additional Screening:  Hepatitis C Screening   never done  Vision Screening: Recommended annual ophthalmology exams for early detection of glaucoma and other disorders of the eye. Is the patient up to date with their annual eye exam?  Yes  Who is the provider or what is the name of the office in which the patient attends annual eye exams? [facility] If pt is not established with a provider, would they like to be referred to a provider to establish care? No .    Dental Screening: Recommended annual dental exams for proper oral hygiene  Community Resource Referral / Chronic Care Management: CRR required this visit?  No   CCM required this visit?  No      Plan:     I have personally reviewed and noted the following in the patient's chart:   Medical  and social history Use of alcohol, tobacco or illicit drugs  Current medications and supplements including opioid prescriptions. Patient is not currently taking opioid prescriptions. Functional ability and status Nutritional status Physical activity Advanced directives List of other physicians Hospitalizations, surgeries, and ER visits in previous 12 months Vitals Screenings to include cognitive, depression, and falls Referrals and appointments  In addition, I have reviewed and discussed with patient certain preventive protocols, quality metrics, and best practice recommendations. A written personalized care plan for preventive services as well as general preventive health recommendations were provided to patient.     Remi Haggard, LPN   01/15/9145   Nurse Notes:

## 2022-05-24 ENCOUNTER — Other Ambulatory Visit: Payer: Self-pay | Admitting: Family Medicine

## 2022-05-29 NOTE — Assessment & Plan Note (Signed)
Chronic problem.  On Metformin 1000mg  BID.  UTD on eye exam, foot exam.  Microalbumin ordered.  Asymptomatic w/ exception of rare numbness/tingling of hands/feet.  Check labs.  Adjust meds prn

## 2022-05-29 NOTE — Assessment & Plan Note (Signed)
Chronic problem.  On Amlodipine 10mg  daily, Lisinopril 40mg  daily, Metoprolol XL 100mg  daily, and Lasix 20mg  daily w/ adequate control.  Currently asymptomatic.  Check labs due to ACE and diuretic but no anticipated med changes.  Will follow.

## 2022-06-03 NOTE — Assessment & Plan Note (Signed)
Ongoing issue.  Pt is down 4 lbs since last visit.  No regular exercise.  Encouraged low carb diet.  Will follow.

## 2022-06-03 NOTE — Assessment & Plan Note (Signed)
Chronic problem.  On Crestor 20mg daily and Fenofibrate 160mg daily w/o difficulty.  Check labs.  Adjust meds prn  

## 2022-06-03 NOTE — Assessment & Plan Note (Signed)
Deteriorated.  Pt reports increased irritability.  Will increase Wellbutrin to 300mg  daily.  Pt expressed understanding and is in agreement w/ plan.

## 2022-06-12 NOTE — Progress Notes (Unsigned)
Travis [name] [address] [place] [address] Phone: [phone] Subjective:    I'm seeing this patient by the request  of:  Sheliah Hatch, MD  CC: Bilateral knee pain  [id number]  04/18/2022 Chronic problem with exacerbation patient still social determinants of health include patient's weight at this time.  I do need to get BMI under 40 to even be a surgical candidate for any type of replacement.  Patient also a type II diabetic.  Decrease in physical activity and difficulty even walking long distances.  We discussed with patient about icing regimen and home exercises.  Does seem to respond relatively well to every 8 to 9-week intervals for injections.  Follow-up with me again in that timeframe for further evaluation and management    Update 06/13/2022 Travis [name] is a 67 y.o. male coming in with complaint of B knee pain. Patient states he is ready for bilat knee injections patient has had more pain recently.  Affecting daily activities.    Past Medical History:  Diagnosis Date   Appendicitis    Blood in stool    Coronary artery disease 1997   s/p PCI by Dr. Aleen Campi   Diverticulosis of colon with hemorrhage 2009   Dyslipidemia, goal LDL below 70    Edema    Encounter for long-term (current) use of other medications    Essential hypertension, benign    Family history of thyroid disease    Frequent unifocal PVCs    GBS (Guillain Barre syndrome) (HCC)    Morbid obesity (HCC)    Myalgia and myositis, unspecified    Prostate cancer (HCC) 2012   Pulmonary HTN (HCC) 08/14/2018   PASP by echo 2017   Sleep apnea with use of continuous positive airway pressure (CPAP)    Thrombocytopenia (HCC) 05/25/2014   Type II or unspecified type diabetes mellitus without mention of complication, not stated as uncontrolled    Past Surgical History:  Procedure Laterality Date   ANGIOPLASTY  1997   Dr. 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   Right-handed.   Caffeine: occasional use.   Social Determinants of Health   Financial Resource Strain: Low Risk  (05/23/2022)   Overall Financial Resource Strain (CARDIA)    Difficulty of Paying Living Expenses: Not hard at all  Food Insecurity: No Food Insecurity (05/23/2022)   Hunger Vital Sign    Worried About Running Out of Food in the Last Year: Never true    Ran Out of Food in the Last Year: Never true  Transportation Needs: No Transportation Needs (05/23/2022)   PRAPARE - Transportation    Lack  of Transportation (Medical): No    Lack of Transportation (Non-Medical): No  Physical Activity: Inactive (05/23/2022)   Exercise Vital Sign    Days of Exercise per Week: 0 days    Minutes of Exercise per Session: 0 min  Stress: No Stress Concern Present (05/23/2022)   [employer] of Occupational Health - Occupational Stress Questionnaire    Feeling of Stress : Not at all  Recent Concern: Stress - Stress Concern Present (05/21/2022)   [employer] of Occupational Health - Occupational Stress Questionnaire    Feeling of Stress : To some extent  Social Connections: Moderately Isolated (05/23/2022)   Social Connection and Isolation Panel [NHANES]    Frequency of Communication with Friends and Family:  More than three times a week    Frequency of Social Gatherings with Friends and Family: Once a week    Attends Religious Services: Never    Database administrator or Organizations: No    Attends Engineer, structural: Never    Marital Status: Married   Allergies  Allergen Reactions   Crestor [Rosuvastatin]     Muscle Pain    Lipitor [Atorvastatin] Other (See Comments)    Arm weakness    Family History  Problem Relation Age of Onset   CVA Father    Hypertension Father    Diabetes Mother    Hypothyroidism Sister    Hypertension Sister     Current Outpatient Medications (Endocrine & Metabolic):    dapagliflozin propanediol (FARXIGA) 5 MG TABS tablet, Take 1 tablet (5 mg total) by mouth daily before breakfast.   metFORMIN (GLUCOPHAGE) 500 MG tablet, TAKE 2 TABLETS BY MOUTH 2 TIMES DAILY WITH A MEAL.  Current Outpatient Medications (Cardiovascular):    amLODipine (NORVASC) 10 MG tablet, TAKE 1 TABLET BY MOUTH EVERY DAY   fenofibrate 160 MG tablet, TAKE 1 TABLET BY MOUTH EVERY DAY   furosemide (LASIX) 20 MG tablet, TAKE 1 TO 2 TABLETS BY MOUTH TWICE A DAY AS NEEDED FOR EDEMA   lisinopril (ZESTRIL) 40 MG tablet, TAKE 1 TABLET BY MOUTH EVERY DAY   metoprolol succinate (TOPROL-XL) 100 MG 24 hr tablet, Take 1 tablet (100 mg total) by mouth daily. KEEP OV.   rosuvastatin (CRESTOR) 20 MG tablet, TAKE 1 TABLET BY MOUTH EVERY DAY   sildenafil (VIAGRA) 100 MG tablet, TAKE 1/2 TO 1 TABLET DAILY AS NEEDED  Current Outpatient Medications (Respiratory):    albuterol (VENTOLIN HFA) 108 (90 Base) MCG/ACT inhaler, TAKE 2 PUFFS BY MOUTH EVERY 6 HOURS AS NEEDED FOR WHEEZE OR SHORTNESS OF BREATH  Current Outpatient Medications (Analgesics):    acetaminophen (TYLENOL) 500 MG tablet, Take 1,000 mg by mouth every 6 (six) hours as needed for moderate pain. Taking 650 mg arthritis BID   allopurinol (ZYLOPRIM) 100 MG tablet, Take 2 tablets (200 mg total) by mouth daily.   aspirin EC 81 MG  tablet, Take 81 mg by mouth daily.   celecoxib (CELEBREX) 200 MG capsule, TAKE 1 CAPSULE BY MOUTH TWICE A DAY  Current Outpatient Medications (Hematological):    Cyanocobalamin (VITAMIN B-12 PO), Take 1,000 mcg by mouth.   Current Outpatient Medications (Other):    APPLE CIDER VINEGAR PO, Take 450 mg by mouth.   Ascorbic Acid (VITAMIN C) 500 MG CAPS, Take by mouth.   Blood Glucose Monitoring Suppl (FREESTYLE LITE) DEVI, Use to test sugars twice daily.Dx E11.9   buPROPion (WELLBUTRIN XL) 300 MG 24 hr tablet, Take 1 tablet (300 mg total) by mouth daily.  Cholecalciferol (VITAMIN D3 PO), Take 2,000 Units by mouth daily.    Coconut Oil 1000 MG CAPS, Take by mouth.   gabapentin (NEURONTIN) 300 MG capsule, TAKE 1 CAPSULE BY MOUTH THREE TIMES A DAY   Garlic 10 MG CAPS, Take by mouth.   glucosamine-chondroitin 500-400 MG tablet, Take 1 tablet by mouth 3 (three) times daily.   glucose blood (FREESTYLE LITE) test strip, Use to check blood glucose once daily (Dx: E 11.9 - type 2 DM without long term insulin use)   Lancets (FREESTYLE) lancets, Use to check blood glucose once daily (Dx: E 11.9 - type 2 DM without long term insulin use)   magnesium gluconate (MAGONATE) 500 MG tablet, Take 500 mg by mouth daily.    milk thistle 175 MG tablet, Take 175 mg by mouth daily.   Misc Natural Products (TART CHERRY ADVANCED PO), Take 1,200 mg by mouth daily.    Omega-3 Fatty Acids (SALMON OIL-1000 PO), Take by mouth.   Potassium 99 MG TABS, Take 99 mg by mouth.   traZODone (DESYREL) 100 MG tablet, TAKE 1 TABLET BY MOUTH EVERYDAY AT BEDTIME   Turmeric Curcumin 500 MG CAPS, Take by mouth.   zinc gluconate 50 MG tablet, Take 50 mg by mouth daily.   Reviewed prior external information including notes and imaging from  primary care provider As well as notes that were available from care everywhere and other healthcare systems.  Past medical history, social, surgical and family history all reviewed in electronic  medical record.  No pertanent information unless stated regarding to the chief complaint.   Review of Systems:  No headache, visual changes, nausea, vomiting, diarrhea, constipation, dizziness, abdominal pain, skin rash, fevers, chills, night sweats, weight loss, swollen lymph nodes, chest pain, shortness of breath, mood changes. POSITIVE muscle aches, joint swelling, body aches  Objective  Blood pressure 132/78, pulse 71, height 5\' 8"  (1.727 m), weight (!) 315 lb (142.9 kg), SpO2 97 %.   General: No apparent distress alert and oriented x3 mood and affect normal, dressed appropriately.  HEENT: Pupils equal, extraocular movements intact  Respiratory: Patient's speak in full sentences and does not appear short of breath  Pitting edema of lower extremities Patient does have crepitus of the knees bilaterally.  Patient does have limited flexion and extension noted.  Instability with valgus and varus force  After informed written and verbal consent, patient was seated on exam table. Right knee was prepped with alcohol swab and utilizing anterolateral approach, patient's right knee space was injected with 4:1  marcaine 0.5%: Kenalog 40mg /dL. Patient tolerated the procedure well without immediate complications.  After informed written and verbal consent, patient was seated on exam table. Left knee was prepped with alcohol swab and utilizing anterolateral approach, patient's left knee space was injected with 4:1  marcaine 0.5%: Kenalog 40mg /dL. Patient tolerated the procedure well without immediate complications.      Impression and Recommendations:      The above documentation has been reviewed and is accurate and complete Judi Saa, DO

## 2022-06-13 ENCOUNTER — Ambulatory Visit: Payer: Medicare Other | Admitting: Family Medicine

## 2022-06-14 ENCOUNTER — Ambulatory Visit (INDEPENDENT_AMBULATORY_CARE_PROVIDER_SITE_OTHER): Payer: Medicare Other | Admitting: Family Medicine

## 2022-06-14 VITALS — BP 132/78 | HR 71 | Ht 68.0 in | Wt 315.0 lb

## 2022-06-14 DIAGNOSIS — M170 Bilateral primary osteoarthritis of knee: Secondary | ICD-10-CM

## 2022-06-14 NOTE — Assessment & Plan Note (Signed)
Steroid injection given today, chronic problem with worsening symptoms.  BMI is still over 47 unable to be a surgical candidate.  Discussed with patient that I scheduled him exercises.  Follow-up with me in 2-3 months

## 2022-06-14 NOTE — Patient Instructions (Signed)
Scap exercise  2-3 month follow up

## 2022-06-15 ENCOUNTER — Encounter: Payer: Self-pay | Admitting: Podiatry

## 2022-06-15 ENCOUNTER — Ambulatory Visit (INDEPENDENT_AMBULATORY_CARE_PROVIDER_SITE_OTHER): Payer: Medicare Other | Admitting: Podiatry

## 2022-06-15 DIAGNOSIS — M79675 Pain in left toe(s): Secondary | ICD-10-CM

## 2022-06-15 DIAGNOSIS — E119 Type 2 diabetes mellitus without complications: Secondary | ICD-10-CM

## 2022-06-15 DIAGNOSIS — B351 Tinea unguium: Secondary | ICD-10-CM

## 2022-06-15 DIAGNOSIS — M79674 Pain in right toe(s): Secondary | ICD-10-CM

## 2022-06-15 NOTE — Progress Notes (Signed)
This patient returns to my office for at risk foot care.  This patient requires this care by a professional since this patient will be at risk due to having type 2 diabetes.  This patient is unable to cut nails himself since the patient cannot reach his nails.These nails are painful walking and wearing shoes.  This patient presents for at risk foot care today.  General Appearance  Alert, conversant and in no acute stress.  Vascular  Dorsalis pedis and posterior tibial  pulses are palpable  bilaterally.  Capillary return is within normal limits  bilaterally. Temperature is within normal limits  bilaterally.  Neurologic  Senn-Weinstein monofilament wire test within normal limits  bilaterally. Muscle power within normal limits bilaterally.  Nails Thick disfigured discolored nails with subungual debris  from hallux to fifth toes bilaterally. No evidence of bacterial infection or drainage bilaterally.  Orthopedic  No limitations of motion  feet .  No crepitus or effusions noted.  HAV  B/L.  Hammer toes  B/L.  Skin  normotropic skin with no porokeratosis noted bilaterally.  No signs of infections or ulcers noted.     Onychomycosis  Pain in right toes  Pain in left toes  Consent was obtained for treatment procedures.   Mechanical debridement of nails 1-5  bilaterally performed with a nail nipper.  Filed with dremel without incident.    Return office visit    3 months                  Told patient to return for periodic foot care and evaluation due to potential at risk complications.   Tanetta Fuhriman DPM   

## 2022-06-24 NOTE — Progress Notes (Unsigned)
Cardiology Office Note:    Date:  06/24/2022   ID:  Travis [name], DOB 1955/05/04, MRN [id number]  PCP:  Sheliah Hatch, MD  Cardiologist:  None  Electrophysiologist:  None   Referring MD: Sheliah Hatch, MD   Chief Complaint: routine follow-up of CAD  History of Present Illness:    Travis [name] is a 67 y.o. male with a history of CAD s/p remote POBA in 1997, PVCs noted on prior monitor, hypertension, hyperlipidemia, type 2 diabetes mellitus, Guillain Barre syndrome, and obstructive sleep apnea on CPAP who is followed by Dr. Flora Lipps and presents today for routine follow-up.  Patient has a history of CAD with remote POBA in 1997. He has had multiple stress test since then. Last Myoview in 2015 showed a primarily fixed defect in inferolateral and apical segments with a very small amount of peri-infarct ischemia which was similar to prior study. Last Echo in 08/2018 showed LVEF of 60-65% with grade 2 diastolic dysfunction, mildly enlarged RV with normal systolic function, and moderate pulmonary hypertension with TR peak gradient of 42 mmHg. Pulmonary hypertension was felt to likely be due to obesity but V/Q scan was ordered to rule out chronic PE and was negative. PFTs were also ordered and were normal.   She was last seen by Dr. Flora Lipps in 02/2021 at which time he was doing well from a cardiac standpoint.   Patient presents today for hospital follow-up. ***  CAD History of remote POBA in 1997. He has had several Myoviews since then. Last Myoview in 2015 showed a primarily fixed defect with a very small amount of peri-infarct ischemia similar to prior studies.  - No chest pain.  - Continue aspirin and statin.  History of PVCs PVCs have been noted on prior monitor.  - Stable. *** - Continue Toprol-XL 100mg  daily.   Hypertension BP well controlled. *** - Continue current medications: Amlodipine 10mg  daily, Lisinopril 40mg  daily, and Toprol-XL 100mg  daily. Also on Lasix *** as  needed for edema.   Hyperlipidemia Lipid panel in 05/2022: Total Cholesterol 125, Triglycerides 238, HDL 34. Direct LDL 69. LDL goal <70 given CAD.  - Continue Crestor 20mg  daily, Fenofibrate 160mg  daily, and Omega-3.  Type 2 Diabetes Mellitus Hemoglobin A1c 7.4% in 05/2022. - On Metformin and Farxiga.    Past Medical History:  Diagnosis Date   Appendicitis    Blood in stool    Coronary artery disease 1997   s/p PCI by Dr. Aleen Campi   Diverticulosis of colon with hemorrhage 2009   Dyslipidemia, goal LDL below 70    Edema    Encounter for long-term (current) use of other medications    Essential hypertension, benign    Family history of thyroid disease    Frequent unifocal PVCs    GBS (Guillain Barre syndrome) (HCC)    Morbid obesity (HCC)    Myalgia and myositis, unspecified    Prostate cancer (HCC) 2012   Pulmonary HTN (HCC) 08/14/2018   PASP by echo 2017   Sleep apnea with use of continuous positive airway pressure (CPAP)    Thrombocytopenia (HCC) 05/25/2014   Type II or unspecified type diabetes mellitus without mention of complication, not stated as uncontrolled     Past Surgical History:  Procedure Laterality Date   ANGIOPLASTY  1997   Dr. Aleen Campi   APPENDECTOMY     CATARACT EXTRACTION Left 05/18/2020   CATARACT EXTRACTION Right 06/08/2020   radioactive seed prostate  Current Medications: No outpatient medications have been marked as taking for the 07/05/22 encounter (Appointment) with Corrin Parker, PA-C.     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   Right-handed.   Caffeine: occasional use.   Social Determinants of Health   Financial Resource Strain: Low Risk  (05/23/2022)   Overall Financial Resource Strain (CARDIA)    Difficulty of Paying Living Expenses: Not hard at all  Food Insecurity: No Food Insecurity (05/23/2022)   Hunger Vital Sign    Worried About Running Out of Food in the Last Year: Never true    Ran Out of Food in the Last Year: Never true  Transportation Needs: No Transportation Needs (05/23/2022)   PRAPARE - Administrator, Civil Service (Medical): No    Lack of Transportation (Non-Medical): No  Physical Activity: Inactive (05/23/2022)   Exercise Vital Sign    Days of Exercise per Week: 0 days    Minutes of Exercise per Session: 0 min  Stress: No Stress Concern Present (05/23/2022)   [employer] of Occupational Health - Occupational Stress Questionnaire    Feeling of Stress : Not at all  Recent Concern: Stress - Stress Concern Present (05/21/2022)   [employer] of Occupational Health - Occupational Stress Questionnaire    Feeling of Stress : To some extent  Social Connections: Moderately Isolated (05/23/2022)   Social Connection and Isolation Panel [NHANES]    Frequency of Communication with Friends and Family: More than three times a week    Frequency of Social Gatherings with Friends and Family: Once a week    Attends Religious Services: Never    Database administrator or Organizations: No    Attends Engineer, structural: Never    Marital Status: Married     Family History: The patient's family history includes CVA in his father; Diabetes in his mother; Hypertension in his father and sister; Hypothyroidism in his sister.  ROS:   Please see the history of present  illness.     EKGs/Labs/Other Studies Reviewed:    The following studies were reviewed:  Echocardiogram 08/25/2018: Impressions: 1. The left ventricle has normal systolic function with an ejection  fraction of 60-65%. The cavity size was normal. Left ventricular diastolic  Doppler parameters are consistent with pseudonormalization. Elevated left  ventricular end-diastolic pressure  No evidence of left ventricular regional wall motion abnormalities.   2. The right ventricle has normal systolic function. The cavity was  mildly enlarged. There is no increase in right ventricular wall thickness.  Right ventricular systolic pressure Cannot assess as IVC is not  visualized. There is at least moderate  pulmonary HTN as the TR pk gradient is ..   3. Left atrial size was mildly dilated.  4. The aortic valve is tricuspid. Mild sclerosis of the aortic valve.   5. The aorta is normal in size and structure.   EKG:  EKG ordered today. EKG personally reviewed and demonstrates ***.  Recent Labs: 05/21/2022: ALT 32; BUN 18; Creatinine, Ser 0.83; Hemoglobin 14.5; Platelets 211.0; Potassium 4.7; Sodium 137; TSH 1.38  Recent Lipid Panel    Component Value Date/Time   CHOL 125 05/21/2022 0850   CHOL 131 02/08/2016 0841   TRIG 238.0 (H) 05/21/2022 0850   HDL 34.80 (L) 05/21/2022 0850   HDL 32 (L) 02/08/2016 0841   CHOLHDL 4 05/21/2022 0850   VLDL 47.6 (H) 05/21/2022 0850   LDLCALC 57 10/31/2021 0841   LDLCALC 71 02/08/2016 0841   LDLDIRECT 69.0 05/21/2022 0850    Physical Exam:    Vital Signs: There were no vitals taken for this visit.    Wt Readings from Last 3 Encounters:  06/14/22 (!) 315 lb (142.9 kg)  05/21/22 (!) 317 lb 4 oz (143.9 kg)  04/18/22 (!) 321 lb (145.6 kg)     General: 67 y.o. male in no acute distress. HEENT: Normocephalic and atraumatic. Sclera clear. EOMs intact. Neck: Supple. No carotid bruits. No JVD. Heart: *** RRR. Distinct S1 and S2. No murmurs, gallops, or  rubs. Radial and distal pedal pulses 2+ and equal bilaterally. Lungs: No increased work of breathing. Clear to ausculation bilaterally. No wheezes, rhonchi, or rales.  Abdomen: Soft, non-distended, and non-tender to palpation. Bowel sounds present in all 4 quadrants.  MSK: Normal strength and tone for age. *** Extremities: No lower extremity edema.    Skin: Warm and dry. Neuro: Alert and oriented x3. No focal deficits. Psych: Normal affect. Responds appropriately.   Assessment:    No diagnosis found.  Plan:     Disposition: Follow up in ***   Medication Adjustments/Labs and Tests Ordered: Current medicines are reviewed at length with the patient today.  Concerns regarding medicines are outlined above.  No orders of the defined types were placed in this encounter.  No orders of the defined types were placed in this encounter.   There are no Patient Instructions on file for this visit.   Signed, Corrin Parker, PA-C  06/24/2022 8:32 PM    [facility]

## 2022-06-27 ENCOUNTER — Encounter (INDEPENDENT_AMBULATORY_CARE_PROVIDER_SITE_OTHER): Payer: Medicare Other | Admitting: Family Medicine

## 2022-06-27 DIAGNOSIS — B3749 Other urogenital candidiasis: Secondary | ICD-10-CM

## 2022-06-27 MED ORDER — FLUCONAZOLE 150 MG PO TABS: 150.0000 mg | ORAL_TABLET | ORAL | 0 refills | Status: DC

## 2022-06-27 NOTE — Telephone Encounter (Signed)
[facility] VISIT   Patient agreed to [facility] visit and is aware that copayment and coinsurance may apply. Patient was treated using telemedicine according to accepted telemedicine protocols.  Subjective:   Patient complains of genital itching  Patient Active Problem List   Diagnosis Date Noted   Depression 01/18/2021   Osteoarthritis 09/15/2019   Neuropathy 06/24/2019   Abnormal MRI, cervical spine 06/02/2019   Bilateral arm weakness 06/02/2019   Gait abnormality 06/02/2019   Upper extremity weakness 04/18/2019   Hand arthritis 04/01/2019   Degenerative arthritis of knee, bilateral 09/02/2018   Pulmonary HTN (HCC) 08/14/2018   Insomnia 07/30/2016   Carpal tunnel syndrome, bilateral 07/30/2016   Lumbar radiculopathy 07/30/2016   Physical exam 06/13/2016   Obstructive sleep apnea 06/21/2014   Personal history of malignant neoplasm of prostate 06/21/2014   Hyperlipidemia associated with type 2 diabetes mellitus (HCC) 04/27/2014   Morbid obesity (HCC) 06/02/2013   Frequent unifocal PVCs    Coronary artery disease    Hypertension    Diabetes (HCC) 10/27/2012   Social History   Tobacco Use   Smoking status: Never   Smokeless tobacco: Never  Substance Use Topics   Alcohol use: No    Current Outpatient Medications:    acetaminophen (TYLENOL) 500 MG tablet, Take 1,000 mg by mouth every 6 (six) hours as needed for moderate pain. Taking 650 mg arthritis BID, Disp: , Rfl:    albuterol (VENTOLIN HFA) 108 (90 Base) MCG/ACT inhaler, TAKE 2 PUFFS BY MOUTH EVERY 6 HOURS AS NEEDED FOR WHEEZE OR SHORTNESS OF BREATH, Disp: 17 each, Rfl: 3   allopurinol (ZYLOPRIM) 100 MG tablet, Take 2 tablets (200 mg total) by mouth daily., Disp: 180 tablet, Rfl: 0   amLODipine (NORVASC) 10 MG tablet, TAKE 1 TABLET BY MOUTH EVERY DAY, Disp: 90 tablet, Rfl: 1   APPLE CIDER VINEGAR PO, Take 450 mg by mouth., Disp: , Rfl:    Ascorbic Acid (VITAMIN C) 500 MG CAPS, Take by mouth., Disp: , Rfl:    aspirin EC 81  MG tablet, Take 81 mg by mouth daily., Disp: , Rfl:    Blood Glucose Monitoring Suppl (FREESTYLE LITE) DEVI, Use to test sugars twice daily.Dx E11.9, Disp: 1 each, Rfl: 3   buPROPion (WELLBUTRIN XL) 300 MG 24 hr tablet, Take 1 tablet (300 mg total) by mouth daily., Disp: 90 tablet, Rfl: 1   celecoxib (CELEBREX) 200 MG capsule, TAKE 1 CAPSULE BY MOUTH TWICE A DAY, Disp: 60 capsule, Rfl: 3   Cholecalciferol (VITAMIN D3 PO), Take 2,000 Units by mouth daily. , Disp: , Rfl:    Coconut Oil 1000 MG CAPS, Take by mouth., Disp: , Rfl:    Cyanocobalamin (VITAMIN B-12 PO), Take 1,000 mcg by mouth. , Disp: , Rfl:    dapagliflozin propanediol (FARXIGA) 5 MG TABS tablet, Take 1 tablet (5 mg total) by mouth daily before breakfast., Disp: 90 tablet, Rfl: 2   fenofibrate 160 MG tablet, TAKE 1 TABLET BY MOUTH EVERY DAY, Disp: 90 tablet, Rfl: 1   furosemide (LASIX) 20 MG tablet, TAKE 1 TO 2 TABLETS BY MOUTH TWICE A DAY AS NEEDED FOR EDEMA, Disp: 360 tablet, Rfl: 1   gabapentin (NEURONTIN) 300 MG capsule, TAKE 1 CAPSULE BY MOUTH THREE TIMES A DAY, Disp: 270 capsule, Rfl: 1   Garlic 10 MG CAPS, Take by mouth., Disp: , Rfl:    glucosamine-chondroitin 500-400 MG tablet, Take 1 tablet by mouth 3 (three) times daily., Disp: , Rfl:    glucose blood (FREESTYLE  LITE) test strip, Use to check blood glucose once daily (Dx: E 11.9 - type 2 DM without long term insulin use), Disp: 100 strip, Rfl: 3   Lancets (FREESTYLE) lancets, Use to check blood glucose once daily (Dx: E 11.9 - type 2 DM without long term insulin use), Disp: 100 each, Rfl: 12   lisinopril (ZESTRIL) 40 MG tablet, TAKE 1 TABLET BY MOUTH EVERY DAY, Disp: 90 tablet, Rfl: 1   magnesium gluconate (MAGONATE) 500 MG tablet, Take 500 mg by mouth daily. , Disp: , Rfl:    metFORMIN (GLUCOPHAGE) 500 MG tablet, TAKE 2 TABLETS BY MOUTH 2 TIMES DAILY WITH A MEAL., Disp: 360 tablet, Rfl: 1   metoprolol succinate (TOPROL-XL) 100 MG 24 hr tablet, Take 1 tablet (100 mg total) by  mouth daily. KEEP OV., Disp: 90 tablet, Rfl: 0   milk thistle 175 MG tablet, Take 175 mg by mouth daily., Disp: , Rfl:    Misc Natural Products (TART CHERRY ADVANCED PO), Take 1,200 mg by mouth daily. , Disp: , Rfl:    Omega-3 Fatty Acids (SALMON OIL-1000 PO), Take by mouth., Disp: , Rfl:    Potassium 99 MG TABS, Take 99 mg by mouth., Disp: , Rfl:    rosuvastatin (CRESTOR) 20 MG tablet, TAKE 1 TABLET BY MOUTH EVERY DAY, Disp: 90 tablet, Rfl: 1   sildenafil (VIAGRA) 100 MG tablet, TAKE 1/2 TO 1 TABLET DAILY AS NEEDED, Disp: 4 tablet, Rfl: 9   traZODone (DESYREL) 100 MG tablet, TAKE 1 TABLET BY MOUTH EVERYDAY AT BEDTIME, Disp: 90 tablet, Rfl: 1   Turmeric Curcumin 500 MG CAPS, Take by mouth., Disp: , Rfl:    zinc gluconate 50 MG tablet, Take 50 mg by mouth daily., Disp: , Rfl:   Allergies  Allergen Reactions   Crestor [Rosuvastatin]     Muscle Pain    Lipitor [Atorvastatin] Other (See Comments)    Arm weakness     Assessment and Plan:   Diagnosis: candida infxn of genital region. Please see myChart communication and orders below.   No orders of the defined types were placed in this encounter.  No orders of the defined types were placed in this encounter.   Neena Rhymes, MD 06/27/2022  A total of 7 minutes were spent by me to personally review the patient-generated inquiry, review patient records and data pertinent to assessment of the patient's problem, develop a management plan including generation of prescriptions and/or orders, and on subsequent communication with the patient through secure the MyChart portal service.   There is no separately reported E/M service related to this service in the past 7 days nor does the patient have an upcoming soonest available appointment for this issue. This work was completed in less than 7 days.   The patient consented to this service today (see patient agreement prior to ongoing communication). Patient counseled regarding the need for  in-person exam for certain conditions and was advised to call the office if any changing or worsening symptoms occur.   The codes to be used for the E/M service are: [x]   99421 for 5-10 minutes of time spent on the inquiry. []   [id number] for 11-20 minutes. []   [id number] for 21+ minutes.

## 2022-07-02 ENCOUNTER — Emergency Department (HOSPITAL_BASED_OUTPATIENT_CLINIC_OR_DEPARTMENT_OTHER)
Admission: EM | Admit: 2022-07-02 | Discharge: 2022-07-02 | Disposition: A | Discharge status: Home / Self Care | Payer: Medicare Other | Attending: Emergency Medicine | Admitting: Emergency Medicine

## 2022-07-02 ENCOUNTER — Other Ambulatory Visit: Payer: Self-pay

## 2022-07-02 DIAGNOSIS — S61212A Laceration without foreign body of right middle finger without damage to nail, initial encounter: Secondary | ICD-10-CM | POA: Insufficient documentation

## 2022-07-02 DIAGNOSIS — Z794 Long term (current) use of insulin: Secondary | ICD-10-CM | POA: Insufficient documentation

## 2022-07-02 DIAGNOSIS — Z7982 Long term (current) use of aspirin: Secondary | ICD-10-CM | POA: Insufficient documentation

## 2022-07-02 DIAGNOSIS — W268XXA Contact with other sharp object(s), not elsewhere classified, initial encounter: Secondary | ICD-10-CM | POA: Diagnosis not present

## 2022-07-02 NOTE — ED Notes (Signed)
RN reviewed discharge instructions with pt. Pt verbalized understanding and had no further questions. VSS upon discharge.  

## 2022-07-02 NOTE — ED Triage Notes (Signed)
Cut tip of right middle finger with mandolin slicer. Bleeding controlled. Cleaned with wound cleanser and wrapped in clean gauze. Unsure of tetanus status.

## 2022-07-02 NOTE — ED Provider Notes (Signed)
[place] EMERGENCY DEPARTMENT AT [facility] Provider Note   CSN: [id number] Arrival date & time: 07/02/22  1600     History  Chief Complaint  Patient presents with   Finger Injury    Travis [name] is a 67 y.o. male  with wife at beside presents to the ED with a superficial laceration to right 3rd finger pad. Patient states he accidentally cut himself with a mandolin about 2 hours ago. Patient states he has some pain at the injury site and denies finger/hand weakness or loss of sensation. Patient believes his tetanus shot was given within the past 10 years. Patient denies any previous injuries or surgery to his left hand/finger.   HPI     Home Medications Prior to Admission medications   Medication Sig Start Date End Date Taking? Authorizing Provider  acetaminophen (TYLENOL) 500 MG tablet Take 1,000 mg by mouth every 6 (six) hours as needed for moderate pain. Taking 650 mg arthritis BID    [provider]  albuterol (VENTOLIN HFA) 108 (90 Base) MCG/ACT inhaler TAKE 2 PUFFS BY MOUTH EVERY 6 HOURS AS NEEDED FOR WHEEZE OR SHORTNESS OF BREATH 04/11/22   Sheliah Hatch, MD  allopurinol (ZYLOPRIM) 100 MG tablet Take 2 tablets (200 mg total) by mouth daily. 05/23/22   Judi Saa, DO  amLODipine (NORVASC) 10 MG tablet TAKE 1 TABLET BY MOUTH EVERY DAY 01/01/22   Sheliah Hatch, MD  APPLE CIDER VINEGAR PO Take 450 mg by mouth.    [provider]  Ascorbic Acid (VITAMIN C) 500 MG CAPS Take by mouth.    [provider]  aspirin EC 81 MG tablet Take 81 mg by mouth daily.    [provider]  Blood Glucose Monitoring Suppl (FREESTYLE LITE) DEVI Use to test sugars twice daily.Dx E11.9 01/20/20   Sheliah Hatch, MD  buPROPion (WELLBUTRIN XL) 300 MG 24 hr tablet Take 1 tablet (300 mg total) by mouth daily. 05/21/22   Sheliah Hatch, MD  celecoxib (CELEBREX) 200 MG capsule TAKE 1 CAPSULE BY MOUTH TWICE A DAY 05/24/22   Sheliah Hatch,  MD  Cholecalciferol (VITAMIN D3 PO) Take 2,000 Units by mouth daily.     [provider]  Coconut Oil 1000 MG CAPS Take by mouth.    [provider]  Cyanocobalamin (VITAMIN B-12 PO) Take 1,000 mcg by mouth.     [provider]  dapagliflozin propanediol (FARXIGA) 5 MG TABS tablet Take 1 tablet (5 mg total) by mouth daily before breakfast. 05/23/22   Sheliah Hatch, MD  fenofibrate 160 MG tablet TAKE 1 TABLET BY MOUTH EVERY DAY 03/29/22   Sheliah Hatch, MD  fluconazole (DIFLUCAN) 150 MG tablet Take 1 tablet (150 mg total) by mouth once a week. 06/27/22   Sheliah Hatch, MD  furosemide (LASIX) 20 MG tablet TAKE 1 TO 2 TABLETS BY MOUTH TWICE A DAY AS NEEDED FOR EDEMA 04/18/22   Sheliah Hatch, MD  gabapentin (NEURONTIN) 300 MG capsule TAKE 1 CAPSULE BY MOUTH THREE TIMES A DAY 04/18/22   Sheliah Hatch, MD  Garlic 10 MG CAPS Take by mouth.    [provider]  glucosamine-chondroitin 500-400 MG tablet Take 1 tablet by mouth 3 (three) times daily.    [provider]  glucose blood (FREESTYLE LITE) test strip Use to check blood glucose once daily (Dx: E 11.9 - type 2 DM without long term insulin use) 03/28/22   Tabori,  Helane Rima, MD  Lancets (FREESTYLE) lancets Use to check blood glucose once daily (Dx: E 11.9 - type 2 DM without long term insulin use) 03/28/22   Sheliah Hatch, MD  lisinopril (ZESTRIL) 40 MG tablet TAKE 1 TABLET BY MOUTH EVERY DAY 04/18/22   Sheliah Hatch, MD  magnesium gluconate (MAGONATE) 500 MG tablet Take 500 mg by mouth daily.     [provider]  metFORMIN (GLUCOPHAGE) 500 MG tablet TAKE 2 TABLETS BY MOUTH 2 TIMES DAILY WITH A MEAL. 03/29/22   Sheliah Hatch, MD  metoprolol succinate (TOPROL-XL) 100 MG 24 hr tablet Take 1 tablet (100 mg total) by mouth daily. KEEP OV. 05/21/22   Ronney Asters, NP  milk thistle 175 MG tablet Take 175 mg by mouth daily.    [provider]  Misc Natural  Products (TART CHERRY ADVANCED PO) Take 1,200 mg by mouth daily.     [provider]  Omega-3 Fatty Acids (SALMON OIL-1000 PO) Take by mouth.    [provider]  Potassium 99 MG TABS Take 99 mg by mouth.    [provider]  rosuvastatin (CRESTOR) 20 MG tablet TAKE 1 TABLET BY MOUTH EVERY DAY 01/01/22   Sheliah Hatch, MD  sildenafil (VIAGRA) 100 MG tablet TAKE 1/2 TO 1 TABLET DAILY AS NEEDED 11/03/21   Sheliah Hatch, MD  traZODone (DESYREL) 100 MG tablet TAKE 1 TABLET BY MOUTH EVERYDAY AT BEDTIME 11/03/21   Sheliah Hatch, MD  Turmeric Curcumin 500 MG CAPS Take by mouth.    [provider]  zinc gluconate 50 MG tablet Take 50 mg by mouth daily.    [provider]      Allergies    Crestor [rosuvastatin] and Lipitor [atorvastatin]    Review of Systems   Review of Systems  Skin:  Positive for wound.  All other systems reviewed and are negative.   Physical Exam Updated Vital Signs BP (!) 149/75 (BP Location: Left Arm)   Pulse 79   Temp 98.5 F (36.9 C)   Resp 16   SpO2 94%  Physical Exam Vitals and nursing note reviewed.  Constitutional:      General: He is not in acute distress.    Appearance: Normal appearance.  HENT:     Head: Normocephalic and atraumatic.  Eyes:     General:        Right eye: No discharge.        Left eye: No discharge.  Cardiovascular:     Rate and Rhythm: Normal rate and regular rhythm.  Pulmonary:     Effort: Pulmonary effort is normal. No respiratory distress.  Musculoskeletal:        General: No deformity.  Skin:    General: Skin is warm and dry.     Capillary Refill: Capillary refill takes less than 2 seconds.     Comments: Patient with a very small laceration/distal amputation of the very top millimeter of the right middle finger.  No active bleeding at this time.  No damage to nail bed.  No evidence of foreign body.   Neurological:     Mental Status: He is alert and oriented to  person, place, and time.  Psychiatric:        Mood and Affect: Mood normal.        Behavior: Behavior normal.     ED Results / Procedures / Treatments   Labs (all labs ordered are listed, but only abnormal  results are displayed) Labs Reviewed - No data to display  EKG None  Radiology No results found.  Procedures Procedures    Medications Ordered in ED Medications - No data to display  ED Course/ Medical Decision Making/ A&P                             Medical Decision Making  This patient is a 67 y.o. male who presents to the ED for concern of laceration.   Differential diagnoses prior to evaluation: Laceration requiring repair, nailbed injury, foreign body, vs other  Past Medical History / Social History / Additional history: Chart reviewed. Pertinent results include: Overall noncontributory, tetanus shot up-to-date  Physical Exam: Physical exam performed. The pertinent findings include: Patient with a very small laceration/distal amputation of the very top millimeter of the right middle finger.  No active bleeding at this time.  No damage to nail bed.  No evidence of foreign body.    Medications / Treatment: Encouraged keeping the wound clean, dry, bandaged, monitoring for any signs of infection, no repair is needed at this time.   Disposition: After consideration of the diagnostic results and the patients response to treatment, I feel that patient is stable for discharge with plan as above .   emergency department workup does not suggest an emergent condition requiring admission or immediate intervention beyond what has been performed at this time. The plan is: Wound care, monitoring for infection, no repair Needed at this time. The patient is safe for discharge and has been instructed to return immediately for worsening symptoms, change in symptoms or any other concerns.  Final Clinical Impression(s) / ED Diagnoses Final diagnoses:  Laceration of right middle  finger without foreign body without damage to nail, initial encounter    Rx / DC Orders ED Discharge Orders     None         Olene Floss, PA-C 07/02/22 1752    Vanetta Mulders, MD 07/05/22 1401

## 2022-07-05 ENCOUNTER — Encounter: Payer: Self-pay | Admitting: Student

## 2022-07-05 ENCOUNTER — Ambulatory Visit: Payer: Medicare Other | Attending: Student | Admitting: Student

## 2022-07-05 VITALS — BP 118/54 | HR 72 | Ht 69.0 in | Wt 310.4 lb

## 2022-07-05 DIAGNOSIS — I10 Essential (primary) hypertension: Secondary | ICD-10-CM | POA: Diagnosis present

## 2022-07-05 DIAGNOSIS — E118 Type 2 diabetes mellitus with unspecified complications: Secondary | ICD-10-CM

## 2022-07-05 DIAGNOSIS — I493 Ventricular premature depolarization: Secondary | ICD-10-CM

## 2022-07-05 DIAGNOSIS — E785 Hyperlipidemia, unspecified: Secondary | ICD-10-CM

## 2022-07-05 DIAGNOSIS — Z7984 Long term (current) use of oral hypoglycemic drugs: Secondary | ICD-10-CM

## 2022-07-05 DIAGNOSIS — I2510 Atherosclerotic heart disease of native coronary artery without angina pectoris: Secondary | ICD-10-CM

## 2022-07-05 NOTE — Patient Instructions (Addendum)
Medication Instructions:   No changes  *If you need a refill on your cardiac medications before your next appointment, please call your pharmacy*   Lab Work: No needed     Testing/Procedures:  Not needed  Follow-Up: At [facility], you and your health needs are our priority.  As part of our continuing mission to provide you with exceptional heart care, we have created designated Provider Care Teams.  These Care Teams include your primary Cardiologist (physician) and Advanced Practice Providers (APPs -  Physician Assistants and Nurse Practitioners) who all work together to provide you with the care you need, when you need it.     Your next appointment:   12 month(s)  The format for your next appointment:   In Person  Provider:   Reatha Harps, MD

## 2022-07-09 ENCOUNTER — Telehealth: Payer: Self-pay

## 2022-07-09 NOTE — Telephone Encounter (Signed)
Transition Care Management Unsuccessful Follow-up Telephone Call  Date of discharge and from where:  [facility] 6/17  Attempts:  1st Attempt  Reason for unsuccessful TCM follow-up call:  Left voice message   Cannon Arreola [facility] Guide, [place] [phone] [address], [place], [place] [address] Phone: [phone] Email: [email or web address]       

## 2022-07-10 ENCOUNTER — Telehealth: Payer: Self-pay

## 2022-07-10 NOTE — Telephone Encounter (Signed)
Transition Care Management Unsuccessful Follow-up Telephone Call  Date of discharge and from where:  [facility] 6/17  Attempts:  2nd Attempt  Reason for unsuccessful TCM follow-up call:  Unable to leave message   Lenard Forth [facility] Guide, [facility] [phone] [address] [place], [place], [place] [address] Phone: [phone] Email: [email or web address]

## 2022-07-13 ENCOUNTER — Other Ambulatory Visit: Payer: Self-pay | Admitting: Family Medicine

## 2022-08-01 ENCOUNTER — Other Ambulatory Visit: Payer: Self-pay | Admitting: Family Medicine

## 2022-08-09 NOTE — Progress Notes (Deleted)
[name] [address] [place] [address] Phone: [phone] Subjective:    I'm seeing this patient by the request  of:  Sheliah Hatch, MD  CC:   [id number]  06/14/2022 Steroid injection given today, chronic problem with worsening symptoms.  BMI is still over 47 unable to be a surgical candidate.  Discussed with patient that I scheduled him exercises.  Follow-up with me in 2-3 months       Update 08/13/2022 Travis [name] is a 67 y.o. male coming in with complaint of B knee pain. Patient states        Past Medical History:  Diagnosis Date   Appendicitis    Blood in stool    Coronary artery disease 1997   s/p PCI by Dr. Aleen Campi   Diverticulosis of colon with hemorrhage 2009   Dyslipidemia, goal LDL below 70    Edema    Encounter for long-term (current) use of other medications    Essential hypertension, benign    Family history of thyroid disease    Frequent unifocal PVCs    GBS (Guillain Barre syndrome) (HCC)    Morbid obesity (HCC)    Myalgia and myositis, unspecified    Prostate cancer (HCC) 2012   Pulmonary HTN (HCC) 08/14/2018   PASP by echo 2017   Sleep apnea with use of continuous positive airway pressure (CPAP)    Thrombocytopenia (HCC) 05/25/2014   Type II or unspecified type diabetes mellitus without mention of complication, not stated as uncontrolled    Past Surgical History:  Procedure Laterality Date   ANGIOPLASTY  1997   Dr. Aleen Campi   APPENDECTOMY     CATARACT EXTRACTION Left 05/18/2020   CATARACT EXTRACTION Right 06/08/2020   radioactive seed prostate     Social History   Socioeconomic History   Marital status: Married    Spouse name: Not on file   Number of children: 0   Years of education: 12   Highest education level: 12th grade  Occupational History   Occupation: Building surveyor: [employer]    Comment: mod/heavy lifting  Tobacco Use   Smoking status: Never    Smokeless tobacco: Never  Vaping Use   Vaping status: Never Used  Substance and Sexual Activity   Alcohol use: No   Drug use: No   Sexual activity: Not on file  Other Topics Concern   Not on file  Social History Narrative   Lives at home with his wife.   Right-handed.   Caffeine: occasional use.   Social Determinants of Health   Financial Resource Strain: Low Risk  (05/23/2022)   Overall Financial Resource Strain (CARDIA)    Difficulty of Paying Living Expenses: Not hard at all  Food Insecurity: No Food Insecurity (05/23/2022)   Hunger Vital Sign    Worried About Running Out of Food in the Last Year: Never true    Ran Out of Food in the Last Year: Never true  Transportation Needs: No Transportation Needs (05/23/2022)   PRAPARE - Administrator, Civil Service (Medical): No    Lack of Transportation (Non-Medical): No  Physical Activity: Inactive (05/23/2022)   Exercise Vital Sign    Days of Exercise per Week: 0 days    Minutes of Exercise per Session: 0 min  Stress: No Stress Concern Present (05/23/2022)   [employer] of Occupational Health - Occupational Stress Questionnaire    Feeling of Stress : Not  at all  Recent Concern: Stress - Stress Concern Present (05/21/2022)   [employer] of Occupational Health - Occupational Stress Questionnaire    Feeling of Stress : To some extent  Social Connections: Moderately Isolated (05/23/2022)   Social Connection and Isolation Panel [NHANES]    Frequency of Communication with Friends and Family: More than three times a week    Frequency of Social Gatherings with Friends and Family: Once a week    Attends Religious Services: Never    Database administrator or Organizations: No    Attends Engineer, structural: Never    Marital Status: Married   Allergies  Allergen Reactions   Crestor [Rosuvastatin]     Muscle Pain    Lipitor [Atorvastatin] Other (See Comments)    Arm weakness    Family History  Problem  Relation Age of Onset   CVA Father    Hypertension Father    Diabetes Mother    Hypothyroidism Sister    Hypertension Sister     Current Outpatient Medications (Endocrine & Metabolic):    metFORMIN (GLUCOPHAGE) 500 MG tablet, TAKE 2 TABLETS BY MOUTH 2 TIMES DAILY WITH A MEAL.  Current Outpatient Medications (Cardiovascular):    amLODipine (NORVASC) 10 MG tablet, TAKE 1 TABLET BY MOUTH EVERY DAY   fenofibrate 160 MG tablet, TAKE 1 TABLET BY MOUTH EVERY DAY   furosemide (LASIX) 20 MG tablet, TAKE 1 TO 2 TABLETS BY MOUTH TWICE A DAY AS NEEDED FOR EDEMA   lisinopril (ZESTRIL) 40 MG tablet, TAKE 1 TABLET BY MOUTH EVERY DAY   metoprolol succinate (TOPROL-XL) 100 MG 24 hr tablet, Take 1 tablet (100 mg total) by mouth daily. KEEP OV.   rosuvastatin (CRESTOR) 20 MG tablet, TAKE 1 TABLET BY MOUTH EVERY DAY   sildenafil (VIAGRA) 100 MG tablet, TAKE 1/2 TO 1 TABLET DAILY AS NEEDED  Current Outpatient Medications (Respiratory):    albuterol (VENTOLIN HFA) 108 (90 Base) MCG/ACT inhaler, TAKE 2 PUFFS BY MOUTH EVERY 6 HOURS AS NEEDED FOR WHEEZE OR SHORTNESS OF BREATH  Current Outpatient Medications (Analgesics):    acetaminophen (TYLENOL) 500 MG tablet, Take 1,000 mg by mouth every 6 (six) hours as needed for moderate pain. Taking 650 mg arthritis BID   allopurinol (ZYLOPRIM) 100 MG tablet, Take 2 tablets (200 mg total) by mouth daily.   aspirin EC 81 MG tablet, Take 81 mg by mouth daily.   celecoxib (CELEBREX) 200 MG capsule, TAKE 1 CAPSULE BY MOUTH TWICE A DAY  Current Outpatient Medications (Hematological):    Cyanocobalamin (VITAMIN B-12 PO), Take 1,000 mcg by mouth.   Current Outpatient Medications (Other):    APPLE CIDER VINEGAR PO, Take 450 mg by mouth.   Ascorbic Acid (VITAMIN C) 500 MG CAPS, Take by mouth.   Blood Glucose Monitoring Suppl (FREESTYLE LITE) DEVI, Use to test sugars twice daily.Dx E11.9   buPROPion (WELLBUTRIN XL) 300 MG 24 hr tablet, Take 1 tablet (300 mg total) by mouth  daily.   Cholecalciferol (VITAMIN D3 PO), Take 2,000 Units by mouth daily.    Coconut Oil 1000 MG CAPS, Take by mouth.   fluconazole (DIFLUCAN) 150 MG tablet, Take 1 tablet (150 mg total) by mouth once a week.   gabapentin (NEURONTIN) 300 MG capsule, TAKE 1 CAPSULE BY MOUTH THREE TIMES A DAY   Garlic 10 MG CAPS, Take by mouth.   glucosamine-chondroitin 500-400 MG tablet, Take 1 tablet by mouth 3 (three) times daily.   glucose blood (FREESTYLE LITE) test strip,  Use to check blood glucose once daily (Dx: E 11.9 - type 2 DM without long term insulin use)   Lancets (FREESTYLE) lancets, Use to check blood glucose once daily (Dx: E 11.9 - type 2 DM without long term insulin use)   magnesium gluconate (MAGONATE) 500 MG tablet, Take 500 mg by mouth daily.    milk thistle 175 MG tablet, Take 175 mg by mouth daily.   Misc Natural Products (TART CHERRY ADVANCED PO), Take 1,200 mg by mouth daily.    Omega-3 Fatty Acids (SALMON OIL-1000 PO), Take by mouth.   Potassium 99 MG TABS, Take 99 mg by mouth.   traZODone (DESYREL) 100 MG tablet, TAKE 1 TABLET BY MOUTH EVERYDAY AT BEDTIME   Turmeric Curcumin 500 MG CAPS, Take by mouth.   zinc gluconate 50 MG tablet, Take 50 mg by mouth daily.   Reviewed prior external information including notes and imaging from  primary care provider As well as notes that were available from care everywhere and other healthcare systems.  Past medical history, social, surgical and family history all reviewed in electronic medical record.  No pertanent information unless stated regarding to the chief complaint.   Review of Systems:  No headache, visual changes, nausea, vomiting, diarrhea, constipation, dizziness, abdominal pain, skin rash, fevers, chills, night sweats, weight loss, swollen lymph nodes, body aches, joint swelling, chest pain, shortness of breath, mood changes. POSITIVE muscle aches  Objective  There were no vitals taken for this visit.   General: No apparent  distress alert and oriented x3 mood and affect normal, dressed appropriately.  HEENT: Pupils equal, extraocular movements intact  Respiratory: Patient's speak in full sentences and does not appear short of breath  Cardiovascular: No lower extremity edema, non tender, no erythema      Impression and Recommendations:

## 2022-08-13 ENCOUNTER — Ambulatory Visit: Payer: Medicare Other | Admitting: Family Medicine

## 2022-08-13 ENCOUNTER — Other Ambulatory Visit: Payer: Self-pay | Admitting: General Practice

## 2022-08-13 NOTE — Progress Notes (Unsigned)
[name] [address] [place] [address] Phone: [phone] Subjective:   Travis [name], am serving as a scribe for Dr. Antoine Primas.  I'm seeing this patient by the request  of:  Sheliah Hatch, MD  CC: Bilateral knee pain follow-up  [id number]  06/14/2022 Steroid injection given today, chronic problem with worsening symptoms.  BMI is still over 47 unable to be a surgical candidate.  Discussed with patient that I scheduled him exercises.  Follow-up with me in 2-3 months       Update 08/14/2022 Travis [name] is a 67 y.o. male coming in with complaint of B knee pain. Monovisc approved. Patient states that he is here for gel today. Pain has been worse than usual. The day after his last appointment he was walking on uneven ground at his old property which aggravated his knee.        Past Medical History:  Diagnosis Date   Appendicitis    Blood in stool    Coronary artery disease 1997   s/p PCI by Dr. Aleen Campi   Diverticulosis of colon with hemorrhage 2009   Dyslipidemia, goal LDL below 70    Edema    Encounter for long-term (current) use of other medications    Essential hypertension, benign    Family history of thyroid disease    Frequent unifocal PVCs    GBS (Guillain Barre syndrome) (HCC)    Morbid obesity (HCC)    Myalgia and myositis, unspecified    Prostate cancer (HCC) 2012   Pulmonary HTN (HCC) 08/14/2018   PASP by echo 2017   Sleep apnea with use of continuous positive airway pressure (CPAP)    Thrombocytopenia (HCC) 05/25/2014   Type II or unspecified type diabetes mellitus without mention of complication, not stated as uncontrolled    Past Surgical History:  Procedure Laterality Date   ANGIOPLASTY  1997   Dr. Aleen Campi   APPENDECTOMY     CATARACT EXTRACTION Left 05/18/2020   CATARACT EXTRACTION Right 06/08/2020   radioactive seed prostate     Social History   Socioeconomic History   Marital  status: Married    Spouse name: Not on file   Number of children: 0   Years of education: 12   Highest education level: 12th grade  Occupational History   Occupation: Building surveyor: [employer]    Comment: mod/heavy lifting  Tobacco Use   Smoking status: Never   Smokeless tobacco: Never  Vaping Use   Vaping status: Never Used  Substance and Sexual Activity   Alcohol use: No   Drug use: No   Sexual activity: Not on file  Other Topics Concern   Not on file  Social History Narrative   Lives at home with his wife.   Right-handed.   Caffeine: occasional use.   Social Determinants of Health   Financial Resource Strain: Low Risk  (05/23/2022)   Overall Financial Resource Strain (CARDIA)    Difficulty of Paying Living Expenses: Not hard at all  Food Insecurity: No Food Insecurity (05/23/2022)   Hunger Vital Sign    Worried About Running Out of Food in the Last Year: Never true    Ran Out of Food in the Last Year: Never true  Transportation Needs: No Transportation Needs (05/23/2022)   PRAPARE - Administrator, Civil Service (Medical): No    Lack of Transportation (Non-Medical): No  Physical Activity: Inactive (05/23/2022)  Exercise Vital Sign    Days of Exercise per Week: 0 days    Minutes of Exercise per Session: 0 min  Stress: No Stress Concern Present (05/23/2022)   [employer] of Occupational Health - Occupational Stress Questionnaire    Feeling of Stress : Not at all  Recent Concern: Stress - Stress Concern Present (05/21/2022)   [employer] of Occupational Health - Occupational Stress Questionnaire    Feeling of Stress : To some extent  Social Connections: Moderately Isolated (05/23/2022)   Social Connection and Isolation Panel [NHANES]    Frequency of Communication with Friends and Family: More than three times a week    Frequency of Social Gatherings with Friends and Family: Once a week    Attends Religious Services: Never    Automotive engineer or Organizations: No    Attends Engineer, structural: Never    Marital Status: Married   Allergies  Allergen Reactions   Crestor [Rosuvastatin]     Muscle Pain    Lipitor [Atorvastatin] Other (See Comments)    Arm weakness    Family History  Problem Relation Age of Onset   CVA Father    Hypertension Father    Diabetes Mother    Hypothyroidism Sister    Hypertension Sister     Current Outpatient Medications (Endocrine & Metabolic):    metFORMIN (GLUCOPHAGE) 500 MG tablet, TAKE 2 TABLETS BY MOUTH 2 TIMES DAILY WITH A MEAL.  Current Outpatient Medications (Cardiovascular):    amLODipine (NORVASC) 10 MG tablet, TAKE 1 TABLET BY MOUTH EVERY DAY   fenofibrate 160 MG tablet, TAKE 1 TABLET BY MOUTH EVERY DAY   furosemide (LASIX) 20 MG tablet, TAKE 1 TO 2 TABLETS BY MOUTH TWICE A DAY AS NEEDED FOR EDEMA   lisinopril (ZESTRIL) 40 MG tablet, TAKE 1 TABLET BY MOUTH EVERY DAY   metoprolol succinate (TOPROL-XL) 100 MG 24 hr tablet, TAKE 1 TABLET (100 MG TOTAL) BY MOUTH DAILY. KEEP OV.   rosuvastatin (CRESTOR) 20 MG tablet, TAKE 1 TABLET BY MOUTH EVERY DAY   sildenafil (VIAGRA) 100 MG tablet, TAKE 1/2 TO 1 TABLET DAILY AS NEEDED  Current Outpatient Medications (Respiratory):    albuterol (VENTOLIN HFA) 108 (90 Base) MCG/ACT inhaler, TAKE 2 PUFFS BY MOUTH EVERY 6 HOURS AS NEEDED FOR WHEEZE OR SHORTNESS OF BREATH  Current Outpatient Medications (Analgesics):    acetaminophen (TYLENOL) 500 MG tablet, Take 1,000 mg by mouth every 6 (six) hours as needed for moderate pain. Taking 650 mg arthritis BID   allopurinol (ZYLOPRIM) 100 MG tablet, Take 2 tablets (200 mg total) by mouth daily.   aspirin EC 81 MG tablet, Take 81 mg by mouth daily.   celecoxib (CELEBREX) 200 MG capsule, TAKE 1 CAPSULE BY MOUTH TWICE A DAY  Current Outpatient Medications (Hematological):    Cyanocobalamin (VITAMIN B-12 PO), Take 1,000 mcg by mouth.   Current Outpatient Medications (Other):     APPLE CIDER VINEGAR PO, Take 450 mg by mouth.   Ascorbic Acid (VITAMIN C) 500 MG CAPS, Take by mouth.   Blood Glucose Monitoring Suppl (FREESTYLE LITE) DEVI, Use to test sugars twice daily.Dx E11.9   buPROPion (WELLBUTRIN XL) 300 MG 24 hr tablet, Take 1 tablet (300 mg total) by mouth daily.   Cholecalciferol (VITAMIN D3 PO), Take 2,000 Units by mouth daily.    Coconut Oil 1000 MG CAPS, Take by mouth.   fluconazole (DIFLUCAN) 150 MG tablet, Take 1 tablet (150 mg total) by mouth once a  week.   gabapentin (NEURONTIN) 300 MG capsule, TAKE 1 CAPSULE BY MOUTH THREE TIMES A DAY   Garlic 10 MG CAPS, Take by mouth.   glucosamine-chondroitin 500-400 MG tablet, Take 1 tablet by mouth 3 (three) times daily.   glucose blood (FREESTYLE LITE) test strip, Use to check blood glucose once daily (Dx: E 11.9 - type 2 DM without long term insulin use)   Lancets (FREESTYLE) lancets, Use to check blood glucose once daily (Dx: E 11.9 - type 2 DM without long term insulin use)   magnesium gluconate (MAGONATE) 500 MG tablet, Take 500 mg by mouth daily.    milk thistle 175 MG tablet, Take 175 mg by mouth daily.   Misc Natural Products (TART CHERRY ADVANCED PO), Take 1,200 mg by mouth daily.    Omega-3 Fatty Acids (SALMON OIL-1000 PO), Take by mouth.   Potassium 99 MG TABS, Take 99 mg by mouth.   traZODone (DESYREL) 100 MG tablet, TAKE 1 TABLET BY MOUTH EVERYDAY AT BEDTIME   Turmeric Curcumin 500 MG CAPS, Take by mouth.   zinc gluconate 50 MG tablet, Take 50 mg by mouth daily.   Reviewed prior external information including notes and imaging from  primary care provider As well as notes that were available from care everywhere and other healthcare systems.  Past medical history, social, surgical and family history all reviewed in electronic medical record.  No pertanent information unless stated regarding to the chief complaint.   Review of Systems:  No headache, visual changes, nausea, vomiting, diarrhea,  constipation, dizziness, abdominal pain, skin rash, fevers, chills, night sweats, weight loss, swollen lymph nodes, body aches, joint swelling, chest pain, shortness of breath, mood changes. POSITIVE muscle aches  Objective  Blood pressure 118/72, pulse 71, height 5\' 9"  (1.753 m), weight (!) 308 lb (139.7 kg), SpO2 98%.   General: No apparent distress alert and oriented x3 mood and affect normal, dressed appropriately.  HEENT: Pupils equal, extraocular movements intact  Respiratory: Patient's speak in full sentences and does not appear short of breath  Severely antalgic at this moment.  The patient's knees do have significant fullness.  Instability with valgus and varus force.  Patient does lacks the last 15 degrees of flexion in the last 5 degrees of extension.  After informed written and verbal consent, patient was seated on exam table. Right knee was prepped with alcohol swab and utilizing anterolateral approach, patient's right knee space was injected with 48 mg per 3 mL of Monovisc (sodium hyaluronate) in a prefilled syringe was injected easily into the knee through a 22-gauge needle..Patient tolerated the procedure well without immediate complications.  After informed written and verbal consent, patient was seated on exam table. Left knee was prepped with alcohol swab and utilizing anterolateral approach, patient's left knee space was injected with 48 mg per 3 mL of Monovisc (sodium hyaluronate) in a prefilled syringe was injected easily into the knee through a 22-gauge needle..Patient tolerated the procedure well without immediate complications.    Impression and Recommendations:     The above documentation has been reviewed and is accurate and complete Judi Saa, DO

## 2022-08-15 ENCOUNTER — Ambulatory Visit (INDEPENDENT_AMBULATORY_CARE_PROVIDER_SITE_OTHER): Payer: Medicare Other | Admitting: Family Medicine

## 2022-08-15 ENCOUNTER — Encounter: Payer: Self-pay | Admitting: Family Medicine

## 2022-08-15 ENCOUNTER — Other Ambulatory Visit: Payer: Self-pay | Admitting: Family Medicine

## 2022-08-15 VITALS — BP 118/72 | HR 71 | Ht 69.0 in | Wt 308.0 lb

## 2022-08-15 DIAGNOSIS — M170 Bilateral primary osteoarthritis of knee: Secondary | ICD-10-CM | POA: Diagnosis not present

## 2022-08-15 MED ORDER — HYALURONAN 88 MG/4ML IX SOSY
176.0000 mg | PREFILLED_SYRINGE | Freq: Once | INTRA_ARTICULAR | Status: AC
Start: 2022-08-15 — End: 2022-08-15
  Administered 2022-08-15: 176 mg via INTRA_ARTICULAR

## 2022-08-15 NOTE — Assessment & Plan Note (Signed)
Chronic problem with worsening symptoms.  Patient has done well with losing weight.  BMI is at 45 and if he can get it under 40 he would be a candidate for replacement.  Discussed with patient can repeat injections if needed.  Will see patient again in 8 to 10 weeks to further evaluate.  Social determinants of health including difficulty with physical activity secondary to the severity of the arthritis locking less than 100 feet at 1 time.

## 2022-08-15 NOTE — Patient Instructions (Addendum)
Monovisc injections today See me again in 2-3 months

## 2022-08-21 ENCOUNTER — Encounter: Payer: Self-pay | Admitting: Family Medicine

## 2022-08-21 ENCOUNTER — Other Ambulatory Visit: Payer: Self-pay | Admitting: Family Medicine

## 2022-08-21 ENCOUNTER — Ambulatory Visit (INDEPENDENT_AMBULATORY_CARE_PROVIDER_SITE_OTHER): Payer: Medicare Other | Admitting: Family Medicine

## 2022-08-21 VITALS — BP 130/74 | HR 71 | Temp 98.4°F | Resp 18 | Ht 69.0 in | Wt 313.4 lb

## 2022-08-21 DIAGNOSIS — Z7984 Long term (current) use of oral hypoglycemic drugs: Secondary | ICD-10-CM

## 2022-08-21 DIAGNOSIS — E119 Type 2 diabetes mellitus without complications: Secondary | ICD-10-CM

## 2022-08-21 DIAGNOSIS — M5416 Radiculopathy, lumbar region: Secondary | ICD-10-CM

## 2022-08-21 DIAGNOSIS — B9689 Other specified bacterial agents as the cause of diseases classified elsewhere: Secondary | ICD-10-CM

## 2022-08-21 DIAGNOSIS — N529 Male erectile dysfunction, unspecified: Secondary | ICD-10-CM

## 2022-08-21 DIAGNOSIS — J329 Chronic sinusitis, unspecified: Secondary | ICD-10-CM

## 2022-08-21 LAB — BASIC METABOLIC PANEL
BUN: 15 mg/dL (ref 6–23)
CO2: 24 mEq/L (ref 19–32)
Calcium: 9.8 mg/dL (ref 8.4–10.5)
Chloride: 102 mEq/L (ref 96–112)
Creatinine, Ser: 0.74 mg/dL (ref 0.40–1.50)
GFR: 94.62 mL/min (ref >60.00)
Glucose, Bld: 162 mg/dL — ABNORMAL HIGH (ref 70–99)
Potassium: 4.3 mEq/L (ref 3.5–5.1)
Sodium: 138 mEq/L (ref 135–145)

## 2022-08-21 LAB — HEMOGLOBIN A1C: Hgb A1c MFr Bld: 6.7 % — ABNORMAL HIGH (ref 4.6–6.5)

## 2022-08-21 MED ORDER — PREDNISONE 10 MG PO TABS: ORAL_TABLET | ORAL | 0 refills | Status: DC

## 2022-08-21 MED ORDER — TADALAFIL 5 MG PO TABS: 5.0000 mg | ORAL_TABLET | Freq: Every day | ORAL | 3 refills | Status: DC

## 2022-08-21 MED ORDER — AMOXICILLIN 875 MG PO TABS: 875.0000 mg | ORAL_TABLET | Freq: Two times a day (BID) | ORAL | 0 refills | Status: AC

## 2022-08-21 NOTE — Patient Instructions (Addendum)
Follow up in 3-4 months to recheck diabetes, blood pressure, and cholesterol We'll notify you of your lab results and make any changes if needed START the Amoxicillin twice daily- take w/ food START the Prednisone as directed- 3 pills at the same time x3 days, then 2 pills at the same time x3 days, then 1 pill daily.  Take w/ food  Continue to work on low carb diet and physical activity as you are able START the Tadalafil daily- STOP the Sildenafil  Call with any questions or concerns Stay Safe! Stay Healthy!

## 2022-08-21 NOTE — Telephone Encounter (Signed)
Pt was on Sildenafil and it was not effective.  Is there a way to do a prior authorization?

## 2022-08-21 NOTE — Telephone Encounter (Signed)
Pharmacy is asking that we change the medication not covered by insurance

## 2022-08-21 NOTE — Telephone Encounter (Signed)
Could you do a PA for Tadalafil l for pt ? He was on Viagra and they did not work. Dx erectile dysfunction

## 2022-08-21 NOTE — Progress Notes (Signed)
   Subjective:    Patient ID: Travis [name], male    DOB: 06/22/1955, 66 y.o.   MRN: [id number]  HPI DM- chronic problem.  On Metformin 1000mg  BID.  UTD on eye exam, microalbumin.  Due for foot exam today.  Last A1C 7.4%.  no CP, SOB, visual changes, abd pain, N/V.  Home CBGs averaging 150s  Sinus pressure- + sinus pain, congestion, drainage.  Will have intermittent productive cough.  Bloody, green nasal drainage.  + HA.  + tooth pain and facial TTP.  Sxs started 'a couple of weeks ago'.    R radicular low back pain- sxs started ~1 week.  Pain will start in hip or buttock and radiate down to leg.  Most notable when sitting or lying down.  Currently on Celebrex BID.  ED- pt reports Sildenafil is no longer effective.  Would like to switch medications.   Review of Systems For ROS see HPI     Objective:   Physical Exam Vitals reviewed.  Constitutional:      General: He is not in acute distress.    Appearance: He is well-developed. He is obese. He is not ill-appearing.  HENT:     Head: Normocephalic and atraumatic.     Right Ear: Tympanic membrane and ear canal normal.     Left Ear: Tympanic membrane and ear canal normal.     Nose: Mucosal edema and congestion present. No rhinorrhea.     Right Sinus: Maxillary sinus tenderness and frontal sinus tenderness present.     Left Sinus: Maxillary sinus tenderness and frontal sinus tenderness present.     Mouth/Throat:     Mouth: Mucous membranes are normal.     Pharynx: Posterior oropharyngeal erythema present. No oropharyngeal exudate or posterior oropharyngeal edema.  Eyes:     Extraocular Movements: EOM normal.     Conjunctiva/sclera: Conjunctivae normal.     Pupils: Pupils are equal, round, and reactive to light.  Cardiovascular:     Rate and Rhythm: Normal rate and regular rhythm.     Heart sounds: Normal heart sounds.  Pulmonary:     Effort: Pulmonary effort is normal. No respiratory distress.     Breath sounds: Normal breath  sounds. No wheezing.  Musculoskeletal:     Cervical back: Normal range of motion and neck supple.     Right lower leg: Edema present.     Left lower leg: Edema present.  Lymphadenopathy:     Cervical: No cervical adenopathy.  Skin:    General: Skin is warm and dry.  Neurological:     Mental Status: He is alert and oriented to person, place, and time.  Psychiatric:        Mood and Affect: Mood normal.        Behavior: Behavior normal.        Thought Content: Thought content normal.           Assessment & Plan:   Bacterial sinusitis- new.  Pt's sxs and PE consistent w/ infxn.  Start Amoxicillin BID.  Reviewed supportive care and red flags that should prompt return.  Pt expressed understanding and is in agreement w/ plan.

## 2022-08-22 ENCOUNTER — Telehealth: Payer: Self-pay

## 2022-08-22 NOTE — Telephone Encounter (Signed)
-----   Message from Neena Rhymes sent at 08/22/2022  7:34 AM EDT ----- A1C looks great!  No changes at this time

## 2022-08-22 NOTE — Telephone Encounter (Signed)
Pt aware of lab results 

## 2022-08-23 ENCOUNTER — Telehealth: Payer: Self-pay

## 2022-08-23 ENCOUNTER — Other Ambulatory Visit (HOSPITAL_COMMUNITY): Payer: Self-pay

## 2022-08-23 NOTE — Telephone Encounter (Signed)
PA request has been Submitted. New Encounter created for follow up. For additional info see Pharmacy Prior Auth telephone encounter from 08/23/22.

## 2022-08-23 NOTE — Telephone Encounter (Signed)
Pharmacy Patient Advocate Encounter   Received notification from RX Request Messages that prior authorization for Tadalafil 5MG  tablets is required/requested.   Insurance verification completed.   The patient is insured through [facility] .   Per test claim: PA required; PA submitted to [facility] via CoverMyMeds Key/confirmation #/EOC [id number] Status is pending

## 2022-08-24 NOTE — Telephone Encounter (Signed)
Pharmacy Patient Advocate Encounter  Received notification from [employer] that Prior Authorization for Tadalafil 5MG  tablets has been DENIED. Please advise how you'd like to proceed. Full denial letter will be uploaded to the media tab. See denial reason below.   PA #/Case ID/Reference #: [id number]    Why did we deny your request? We denied this request under Medicare Part D because: Your plan's Medicare Part D drug plan cannot cover  drugs used for the treatment of sexual or erectile dysfunction.

## 2022-08-24 NOTE — Telephone Encounter (Signed)
Pt has been notified.

## 2022-09-01 ENCOUNTER — Other Ambulatory Visit: Payer: Self-pay | Admitting: Family Medicine

## 2022-09-04 DIAGNOSIS — N529 Male erectile dysfunction, unspecified: Secondary | ICD-10-CM | POA: Insufficient documentation

## 2022-09-04 NOTE — Assessment & Plan Note (Signed)
Chronic problem.  On Metformin 1000mg  BID.  UTD on eye exam, microalbumin.  Foot exam done today.  Pt reports home CBGs ~150.  Otherwise asymptomatic.  Check labs.  Adjust meds prn

## 2022-09-04 NOTE — Assessment & Plan Note (Signed)
Deteriorated.  Pt reports Sildenafil is no longer effective.  Will switch to Cialis and monitor for improvement.

## 2022-09-04 NOTE — Assessment & Plan Note (Signed)
Recurrent problem for pt.  Sxs started ~1 week ago.  Currently on Celebrex BID w/o relief.  Will start Prednisone taper to improve pain and inflammation.  Pt expressed understanding and is in agreement w/ plan.

## 2022-09-13 ENCOUNTER — Ambulatory Visit (INDEPENDENT_AMBULATORY_CARE_PROVIDER_SITE_OTHER): Payer: Medicare Other | Admitting: Podiatry

## 2022-09-13 ENCOUNTER — Encounter: Payer: Self-pay | Admitting: Podiatry

## 2022-09-13 DIAGNOSIS — B351 Tinea unguium: Secondary | ICD-10-CM

## 2022-09-13 DIAGNOSIS — G629 Polyneuropathy, unspecified: Secondary | ICD-10-CM | POA: Diagnosis not present

## 2022-09-13 DIAGNOSIS — E119 Type 2 diabetes mellitus without complications: Secondary | ICD-10-CM | POA: Diagnosis not present

## 2022-09-13 DIAGNOSIS — M79675 Pain in left toe(s): Secondary | ICD-10-CM | POA: Diagnosis not present

## 2022-09-13 DIAGNOSIS — M79674 Pain in right toe(s): Secondary | ICD-10-CM

## 2022-09-13 NOTE — Progress Notes (Signed)
This patient returns to my office for at risk foot care.  This patient requires this care by a professional since this patient will be at risk due to having type 2 diabetes.  This patient is unable to cut nails himself since the patient cannot reach his nails.These nails are painful walking and wearing shoes.  This patient presents for at risk foot care today.  General Appearance  Alert, conversant and in no acute stress.  Vascular  Dorsalis pedis and posterior tibial  pulses are palpable  bilaterally.  Capillary return is within normal limits  bilaterally. Temperature is within normal limits  bilaterally.  Neurologic  Senn-Weinstein monofilament wire test within normal limits  bilaterally. Muscle power within normal limits bilaterally.  Nails Thick disfigured discolored nails with subungual debris  from hallux to fifth toes bilaterally. No evidence of bacterial infection or drainage bilaterally.  Orthopedic  No limitations of motion  feet .  No crepitus or effusions noted.  HAV  B/L.  Hammer toes  B/L.  Skin  normotropic skin with no porokeratosis noted bilaterally.  No signs of infections or ulcers noted.     Onychomycosis  Pain in right toes  Pain in left toes  Consent was obtained for treatment procedures.   Mechanical debridement of nails 1-5  bilaterally performed with a nail nipper.  Filed with dremel without incident.    Return office visit    3 months                  Told patient to return for periodic foot care and evaluation due to potential at risk complications.   Gardiner Barefoot DPM

## 2022-09-16 ENCOUNTER — Other Ambulatory Visit: Payer: Self-pay | Admitting: Family Medicine

## 2022-09-29 NOTE — Progress Notes (Unsigned)
HPI- Male never smoker followed for OSA, complicated by morbid obesity, CAD/MI/PTCA, HBP, DM, massive hernia NPSG  10/14/14 AHI 124.2/ hr, CPAP to 16, desat to 69%, weight 390 lbs  ===========================================================   09/28/21- 67 year old male never smoker followed for OSA, complicated by Morbid Obesity, CAD/MI/PTCA, HTN,  DM, Pulm HTN, Diverticulosis, DM2, Hyperlipidemia, Neuropathy, Hx Guillain Barre Syndrome, Osteoarthritis, hx Prostate Cancer,  -albuterol hfa,  CPAP auto 10-20 / [facility]  [place]  Download compliance-90%, AHI 2.9/ hr Body weight today-317 lbs Covid vax-none ( hx GB ) Flu vax- declines He avoids all vaccinations due to hx Guillain Barre. Reports comfortable and sleeping well with CPAP. Occasional fretful night. PCP gave Trazodone to help insomnia. We discussed reading if unable to sleep.  10/01/22- 67 year old male never smoker followed for OSA, complicated by Morbid Obesity, CAD/MI/PTCA, HTN,  DM, Pulm HTN, Diverticulosis, DM2, Hyperlipidemia, Neuropathy, Hx Guillain Barre Syndrome, Osteoarthritis, hx Prostate Cancer,  -albuterol hfa,  CPAP auto 10-20 / [facility]  [place]  Download compliance- Body weight today-  ROS-see HPI   + = positive Constitutional:    weight loss, night sweats, fevers, chills, fatigue, lassitude. HEENT:    headaches, difficulty swallowing, tooth/dental problems, sore throat,       sneezing, itching, ear ache, + nasal congestion, post nasal drip, snoring CV:    chest pain, orthopnea, PND, swelling in lower extremities, anasarca,                                                       dizziness, + palpitations Resp:   + shortness of breath with exertion or at rest.                productive cough,   non-productive cough, coughing up of blood.              change in color of mucus.  wheezing.   Skin:    rash or lesions. GI:  No-   heartburn, indigestion, abdominal pain, nausea, vomiting, diarrhea,                 change in bowel  habits, loss of appetite GU: dysuria, change in color of urine, no urgency or frequency.   flank pain. MS:  + joint pain, stiffness, decreased range of motion, back pain. Neuro-     nothing unusual Psych:  change in mood or affect.  depression or anxiety.   memory loss.  OBJ- Physical Exam General- Alert, Oriented, Affect-appropriate, Distress- none acute, + morbidly obese Skin- rash-none, lesions- none, excoriation- none Lymphadenopathy- none Head- atraumatic            Eyes- Gross vision intact, PERRLA, conjunctivae and secretions clear            Ears- Hearing, canals-normal            Nose- Clear, no-Septal dev, mucus, polyps, erosion, perforation             Throat- Mallampati IV , mucosa clear , drainage- none, tonsils- atrophic Neck- flexible , trachea midline, no stridor , thyroid nl, carotid no bruit Chest - symmetrical excursion , unlabored           Heart/CV- RRR , no murmur , no gallop  , no rub, nl s1 s2                           -  JVD- none , edema- none, stasis changes- none, varices- none           Lung- clear to P&A, wheeze- none, cough- none , dullness-none, rub- none           Chest wall-  Abd- + massive pannus and significant obesity Br/ Gen/ Rectal- Not done, not indicated Extrem- +brace L wrist "arthritis" Neuro- grossly intact to observation

## 2022-10-01 ENCOUNTER — Encounter: Payer: Self-pay | Admitting: Internal Medicine

## 2022-10-01 ENCOUNTER — Ambulatory Visit (INDEPENDENT_AMBULATORY_CARE_PROVIDER_SITE_OTHER): Payer: Medicare Other | Admitting: Internal Medicine

## 2022-10-01 VITALS — BP 130/70 | HR 67 | Temp 97.8°F | Ht 67.0 in | Wt 321.6 lb

## 2022-10-01 DIAGNOSIS — G4733 Obstructive sleep apnea (adult) (pediatric): Secondary | ICD-10-CM | POA: Diagnosis not present

## 2022-10-01 DIAGNOSIS — J4522 Mild intermittent asthma with status asthmaticus: Secondary | ICD-10-CM | POA: Insufficient documentation

## 2022-10-01 NOTE — Assessment & Plan Note (Signed)
Occasional rescue inhaler seems sufficient

## 2022-10-01 NOTE — Patient Instructions (Signed)
Glad you are doing well with your CPAP.  We can continue CPAP auto 10-20 We can refill your inhaler if needed  Please call if we can help

## 2022-10-01 NOTE — Assessment & Plan Note (Signed)
Benefits from CPAP. Doing well. Plan- continue auto 10-20

## 2022-10-12 NOTE — Progress Notes (Unsigned)
[name] [address] [place] [address] Phone: [phone] Subjective:   Travis [name], am serving as a scribe for Dr. Antoine Primas.  I'm seeing this patient by the request  of:  Sheliah Hatch, MD  CC: bilateral knee pain   ION:[id number]  08/15/2022 Chronic problem with worsening symptoms.  Patient has done well with losing weight.  BMI is at 45 and if he can get it under 40 he would be a candidate for replacement.  Discussed with patient can repeat injections if needed.  Will see patient again in 8 to 10 weeks to further evaluate.  Social determinants of health including difficulty with physical activity secondary to the severity of the arthritis locking less than 100 feet at 1 time.    Update 10/17/2022 Travis [name] is a 67 y.o. male coming in with complaint of B knee pain. Patient states that he is stiff today. Is here for injections today. L>R. Intermittent sharp pain when seated in L knee.     Past Medical History:  Diagnosis Date   Appendicitis    Blood in stool    Coronary artery disease 1997   s/p PCI by Dr. Aleen Campi   Diverticulosis of colon with hemorrhage 2009   Dyslipidemia, goal LDL below 70    Edema    Encounter for long-term (current) use of other medications    Essential hypertension, benign    Family history of thyroid disease    Frequent unifocal PVCs    GBS (Guillain Barre syndrome) (HCC)    Morbid obesity (HCC)    Myalgia and myositis, unspecified    Prostate cancer (HCC) 2012   Pulmonary HTN (HCC) 08/14/2018   PASP by echo 2017   Sleep apnea with use of continuous positive airway pressure (CPAP)    Thrombocytopenia (HCC) 05/25/2014   Type II or unspecified type diabetes mellitus without mention of complication, not stated as uncontrolled    Past Surgical History:  Procedure Laterality Date   ANGIOPLASTY  1997   Dr. 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   Right-handed.   Caffeine: occasional use.   Social Determinants of Health   Financial Resource Strain: Low Risk  (05/23/2022)   Overall Financial Resource Strain (CARDIA)    Difficulty of Paying Living Expenses: Not hard at all  Food Insecurity: No Food Insecurity (05/23/2022)   Hunger Vital Sign    Worried About Running Out of Food in the Last Year: Never true    Ran Out of Food in the Last Year: Never true  Transportation Needs: No Transportation Needs (05/23/2022)   PRAPARE - Transportation  Lack of Transportation (Medical): No    Lack of Transportation (Non-Medical): No  Physical Activity: Inactive (05/23/2022)   Exercise Vital Sign    Days of Exercise per Week: 0 days    Minutes of Exercise per Session: 0 min  Stress: No Stress Concern Present (05/23/2022)   [employer] of Occupational Health - Occupational Stress Questionnaire    Feeling of Stress : Not at all  Recent Concern: Stress - Stress Concern Present (05/21/2022)   [employer] of Occupational Health - Occupational Stress Questionnaire    Feeling of Stress : To some extent  Social Connections: Moderately Isolated (05/23/2022)   Social Connection and Isolation Panel [NHANES]    Frequency of Communication with Friends and Family: More than three times a week     Frequency of Social Gatherings with Friends and Family: Once a week    Attends Religious Services: Never    Database administrator or Organizations: No    Attends Engineer, structural: Never    Marital Status: Married   Allergies  Allergen Reactions   Crestor [Rosuvastatin]     Muscle Pain    Lipitor [Atorvastatin] Other (See Comments)    Arm weakness    Family History  Problem Relation Age of Onset   CVA Father    Hypertension Father    Diabetes Mother    Hypothyroidism Sister    Hypertension Sister     Current Outpatient Medications (Endocrine & Metabolic):    metFORMIN (GLUCOPHAGE) 500 MG tablet, TAKE 2 TABLETS BY MOUTH 2 TIMES DAILY WITH A MEAL.   predniSONE (DELTASONE) 10 MG tablet, 3 tabs x3 days and then 2 tabs x3 days and then 1 tab x3 days.  Take w/ food.  Current Outpatient Medications (Cardiovascular):    amLODipine (NORVASC) 10 MG tablet, TAKE 1 TABLET BY MOUTH EVERY DAY   fenofibrate 160 MG tablet, TAKE 1 TABLET BY MOUTH EVERY DAY   furosemide (LASIX) 20 MG tablet, TAKE 1 TO 2 TABLETS BY MOUTH TWICE A DAY AS NEEDED FOR EDEMA   lisinopril (ZESTRIL) 40 MG tablet, TAKE 1 TABLET BY MOUTH EVERY DAY   metoprolol succinate (TOPROL-XL) 100 MG 24 hr tablet, TAKE 1 TABLET (100 MG TOTAL) BY MOUTH DAILY. KEEP OV.   rosuvastatin (CRESTOR) 20 MG tablet, TAKE 1 TABLET BY MOUTH EVERY DAY   tadalafil (CIALIS) 5 MG tablet, Take 1 tablet (5 mg total) by mouth daily.  Current Outpatient Medications (Respiratory):    albuterol (VENTOLIN HFA) 108 (90 Base) MCG/ACT inhaler, TAKE 2 PUFFS BY MOUTH EVERY 6 HOURS AS NEEDED FOR WHEEZE OR SHORTNESS OF BREATH  Current Outpatient Medications (Analgesics):    acetaminophen (TYLENOL) 500 MG tablet, Take 1,000 mg by mouth every 6 (six) hours as needed for moderate pain. Taking 650 mg arthritis BID   allopurinol (ZYLOPRIM) 100 MG tablet, Take 2 tablets (200 mg total) by mouth daily.   aspirin EC 81 MG tablet, Take 81 mg by mouth  daily.   celecoxib (CELEBREX) 200 MG capsule, TAKE 1 CAPSULE BY MOUTH TWICE A DAY  Current Outpatient Medications (Hematological):    Cyanocobalamin (VITAMIN B-12 PO), Take 1,000 mcg by mouth.   Current Outpatient Medications (Other):    APPLE CIDER VINEGAR PO, Take 450 mg by mouth.   Ascorbic Acid (VITAMIN C) 500 MG CAPS, Take by mouth.   Blood Glucose Monitoring Suppl (FREESTYLE LITE) DEVI, Use to test sugars twice daily.Dx E11.9   buPROPion (WELLBUTRIN XL) 300 MG 24 hr tablet, TAKE  1 TABLET BY MOUTH EVERY DAY   Cholecalciferol (VITAMIN D3 PO), Take 2,000 Units by mouth daily.    Coconut Oil 1000 MG CAPS, Take by mouth.   gabapentin (NEURONTIN) 300 MG capsule, TAKE 1 CAPSULE BY MOUTH THREE TIMES A DAY   Garlic 10 MG CAPS, Take by mouth.   glucosamine-chondroitin 500-400 MG tablet, Take 1 tablet by mouth 3 (three) times daily.   glucose blood (FREESTYLE LITE) test strip, Use to check blood glucose once daily (Dx: E 11.9 - type 2 DM without long term insulin use)   Lancets (FREESTYLE) lancets, Use to check blood glucose once daily (Dx: E 11.9 - type 2 DM without long term insulin use)   magnesium gluconate (MAGONATE) 500 MG tablet, Take 500 mg by mouth daily.    milk thistle 175 MG tablet, Take 175 mg by mouth daily.   Misc Natural Products (TART CHERRY ADVANCED PO), Take 1,200 mg by mouth daily.    Omega-3 Fatty Acids (SALMON OIL-1000 PO), Take by mouth.   Potassium 99 MG TABS, Take 99 mg by mouth.   traZODone (DESYREL) 100 MG tablet, TAKE 1 TABLET BY MOUTH EVERYDAY AT BEDTIME   Turmeric Curcumin 500 MG CAPS, Take by mouth.   zinc gluconate 50 MG tablet, Take 50 mg by mouth daily.   Reviewed prior external information including notes and imaging from  primary care provider As well as notes that were available from care everywhere and other healthcare systems.  Past medical history, social, surgical and family history all reviewed in electronic medical record.  No pertanent  information unless stated regarding to the chief complaint.   Review of Systems:  No headache, visual changes, nausea, vomiting, diarrhea, constipation, dizziness, abdominal pain, skin rash, fevers, chills, night sweats, weight loss, swollen lymph nodes, body aches, chest pain, shortness of breath, mood changes. POSITIVE muscle aches, joint swelling   Objective  Blood pressure 120/84, pulse 76, height 5\' 7"  (1.702 m), weight (!) 317 lb (143.8 kg), SpO2 98%.   General: No apparent distress alert and oriented x3 mood and affect normal, dressed appropriately.  HEENT: Pupils equal, extraocular movements intact  Bilateral knee exam shows the patient does have crepitus noted.  Trace effusion noted with only 90 degrees.  Instability with valgus and varus force.  After informed written and verbal consent, patient was seated on exam table. Right knee was prepped with alcohol swab and utilizing anterolateral approach, patient's right knee space was injected with 4:1  marcaine 0.5%: Kenalog 40mg /dL. Patient tolerated the procedure well without immediate complications.  After informed written and verbal consent, patient was seated on exam table. Left knee was prepped with alcohol swab and utilizing anterolateral approach, patient's left knee space was injected with 4:1  marcaine 0.5%: Kenalog 40mg /dL. Patient tolerated the procedure well without immediate complications.    Impression and Recommendations:    The above documentation has been reviewed and is accurate and complete Judi Saa, DO

## 2022-10-17 ENCOUNTER — Ambulatory Visit (INDEPENDENT_AMBULATORY_CARE_PROVIDER_SITE_OTHER): Payer: Medicare Other | Admitting: Family Medicine

## 2022-10-17 VITALS — BP 120/84 | HR 76 | Ht 67.0 in | Wt 317.0 lb

## 2022-10-17 DIAGNOSIS — M170 Bilateral primary osteoarthritis of knee: Secondary | ICD-10-CM

## 2022-10-17 NOTE — Assessment & Plan Note (Signed)
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  Secondary to comorbidities and social determinants of health patient has difficulty with transportation as well as physical activity and walking greater than 100 feet.  Secondary to BMI and patient is not a surgical candidate for any type of replacement.  Still need to work on getting the BMI under 40 for this.  Encourage continue to work on weight loss.  Does respond well to the injections.  Hopefully steroid injections will help again.  Can repeat viscosupplementation in the end of January if needed.  Follow-up with me again in 2 to 3 months

## 2022-10-17 NOTE — Patient Instructions (Signed)
Injected both knees today See me again in 2 months

## 2022-11-11 ENCOUNTER — Other Ambulatory Visit: Payer: Self-pay | Admitting: Family Medicine

## 2022-11-13 ENCOUNTER — Encounter: Payer: Self-pay | Admitting: Family Medicine

## 2022-11-13 MED ORDER — TADALAFIL 5 MG PO TABS: 5.0000 mg | ORAL_TABLET | Freq: Every day | ORAL | 2 refills | Status: DC

## 2022-11-13 MED ORDER — ROSUVASTATIN CALCIUM 20 MG PO TABS: 20.0000 mg | ORAL_TABLET | Freq: Every day | ORAL | 0 refills | Status: DC

## 2022-11-22 ENCOUNTER — Encounter: Payer: Self-pay | Admitting: Family Medicine

## 2022-11-22 ENCOUNTER — Telehealth: Payer: Self-pay

## 2022-11-22 ENCOUNTER — Ambulatory Visit: Payer: Medicare Other | Admitting: Family Medicine

## 2022-11-22 VITALS — BP 128/80 | HR 72 | Temp 98.8°F | Ht 67.0 in | Wt 315.0 lb

## 2022-11-22 DIAGNOSIS — I10 Essential (primary) hypertension: Secondary | ICD-10-CM | POA: Diagnosis not present

## 2022-11-22 DIAGNOSIS — Z7984 Long term (current) use of oral hypoglycemic drugs: Secondary | ICD-10-CM

## 2022-11-22 DIAGNOSIS — E119 Type 2 diabetes mellitus without complications: Secondary | ICD-10-CM | POA: Diagnosis not present

## 2022-11-22 DIAGNOSIS — E1169 Type 2 diabetes mellitus with other specified complication: Secondary | ICD-10-CM | POA: Diagnosis not present

## 2022-11-22 DIAGNOSIS — E785 Hyperlipidemia, unspecified: Secondary | ICD-10-CM | POA: Diagnosis not present

## 2022-11-22 LAB — HEPATIC FUNCTION PANEL
ALT: 28 U/L (ref 0–53)
AST: 30 U/L (ref 0–37)
Albumin: 4.2 g/dL (ref 3.5–5.2)
Alkaline Phosphatase: 54 U/L (ref 39–117)
Bilirubin, Direct: 0.1 mg/dL (ref 0.0–0.3)
Total Bilirubin: 0.4 mg/dL (ref 0.2–1.2)
Total Protein: 6.8 g/dL (ref 6.0–8.3)

## 2022-11-22 LAB — CBC WITH DIFFERENTIAL/PLATELET
Basophils Absolute: 0 10*3/uL (ref 0.0–0.1)
Basophils Relative: 0.2 % (ref 0.0–3.0)
Eosinophils Absolute: 0 10*3/uL (ref 0.0–0.7)
Eosinophils Relative: 0 % (ref 0.0–5.0)
HCT: 42.7 % (ref 39.0–52.0)
Hemoglobin: 14.2 g/dL (ref 13.0–17.0)
Lymphocytes Relative: 16.5 % (ref 12.0–46.0)
Lymphs Abs: 1 10*3/uL (ref 0.7–4.0)
MCHC: 33.3 g/dL (ref 30.0–36.0)
MCV: 94.4 fL (ref 78.0–100.0)
Monocytes Absolute: 0.4 10*3/uL (ref 0.1–1.0)
Monocytes Relative: 6.6 % (ref 3.0–12.0)
Neutro Abs: 4.7 10*3/uL (ref 1.4–7.7)
Neutrophils Relative %: 76.7 % (ref 43.0–77.0)
Platelets: 213 10*3/uL (ref 150.0–400.0)
RBC: 4.52 Mil/uL (ref 4.22–5.81)
RDW: 13.9 % (ref 11.5–15.5)
WBC: 6.1 10*3/uL (ref 4.0–10.5)

## 2022-11-22 LAB — BASIC METABOLIC PANEL
BUN: 19 mg/dL (ref 6–23)
CO2: 27 meq/L (ref 19–32)
Calcium: 10.1 mg/dL (ref 8.4–10.5)
Chloride: 103 meq/L (ref 96–112)
Creatinine, Ser: 0.88 mg/dL (ref 0.40–1.50)
GFR: 89.63 mL/min (ref >60.00)
Glucose, Bld: 193 mg/dL — ABNORMAL HIGH (ref 70–99)
Potassium: 4.5 meq/L (ref 3.5–5.1)
Sodium: 138 meq/L (ref 135–145)

## 2022-11-22 LAB — TSH: TSH: 1.75 u[IU]/mL (ref 0.35–5.50)

## 2022-11-22 LAB — LIPID PANEL
Cholesterol: 130 mg/dL (ref 0–200)
HDL: 34.5 mg/dL — ABNORMAL LOW (ref >39.00)
LDL Cholesterol: 51 mg/dL (ref 0–99)
NonHDL: 95.81
Total CHOL/HDL Ratio: 4
Triglycerides: 224 mg/dL — ABNORMAL HIGH (ref 0.0–149.0)
VLDL: 44.8 mg/dL — ABNORMAL HIGH (ref 0.0–40.0)

## 2022-11-22 LAB — HEMOGLOBIN A1C: Hgb A1c MFr Bld: 6.8 % — ABNORMAL HIGH (ref 4.6–6.5)

## 2022-11-22 NOTE — Progress Notes (Signed)
   Subjective:    Patient ID: Travis [name], male    DOB: [birth year], 67 y.o.   MRN: [id number]  HPI DM- chronic problem, on Metformin 1000 BID.  UTD on eye exam, foot exam.  Due for microalbumin.  Denies symptomatic lows.  + L hand carpal tunnel but no other numbness/tingling of hands/feet.  HTN- chronic problem, on Lasix 20mg , Metoprolol XL 100mg  daily, Lisinopril 40mg  daily, Amlodipine 10mg  daily w/ good control.  No CP, SOB, HA's, visual changes, edema.  Hyperlipidemia- chronic problem, on Crestor 20mg  daily, Fenofibrate 160mg  daily.  No abd pain, N/V.   Review of Systems For ROS see HPI     Objective:   Physical Exam Vitals reviewed.  Constitutional:      General: He is not in acute distress.    Appearance: Normal appearance. He is well-developed. He is obese. He is not ill-appearing.  HENT:     Head: Normocephalic and atraumatic.  Eyes:     Extraocular Movements: Extraocular movements intact.     Conjunctiva/sclera: Conjunctivae normal.     Pupils: Pupils are equal, round, and reactive to light.  Neck:     Thyroid: No thyromegaly.  Cardiovascular:     Rate and Rhythm: Normal rate and regular rhythm.     Pulses: Normal pulses.     Heart sounds: Normal heart sounds. No murmur heard. Pulmonary:     Effort: Pulmonary effort is normal. No respiratory distress.     Breath sounds: Normal breath sounds.  Abdominal:     General: Bowel sounds are normal. There is no distension.     Palpations: Abdomen is soft.  Musculoskeletal:     Cervical back: Normal range of motion and neck supple.     Right lower leg: No edema.     Left lower leg: No edema.  Lymphadenopathy:     Cervical: No cervical adenopathy.  Skin:    General: Skin is warm and dry.  Neurological:     General: No focal deficit present.     Mental Status: He is alert and oriented to person, place, and time.     Cranial Nerves: No cranial nerve deficit.  Psychiatric:        Mood and Affect: Mood normal.         Behavior: Behavior normal.           Assessment & Plan:

## 2022-11-22 NOTE — Assessment & Plan Note (Signed)
Chronic problem.  On Crestor 20mg  daily, Fenofibrate 160mg  daily w/o difficulty.  Check labs.  Adjust meds prn

## 2022-11-22 NOTE — Assessment & Plan Note (Signed)
Chronic problem, on multiple medications.  On Amlodipine 10mg  daily, Lasix 20mg  PRN, Metoprolol XL 100mg  daily, Lisinopril 40mg  daily w/ good control.  Currently asymptomatic.  Check labs due to ACE and diuretic use.  No anticipated med changes.  Will follow.

## 2022-11-22 NOTE — Telephone Encounter (Signed)
-----   Message from Neena Rhymes sent at 11/22/2022  3:15 PM EST ----- Labs are stable and look good.  No changes at this time

## 2022-11-22 NOTE — Assessment & Plan Note (Signed)
Chronic problem.  On Metformin 1000mg  BID.  UTD on eye exam, foot exam.  Due for microalbumin.  Currently asymptomatic.  Check labs.  Adjust meds prn

## 2022-11-22 NOTE — Patient Instructions (Signed)
Follow up in 3-4 months to recheck sugar We'll notify you of your lab results and make any changes if needed Continue to work on healthy diet and regular exercise- you can do it! Make sure you are taking a daily Claritin or Zyrtec for the allergies Call with any questions or concerns Stay Safe!  Stay Healthy! Happy Fall!!

## 2022-11-27 ENCOUNTER — Other Ambulatory Visit: Payer: Self-pay | Admitting: Family Medicine

## 2022-12-06 ENCOUNTER — Other Ambulatory Visit: Payer: Self-pay | Admitting: Family Medicine

## 2022-12-12 ENCOUNTER — Ambulatory Visit: Payer: Medicare Other | Admitting: Podiatry

## 2022-12-12 ENCOUNTER — Encounter: Payer: Self-pay | Admitting: Podiatry

## 2022-12-12 DIAGNOSIS — M79674 Pain in right toe(s): Secondary | ICD-10-CM | POA: Diagnosis not present

## 2022-12-12 DIAGNOSIS — B351 Tinea unguium: Secondary | ICD-10-CM | POA: Diagnosis not present

## 2022-12-12 DIAGNOSIS — M79675 Pain in left toe(s): Secondary | ICD-10-CM | POA: Diagnosis not present

## 2022-12-12 DIAGNOSIS — E119 Type 2 diabetes mellitus without complications: Secondary | ICD-10-CM

## 2022-12-12 DIAGNOSIS — G629 Polyneuropathy, unspecified: Secondary | ICD-10-CM

## 2022-12-12 NOTE — Progress Notes (Signed)
This patient returns to my office for at risk foot care.  This patient requires this care by a professional since this patient will be at risk due to having type 2 diabetes.  This patient is unable to cut nails himself since the patient cannot reach his nails.These nails are painful walking and wearing shoes.  This patient presents for at risk foot care today.  General Appearance  Alert, conversant and in no acute stress.  Vascular  Dorsalis pedis and posterior tibial  pulses are palpable  bilaterally.  Capillary return is within normal limits  bilaterally. Temperature is within normal limits  bilaterally.  Neurologic  Senn-Weinstein monofilament wire test within normal limits  bilaterally. Muscle power within normal limits bilaterally.  Nails Thick disfigured discolored nails with subungual debris  from hallux to fifth toes bilaterally. No evidence of bacterial infection or drainage bilaterally.  Orthopedic  No limitations of motion  feet .  No crepitus or effusions noted.  HAV  B/L.  Hammer toes  B/L.  Skin  normotropic skin with no porokeratosis noted bilaterally.  No signs of infections or ulcers noted.     Onychomycosis  Pain in right toes  Pain in left toes  Consent was obtained for treatment procedures.   Mechanical debridement of nails 1-5  bilaterally performed with a nail nipper.  Filed with dremel without incident.    Return office visit    3 months                  Told patient to return for periodic foot care and evaluation due to potential at risk complications.   Helane Gunther DPM

## 2022-12-14 ENCOUNTER — Ambulatory Visit: Payer: Medicare Other | Admitting: Podiatry

## 2022-12-18 NOTE — Progress Notes (Unsigned)
[name] [address] [place] [address] Phone: [phone] Subjective:   INadine Counts, am serving as a scribe for Dr. Antoine Primas.  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 Secondary to comorbidities and social determinants of health patient has difficulty with transportation as well as physical activity and walking greater than 100 feet. Secondary to BMI and patient is not a surgical candidate for any type of replacement. Still need to work on getting the BMI under 40 for this. Encourage continue to work on weight loss. Does respond well to the injections. Hopefully steroid injections will help again. Can repeat viscosupplementation in the end of January if needed. Follow-up with me again in 2 to 3 months   Update 12/19/2022 Travis [name] is a 66 y.o. male coming in with complaint of B knee pain. Patient states here for knee injections.  Patient has had significant amount of discomfort and pain.  Still has it where it affects daily activities.  Continues to have some instability noted.  Sometimes does seem to hyperextend he states.      Past Medical History:  Diagnosis Date   Appendicitis    Blood in stool    Coronary artery disease 1997   s/p PCI by Dr. Aleen Campi   Diverticulosis of colon with hemorrhage 2009   Dyslipidemia, goal LDL below 70    Edema    Encounter for long-term (current) use of other medications    Essential hypertension, benign    Family history of thyroid disease    Frequent unifocal PVCs    GBS (Guillain Barre syndrome) (HCC)    Morbid obesity (HCC)    Myalgia and myositis, unspecified    Prostate cancer (HCC) 2012   Pulmonary HTN (HCC) 08/14/2018   PASP by echo 2017   Sleep apnea with use of continuous positive airway pressure (CPAP)    Thrombocytopenia (HCC) 05/25/2014    Type II or unspecified type diabetes mellitus without mention of complication, not stated as uncontrolled    Past Surgical History:  Procedure Laterality Date   ANGIOPLASTY  1997   Dr. 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   Right-handed.   Caffeine: occasional use.   Social Determinants of Health   Financial Resource Strain: Low Risk  (05/23/2022)   Overall Financial Resource Strain (CARDIA)    Difficulty of Paying Living Expenses: Not hard at all  Food Insecurity: No Food Insecurity (05/23/2022)   Hunger Vital Sign    Worried About Running Out  of Food in the Last Year: Never true    Ran Out of Food in the Last Year: Never true  Transportation Needs: No Transportation Needs (05/23/2022)   PRAPARE - Administrator, Civil Service (Medical): No    Lack of Transportation (Non-Medical): No  Physical Activity: Inactive (05/23/2022)   Exercise Vital Sign    Days of Exercise per Week: 0 days    Minutes of Exercise per Session: 0 min  Stress: No Stress Concern Present (05/23/2022)   [employer] of Occupational Health - Occupational Stress Questionnaire    Feeling of Stress : Not at all  Recent Concern: Stress - Stress Concern Present (05/21/2022)   [employer] of Occupational Health -  Occupational Stress Questionnaire    Feeling of Stress : To some extent  Social Connections: Moderately Isolated (05/23/2022)   Social Connection and Isolation Panel [NHANES]    Frequency of Communication with Friends and Family: More than three times a week    Frequency of Social Gatherings with Friends and Family: Once a week    Attends Religious Services: Never    Database administrator or Organizations: No    Attends Engineer, structural: Never    Marital Status: Married   Allergies  Allergen Reactions   Crestor [Rosuvastatin]     Muscle Pain    Lipitor [Atorvastatin] Other (See Comments)    Arm weakness    Family History  Problem Relation Age of Onset   CVA Father    Hypertension Father    Diabetes Mother    Hypothyroidism Sister    Hypertension Sister     Current Outpatient Medications (Endocrine & Metabolic):    metFORMIN (GLUCOPHAGE) 500 MG tablet, TAKE 2 TABLETS BY MOUTH 2 TIMES DAILY WITH A MEAL.  Current Outpatient Medications (Cardiovascular):    amLODipine (NORVASC) 10 MG tablet, TAKE 1 TABLET BY MOUTH EVERY DAY   fenofibrate 160 MG tablet, TAKE 1 TABLET BY MOUTH EVERY DAY   furosemide (LASIX) 20 MG tablet, TAKE 1 TO 2 TABLETS BY MOUTH TWICE A DAY AS NEEDED FOR EDEMA   lisinopril (ZESTRIL) 40 MG tablet, TAKE 1 TABLET BY MOUTH EVERY DAY   metoprolol succinate (TOPROL-XL) 100 MG 24 hr tablet, TAKE 1 TABLET (100 MG TOTAL) BY MOUTH DAILY. KEEP OV.   rosuvastatin (CRESTOR) 20 MG tablet, Take 1 tablet (20 mg total) by mouth daily.   tadalafil (CIALIS) 5 MG tablet, Take 1 tablet (5 mg total) by mouth daily.  Current Outpatient Medications (Respiratory):    albuterol (VENTOLIN HFA) 108 (90 Base) MCG/ACT inhaler, TAKE 2 PUFFS BY MOUTH EVERY 6 HOURS AS NEEDED FOR WHEEZE OR SHORTNESS OF BREATH  Current Outpatient Medications (Analgesics):    acetaminophen (TYLENOL) 500 MG tablet, Take 1,000 mg by mouth every 6 (six) hours as needed for moderate pain. Taking 650  mg arthritis BID   allopurinol (ZYLOPRIM) 100 MG tablet, TAKE 2 TABLETS BY MOUTH EVERY DAY   aspirin EC 81 MG tablet, Take 81 mg by mouth daily.   celecoxib (CELEBREX) 200 MG capsule, TAKE 1 CAPSULE BY MOUTH TWICE A DAY  Current Outpatient Medications (Hematological):    Cyanocobalamin (VITAMIN B-12 PO), Take 1,000 mcg by mouth.   Current Outpatient Medications (Other):    APPLE CIDER VINEGAR PO, Take 450 mg by mouth.   Ascorbic Acid (VITAMIN C) 500 MG CAPS, Take by mouth.   Blood Glucose Monitoring Suppl (FREESTYLE LITE) DEVI, Use to test sugars twice daily.Dx E11.9  buPROPion (WELLBUTRIN XL) 300 MG 24 hr tablet, TAKE 1 TABLET BY MOUTH EVERY DAY   Cholecalciferol (VITAMIN D3 PO), Take 2,000 Units by mouth daily.    gabapentin (NEURONTIN) 300 MG capsule, TAKE 1 CAPSULE BY MOUTH THREE TIMES A DAY   glucosamine-chondroitin 500-400 MG tablet, Take 1 tablet by mouth 3 (three) times daily.   glucose blood (FREESTYLE LITE) test strip, Use to check blood glucose once daily (Dx: E 11.9 - type 2 DM without long term insulin use)   Lancets (FREESTYLE) lancets, Use to check blood glucose once daily (Dx: E 11.9 - type 2 DM without long term insulin use)   magnesium gluconate (MAGONATE) 500 MG tablet, Take 500 mg by mouth daily.    milk thistle 175 MG tablet, Take 175 mg by mouth daily.   Misc Natural Products (TART CHERRY ADVANCED PO), Take 1,200 mg by mouth daily.    Omega-3 Fatty Acids (SALMON OIL-1000 PO), Take by mouth.   Potassium 99 MG TABS, Take 99 mg by mouth.   traZODone (DESYREL) 100 MG tablet, TAKE 1 TABLET BY MOUTH EVERYDAY AT BEDTIME   Turmeric Curcumin 500 MG CAPS, Take by mouth.   zinc gluconate 50 MG tablet, Take 50 mg by mouth daily.   Reviewed prior external information including notes and imaging from  primary care provider As well as notes that were available from care everywhere and other healthcare systems.  Past medical history, social, surgical and family history all  reviewed in electronic medical record.  No pertanent information unless stated regarding to the chief complaint.   Review of Systems:  No headache, visual changes, nausea, vomiting, diarrhea, constipation, dizziness, abdominal pain, skin rash, fevers, chills, night sweats, weight loss, swollen lymph nodes, body aches, joint swelling, chest pain, shortness of breath, mood changes. POSITIVE muscle aches  Objective  Blood pressure 132/84, pulse 85, height 5\' 7"  (1.702 m), weight (!) 311 lb (141.1 kg), SpO2 95%.   General: No apparent distress alert and oriented x3 mood and affect normal, dressed appropriately.  HEENT: Pupils equal, extraocular movements intact  Patient does ambulate with the aid of a walker.  Patient's knees do have significant arthritic changes but very minimal swelling today which seems to be improved.  Does have instability noted but with varus and valgus force.  After informed written and verbal consent, patient was seated on exam table. Left knee was prepped with alcohol swab and utilizing anterolateral approach, patient's left knee space was injected with 4:1  marcaine 0.5%: Kenalog 40mg /dL. Patient tolerated the procedure well without immediate complications.  After informed written and verbal consent, patient was seated on exam table. Right knee was prepped with alcohol swab and utilizing anterolateral approach, patient's right knee space was injected with 4:1  marcaine 0.5%: Kenalog 40mg /dL. Patient tolerated the procedure well without immediate complications.   Impression and Recommendations:    The above documentation has been reviewed and is accurate and complete Judi Saa, DO

## 2022-12-20 ENCOUNTER — Ambulatory Visit: Payer: Medicare Other | Admitting: Family Medicine

## 2022-12-20 ENCOUNTER — Encounter: Payer: Self-pay | Admitting: Family Medicine

## 2022-12-20 VITALS — BP 132/84 | HR 85 | Ht 67.0 in | Wt 311.0 lb

## 2022-12-20 DIAGNOSIS — M170 Bilateral primary osteoarthritis of knee: Secondary | ICD-10-CM

## 2022-12-20 NOTE — Assessment & Plan Note (Signed)
Chronic problem with exacerbation again.  Continues to respond somewhat to the injections after evaluation decided to do injections again.  Arthritis is coming continue to have progression.  Patient is ambulatory with an abnormal thigh to calf ratio and instability of the knees noted and so a custom OA brace could be beneficial but patient wants to consider it.  Medication management we discussed with the Celebrex 200 mg twice a day as needed.  Past medical history of patient's weight makes him not a surgical candidate until BMI is under 40.  Social determinant of health includes being physically inactive secondary to patient's size and arthritic changes.  Follow-up with me again in 8 to 10 weeks

## 2022-12-20 NOTE — Patient Instructions (Addendum)
Glad you're doing so well Consider the custom bracing Injection in knees today Happy Holidays See you again in 2 months

## 2023-01-12 ENCOUNTER — Other Ambulatory Visit: Payer: Self-pay | Admitting: Family Medicine

## 2023-01-15 LAB — HM DIABETES EYE EXAM

## 2023-02-08 ENCOUNTER — Other Ambulatory Visit: Payer: Self-pay | Admitting: Family Medicine

## 2023-02-09 ENCOUNTER — Other Ambulatory Visit: Payer: Self-pay | Admitting: Family Medicine

## 2023-02-19 NOTE — Progress Notes (Signed)
 Travis Claudene JENI Travis [name] [address] [place] [address] Phone: [phone] Subjective:   ISusannah [name], am serving as a scribe for Dr. Arthea Claudene.  I'm seeing this patient by the request  of:  Travis [name] BRAVO, MD  CC: bilateral knee pain   [name]  12/20/2022 Chronic problem with exacerbation again. Continues to respond somewhat to the injections after evaluation decided to do injections again. Arthritis is coming continue to have progression. Patient is ambulatory with an abnormal thigh to calf ratio and instability of the knees noted and so a custom OA brace could be beneficial but patient wants to consider it. Medication management we discussed with the Celebrex  200 mg twice a day as needed. Past medical history of patient's weight makes him not a surgical candidate until BMI is under 40. Social determinant of health includes being physically inactive secondary to patient's size and arthritic changes. Follow-up with me again in 8 to 10 weeks   Update 02/21/2023 Travis [name] is a 68 y.o. male coming in with complaint of B knee pain. Patient states knees in some discomfort. Wondering about gel injections today.  Patient states continues to have some discomfort.  Patient would like to go back to the gel injection if possible.  Continuing to attempt to lose weight and stay active.      Past Medical History:  Diagnosis Date   Appendicitis    Blood in stool    Coronary artery disease 1997   s/p PCI by Dr. Bulah   Diverticulosis of colon with hemorrhage 2009   Dyslipidemia, goal LDL below 70    Edema    Encounter for long-term (current) use of other medications    Essential hypertension, benign    Family history of thyroid  disease    Frequent unifocal PVCs    GBS (Guillain Barre syndrome) (HCC)    Morbid obesity (HCC)    Myalgia and myositis, unspecified    Prostate cancer (HCC) 2012   Pulmonary HTN (HCC) 08/14/2018   PASP by echo  2017   Sleep apnea with use of continuous positive airway pressure (CPAP)    Thrombocytopenia (HCC) 05/25/2014   Type II or unspecified type diabetes mellitus without mention of complication, not stated as uncontrolled    Past Surgical History:  Procedure Laterality Date   ANGIOPLASTY  1997   Dr. Bulah   APPENDECTOMY     CATARACT EXTRACTION Left 05/18/2020   CATARACT EXTRACTION Right 06/08/2020   radioactive seed prostate     Social History   Socioeconomic History   Marital status: Married    Spouse name: Not on file   Number of children: 0   Years of education: 12   Highest education level: 12th grade  Occupational History   Occupation: Building Surveyor: [employer]    Comment: mod/heavy lifting  Tobacco Use   Smoking status: Never   Smokeless tobacco: Never  Vaping Use   Vaping status: Never Used  Substance and Sexual Activity   Alcohol use: No   Drug use: No   Sexual activity: Not on file  Other Topics Concern   Not on file  Social History Narrative   Lives at home with his wife.   Right-handed.   Caffeine: occasional use.   Social Drivers of Corporate Investment Banker Strain: Low Risk  (02/20/2023)   Overall Financial Resource Strain (CARDIA)    Difficulty of Paying Living Expenses: Not very hard  Food Insecurity: No Food Insecurity (02/20/2023)   Hunger Vital Sign    Worried About Running Out of Food in the Last Year: Never true    Ran Out of Food in the Last Year: Never true  Transportation Needs: No Transportation Needs (02/20/2023)   PRAPARE - Administrator, Civil Service (Medical): No    Lack of Transportation (Non-Medical): No  Physical Activity: Insufficiently Active (02/20/2023)   Exercise Vital Sign    Days of Exercise per Week: 3 days    Minutes of Exercise per Session: 20 min  Stress: No Stress Concern Present (02/20/2023)   [employer] of Occupational Health - Occupational Stress Questionnaire    Feeling of Stress :  Only a little  Social Connections: Unknown (02/20/2023)   Social Connection and Isolation Panel [NHANES]    Frequency of Communication with Friends and Family: More than three times a week    Frequency of Social Gatherings with Friends and Family: Patient declined    Attends Religious Services: Patient declined    Database Administrator or Organizations: No    Attends Engineer, Structural: Never    Marital Status: Married   Allergies  Allergen Reactions   Crestor  [Rosuvastatin ]     Muscle Pain    Lipitor [Atorvastatin] Other (See Comments)    Arm weakness    Family History  Problem Relation Age of Onset   CVA Father    Hypertension Father    Diabetes Mother    Hypothyroidism Sister    Hypertension Sister     Current Outpatient Medications (Endocrine & Metabolic):    metFORMIN  (GLUCOPHAGE ) 500 MG tablet, TAKE 2 TABLETS BY MOUTH 2 TIMES DAILY WITH A MEAL.  Current Outpatient Medications (Cardiovascular):    amLODipine  (NORVASC ) 10 MG tablet, TAKE 1 TABLET BY MOUTH EVERY DAY   fenofibrate  160 MG tablet, TAKE 1 TABLET BY MOUTH EVERY DAY   furosemide  (LASIX ) 20 MG tablet, TAKE 1 TO 2 TABLETS BY MOUTH TWICE A DAY AS NEEDED FOR EDEMA   lisinopril  (ZESTRIL ) 40 MG tablet, TAKE 1 TABLET BY MOUTH EVERY DAY   metoprolol  succinate (TOPROL -XL) 100 MG 24 hr tablet, TAKE 1 TABLET (100 MG TOTAL) BY MOUTH DAILY. KEEP OV.   rosuvastatin  (CRESTOR ) 20 MG tablet, Take 1 tablet (20 mg total) by mouth daily.   tadalafil  (CIALIS ) 5 MG tablet, Take 1 tablet (5 mg total) by mouth daily.  Current Outpatient Medications (Respiratory):    albuterol  (VENTOLIN  HFA) 108 (90 Base) MCG/ACT inhaler, TAKE 2 PUFFS BY MOUTH EVERY 6 HOURS AS NEEDED FOR WHEEZE OR SHORTNESS OF BREATH  Current Outpatient Medications (Analgesics):    acetaminophen  (TYLENOL ) 500 MG tablet, Take 1,000 mg by mouth every 6 (six) hours as needed for moderate pain. Taking 650 mg arthritis BID   allopurinol  (ZYLOPRIM ) 100 MG  tablet, TAKE 2 TABLETS BY MOUTH EVERY DAY   aspirin  EC 81 MG tablet, Take 81 mg by mouth daily.   celecoxib  (CELEBREX ) 200 MG capsule, TAKE 1 CAPSULE BY MOUTH TWICE A DAY  Current Outpatient Medications (Hematological):    Cyanocobalamin (VITAMIN B-12 PO), Take 1,000 mcg by mouth.   Current Outpatient Medications (Other):    APPLE CIDER VINEGAR PO, Take 450 mg by mouth.   Ascorbic Acid (VITAMIN C) 500 MG CAPS, Take by mouth.   Blood Glucose Monitoring Suppl (FREESTYLE LITE) DEVI, Use to test sugars twice daily.Dx E11.9   buPROPion  (WELLBUTRIN  XL) 300 MG 24 hr tablet, TAKE 1 TABLET BY  MOUTH EVERY DAY   Cholecalciferol (VITAMIN D3 PO), Take 2,000 Units by mouth daily.    gabapentin  (NEURONTIN ) 300 MG capsule, TAKE 1 CAPSULE BY MOUTH THREE TIMES A DAY   glucosamine-chondroitin 500-400 MG tablet, Take 1 tablet by mouth 3 (three) times daily.   glucose blood (FREESTYLE LITE) test strip, Use to check blood glucose once daily (Dx: E 11.9 - type 2 DM without long term insulin  use)   Lancets (FREESTYLE) lancets, Use to check blood glucose once daily (Dx: E 11.9 - type 2 DM without long term insulin  use)   magnesium gluconate (MAGONATE) 500 MG tablet, Take 500 mg by mouth daily.    milk thistle 175 MG tablet, Take 175 mg by mouth daily.   Misc Natural Products (TART CHERRY ADVANCED PO), Take 1,200 mg by mouth daily.    Omega-3 Fatty Acids (SALMON OIL-1000 PO), Take by mouth.   Potassium 99 MG TABS, Take 99 mg by mouth.   traZODone  (DESYREL ) 100 MG tablet, TAKE 1 TABLET BY MOUTH EVERYDAY AT BEDTIME   Turmeric Curcumin 500 MG CAPS, Take by mouth.   zinc gluconate 50 MG tablet, Take 50 mg by mouth daily.   Reviewed prior external information including notes and imaging from  primary care provider As well as notes that were available from care everywhere and other healthcare systems.  Past medical history, social, surgical and family history all reviewed in electronic medical record.  No pertanent  information unless stated regarding to the chief complaint.   Review of Systems:  No headache, visual changes, nausea, vomiting, diarrhea, constipation, dizziness, abdominal pain, skin rash, fevers, chills, night sweats, weight loss, swollen lymph nodes, body aches, joint swelling, chest pain, shortness of breath, mood changes. POSITIVE muscle aches  Objective  Blood pressure 122/74, pulse 77, height 5' 7 (1.702 m), SpO2 95%.   General: No apparent distress alert and oriented x3 mood and affect normal, dressed appropriately.  HEENT: Pupils equal, extraocular movements intact  Respiratory: Patient's speak in full sentences and does not appear short of breath  Cardiovascular: No lower extremity edema, non tender, no erythema  Patient does have crepitus of the knees bilaterally.  Does have an effusion noted on the left knee.  Fairly significant.  Limited range of motion noted left greater than right.  Instability with valgus and varus force.  Abnormal thigh to calf ratio  After informed written and verbal consent, patient was seated on exam table. Right knee was prepped with alcohol swab and utilizing anterolateral approach, patient's right knee space was injected with 4:1  marcaine 0.5%: Kenalog 40mg /dL. Patient tolerated the procedure well without immediate complications.  After informed written and verbal consent, patient was seated on exam table. Left knee was prepped with alcohol swab and utilizing anterolateral approach, patient's left knee space was injected with 4:1  marcaine 0.5%: Kenalog 40mg /dL. Patient tolerated the procedure well without immediate complications.    Impression and Recommendations:     The above documentation has been reviewed and is accurate and complete Quinnley Colasurdo M Raniah Karan, DO

## 2023-02-21 ENCOUNTER — Encounter: Payer: Self-pay | Admitting: Family Medicine

## 2023-02-21 ENCOUNTER — Ambulatory Visit: Payer: Medicare Other | Admitting: Family Medicine

## 2023-02-21 VITALS — BP 122/74 | HR 77 | Ht 67.0 in

## 2023-02-21 DIAGNOSIS — M170 Bilateral primary osteoarthritis of knee: Secondary | ICD-10-CM | POA: Diagnosis not present

## 2023-02-21 NOTE — Patient Instructions (Addendum)
 Good to see you!  Injection in knees today Drained the L knee See you again in 8 weeks We will get gel approved

## 2023-02-22 ENCOUNTER — Telehealth: Payer: Self-pay

## 2023-02-22 ENCOUNTER — Ambulatory Visit: Payer: Medicare Other | Admitting: Family Medicine

## 2023-02-22 ENCOUNTER — Encounter: Payer: Self-pay | Admitting: Family Medicine

## 2023-02-22 VITALS — BP 118/64 | HR 68 | Temp 97.8°F | Ht 67.0 in | Wt 312.2 lb

## 2023-02-22 DIAGNOSIS — E119 Type 2 diabetes mellitus without complications: Secondary | ICD-10-CM | POA: Diagnosis not present

## 2023-02-22 DIAGNOSIS — Z7984 Long term (current) use of oral hypoglycemic drugs: Secondary | ICD-10-CM | POA: Diagnosis not present

## 2023-02-22 LAB — BASIC METABOLIC PANEL
BUN: 25 mg/dL — ABNORMAL HIGH (ref 6–23)
CO2: 23 meq/L (ref 19–32)
Calcium: 10.3 mg/dL (ref 8.4–10.5)
Chloride: 103 meq/L (ref 96–112)
Creatinine, Ser: 0.84 mg/dL (ref 0.40–1.50)
GFR: 90.74 mL/min (ref >60.00)
Glucose, Bld: 161 mg/dL — ABNORMAL HIGH (ref 70–99)
Potassium: 4.5 meq/L (ref 3.5–5.1)
Sodium: 140 meq/L (ref 135–145)

## 2023-02-22 LAB — HEMOGLOBIN A1C: Hgb A1c MFr Bld: 6.4 % (ref 4.6–6.5)

## 2023-02-22 MED ORDER — FENOFIBRATE 160 MG PO TABS: 160.0000 mg | ORAL_TABLET | Freq: Every day | ORAL | 1 refills | Status: DC

## 2023-02-22 MED ORDER — TADALAFIL 5 MG PO TABS: 5.0000 mg | ORAL_TABLET | Freq: Every day | ORAL | 2 refills | Status: DC

## 2023-02-22 MED ORDER — ROSUVASTATIN CALCIUM 20 MG PO TABS: 20.0000 mg | ORAL_TABLET | Freq: Every day | ORAL | 0 refills | Status: DC

## 2023-02-22 MED ORDER — METFORMIN HCL 500 MG PO TABS: ORAL_TABLET | ORAL | 1 refills | Status: DC

## 2023-02-22 NOTE — Progress Notes (Signed)
   Subjective:    Patient ID: Travis [name], male    DOB: [birth year], 68 y.o.   MRN: [id number]  HPI DM- chronic problem, on Metformin  1000mg  BID.  Last A1C 6.8%   UTD on foot exam, eye exam, microalbumin.  No CP, SOB, HA's, visual changes, abd pain, N/V.  No numbness/tingling of hands/feet above baseline.     Review of Systems For ROS see HPI     Objective:   Physical Exam Vitals reviewed.  Constitutional:      General: He is not in acute distress.    Appearance: Normal appearance. He is well-developed. He is obese. He is not ill-appearing.  HENT:     Head: Normocephalic and atraumatic.  Eyes:     Extraocular Movements: Extraocular movements intact.     Conjunctiva/sclera: Conjunctivae normal.     Pupils: Pupils are equal, round, and reactive to light.  Neck:     Thyroid : No thyromegaly.  Cardiovascular:     Rate and Rhythm: Normal rate and regular rhythm.     Pulses: Normal pulses.     Heart sounds: Normal heart sounds. No murmur heard. Pulmonary:     Effort: Pulmonary effort is normal. No respiratory distress.     Breath sounds: Normal breath sounds.  Abdominal:     General: Bowel sounds are normal. There is no distension.     Palpations: Abdomen is soft.  Musculoskeletal:     Cervical back: Normal range of motion and neck supple.     Right lower leg: No edema.     Left lower leg: No edema.  Lymphadenopathy:     Cervical: No cervical adenopathy.  Skin:    General: Skin is warm and dry.  Neurological:     General: No focal deficit present.     Mental Status: He is alert and oriented to person, place, and time.     Cranial Nerves: No cranial nerve deficit.  Psychiatric:        Mood and Affect: Mood normal.        Behavior: Behavior normal.           Assessment & Plan:

## 2023-02-22 NOTE — Telephone Encounter (Signed)
 Patient is scheduled for 04/18/23  MONOVISC authorized for bilateral knee Deductible applies  Since the the deductible has been met Patient is responsible for coinsurance Pa not required Secondary insurance This plan follows medicare guidelines It will pick up the remaining eligible expenses at 100% It does not cover medicare part B deductible  Document scanned

## 2023-02-22 NOTE — Assessment & Plan Note (Signed)
 Chronic problem.  Currently on Metformin  1000mg  BID w/o difficulty.  UTD on eye exam, foot exam, microalbumin.  Check labs.  Adjust meds prn

## 2023-02-22 NOTE — Patient Instructions (Signed)
 Follow up in 3-4 months We'll notify you of your lab results and make any changes if needed Continue to work on low carb diet and regular physical activity- you can do it! Call with any questions or concerns Stay Safe!  Stay Healthy! Have a great weekend!!

## 2023-02-22 NOTE — Telephone Encounter (Signed)
 Noted.

## 2023-02-24 ENCOUNTER — Encounter: Payer: Self-pay | Admitting: Family Medicine

## 2023-02-25 ENCOUNTER — Telehealth: Payer: Self-pay

## 2023-02-25 NOTE — Telephone Encounter (Signed)
 Pt has reviewed via MyChart

## 2023-02-25 NOTE — Telephone Encounter (Signed)
-----   Message from Laymon Priest sent at 02/24/2023  4:59 PM EST ----- Labs look great!  No changes at this time

## 2023-03-13 ENCOUNTER — Other Ambulatory Visit: Payer: Self-pay | Admitting: Family Medicine

## 2023-03-14 ENCOUNTER — Ambulatory Visit: Payer: Medicare Other | Admitting: Podiatry

## 2023-03-20 ENCOUNTER — Other Ambulatory Visit (HOSPITAL_COMMUNITY): Payer: Self-pay

## 2023-03-25 ENCOUNTER — Encounter: Payer: Self-pay | Admitting: Podiatry

## 2023-03-25 ENCOUNTER — Ambulatory Visit (INDEPENDENT_AMBULATORY_CARE_PROVIDER_SITE_OTHER): Payer: Medicare Other | Admitting: Podiatry

## 2023-03-25 DIAGNOSIS — E119 Type 2 diabetes mellitus without complications: Secondary | ICD-10-CM | POA: Diagnosis not present

## 2023-03-25 DIAGNOSIS — M79674 Pain in right toe(s): Secondary | ICD-10-CM

## 2023-03-25 DIAGNOSIS — G629 Polyneuropathy, unspecified: Secondary | ICD-10-CM | POA: Diagnosis not present

## 2023-03-25 DIAGNOSIS — M79675 Pain in left toe(s): Secondary | ICD-10-CM | POA: Diagnosis not present

## 2023-03-25 DIAGNOSIS — B351 Tinea unguium: Secondary | ICD-10-CM | POA: Diagnosis not present

## 2023-03-25 NOTE — Progress Notes (Signed)
 This patient returns to my office for at risk foot care.  This patient requires this care by a professional since this patient will be at risk due to having type 2 diabetes.  This patient is unable to cut nails himself since the patient cannot reach his nails.These nails are painful walking and wearing shoes.  This patient presents for at risk foot care today.  General Appearance  Alert, conversant and in no acute stress.  Vascular  Dorsalis pedis and posterior tibial  pulses are palpable  bilaterally.  Capillary return is within normal limits  bilaterally. Temperature is within normal limits  bilaterally.  Neurologic  Senn-Weinstein monofilament wire test within normal limits  bilaterally. Muscle power within normal limits bilaterally.  Nails Thick disfigured discolored nails with subungual debris  from hallux to fifth toes bilaterally. No evidence of bacterial infection or drainage bilaterally.  Orthopedic  No limitations of motion  feet .  No crepitus or effusions noted.  HAV  B/L.  Hammer toes  B/L.  Skin  normotropic skin with no porokeratosis noted bilaterally.  No signs of infections or ulcers noted.     Onychomycosis  Pain in right toes  Pain in left toes  Consent was obtained for treatment procedures.   Mechanical debridement of nails 1-5  bilaterally performed with a nail nipper.  Filed with dremel without incident.    Return office visit    3 months                  Told patient to return for periodic foot care and evaluation due to potential at risk complications.   Helane Gunther DPM

## 2023-03-27 ENCOUNTER — Encounter: Payer: Self-pay | Admitting: Family Medicine

## 2023-03-27 MED ORDER — LISINOPRIL 40 MG PO TABS: 40.0000 mg | ORAL_TABLET | Freq: Every day | ORAL | 1 refills | Status: DC

## 2023-04-16 NOTE — Progress Notes (Unsigned)
 [name] [address] [place] [address] Phone: [phone] Subjective:   INadine Counts, am serving as a scribe for Dr. Antoine Primas.  I'm seeing this patient by the request  of:  Sheliah Hatch, MD  CC: Bilateral knee pain  [id number]  02/21/2023  Travis [name] is a 68 y.o. male coming in with complaint of B knee pain. Injections last visit. Here for Monovisc today. Patient states continues to have pain on a daily basis.  Feels like there is and hears grinding in the knee seems to be right greater than left.  Some limited range of motion.      Past Medical History:  Diagnosis Date   Appendicitis    Blood in stool    Coronary artery disease 1997   s/p PCI by Dr. Aleen Campi   Diverticulosis of colon with hemorrhage 2009   Dyslipidemia, goal LDL below 70    Edema    Encounter for long-term (current) use of other medications    Essential hypertension, benign    Family history of thyroid disease    Frequent unifocal PVCs    GBS (Guillain Barre syndrome) (HCC)    Morbid obesity (HCC)    Myalgia and myositis, unspecified    Prostate cancer (HCC) 2012   Pulmonary HTN (HCC) 08/14/2018   PASP by echo 2017   Sleep apnea with use of continuous positive airway pressure (CPAP)    Thrombocytopenia (HCC) 05/25/2014   Type II or unspecified type diabetes mellitus without mention of complication, not stated as uncontrolled    Past Surgical History:  Procedure Laterality Date   ANGIOPLASTY  1997   Dr. 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   Right-handed.   Caffeine: occasional use.   Social Drivers of Corporate investment banker Strain: Low Risk  (02/20/2023)   Overall Financial Resource Strain (CARDIA)    Difficulty of Paying Living Expenses: Not very hard  Food Insecurity: No Food Insecurity (02/20/2023)   Hunger Vital Sign    Worried About Running Out of Food in the Last Year: Never true    Ran Out of Food in the Last Year: Never true  Transportation Needs: No Transportation Needs (02/20/2023)   PRAPARE - Administrator, Civil Service (Medical): No    Lack of Transportation (Non-Medical): No  Physical Activity: Insufficiently Active (02/20/2023)   Exercise Vital Sign    Days of Exercise per Week: 3 days    Minutes of Exercise per Session: 20 min  Stress: No Stress Concern Present (02/20/2023)   [employer] of Occupational Health - Occupational Stress Questionnaire  Feeling of Stress : Only a little  Social Connections: Unknown (02/20/2023)   Social Connection and Isolation Panel [NHANES]    Frequency of Communication with Friends and Family: More than three times a week    Frequency of Social Gatherings with Friends and Family: Patient declined    Attends Religious Services: Patient declined    Database administrator or Organizations: No    Attends Engineer, structural: Never    Marital Status: Married   Allergies  Allergen Reactions   Crestor [Rosuvastatin]     Muscle Pain    Lipitor [Atorvastatin] Other (See Comments)    Arm weakness    Family History  Problem Relation Age of Onset   CVA Father    Hypertension Father    Diabetes Mother    Hypothyroidism Sister    Hypertension Sister      Current Outpatient Medications (Endocrine & Metabolic):    metFORMIN (GLUCOPHAGE) 500 MG tablet, TAKE 2 TABLETS BY MOUTH 2 TIMES DAILY WITH A MEAL.  Current Outpatient Medications (Cardiovascular):    amLODipine (NORVASC) 10 MG tablet, TAKE 1 TABLET BY MOUTH EVERY DAY   fenofibrate 160 MG tablet, Take 1 tablet (160 mg total) by mouth daily.   furosemide (LASIX) 20 MG tablet, TAKE 1 TO 2 TABLETS BY MOUTH TWICE A DAY AS NEEDED FOR EDEMA   lisinopril (ZESTRIL) 40 MG tablet, Take 1 tablet (40 mg total) by mouth daily.   metoprolol succinate (TOPROL-XL) 100 MG 24 hr tablet, TAKE 1 TABLET (100 MG TOTAL) BY MOUTH DAILY. KEEP OV.   rosuvastatin (CRESTOR) 20 MG tablet, Take 1 tablet (20 mg total) by mouth daily.   tadalafil (CIALIS) 5 MG tablet, Take 1 tablet (5 mg total) by mouth daily.  Current Outpatient Medications (Respiratory):    albuterol (VENTOLIN HFA) 108 (90 Base) MCG/ACT inhaler, TAKE 2 PUFFS BY MOUTH EVERY 6 HOURS AS NEEDED FOR WHEEZE OR SHORTNESS OF BREATH  Current Outpatient Medications (Analgesics):    acetaminophen (TYLENOL) 500 MG tablet, Take 1,000 mg by mouth every 6 (six) hours as needed for moderate pain. Taking 650 mg arthritis BID   allopurinol (ZYLOPRIM) 100 MG tablet, TAKE 2 TABLETS BY MOUTH EVERY DAY   aspirin EC 81 MG tablet, Take 81 mg by mouth daily.   celecoxib (CELEBREX) 200 MG capsule, TAKE 1 CAPSULE BY MOUTH TWICE A DAY  Current Outpatient Medications (Hematological):    Cyanocobalamin (VITAMIN B-12 PO), Take 1,000 mcg by mouth.   Current Outpatient Medications (Other):    APPLE CIDER VINEGAR PO, Take 450 mg by mouth.   Ascorbic Acid (VITAMIN C) 500 MG CAPS, Take by mouth.   Blood Glucose Monitoring Suppl (FREESTYLE LITE) DEVI, Use to test sugars twice daily.Dx E11.9   buPROPion (WELLBUTRIN XL) 300 MG 24 hr tablet, TAKE 1 TABLET BY MOUTH EVERY DAY   Cholecalciferol (VITAMIN D3 PO), Take 2,000 Units by mouth daily.    FREESTYLE LITE test strip, USE TO CHECK  BLOOD GLUCOSE ONCE DAILY (DX: E 11.9 - TYPE 2 DM WITHOUT LONG TERM INSULIN USE)   gabapentin (NEURONTIN) 300 MG capsule, TAKE 1 CAPSULE BY MOUTH THREE TIMES A DAY   glucosamine-chondroitin 500-400 MG tablet, Take 1 tablet by mouth 3 (three) times daily.   Lancets (FREESTYLE) lancets, USE TO CHECK BLOOD GLUCOSE ONCE DAILY (DX: E 11.9 - TYPE 2 DM WITHOUT LONG TERM INSULIN USE)   magnesium gluconate (MAGONATE) 500 MG tablet, Take 500 mg by mouth daily.    milk thistle  175 MG tablet, Take 175 mg by mouth daily.   Misc Natural Products (TART CHERRY ADVANCED PO), Take 1,200 mg by mouth daily.    Omega-3 Fatty Acids (SALMON OIL-1000 PO), Take by mouth.   Potassium 99 MG TABS, Take 99 mg by mouth.   traZODone (DESYREL) 100 MG tablet, TAKE 1 TABLET BY MOUTH EVERYDAY AT BEDTIME   Turmeric Curcumin 500 MG CAPS, Take by mouth.   zinc gluconate 50 MG tablet, Take 50 mg by mouth daily.   Reviewed prior external information including notes and imaging from  primary care provider As well as notes that were available from care everywhere and other healthcare systems.  Past medical history, social, surgical and family history all reviewed in electronic medical record.  No pertanent information unless stated regarding to the chief complaint.   Review of Systems:  No headache, visual changes, nausea, vomiting, diarrhea, constipation, dizziness, abdominal pain, skin rash, fevers, chills, night sweats, weight loss, swollen lymph nodes, chest pain, shortness of breath, mood changes. POSITIVE muscle aches, joint swelling, body aches  Objective  Height 5\' 7"  (1.702 m).   General: No apparent distress alert and oriented x3 mood and affect normal, dressed appropriately.  HEENT: Pupils equal, extraocular movements intact  Respiratory: Patient's speak in full sentences and does not appear short of breath  Cardiovascular: No lower extremity edema, non tender, no erythema  Back exam does have some loss of lordosis.   Knee exam does have significant swelling noted bilaterally left greater than right.  No crepitus noted.  Instability noted with valgus and varus force  After informed written and verbal consent, patient was seated on exam table. Right knee was prepped with alcohol swab and utilizing anterolateral approach, patient's right knee space was injected with 48 mg per 3 mL of Monovisc (sodium hyaluronate) in a prefilled syringe was injected easily into the knee through a 22-gauge needle..Patient tolerated the procedure well without immediate complications.  After informed written and verbal consent, patient was seated on exam table. Left knee was prepped with alcohol swab and utilizing anterolateral approach, patient's left knee space was injected with 48 mg per 3 mL of Monovisc (sodium hyaluronate) in a prefilled syringe was injected easily into the knee through a 22-gauge needle..Patient tolerated the procedure well without immediate complications.     Impression and Recommendations:    The above documentation has been reviewed and is accurate and complete Judi Saa, DO

## 2023-04-17 ENCOUNTER — Other Ambulatory Visit (HOSPITAL_COMMUNITY): Payer: Self-pay

## 2023-04-17 ENCOUNTER — Telehealth: Payer: Self-pay

## 2023-04-17 NOTE — Telephone Encounter (Signed)
 Do you wish to try an alternative or should I just notify patient of denail

## 2023-04-17 NOTE — Telephone Encounter (Signed)
 Pharmacy Patient Advocate Encounter   Received notification from CoverMyMeds that prior authorization for Tadalafil 5MG  tablets is required/requested.   Insurance verification completed.   The patient is insured through [employer] .   Per test claim: PA required; PA submitted to above mentioned insurance via CoverMyMeds Key/confirmation #/EOC [id number] Status is pending

## 2023-04-17 NOTE — Telephone Encounter (Signed)
 I would let pt know that they rejected our attempt to get Tadalafil (Cialis) approved.  We can retry Viagra but that is really our only option right now other than a Urology referral (which we can place if he is interested)

## 2023-04-17 NOTE — Telephone Encounter (Signed)
 Pharmacy Patient Advocate Encounter  Received notification from [facility] that Prior Authorization for Tadalafil 5MG  tablets has been DENIED.  See denial reason below. No denial letter attached in CMM. Will attach denial letter to Media tab once received.   PA #/Case ID/Reference #:  [id number]

## 2023-04-17 NOTE — Telephone Encounter (Signed)
 LM to call back.

## 2023-04-18 ENCOUNTER — Encounter: Payer: Self-pay | Admitting: Family Medicine

## 2023-04-18 ENCOUNTER — Ambulatory Visit: Payer: Medicare Other | Admitting: Family Medicine

## 2023-04-18 VITALS — BP 124/82 | HR 71 | Ht 67.0 in | Wt 306.0 lb

## 2023-04-18 DIAGNOSIS — M170 Bilateral primary osteoarthritis of knee: Secondary | ICD-10-CM | POA: Diagnosis not present

## 2023-04-18 MED ORDER — HYALURONAN 88 MG/4ML IX SOSY
176.0000 mg | PREFILLED_SYRINGE | Freq: Once | INTRA_ARTICULAR | Status: AC
  Administered 2023-04-18: 176 mg via INTRA_ARTICULAR

## 2023-04-18 MED ORDER — SILDENAFIL CITRATE 100 MG PO TABS: 50.0000 mg | ORAL_TABLET | Freq: Every day | ORAL | 11 refills | Status: AC | PRN

## 2023-04-18 NOTE — Assessment & Plan Note (Signed)
 Chronic problem with exacerbation.  Patient is still doing well trying to lose weight as much as possible.  Discussed with the mother continue to work on it.  Patient is.  12 pounds today.  Pain, discussed home exercises, continue to use rolling walker with house secondary to these debility.  Would like to 40s.  If he did need any type of surgical intervention would be a candidate.  Follow-up with me again in months otherwise.  Social determinant health includes decrease physical activity secondary to the osteoarthritic changes.

## 2023-04-18 NOTE — Telephone Encounter (Signed)
 Called patient to let him know his medication has been sent into his pharmacy.

## 2023-04-18 NOTE — Telephone Encounter (Signed)
 Patient returned call and stated he is okay with trying Viagra again and if that doesn't work notes "we wont worry about it"

## 2023-04-18 NOTE — Addendum Note (Signed)
 Addended by: Sheliah Hatch on: 04/18/2023 08:36 AM   Modules accepted: Orders

## 2023-04-18 NOTE — Patient Instructions (Addendum)
 Good to see you! Gel injection today See you again in 3 months

## 2023-04-18 NOTE — Telephone Encounter (Signed)
Prescription for Viagra sent to pharmacy.

## 2023-05-03 ENCOUNTER — Other Ambulatory Visit: Payer: Self-pay | Admitting: Family Medicine

## 2023-05-09 ENCOUNTER — Other Ambulatory Visit: Payer: Self-pay | Admitting: Family Medicine

## 2023-05-23 ENCOUNTER — Encounter: Payer: Self-pay | Admitting: Family Medicine

## 2023-05-23 ENCOUNTER — Ambulatory Visit: Payer: Medicare Other | Admitting: Family Medicine

## 2023-05-23 VITALS — BP 118/64 | HR 80 | Temp 98.7°F | Ht 67.0 in | Wt 307.4 lb

## 2023-05-23 DIAGNOSIS — Z125 Encounter for screening for malignant neoplasm of prostate: Secondary | ICD-10-CM | POA: Diagnosis not present

## 2023-05-23 DIAGNOSIS — E785 Hyperlipidemia, unspecified: Secondary | ICD-10-CM | POA: Diagnosis not present

## 2023-05-23 DIAGNOSIS — E119 Type 2 diabetes mellitus without complications: Secondary | ICD-10-CM | POA: Diagnosis not present

## 2023-05-23 DIAGNOSIS — E1169 Type 2 diabetes mellitus with other specified complication: Secondary | ICD-10-CM | POA: Diagnosis not present

## 2023-05-23 DIAGNOSIS — I10 Essential (primary) hypertension: Secondary | ICD-10-CM

## 2023-05-23 DIAGNOSIS — Z7984 Long term (current) use of oral hypoglycemic drugs: Secondary | ICD-10-CM

## 2023-05-23 LAB — CBC WITH DIFFERENTIAL/PLATELET
Basophils Absolute: 0 10*3/uL (ref 0.0–0.1)
Basophils Relative: 0.5 % (ref 0.0–3.0)
Eosinophils Absolute: 0 10*3/uL (ref 0.0–0.7)
Eosinophils Relative: 0.2 % (ref 0.0–5.0)
HCT: 42.1 % (ref 39.0–52.0)
Hemoglobin: 14 g/dL (ref 13.0–17.0)
Lymphocytes Relative: 15.9 % (ref 12.0–46.0)
Lymphs Abs: 1.2 10*3/uL (ref 0.7–4.0)
MCHC: 33.2 g/dL (ref 30.0–36.0)
MCV: 94.5 fl (ref 78.0–100.0)
Monocytes Absolute: 0.5 10*3/uL (ref 0.1–1.0)
Monocytes Relative: 7 % (ref 3.0–12.0)
Neutro Abs: 5.7 10*3/uL (ref 1.4–7.7)
Neutrophils Relative %: 76.4 % (ref 43.0–77.0)
Platelets: 198 10*3/uL (ref 150.0–400.0)
RBC: 4.46 Mil/uL (ref 4.22–5.81)
RDW: 14 % (ref 11.5–15.5)
WBC: 7.4 10*3/uL (ref 4.0–10.5)

## 2023-05-23 LAB — HEPATIC FUNCTION PANEL
ALT: 24 U/L (ref 0–53)
AST: 26 U/L (ref 0–37)
Albumin: 4.4 g/dL (ref 3.5–5.2)
Alkaline Phosphatase: 55 U/L (ref 39–117)
Bilirubin, Direct: 0.1 mg/dL (ref 0.0–0.3)
Total Bilirubin: 0.4 mg/dL (ref 0.2–1.2)
Total Protein: 6.7 g/dL (ref 6.0–8.3)

## 2023-05-23 LAB — BASIC METABOLIC PANEL WITH GFR
BUN: 17 mg/dL (ref 6–23)
CO2: 26 meq/L (ref 19–32)
Calcium: 9.5 mg/dL (ref 8.4–10.5)
Chloride: 102 meq/L (ref 96–112)
Creatinine, Ser: 0.84 mg/dL (ref 0.40–1.50)
GFR: 90.58 mL/min (ref >60.00)
Glucose, Bld: 159 mg/dL — ABNORMAL HIGH (ref 70–99)
Potassium: 4.1 meq/L (ref 3.5–5.1)
Sodium: 139 meq/L (ref 135–145)

## 2023-05-23 LAB — LIPID PANEL
Cholesterol: 140 mg/dL (ref 0–200)
HDL: 31 mg/dL — ABNORMAL LOW (ref >39.00)
LDL Cholesterol: 31 mg/dL (ref 0–99)
NonHDL: 109.32
Total CHOL/HDL Ratio: 5
Triglycerides: 391 mg/dL — ABNORMAL HIGH (ref 0.0–149.0)
VLDL: 78.2 mg/dL — ABNORMAL HIGH (ref 0.0–40.0)

## 2023-05-23 LAB — MICROALBUMIN / CREATININE URINE RATIO
Creatinine,U: 160.5 mg/dL
Microalb Creat Ratio: 12.4 mg/g (ref 0.0–30.0)
Microalb, Ur: 2 mg/dL — ABNORMAL HIGH (ref 0.0–1.9)

## 2023-05-23 LAB — TSH: TSH: 1.76 u[IU]/mL (ref 0.35–5.50)

## 2023-05-23 LAB — PSA, MEDICARE: PSA: 0.01 ng/mL — ABNORMAL LOW (ref 0.10–4.00)

## 2023-05-23 LAB — HEMOGLOBIN A1C: Hgb A1c MFr Bld: 6.5 % (ref 4.6–6.5)

## 2023-05-23 MED ORDER — MELOXICAM 15 MG PO TABS
15.0000 mg | ORAL_TABLET | Freq: Every day | ORAL | 1 refills | Status: DC
Start: 2023-05-23 — End: 2023-11-11

## 2023-05-23 MED ORDER — BENAZEPRIL HCL 40 MG PO TABS: 40.0000 mg | ORAL_TABLET | Freq: Every day | ORAL | 3 refills | Status: AC

## 2023-05-23 NOTE — Patient Instructions (Signed)
 Follow up in 3-4 months to recheck diabetes We'll notify you of your lab results and make any changes if needed Continue to work on low carb/low sugar diet and get regular physical activity as you are able STOP the Lisinopril  START the Benazepril STOP the Celebrex  START the Meloxicam  for pain and inflammation Call with any questions or concerns Stay Safe!  Stay Healthy! Happy Early [place]!!

## 2023-05-23 NOTE — Progress Notes (Signed)
   Subjective:    Patient ID: Travis [name], male    DOB: 12/06/1955, 68 y.o.   MRN: [id number]  HPI HTN- chronic problem, on Amlodipine  10mg  daily, Lisinopril  40mg  daily, Metoprolol  XL 100mg  daily w/ good control.  Insurance is recommending he switch to Benazepril  40mg  daily for cost savings.  No CP, SOB, HA's, visual changes  DM- chronic problem, on Metformin  1000mg  BID.  On ACE for renal protection.  UTD on eye exam, foot exam.  Due for microalbumin.  Denies symptomatic lows.  Denies numbness/tingling of hands/feet.  Hyperlipidemia- chronic problem, on Crestor  20mg  daily, Fenofibrate  160mg  daily.  Denies abd pain, N/V.  Morbid obesity- weight is stable, 307 lbs.  BMI 48.   Review of Systems For ROS see HPI     Objective:   Physical Exam Vitals reviewed.  Constitutional:      General: He is not in acute distress.    Appearance: Normal appearance. He is well-developed. He is obese. He is not ill-appearing.  HENT:     Head: Normocephalic and atraumatic.  Eyes:     Extraocular Movements: Extraocular movements intact.     Conjunctiva/sclera: Conjunctivae normal.     Pupils: Pupils are equal, round, and reactive to light.  Neck:     Thyroid : No thyromegaly.  Cardiovascular:     Rate and Rhythm: Normal rate and regular rhythm.     Pulses: Normal pulses.     Heart sounds: Normal heart sounds. No murmur heard. Pulmonary:     Effort: Pulmonary effort is normal. No respiratory distress.     Breath sounds: Normal breath sounds.  Abdominal:     General: Bowel sounds are normal. There is no distension.     Palpations: Abdomen is soft.  Musculoskeletal:     Cervical back: Normal range of motion and neck supple.     Right lower leg: No edema.     Left lower leg: No edema.  Lymphadenopathy:     Cervical: No cervical adenopathy.  Skin:    General: Skin is warm and dry.  Neurological:     General: No focal deficit present.     Mental Status: He is alert and oriented to person,  place, and time.     Cranial Nerves: No cranial nerve deficit.  Psychiatric:        Mood and Affect: Mood normal.        Behavior: Behavior normal.           Assessment & Plan:

## 2023-05-24 ENCOUNTER — Telehealth: Payer: Self-pay

## 2023-05-24 NOTE — Telephone Encounter (Signed)
-----   Message from Laymon Priest sent at 05/23/2023  5:55 PM EDT ----- Labs are stable and overall look good.  No changes at this time

## 2023-05-24 NOTE — Assessment & Plan Note (Signed)
Chronic problem.  On Crestor and Fenofibrate daily w/o difficulty.  Check labs.  Adjust meds prn

## 2023-05-24 NOTE — Assessment & Plan Note (Signed)
 Chronic problem.  On Amlodipine , Lisinopril , and Metoprolol  w/ good control.  Insurance is telling him to switch to Benazepril  to save money- which makes no sense as they are both generic.  Medication changed to Benazepril  40mg  daily.  Currently asymptomatic.  Will follow.

## 2023-05-24 NOTE — Assessment & Plan Note (Signed)
 Chronic problem.  On Metformin  1000mg  BID w/o difficulty.  UTD on eye exam, foot exam.  Microalbumin ordered.  On ACE for renal protection.  Currently asymptomatic.  Check labs.  Adjust meds prn

## 2023-05-24 NOTE — Telephone Encounter (Signed)
 Pt has reviewed labs via MyChart

## 2023-05-24 NOTE — Assessment & Plan Note (Signed)
 Ongoing issue for pt.  Weight is stable at 307.  BMI 48.  He is not getting regular exercise due to ongoing joint issues.  Stressed need for healthy diet.  Will follow.

## 2023-06-28 ENCOUNTER — Ambulatory Visit (INDEPENDENT_AMBULATORY_CARE_PROVIDER_SITE_OTHER): Admitting: Podiatry

## 2023-06-28 ENCOUNTER — Encounter: Payer: Self-pay | Admitting: Podiatry

## 2023-06-28 DIAGNOSIS — B351 Tinea unguium: Secondary | ICD-10-CM

## 2023-06-28 DIAGNOSIS — E119 Type 2 diabetes mellitus without complications: Secondary | ICD-10-CM

## 2023-06-28 DIAGNOSIS — G629 Polyneuropathy, unspecified: Secondary | ICD-10-CM

## 2023-06-28 DIAGNOSIS — M79674 Pain in right toe(s): Secondary | ICD-10-CM

## 2023-06-28 DIAGNOSIS — M79675 Pain in left toe(s): Secondary | ICD-10-CM

## 2023-06-28 NOTE — Progress Notes (Signed)
 This patient returns to my office for at risk foot care.  This patient requires this care by a professional since this patient will be at risk due to having type 2 diabetes.  This patient is unable to cut nails himself since the patient cannot reach his nails.These nails are painful walking and wearing shoes.  This patient presents for at risk foot care today.  General Appearance  Alert, conversant and in no acute stress.  Vascular  Dorsalis pedis and posterior tibial  pulses are palpable  bilaterally.  Capillary return is within normal limits  bilaterally. Temperature is within normal limits  bilaterally.  Neurologic  Senn-Weinstein monofilament wire test within normal limits  bilaterally. Muscle power within normal limits bilaterally.  Nails Thick disfigured discolored nails with subungual debris  from hallux to fifth toes bilaterally. No evidence of bacterial infection or drainage bilaterally.  Orthopedic  No limitations of motion  feet .  No crepitus or effusions noted.  HAV  B/L.  Hammer toes  B/L.  Skin  normotropic skin with no porokeratosis noted bilaterally.  No signs of infections or ulcers noted.     Onychomycosis  Pain in right toes  Pain in left toes  Consent was obtained for treatment procedures.   Mechanical debridement of nails 1-5  bilaterally performed with a nail nipper.  Filed with dremel without incident.    Return office visit    3 months                  Told patient to return for periodic foot care and evaluation due to potential at risk complications.   Helane Gunther DPM

## 2023-07-15 NOTE — Progress Notes (Unsigned)
 [name] [address] [place] [address] Phone: [phone] Subjective:   Travis [name], am serving as a scribe for Dr. Arthea [name].  I'm seeing this patient by the request  of:  Travis Comer BRAVO, MD  CC: Bilateral knee pain  [name]  04/18/2023 Chronic problem with exacerbation.  Patient is still doing well trying to lose weight as much as possible.  Discussed with the mother continue to work on it.  Patient is.  12 pounds today.  Pain, discussed home exercises, continue to use rolling walker with house secondary to these debility.  Would like to 40s.  If he did need any type of surgical intervention would be a candidate.  Follow-up with me again in months otherwise.  Social determinant health includes decrease physical activity secondary to the osteoarthritic changes.      Update 07/22/2023 Travis [name] is a 68 y.o. male coming in with complaint of B knee pain. Patient states that he is packing and his knees are sore. Also painful due to rain.     Past Medical History:  Diagnosis Date   Appendicitis    Blood in stool    Coronary artery disease 1997   s/p PCI by Dr. Bulah   Diverticulosis of colon with hemorrhage 2009   Dyslipidemia, goal LDL below 70    Edema    Encounter for long-term (current) use of other medications    Essential hypertension, benign    Family history of thyroid  disease    Frequent unifocal PVCs    GBS (Guillain Barre syndrome) (HCC)    Morbid obesity (HCC)    Myalgia and myositis, unspecified    Prostate cancer (HCC) 2012   Pulmonary HTN (HCC) 08/14/2018   PASP by echo 2017   Sleep apnea with use of continuous positive airway pressure (CPAP)    Thrombocytopenia (HCC) 05/25/2014   Type II or unspecified type diabetes mellitus without mention of complication, not stated as uncontrolled    Past Surgical History:  Procedure Laterality Date   ANGIOPLASTY  1997   Dr. Bulah   APPENDECTOMY      CATARACT EXTRACTION Left 05/18/2020   CATARACT EXTRACTION Right 06/08/2020   radioactive seed prostate     Social History   Socioeconomic History   Marital status: Married    Spouse name: Not on file   Number of children: 0   Years of education: 12   Highest education level: 12th grade  Occupational History   Occupation: Building surveyor: [employer]    Comment: mod/heavy lifting  Tobacco Use   Smoking status: Never   Smokeless tobacco: Never  Vaping Use   Vaping status: Never Used  Substance and Sexual Activity   Alcohol use: No   Drug use: No   Sexual activity: Not on file  Other Topics Concern   Not on file  Social History Narrative   Lives at home with his wife.   Right-handed.   Caffeine: occasional use.   Social Drivers of Corporate investment banker Strain: Low Risk  (02/20/2023)   Overall Financial Resource Strain (CARDIA)    Difficulty of Paying Living Expenses: Not very hard  Food Insecurity: No Food Insecurity (02/20/2023)   Hunger Vital Sign    Worried About Running Out of Food in the Last Year: Never true    Ran Out of Food in the Last Year: Never true  Transportation Needs: No Transportation Needs (02/20/2023)  PRAPARE - Administrator, Civil Service (Medical): No    Lack of Transportation (Non-Medical): No  Physical Activity: Insufficiently Active (02/20/2023)   Exercise Vital Sign    Days of Exercise per Week: 3 days    Minutes of Exercise per Session: 20 min  Stress: No Stress Concern Present (02/20/2023)   Travis [name] of Occupational Health - Occupational Stress Questionnaire    Feeling of Stress : Only a little  Social Connections: Unknown (02/20/2023)   Social Connection and Isolation Panel    Frequency of Communication with Friends and Family: More than three times a week    Frequency of Social Gatherings with Friends and Family: Patient declined    Attends Religious Services: Patient declined    Doctor, general practice or Organizations: No    Attends Engineer, structural: Not on file    Marital Status: Married   Allergies  Allergen Reactions   Crestor  [Rosuvastatin ]     Muscle Pain    Lipitor [Atorvastatin] Other (See Comments)    Arm weakness    Family History  Problem Relation Age of Onset   CVA Father    Hypertension Father    Diabetes Mother    Hypothyroidism Sister    Hypertension Sister     Current Outpatient Medications (Endocrine & Metabolic):    metFORMIN  (GLUCOPHAGE ) 500 MG tablet, TAKE 2 TABLETS BY MOUTH 2 TIMES DAILY WITH A MEAL.  Current Outpatient Medications (Cardiovascular):    amLODipine  (NORVASC ) 10 MG tablet, TAKE 1 TABLET BY MOUTH EVERY DAY   benazepril  (LOTENSIN ) 40 MG tablet, Take 1 tablet (40 mg total) by mouth daily.   fenofibrate  160 MG tablet, Take 1 tablet (160 mg total) by mouth daily.   furosemide  (LASIX ) 20 MG tablet, TAKE 1 TO 2 TABLETS BY MOUTH TWICE A DAY AS NEEDED FOR EDEMA   metoprolol  succinate (TOPROL -XL) 100 MG 24 hr tablet, TAKE 1 TABLET (100 MG TOTAL) BY MOUTH DAILY. KEEP OV.   rosuvastatin  (CRESTOR ) 20 MG tablet, TAKE 1 TABLET BY MOUTH EVERY DAY   sildenafil  (VIAGRA ) 100 MG tablet, Take 0.5-1 tablets (50-100 mg total) by mouth daily as needed for erectile dysfunction.   tadalafil  (CIALIS ) 5 MG tablet, Take 1 tablet (5 mg total) by mouth daily.  Current Outpatient Medications (Respiratory):    albuterol  (VENTOLIN  HFA) 108 (90 Base) MCG/ACT inhaler, TAKE 2 PUFFS BY MOUTH EVERY 6 HOURS AS NEEDED FOR WHEEZE OR SHORTNESS OF BREATH  Current Outpatient Medications (Analgesics):    acetaminophen  (TYLENOL ) 500 MG tablet, Take 1,000 mg by mouth every 6 (six) hours as needed for moderate pain. Taking 650 mg arthritis BID   allopurinol  (ZYLOPRIM ) 100 MG tablet, TAKE 2 TABLETS BY MOUTH EVERY DAY   aspirin  EC 81 MG tablet, Take 81 mg by mouth daily.   meloxicam  (MOBIC ) 15 MG tablet, Take 1 tablet (15 mg total) by mouth daily.  Current Outpatient  Medications (Hematological):    Cyanocobalamin (VITAMIN B-12 PO), Take 1,000 mcg by mouth.   Current Outpatient Medications (Other):    APPLE CIDER VINEGAR PO, Take 450 mg by mouth.   Ascorbic Acid (VITAMIN C) 500 MG CAPS, Take by mouth.   Blood Glucose Monitoring Suppl (FREESTYLE LITE) DEVI, Use to test sugars twice daily.Dx E11.9   buPROPion  (WELLBUTRIN  XL) 300 MG 24 hr tablet, TAKE 1 TABLET BY MOUTH EVERY DAY   Cholecalciferol (VITAMIN D3 PO), Take 2,000 Units by mouth daily.    FREESTYLE LITE test strip, USE  TO CHECK BLOOD GLUCOSE ONCE DAILY (DX: E 11.9 - TYPE 2 DM WITHOUT LONG TERM INSULIN  USE)   gabapentin  (NEURONTIN ) 300 MG capsule, TAKE 1 CAPSULE BY MOUTH THREE TIMES A DAY   glucosamine-chondroitin 500-400 MG tablet, Take 1 tablet by mouth 3 (three) times daily.   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    milk thistle 175 MG tablet, Take 175 mg by mouth daily.   Misc Natural Products (TART CHERRY ADVANCED PO), Take 1,200 mg by mouth daily.    Omega-3 Fatty Acids (SALMON OIL-1000 PO), Take by mouth.   Potassium 99 MG TABS, Take 99 mg by mouth.   traZODone  (DESYREL ) 100 MG tablet, TAKE 1 TABLET BY MOUTH EVERYDAY AT BEDTIME   Turmeric Curcumin 500 MG CAPS, Take by mouth.   zinc gluconate 50 MG tablet, Take 50 mg by mouth daily.   Reviewed prior external information including notes and imaging from  primary care provider As well as notes that were available from care everywhere and other healthcare systems.  Past medical history, social, surgical and family history all reviewed in electronic medical record.  No pertanent information unless stated regarding to the chief complaint.   Review of Systems:  No headache, visual changes, nausea, vomiting, diarrhea, constipation, dizziness, abdominal pain, skin rash, fevers, chills,  night sweats, weight loss, swollen lymph nodes, body aches, joint swelling, chest pain, shortness of breath, mood changes. POSITIVE muscle aches  Objective  There were no vitals taken for this visit.   General: No apparent distress alert and oriented x3 mood and affect normal, dressed appropriately.  HEENT: Pupils equal, extraocular movements intact  Respiratory: Patient's speak in full sentences and does not appear short of breath  Cardiovascular: No lower extremity edema, non tender, no erythema  Bilateral knee exam shows as noted.  Still has not lost any obese.  No effusion noted of the left knee.  Ambulating with the aid of a rollator.   After informed written and verbal consent, patient was seated on exam table. Right knee was prepped with alcohol swab and utilizing anterolateral approach, patient's right knee space was injected with 4:1  marcaine 0.5%: Kenalog 40mg /dL. Patient tolerated the procedure well without immediate complications.  After informed written and verbal consent, patient was seated on exam table. Left knee was prepped with alcohol swab and utilizing anterolateral approach, patient's left knee space was injected with 4:1  marcaine 0.5%: Kenalog 40mg /dL. Patient tolerated the procedure well without immediate complications.   Impression and Recommendations:     The above documentation has been reviewed and is accurate and complete Ermias Tomeo M Chloris Marcoux, DO

## 2023-07-22 ENCOUNTER — Encounter: Payer: Self-pay | Admitting: Family Medicine

## 2023-07-22 ENCOUNTER — Ambulatory Visit (INDEPENDENT_AMBULATORY_CARE_PROVIDER_SITE_OTHER): Admitting: Family Medicine

## 2023-07-22 VITALS — BP 122/82 | HR 70 | Ht 67.0 in | Wt 304.0 lb

## 2023-07-22 DIAGNOSIS — M170 Bilateral primary osteoarthritis of knee: Secondary | ICD-10-CM | POA: Diagnosis not present

## 2023-07-22 NOTE — Assessment & Plan Note (Addendum)
 Injection given, discussed icing regimen and home exercises, discussed which activities to do in which ones to avoid. Needs to get MRi less then 40 for surgical intervention  Follow-up again in 12 weeks.

## 2023-07-22 NOTE — Patient Instructions (Addendum)
 Injected both knees today Keep working on [employer] Good luck with the move See me again in 3 months

## 2023-08-04 ENCOUNTER — Other Ambulatory Visit: Payer: Self-pay | Admitting: Family Medicine

## 2023-08-04 ENCOUNTER — Other Ambulatory Visit: Payer: Self-pay | Admitting: General Practice

## 2023-08-17 ENCOUNTER — Other Ambulatory Visit: Payer: Self-pay | Admitting: Family Medicine

## 2023-08-28 ENCOUNTER — Other Ambulatory Visit: Payer: Self-pay | Admitting: General Practice

## 2023-09-03 ENCOUNTER — Other Ambulatory Visit: Payer: Self-pay | Admitting: Family Medicine

## 2023-09-04 ENCOUNTER — Other Ambulatory Visit: Payer: Self-pay | Admitting: Family Medicine

## 2023-09-11 ENCOUNTER — Other Ambulatory Visit: Payer: Self-pay | Admitting: General Practice

## 2023-09-18 ENCOUNTER — Ambulatory Visit: Admitting: *Deleted

## 2023-09-18 VITALS — Ht 68.0 in | Wt 304.0 lb

## 2023-09-18 DIAGNOSIS — Z0000 Encounter for general adult medical examination without abnormal findings: Secondary | ICD-10-CM | POA: Diagnosis not present

## 2023-09-18 NOTE — Patient Instructions (Signed)
 Mr. Travis [name] , Thank you for taking time to come for your Medicare Wellness Visit. I appreciate your ongoing commitment to your health goals. Please review the following plan we discussed and let me know if I can assist you in the future.   Screening recommendations/referrals: Colonoscopy: up to date Recommended yearly ophthalmology/optometry visit for glaucoma screening and checkup Recommended yearly dental visit for hygiene and checkup  Vaccinations: Influenza vaccine:  Pneumococcal vaccine:  Tdap vaccine:  Shingles vaccine:      Preventive Care 65 Years and Older, Male Preventive care refers to lifestyle choices and visits with your health care provider that can promote health and wellness. What does preventive care include? A yearly physical exam. This is also called an annual well check. Dental exams once or twice a year. Routine eye exams. Ask your health care provider how often you should have your eyes checked. Personal lifestyle choices, including: Daily care of your teeth and gums. Regular physical activity. Eating a healthy diet. Avoiding tobacco and drug use. Limiting alcohol use. Practicing safe sex. Taking low doses of aspirin  every day. Taking vitamin and mineral supplements as recommended by your health care provider. What happens during an annual well check? The services and screenings done by your health care provider during your annual well check will depend on your age, overall health, lifestyle risk factors, and family history of disease. Counseling  Your health care provider may ask you questions about your: Alcohol use. Tobacco use. Drug use. Emotional well-being. Home and relationship well-being. Sexual activity. Eating habits. History of falls. Memory and ability to understand (cognition). Work and work Astronomer. Screening  You may have the following tests or measurements: Height, weight, and BMI. Blood pressure. Lipid and cholesterol levels.  These may be checked every 5 years, or more frequently if you are over 75 years old. Skin check. Lung cancer screening. You may have this screening every year starting at age 85 if you have a 30-pack-year history of smoking and currently smoke or have quit within the past 15 years. Fecal occult blood test (FOBT) of the stool. You may have this test every year starting at age 60. Flexible sigmoidoscopy or colonoscopy. You may have a sigmoidoscopy every 5 years or a colonoscopy every 10 years starting at age 23. Prostate cancer screening. Recommendations will vary depending on your family history and other risks. Hepatitis C blood test. Hepatitis B blood test. Sexually transmitted disease (STD) testing. Diabetes screening. This is done by checking your blood sugar (glucose) after you have not eaten for a while (fasting). You may have this done every 1-3 years. Abdominal aortic aneurysm (AAA) screening. You may need this if you are a current or former smoker. Osteoporosis. You may be screened starting at age 14 if you are at high risk. Talk with your health care provider about your test results, treatment options, and if necessary, the need for more tests. Vaccines  Your health care provider may recommend certain vaccines, such as: Influenza vaccine. This is recommended every year. Tetanus, diphtheria, and acellular pertussis (Tdap, Td) vaccine. You may need a Td booster every 10 years. Zoster vaccine. You may need this after age 60. Pneumococcal 13-valent conjugate (PCV13) vaccine. One dose is recommended after age 32. Pneumococcal polysaccharide (PPSV23) vaccine. One dose is recommended after age 15. Talk to your health care provider about which screenings and vaccines you need and how often you need them. This information is not intended to replace advice given to you by your health  care provider. Make sure you discuss any questions you have with your health care provider. Document Released:  01/28/2015 Document Revised: 09/21/2015 Document Reviewed: 11/02/2014 Elsevier Interactive Patient Education  2017 [employer].  Fall Prevention in the Home Falls can cause injuries. They can happen to people of all ages. There are many things you can do to make your home safe and to help prevent falls. What can I do on the outside of my home? Regularly fix the edges of walkways and driveways and fix any cracks. Remove anything that might make you trip as you walk through a door, such as a raised step or threshold. Trim any bushes or trees on the path to your home. Use bright outdoor lighting. Clear any walking paths of anything that might make someone trip, such as rocks or tools. Regularly check to see if handrails are loose or broken. Make sure that both sides of any steps have handrails. Any raised decks and porches should have guardrails on the edges. Have any leaves, snow, or ice cleared regularly. Use sand or salt on walking paths during winter. Clean up any spills in your garage right away. This includes oil or grease spills. What can I do in the bathroom? Use night lights. Install grab bars by the toilet and in the tub and shower. Do not use towel bars as grab bars. Use non-skid mats or decals in the tub or shower. If you need to sit down in the shower, use a plastic, non-slip stool. Keep the floor dry. Clean up any water that spills on the floor as soon as it happens. Remove soap buildup in the tub or shower regularly. Attach bath mats securely with double-sided non-slip rug tape. Do not have throw rugs and other things on the floor that can make you trip. What can I do in the bedroom? Use night lights. Make sure that you have a light by your bed that is easy to reach. Do not use any sheets or blankets that are too big for your bed. They should not hang down onto the floor. Have a firm chair that has side arms. You can use this for support while you get dressed. Do not have  throw rugs and other things on the floor that can make you trip. What can I do in the kitchen? Clean up any spills right away. Avoid walking on wet floors. Keep items that you use a lot in easy-to-reach places. If you need to reach something above you, use a strong step stool that has a grab bar. Keep electrical cords out of the way. Do not use floor polish or wax that makes floors slippery. If you must use wax, use non-skid floor wax. Do not have throw rugs and other things on the floor that can make you trip. What can I do with my stairs? Do not leave any items on the stairs. Make sure that there are handrails on both sides of the stairs and use them. Fix handrails that are broken or loose. Make sure that handrails are as long as the stairways. Check any carpeting to make sure that it is firmly attached to the stairs. Fix any carpet that is loose or worn. Avoid having throw rugs at the top or bottom of the stairs. If you do have throw rugs, attach them to the floor with carpet tape. Make sure that you have a light switch at the top of the stairs and the bottom of the stairs. If you do not  have them, ask someone to add them for you. What else can I do to help prevent falls? Wear shoes that: Do not have high heels. Have rubber bottoms. Are comfortable and fit you well. Are closed at the toe. Do not wear sandals. If you use a stepladder: Make sure that it is fully opened. Do not climb a closed stepladder. Make sure that both sides of the stepladder are locked into place. Ask someone to hold it for you, if possible. Clearly mark and make sure that you can see: Any grab bars or handrails. First and last steps. Where the edge of each step is. Use tools that help you move around (mobility aids) if they are needed. These include: Canes. Walkers. Scooters. Crutches. Turn on the lights when you go into a dark area. Replace any light bulbs as soon as they burn out. Set up your furniture so  you have a clear path. Avoid moving your furniture around. If any of your floors are uneven, fix them. If there are any pets around you, be aware of where they are. Review your medicines with your doctor. Some medicines can make you feel dizzy. This can increase your chance of falling. Ask your doctor what other things that you can do to help prevent falls. This information is not intended to replace advice given to you by your health care provider. Make sure you discuss any questions you have with your health care provider. Document Released: 10/28/2008 Document Revised: 06/09/2015 Document Reviewed: 02/05/2014 Elsevier Interactive Patient Education  2017 [employer].

## 2023-09-18 NOTE — Progress Notes (Signed)
 Subjective:   Travis [name] is a 68 y.o. male who presents for Medicare Annual/Subsequent preventive examination.  Visit Complete: Virtual I connected with  Travis [name] on 09/18/23 by a audio enabled telemedicine application and verified that I am speaking with the correct person using two identifiers.  Patient Location: Home  Provider Location: Home Office  I discussed the limitations of evaluation and management by telemedicine. The patient expressed understanding and agreed to proceed.  Vital Signs: Because this visit was a virtual/telehealth visit, some criteria may be missing or patient reported. Any vitals not documented were not able to be obtained and vitals that have been documented are patient reported.  Patient Medicare AWV questionnaire was completed by the patient on 09-17-2023; I have confirmed that all information answered by patient is correct and no changes since this date.         Objective:    Today's Vitals   09/18/23 0819  Weight: (!) 304 lb (137.9 kg)  Height: 5' 8 (1.727 m)   Body mass index is 46.22 kg/m.     09/18/2023    8:15 AM 07/02/2022    4:21 PM 05/06/2019    8:13 AM 05/04/2019   10:26 AM 04/17/2019    4:00 AM 04/16/2019    5:29 AM 10/14/2014    9:07 PM  Advanced Directives  Does Patient Have a Medical Advance Directive? No No No No No No No   Would patient like information on creating a medical advance directive? No - Patient declined  No - Patient declined No - Patient declined No - Patient declined No - Patient declined No - patient declined information      Data saved with a previous flowsheet row definition    Current Medications (verified) Outpatient Encounter Medications as of 09/18/2023  Medication Sig   acetaminophen  (TYLENOL ) 500 MG tablet Take 1,000 mg by mouth every 6 (six) hours as needed for moderate pain. Taking 650 mg arthritis BID   albuterol  (VENTOLIN  HFA) 108 (90 Base) MCG/ACT inhaler TAKE 2 PUFFS BY MOUTH EVERY 6 HOURS AS  NEEDED FOR WHEEZE OR SHORTNESS OF BREATH   allopurinol  (ZYLOPRIM ) 100 MG tablet TAKE 2 TABLETS BY MOUTH EVERY DAY   amLODipine  (NORVASC ) 10 MG tablet TAKE 1 TABLET BY MOUTH EVERY DAY   Ascorbic Acid (VITAMIN C) 500 MG CAPS Take by mouth.   aspirin  EC 81 MG tablet Take 81 mg by mouth daily.   benazepril  (LOTENSIN ) 40 MG tablet Take 1 tablet (40 mg total) by mouth daily.   buPROPion  (WELLBUTRIN  XL) 300 MG 24 hr tablet TAKE 1 TABLET BY MOUTH EVERY DAY   Cholecalciferol (VITAMIN D3 PO) Take 2,000 Units by mouth daily.    Cyanocobalamin (VITAMIN B-12 PO) Take 1,000 mcg by mouth.    fenofibrate  160 MG tablet TAKE 1 TABLET BY MOUTH EVERY DAY   furosemide  (LASIX ) 20 MG tablet TAKE 1 TO 2 TABLETS BY MOUTH TWICE A DAY AS NEEDED FOR EDEMA   gabapentin  (NEURONTIN ) 300 MG capsule TAKE 1 CAPSULE BY MOUTH THREE TIMES A DAY   magnesium gluconate (MAGONATE) 500 MG tablet Take 500 mg by mouth daily.    meloxicam  (MOBIC ) 15 MG tablet Take 1 tablet (15 mg total) by mouth daily.   metFORMIN  (GLUCOPHAGE ) 500 MG tablet TAKE 2 TABLETS BY MOUTH 2 TIMES DAILY WITH A MEAL.   metoprolol  succinate (TOPROL -XL) 100 MG 24 hr tablet TAKE 1 TABLET (100 MG TOTAL) BY MOUTH DAILY MAKE APPOINTMENT FOR REFILL  Misc Natural Products (TART CHERRY ADVANCED PO) Take 1,200 mg by mouth daily.    Omega-3 Fatty Acids (SALMON OIL-1000 PO) Take by mouth.   Potassium 99 MG TABS Take 99 mg by mouth.   rosuvastatin  (CRESTOR ) 20 MG tablet TAKE 1 TABLET BY MOUTH EVERY DAY   sildenafil  (VIAGRA ) 100 MG tablet Take 0.5-1 tablets (50-100 mg total) by mouth daily as needed for erectile dysfunction.   traZODone  (DESYREL ) 100 MG tablet TAKE 1 TABLET BY MOUTH EVERYDAY AT BEDTIME   zinc gluconate 50 MG tablet Take 50 mg by mouth daily.   APPLE CIDER VINEGAR PO Take 450 mg by mouth.   Blood Glucose Monitoring Suppl (FREESTYLE LITE) DEVI Use to test sugars twice daily.Dx E11.9   FREESTYLE LITE test strip USE TO CHECK BLOOD GLUCOSE ONCE DAILY (DX: E 11.9  - TYPE 2 DM WITHOUT LONG TERM INSULIN  USE)   glucosamine-chondroitin 500-400 MG tablet Take 1 tablet by mouth 3 (three) times daily.   Lancets (FREESTYLE) lancets USE TO CHECK BLOOD GLUCOSE ONCE DAILY (DX: E 11.9 - TYPE 2 DM WITHOUT LONG TERM INSULIN  USE)   milk thistle 175 MG tablet Take 175 mg by mouth daily.   tadalafil  (CIALIS ) 5 MG tablet Take 1 tablet (5 mg total) by mouth daily.   Turmeric Curcumin 500 MG CAPS Take by mouth.   No facility-administered encounter medications on file as of 09/18/2023.    Allergies (verified) Crestor  [rosuvastatin ] and Lipitor [atorvastatin]   History: Past Medical History:  Diagnosis Date   Appendicitis    Blood in stool    Coronary artery disease 1997   s/p PCI by Dr. Bulah   Diverticulosis of colon with hemorrhage 2009   Dyslipidemia, goal LDL below 70    Edema    Encounter for long-term (current) use of other medications    Essential hypertension, benign    Family history of thyroid  disease    Frequent unifocal PVCs    GBS (Guillain Barre syndrome) (HCC)    Morbid obesity (HCC)    Myalgia and myositis, unspecified    Prostate cancer (HCC) 2012   Pulmonary HTN (HCC) 08/14/2018   PASP by echo 2017   Sleep apnea with use of continuous positive airway pressure (CPAP)    Thrombocytopenia (HCC) 05/25/2014   Type II or unspecified type diabetes mellitus without mention of complication, not stated as uncontrolled    Past Surgical History:  Procedure Laterality Date   ANGIOPLASTY  1997   Dr. Bulah   APPENDECTOMY     CATARACT EXTRACTION Left 05/18/2020   CATARACT EXTRACTION Right 06/08/2020   radioactive seed prostate     Family History  Problem Relation Age of Onset   CVA Father    Hypertension Father    Diabetes Mother    Hypothyroidism Sister    Hypertension Sister    Social History   Socioeconomic History   Marital status: Married    Spouse name: Not on file   Number of children: 0   Years of education: 12    Highest education level: 12th grade  Occupational History   Occupation: Building surveyor: [employer]    Comment: mod/heavy lifting  Tobacco Use   Smoking status: Never   Smokeless tobacco: Never  Vaping Use   Vaping status: Never Used  Substance and Sexual Activity   Alcohol use: No   Drug use: No   Sexual activity: Not on file  Other Topics Concern   Not on  file  Social History Narrative   Lives at home with his wife.   Right-handed.   Caffeine: occasional use.   Social Drivers of Corporate investment banker Strain: Low Risk  (09/17/2023)   Overall Financial Resource Strain (CARDIA)    Difficulty of Paying Living Expenses: Not very hard  Food Insecurity: No Food Insecurity (09/17/2023)   Hunger Vital Sign    Worried About Running Out of Food in the Last Year: Never true    Ran Out of Food in the Last Year: Never true  Transportation Needs: No Transportation Needs (09/17/2023)   PRAPARE - Administrator, Civil Service (Medical): No    Lack of Transportation (Non-Medical): No  Physical Activity: Insufficiently Active (09/17/2023)   Exercise Vital Sign    Days of Exercise per Week: 4 days    Minutes of Exercise per Session: 20 min  Stress: No Stress Concern Present (09/17/2023)   [employer] of Occupational Health - Occupational Stress Questionnaire    Feeling of Stress: Only a little  Social Connections: Moderately Integrated (09/17/2023)   Social Connection and Isolation Panel    Frequency of Communication with Friends and Family: More than three times a week    Frequency of Social Gatherings with Friends and Family: Patient declined    Attends Religious Services: 1 to 4 times per year    Active Member of [employer] or Organizations: No    Attends Engineer, structural: Not on file    Marital Status: Married    Tobacco Counseling Counseling given: Not Answered   Clinical Intake:  Pre-visit preparation completed: Yes  Pain : No/denies  pain     Diabetes: Yes CBG done?: No Did pt. bring in CBG monitor from home?: No  How often do you need to have someone help you when you read instructions, pamphlets, or other written materials from your doctor or pharmacy?: 1 - Never  Interpreter Needed?: No  Information entered by :: Mliss Graff LPN   Activities of Daily Living    09/17/2023    9:12 AM  In your present state of health, do you have any difficulty performing the following activities:  Hearing? 0  Vision? 0  Difficulty concentrating or making decisions? 0  Walking or climbing stairs? 0  Dressing or bathing? 0  Doing errands, shopping? 0  Preparing Food and eating ? N  Using the Toilet? N  In the past six months, have you accidently leaked urine? Y  Do you have problems with loss of bowel control? N  Managing your Medications? N  Managing your Finances? N  Housekeeping or managing your Housekeeping? N    Patient Care Team: Mahlon Comer BRAVO, MD as PCP - General (Family Medicine) O'Neal, Darryle Ned, MD as PCP - Cardiology (Cardiology) Shlomo Wilbert SAUNDERS, MD as Consulting Physician (Cardiology) Neysa Reggy BIRCH, MD as Consulting Physician (Pulmonary Disease) Ottelin, Mark, MD (Inactive) as Consulting Physician (Urology) Celestia Agent, MD (Inactive) as Consulting Physician (Gastroenterology)  Indicate any recent Medical Services you may have received from other than [facility] providers in the past year (date may be approximate).     Assessment:   This is a routine wellness examination for [facility].  Hearing/Vision screen Hearing Screening - Comments:: No trouble hearing Vision Screening - Comments:: [place]  Up to date   Goals Addressed             This Visit's Progress    Patient Stated  Loose weight       Depression Screen    09/18/2023    8:17 AM 05/23/2023    7:51 AM 02/22/2023    8:14 AM 08/21/2022    8:04 AM 05/23/2022    8:43 AM 05/21/2022    8:25 AM 02/19/2022    8:10 AM  PHQ 2/9 Scores   PHQ - 2 Score 2 4 0 0 2 2 4   PHQ- 9 Score 7 15 12  0 6 10 14     Fall Risk    09/18/2023    8:17 AM 09/17/2023    9:12 AM 05/23/2023    7:51 AM 11/22/2022    7:49 AM 08/21/2022    8:07 AM  Fall Risk   Falls in the past year? 0 0 0 0 0  Number falls in past yr: 0 0 0 0 0  Injury with Fall? 0 0 0 0 0  Risk for fall due to :    No Fall Risks No Fall Risks  Follow up Falls evaluation completed;Education provided;Falls prevention discussed   Falls evaluation completed Falls evaluation completed    MEDICARE RISK AT HOME: Medicare Risk at Home Any stairs in or around the home?: No If so, are there any without handrails?: No Home free of loose throw rugs in walkways, pet beds, electrical cords, etc?: Yes Adequate lighting in your home to reduce risk of falls?: Yes Life alert?: No Use of a cane, walker or w/c?: Yes Grab bars in the bathroom?: Yes Shower chair or bench in shower?: Yes Elevated toilet seat or a handicapped toilet?: Yes  TIMED UP AND GO:  Was the test performed?  No    Cognitive Function:        09/18/2023    8:16 AM 05/23/2022    8:40 AM  6CIT Screen  What Year? 0 points 0 points  What month? 0 points 0 points  What time? 0 points 0 points  Count back from 20 0 points 0 points  Months in reverse 0 points 0 points  Repeat phrase 0 points 0 points  Total Score 0 points 0 points    Immunizations Immunization History  Administered Date(s) Administered   Hepatitis B 01/17/2016   Influenza Split 09/26/2015   Influenza,inj,Quad PF,6+ Mos 10/07/2015, 10/31/2016, 09/30/2017, 10/15/2018, 03/01/2020, 10/06/2020   MMR 05/31/2015   PPD Test 06/08/2009   Pneumococcal Polysaccharide-23 07/28/2014   Td 06/15/1993   Tdap 06/15/2006, 06/13/2016    TDAP status: Up to date  Flu Vaccine status: Declined, Education has been provided regarding the importance of this vaccine but patient still declined. Advised may receive this vaccine at local pharmacy or Health Dept. Aware to  provide a copy of the vaccination record if obtained from local pharmacy or Health Dept. Verbalized acceptance and understanding.  Pneumococcal vaccine status: Declined,  Education has been provided regarding the importance of this vaccine but patient still declined. Advised may receive this vaccine at local pharmacy or Health Dept. Aware to provide a copy of the vaccination record if obtained from local pharmacy or Health Dept. Verbalized acceptance and understanding.   Covid-19 vaccine status: Declined, Education has been provided regarding the importance of this vaccine but patient still declined. Advised may receive this vaccine at local pharmacy or Health Dept.or vaccine clinic. Aware to provide a copy of the vaccination record if obtained from local pharmacy or Health Dept. Verbalized acceptance and understanding.  Qualifies for Shingles Vaccine? Yes   Zostavax completed No   Shingrix Completed?: No.  Education has been provided regarding the importance of this vaccine. Patient has been advised to call insurance company to determine out of pocket expense if they have not yet received this vaccine. Advised may also receive vaccine at local pharmacy or Health Dept. Verbalized acceptance and understanding.  Screening Tests Health Maintenance  Topic Date Due   Zoster Vaccines- Shingrix (1 of 2) Never done   Pneumococcal Vaccine: 50+ Years (2 of 2 - PCV) 07/28/2015   INFLUENZA VACCINE  08/16/2023   FOOT EXAM  08/21/2023   HEMOGLOBIN A1C  11/23/2023   OPHTHALMOLOGY EXAM  01/15/2024   Diabetic kidney evaluation - eGFR measurement  05/22/2024   Diabetic kidney evaluation - Urine ACR  05/22/2024   Medicare Annual Wellness (AWV)  09/17/2024   DTaP/Tdap/Td (4 - Td or Tdap) 06/14/2026   Colonoscopy  12/07/2028   Hepatitis C Screening  Completed   HPV VACCINES  Aged Out   Meningococcal B Vaccine  Aged Out   Hepatitis B Vaccines 19-59 Average Risk  Discontinued   COVID-19 Vaccine  Discontinued     Health Maintenance  Health Maintenance Due  Topic Date Due   Zoster Vaccines- Shingrix (1 of 2) Never done   Pneumococcal Vaccine: 50+ Years (2 of 2 - PCV) 07/28/2015   INFLUENZA VACCINE  08/16/2023   FOOT EXAM  08/21/2023    Colorectal cancer screening: Type of screening: Colonoscopy. Completed 2020. Repeat every 10 years  Lung Cancer Screening: (Low Dose CT Chest recommended if Age 104-80 years, 20 pack-year currently smoking OR have quit w/in 15years.) does not qualify.   Lung Cancer Screening Referral:   Additional Screening:  Hepatitis C Screening:  Completed   Vision Screening: Recommended annual ophthalmology exams for early detection of glaucoma and other disorders of the eye. Is the patient up to date with their annual eye exam?  Yes  Who is the provider or what is the name of the office in which the patient attends annual eye exams? [facility] If pt is not established with a provider, would they like to be referred to a provider to establish care? Yes .   Dental Screening: Recommended annual dental exams for proper oral hygiene  Nutrition Risk Assessment:  Has the patient had any N/V/D within the last 2 months?  No  Does the patient have any non-healing wounds?  No  Has the patient had any unintentional weight loss or weight gain?  No   Diabetes:  Is the patient diabetic?  Yes  If diabetic, was a CBG obtained today?  No  Did the patient bring in their glucometer from home?  No  How often do you monitor your CBG's? 2 x a day.   Financial Strains and Diabetes Management:  Are you having any financial strains with the device, your supplies or your medication? No .  Does the patient want to be seen by Chronic Care Management for management of their diabetes?  No  Would the patient like to be referred to a Nutritionist or for Diabetic Management?  No   Diabetic Exams:  Diabetic Eye Exam: Completed .  Pt has been advised about the importance in completing this  exam.  Diabetic Foot Exam: . Pt has been advised about the importance in completing this exam. .    Community Resource Referral / Chronic Care Management: CRR required this visit?  No   CCM required this visit?  No     Plan:     I have personally reviewed and noted  the following in the patient's chart:   Medical and social history Use of alcohol, tobacco or illicit drugs  Current medications and supplements including opioid prescriptions. Patient is not currently taking opioid prescriptions. Functional ability and status Nutritional status Physical activity Advanced directives List of other physicians Hospitalizations, surgeries, and ER visits in previous 12 months Vitals Screenings to include cognitive, depression, and falls Referrals and appointments  In addition, I have reviewed and discussed with patient certain preventive protocols, quality metrics, and best practice recommendations. A written personalized care plan for preventive services as well as general preventive health recommendations were provided to patient.     Mliss Graff, LPN   [id number]   After Visit Summary: (MyChart) Due to this being a telephonic visit, the after visit summary with patients personalized plan was offered to patient via MyChart   Nurse Notes:

## 2023-09-23 ENCOUNTER — Encounter: Payer: Self-pay | Admitting: Family Medicine

## 2023-09-23 ENCOUNTER — Ambulatory Visit: Admitting: Family Medicine

## 2023-09-23 VITALS — BP 128/68 | HR 65 | Temp 98.1°F | Resp 18 | Ht 68.0 in | Wt 294.6 lb

## 2023-09-23 DIAGNOSIS — G4700 Insomnia, unspecified: Secondary | ICD-10-CM

## 2023-09-23 DIAGNOSIS — Z7984 Long term (current) use of oral hypoglycemic drugs: Secondary | ICD-10-CM | POA: Diagnosis not present

## 2023-09-23 DIAGNOSIS — E119 Type 2 diabetes mellitus without complications: Secondary | ICD-10-CM

## 2023-09-23 LAB — BASIC METABOLIC PANEL WITH GFR
BUN: 22 mg/dL (ref 6–23)
CO2: 25 meq/L (ref 19–32)
Calcium: 10.2 mg/dL (ref 8.4–10.5)
Chloride: 103 meq/L (ref 96–112)
Creatinine, Ser: 0.85 mg/dL (ref 0.40–1.50)
GFR: 90.05 mL/min (ref >60.00)
Glucose, Bld: 127 mg/dL — ABNORMAL HIGH (ref 70–99)
Potassium: 4.3 meq/L (ref 3.5–5.1)
Sodium: 139 meq/L (ref 135–145)

## 2023-09-23 LAB — HEMOGLOBIN A1C: Hgb A1c MFr Bld: 6.8 % — ABNORMAL HIGH (ref 4.6–6.5)

## 2023-09-23 MED ORDER — FLUOXETINE HCL 20 MG PO CAPS: 20.0000 mg | ORAL_CAPSULE | Freq: Every day | ORAL | 3 refills | Status: DC

## 2023-09-23 NOTE — Progress Notes (Signed)
   Subjective:    Patient ID: Travis [name], male    DOB: [birth year], 68 y.o.   MRN: [id number]  HPI DM- chronic problem, on Metformin  1000mg  BID w/ hx of good control.  Last A1C 6.5%  UTD on eye exam, microalbumin.  Foot exam done by Podiatry.  Down 10 lbs since last visit- pt has moved and been more active than usual.  No CP, SOB, HA's, visual changes, abd pain, N/V.  Insomnia- pt reports trouble falling asleep and staying asleep.  Currently on Trazodone .  Pt reports he will sleep well for 2-3 hrs and then need to get up and use the bathroom.  Then he will lie there and will have continuous/racing thoughts.   Review of Systems For ROS see HPI     Objective:   Physical Exam Vitals reviewed.  Constitutional:      General: He is not in acute distress.    Appearance: Normal appearance. He is well-developed. He is obese. He is not ill-appearing.  HENT:     Head: Normocephalic and atraumatic.  Eyes:     Extraocular Movements: Extraocular movements intact.     Conjunctiva/sclera: Conjunctivae normal.     Pupils: Pupils are equal, round, and reactive to light.  Neck:     Thyroid : No thyromegaly.  Cardiovascular:     Rate and Rhythm: Normal rate and regular rhythm.     Pulses: Normal pulses.     Heart sounds: Normal heart sounds. No murmur heard. Pulmonary:     Effort: Pulmonary effort is normal. No respiratory distress.     Breath sounds: Normal breath sounds.  Abdominal:     General: Bowel sounds are normal. There is no distension.     Palpations: Abdomen is soft.  Musculoskeletal:     Cervical back: Normal range of motion and neck supple.     Right lower leg: No edema.     Left lower leg: No edema.  Lymphadenopathy:     Cervical: No cervical adenopathy.  Skin:    General: Skin is warm and dry.  Neurological:     General: No focal deficit present.     Mental Status: He is alert and oriented to person, place, and time.     Cranial Nerves: No cranial nerve deficit.   Psychiatric:        Mood and Affect: Mood normal.        Behavior: Behavior normal.           Assessment & Plan:

## 2023-09-23 NOTE — Assessment & Plan Note (Signed)
 Deteriorated.  Pt reports having difficulty sleeping due to racing, continuous thoughts.  Also reports increased irritability during the day.  Will add Fluoxetine  to the Wellbutrin  he is currently taking.  Pt expressed understanding and is in agreement w/ plan.

## 2023-09-23 NOTE — Patient Instructions (Signed)
 Follow up in 3-4 months to recheck sugar, blood pressure, cholesterol We'll notify you of your lab results and make any changes if needed Continue to work on healthy diet and regular exercise- you can do it! START the Fluoxetine  once daily to help w/ mood and sleep Call with any questions or concerns Stay Safe!  Stay Healthy!

## 2023-09-23 NOTE — Assessment & Plan Note (Signed)
 Chronic problem.  On Metformin  BID w/o difficulty.  Hx of good control.  UTD on eye exam and microalbumin.  Sees podiatry for foot exam.  Check labs.  Adjust meds prn.

## 2023-09-24 ENCOUNTER — Other Ambulatory Visit: Payer: Self-pay | Admitting: General Practice

## 2023-09-24 ENCOUNTER — Ambulatory Visit: Payer: Self-pay | Admitting: Family Medicine

## 2023-09-24 NOTE — Progress Notes (Signed)
 Pt has been notified.

## 2023-09-27 ENCOUNTER — Encounter: Payer: Self-pay | Admitting: Podiatry

## 2023-09-27 ENCOUNTER — Ambulatory Visit (INDEPENDENT_AMBULATORY_CARE_PROVIDER_SITE_OTHER): Admitting: Podiatry

## 2023-09-27 DIAGNOSIS — B351 Tinea unguium: Secondary | ICD-10-CM | POA: Diagnosis not present

## 2023-09-27 DIAGNOSIS — M79675 Pain in left toe(s): Secondary | ICD-10-CM

## 2023-09-27 DIAGNOSIS — M79674 Pain in right toe(s): Secondary | ICD-10-CM | POA: Diagnosis not present

## 2023-09-27 DIAGNOSIS — E119 Type 2 diabetes mellitus without complications: Secondary | ICD-10-CM

## 2023-09-27 NOTE — Progress Notes (Signed)
 This patient returns to my office for at risk foot care.  This patient requires this care by a professional since this patient will be at risk due to having type 2 diabetes.  This patient is unable to cut nails himself since the patient cannot reach his nails.These nails are painful walking and wearing shoes.  This patient presents for at risk foot care today.  General Appearance  Alert, conversant and in no acute stress.  Vascular  Dorsalis pedis and posterior tibial  pulses are palpable  bilaterally.  Capillary return is within normal limits  bilaterally. Temperature is within normal limits  bilaterally.  Neurologic  Senn-Weinstein monofilament wire test within normal limits  bilaterally. Muscle power within normal limits bilaterally.  Nails Thick disfigured discolored nails with subungual debris  from hallux to fifth toes bilaterally. No evidence of bacterial infection or drainage bilaterally.  Orthopedic  No limitations of motion  feet .  No crepitus or effusions noted.  HAV  B/L.  Hammer toes  B/L.  Skin  normotropic skin with no porokeratosis noted bilaterally.  No signs of infections or ulcers noted.     Onychomycosis  Pain in right toes  Pain in left toes  Consent was obtained for treatment procedures.   Mechanical debridement of nails 1-5  bilaterally performed with a nail nipper.  Filed with dremel without incident.    Return office visit    3 months                  Told patient to return for periodic foot care and evaluation due to potential at risk complications.   Helane Gunther DPM

## 2023-09-28 ENCOUNTER — Other Ambulatory Visit: Payer: Self-pay | Admitting: General Practice

## 2023-10-03 ENCOUNTER — Other Ambulatory Visit: Payer: Self-pay | Admitting: General Practice

## 2023-10-16 ENCOUNTER — Other Ambulatory Visit: Payer: Self-pay | Admitting: Family Medicine

## 2023-10-18 NOTE — Progress Notes (Signed)
 [name] [address] [place] [address] Phone: [phone] Subjective:   Travis [name], am serving as a scribe for Dr. Arthea Claudene.  I'm seeing this patient by the request  of:  Travis Comer BRAVO, MD  CC: Bilateral knee pain  [name]  07/22/2023 Injection given, discussed icing regimen and home exercises, discussed which activities to do in which ones to avoid. Needs to get MRi less then 40 for surgical intervention  Follow-up again in 12 weeks.      Update 10/22/2023 Travis [name] is a 68 y.o. male coming in with complaint of B knee pain. Patient states that his knees are aching for past week. Has stabbing pain intermittently.  Still using the rolling walker.  Patient is down 15 pounds in the last month.      Past Medical History:  Diagnosis Date   Appendicitis    Blood in stool    Coronary artery disease 1997   s/p PCI by Dr. Bulah   Diverticulosis of colon with hemorrhage 2009   Dyslipidemia, goal LDL below 70    Edema    Encounter for long-term (current) use of other medications    Essential hypertension, benign    Family history of thyroid  disease    Frequent unifocal PVCs    GBS (Guillain Barre syndrome)    Morbid obesity (HCC)    Myalgia and myositis, unspecified    Prostate cancer (HCC) 2012   Pulmonary HTN (HCC) 08/14/2018   PASP by echo 2017   Sleep apnea with use of continuous positive airway pressure (CPAP)    Thrombocytopenia 05/25/2014   Type II or unspecified type diabetes mellitus without mention of complication, not stated as uncontrolled    Past Surgical History:  Procedure Laterality Date   ANGIOPLASTY  1997   Dr. 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   Right-handed.   Caffeine: occasional use.   Social Drivers of Corporate investment banker Strain: Low Risk  (09/17/2023)   Overall Financial Resource Strain (CARDIA)    Difficulty of Paying Living Expenses: Not very hard  Food Insecurity: No Food Insecurity (09/17/2023)   Hunger Vital Sign    Worried About Running Out of Food in the Last Year: Never true    Ran Out of Food in the Last Year: Never true  Transportation Needs: No Transportation Needs (09/17/2023)   PRAPARE - Administrator, Civil Service (Medical): No    Lack of Transportation (Non-Medical): No  Physical Activity: Insufficiently Active (09/17/2023)   Exercise Vital Sign    Days of Exercise per Week: 4 days  Minutes of Exercise per Session: 20 min  Stress: No Stress Concern Present (09/17/2023)   [employer] of Occupational Health - Occupational Stress Questionnaire    Feeling of Stress: Only a little  Social Connections: Moderately Integrated (09/17/2023)   Social Connection and Isolation Panel    Frequency of Communication with Friends and Family: More than three times a week    Frequency of Social Gatherings with Friends and Family: Patient declined    Attends Religious Services: 1 to 4 times per year    Active Member of [employer] or Organizations: No    Attends Engineer, structural: Not on file    Marital Status: Married   Allergies  Allergen Reactions   Crestor  [Rosuvastatin ]     Muscle Pain    Lipitor [Atorvastatin] Other (See  Comments)    Arm weakness    Family History  Problem Relation Age of Onset   CVA Father    Hypertension Father    Diabetes Mother    Hypothyroidism Sister    Hypertension Sister     Current Outpatient Medications (Endocrine & Metabolic):    metFORMIN  (GLUCOPHAGE ) 500 MG tablet, TAKE 2 TABLETS BY MOUTH 2 TIMES DAILY WITH A MEAL.  Current Outpatient Medications (Cardiovascular):    amLODipine  (NORVASC ) 10 MG tablet, TAKE 1 TABLET BY MOUTH EVERY DAY   benazepril  (LOTENSIN ) 40 MG tablet, Take 1 tablet (40 mg total) by mouth daily.   fenofibrate  160 MG tablet, TAKE 1 TABLET BY MOUTH EVERY DAY   furosemide  (LASIX ) 20 MG tablet, TAKE 1 TO 2 TABLETS BY MOUTH TWICE A DAY AS NEEDED FOR EDEMA   metoprolol  succinate (TOPROL -XL) 100 MG 24 hr tablet, TAKE 1 TABLET (100 MG TOTAL) BY MOUTH DAILY MAKE APPOINTMENT FOR REFILL   rosuvastatin  (CRESTOR ) 20 MG tablet, TAKE 1 TABLET BY MOUTH EVERY DAY   sildenafil  (VIAGRA ) 100 MG tablet, Take 0.5-1 tablets (50-100 mg total) by mouth daily as needed for erectile dysfunction.  Current Outpatient Medications (Respiratory):    albuterol  (VENTOLIN  HFA) 108 (90 Base) MCG/ACT inhaler, TAKE 2 PUFFS BY MOUTH EVERY 6 HOURS AS NEEDED FOR WHEEZE OR SHORTNESS OF BREATH  Current Outpatient Medications (Analgesics):    acetaminophen  (TYLENOL ) 500 MG tablet, Take 1,000 mg by mouth every 6 (six) hours as needed for moderate pain. Taking 650 mg arthritis BID   allopurinol  (ZYLOPRIM ) 100 MG tablet, TAKE 2 TABLETS BY MOUTH EVERY DAY   aspirin  EC 81 MG tablet, Take 81 mg by mouth daily.   meloxicam  (MOBIC ) 15 MG tablet, Take 1 tablet (15 mg total) by mouth daily.  Current Outpatient Medications (Hematological):    Cyanocobalamin (VITAMIN B-12 PO), Take 1,000 mcg by mouth.   Current Outpatient Medications (Other):    Ascorbic Acid (VITAMIN C) 500 MG CAPS, Take by mouth.   buPROPion  (WELLBUTRIN  XL) 300 MG 24 hr tablet, TAKE 1 TABLET BY MOUTH EVERY DAY   Cholecalciferol  (VITAMIN D3 PO), Take 2,000 Units by mouth daily.    FLUoxetine  (PROZAC ) 20 MG capsule, TAKE 1 CAPSULE BY MOUTH EVERY DAY   gabapentin  (NEURONTIN ) 300 MG capsule, TAKE 1 CAPSULE BY MOUTH THREE TIMES A DAY   magnesium gluconate (MAGONATE) 500 MG tablet, Take 500 mg by mouth daily.    Misc Natural Products (TART CHERRY ADVANCED PO), Take 1,200 mg by mouth daily.    Omega-3 Fatty Acids (SALMON OIL-1000 PO), Take by mouth.   Potassium 99 MG TABS, Take 99 mg by mouth.   traZODone  (DESYREL ) 100 MG  tablet, TAKE 1 TABLET BY MOUTH EVERYDAY AT BEDTIME   zinc gluconate 50 MG tablet, Take 50 mg by mouth daily.   Reviewed prior external information including notes and imaging from  primary care provider As well as notes that were available from care everywhere and other healthcare systems.  Past medical history, social, surgical and family history all reviewed in electronic medical record.  No pertanent information unless stated regarding to the chief complaint.   Review of Systems:  No headache, visual changes, nausea, vomiting, diarrhea, constipation, dizziness, abdominal pain, skin rash, fevers, chills, night sweats, weight loss, swollen lymph nodes, body aches, joint swelling, chest pain, shortness of breath, mood changes. POSITIVE muscle aches  Objective  Blood pressure 120/82, pulse 97, height 5' 8 (1.727 m), weight 289 lb (131.1 kg), SpO2 95%.   General: No apparent distress alert and oriented x3 mood and affect normal, dressed appropriately.  HEENT: Pupils equal, extraocular movements intact  Respiratory: Patient's speak in full sentences and does not appear short of breath  Cardiovascular: Significant edema noted.  Bilateral knee exam shows significant arthritic changes noted.  Effusion noted on the left knee.  Ambulates with the aid of a rolling walker.  After informed written and verbal consent, patient was seated on exam table. Right knee was prepped with alcohol swab and utilizing  anterolateral approach, patient's right knee space was injected with 4:1  marcaine 0.5%: Kenalog 40mg /dL. Patient tolerated the procedure well without immediate complications.  After informed written and verbal consent, patient was seated on exam table. Left knee was prepped with alcohol swab and utilizing anterolateral approach, patient's left knee space was injected with 4:1  marcaine 0.5%: Kenalog 40mg /dL. Patient tolerated the procedure well without immediate complications.   Impression and Recommendations:    The above documentation has been reviewed and is accurate and complete Fatuma Dowers M Renie Stelmach, DO

## 2023-10-22 ENCOUNTER — Encounter: Payer: Self-pay | Admitting: Family Medicine

## 2023-10-22 ENCOUNTER — Ambulatory Visit: Admitting: Family Medicine

## 2023-10-22 ENCOUNTER — Telehealth: Payer: Self-pay

## 2023-10-22 VITALS — BP 120/82 | HR 97 | Ht 68.0 in | Wt 289.0 lb

## 2023-10-22 DIAGNOSIS — M170 Bilateral primary osteoarthritis of knee: Secondary | ICD-10-CM | POA: Diagnosis not present

## 2023-10-22 NOTE — Telephone Encounter (Signed)
 Patient ran for Monovisc for bilateral knees on 10/22/23. Case [id number]. Pending approval.

## 2023-10-22 NOTE — Assessment & Plan Note (Signed)
 Chronic, with worsening symptoms.  Patient is doing a good job trying to lose weight at the moment.  Need to get BMI under 40 to be a candidate for any type of replacement and is under 44 which is the best he has done in quite some time.  Steroid injection was given again today and we will try to get approval for viscosupplementation which patient has responded extremely well to in the past.  Follow-up again in 2 months otherwise.  Social determinants of health we have decreased physical activity secondary to osteoarthritic changes in multiple joints.

## 2023-10-22 NOTE — Patient Instructions (Signed)
Injected both knees See me in 3 months 

## 2023-10-22 NOTE — Telephone Encounter (Signed)
-----   Message from Berwyn Posey sent at 10/22/2023  7:54 AM EDT ----- Regarding: visco Can you please run patient for visco for both knees?  Thanks

## 2023-10-23 NOTE — Telephone Encounter (Signed)
 Patient needs an appointment once medication is stocked.  Monovisc approved for bilateral knees.  Primary: Deductible applies. Since the deductible has been met, patient is responsible for a coinsurance. Prior authorization is not required.  Secondary: Plan follows Medicare guidelines. It will pick up remaining eligible expenses at 100%. It does not cover the Medicare Part B deductible.  Ref# [id number]: [id number] ,  Availity: [id number] Exp: 04/21/2024

## 2023-10-23 NOTE — Telephone Encounter (Signed)
 Scheduled 1/29

## 2023-10-31 ENCOUNTER — Other Ambulatory Visit: Payer: Self-pay | Admitting: Family Medicine

## 2023-11-11 ENCOUNTER — Other Ambulatory Visit: Payer: Self-pay | Admitting: Family Medicine

## 2023-11-13 ENCOUNTER — Other Ambulatory Visit: Payer: Self-pay | Admitting: Family Medicine

## 2023-11-25 NOTE — Progress Notes (Unsigned)
 Cardiology Office Note:  .   Date:  11/27/2023  ID:  ADELARD SANON, DOB 17-Nov-1955, MRN [id number] PCP: Mahlon Comer FORBES, MD  [facility] Providers Cardiologist:  Darryle ONEIDA Decent, MD {   History of Present Illness: .    Chief Complaint  Patient presents with   Follow-up    TABIAS SWAYZE is a 68 y.o. male with history of CAD, PVCs, HTN, HLD, DM who presents for follow-up.    History of Present Illness   KORVER GRAYBEAL is a 68 year old male with coronary artery disease, diabetes, and hyperlipidemia who presents for follow-up.  He has a history of coronary artery disease and underwent angioplasty in 1997. He experiences no symptoms of angina, and there is no chest pain or dyspnea. He is on aspirin  81 mg daily and metoprolol  succinate 100 mg daily. Occasionally, he feels premature ventricular contractions (PVCs), but they are infrequent and not bothersome.  He does not mention any specific symptoms related to his diabetes during this visit.  For hyperlipidemia, he takes rosuvastatin  20 mg daily and fenofibrate  160 mg daily. His triglycerides were elevated at 391 mg/dL during his last check in May, despite fasting. He denies any recent dietary changes and prefers to manage his weight independently without additional medications.  His hypertension is well controlled with amlodipine  10 mg daily and benazepril  40 mg daily. He uses Lasix  only as needed for edema, which occurs less frequently since he is no longer working.  He has recently moved, which was physically demanding. He is not currently employed and is adjusting to changes in financial and insurance situations. He engages in physical activity by walking a couple of times a week and running errands, which he finds beneficial.          Problem List 1. CAD -POBA 1997 2. HTN 3. DM -A1c 6.8 4. HLD -T chol 140, LDL 31, HDL 31, TG 391 5. PVCs  -brief 6. Guillain Barre Syndrome  7. OSA    ROS: All other ROS reviewed  and negative. Pertinent positives noted in the HPI.     Studies Reviewed: SABRA   EKG Interpretation Date/Time:  Wednesday November 27 2023 07:59:24 EST Ventricular Rate:  66 PR Interval:  178 QRS Duration:  96 QT Interval:  418 QTC Calculation: 438 R Axis:   43  Text Interpretation: Normal sinus rhythm Normal ECG Confirmed by Decent Darryle [phone]) on 11/27/2023 8:01:14 AM   TTE 08/25/2018  1. The left ventricle has normal systolic function with an ejection  fraction of 60-65%. The cavity size was normal. Left ventricular diastolic  Doppler parameters are consistent with pseudonormalization. Elevated left  ventricular end-diastolic pressure  No evidence of left ventricular regional wall motion abnormalities.   2. The right ventricle has normal systolic function. The cavity was  mildly enlarged. There is no increase in right ventricular wall thickness.  Right ventricular systolic pressure Cannot assess as IVC is not  visualized. There is at least moderate  pulmonary HTN as the TR pk gradient is ..   3. Left atrial size was mildly dilated.   4. The aortic valve is tricuspid. Mild sclerosis of the aortic valve.   5. The aorta is normal in size and structure.  Physical Exam:   VS:  BP 124/70 (BP Location: Right Arm, Patient Position: Sitting, Cuff Size: Large)   Pulse 66   Ht 5' 7 (1.702 m)   Wt 284 lb 3.2 oz (128.9 kg)  SpO2 94%   BMI 44.51 kg/m    Wt Readings from Last 3 Encounters:  11/27/23 284 lb 3.2 oz (128.9 kg)  10/22/23 289 lb (131.1 kg)  09/23/23 294 lb 9.6 oz (133.6 kg)    GEN: Well nourished, well developed in no acute distress NECK: No JVD; No carotid bruits CARDIAC: RRR, no murmurs, rubs, gallops RESPIRATORY:  Clear to auscultation without rales, wheezing or rhonchi  ABDOMEN: Soft, non-tender, non-distended EXTREMITIES:  No edema; No deformity  ASSESSMENT AND PLAN: .   Assessment and Plan    Atherosclerotic heart disease of native coronary artery without  angina pectoris Remote angioplasty in 1997, asymptomatic. EKG negative for infarction. Focused on preventing cardiac events. - Continue aspirin  81 mg daily. - Continue rosuvastatin  20 mg daily. - Continue metoprolol  succinate 100 mg daily. - Schedule follow-up in one year.  Hyperlipidemia LDL controlled at 31 mg/dL, triglycerides elevated at 391 mg/dL. On rosuvastatin  and fenofibrate . Recheck lipid panel to assess medication adjustment. - Rechecked lipid panel today. - Consider increasing rosuvastatin  based on lipid panel results.  Essential hypertension Blood pressure well controlled on current regimen. - Continue amlodipine  10 mg daily. - Continue benazepril  40 mg daily. - Continue metoprolol  succinate 100 mg daily.  Ventricular premature depolarization Occasional PVCs, asymptomatic. Metoprolol  expected to manage effectively. - Continue metoprolol  succinate 100 mg daily. - Monitor for any changes in symptoms.              Follow-up: Return in about 1 year (around 11/26/2024).  Signed, Darryle DASEN. Barbaraann, MD, [facility]  [facility]  [address] [place], [place] [address] [phone]  8:13 AM

## 2023-11-27 ENCOUNTER — Ambulatory Visit: Attending: Cardiovascular Disease | Admitting: Cardiovascular Disease

## 2023-11-27 ENCOUNTER — Encounter: Payer: Self-pay | Admitting: Cardiovascular Disease

## 2023-11-27 VITALS — BP 124/70 | HR 66 | Ht 67.0 in | Wt 284.2 lb

## 2023-11-27 DIAGNOSIS — I2510 Atherosclerotic heart disease of native coronary artery without angina pectoris: Secondary | ICD-10-CM | POA: Diagnosis not present

## 2023-11-27 DIAGNOSIS — E785 Hyperlipidemia, unspecified: Secondary | ICD-10-CM | POA: Insufficient documentation

## 2023-11-27 DIAGNOSIS — I493 Ventricular premature depolarization: Secondary | ICD-10-CM | POA: Diagnosis not present

## 2023-11-27 DIAGNOSIS — I10 Essential (primary) hypertension: Secondary | ICD-10-CM | POA: Diagnosis present

## 2023-11-27 MED ORDER — METOPROLOL SUCCINATE ER 100 MG PO TB24: 100.0000 mg | ORAL_TABLET | Freq: Every day | ORAL | 3 refills | Status: AC

## 2023-11-27 NOTE — Patient Instructions (Addendum)
 Medication Instructions:  Continue all medications *If you need a refill on your cardiac medications before your next appointment, please call your pharmacy*  Lab Work: Lipid panel and Direct LDL today If you have labs (blood work) drawn today and your tests are completely normal, you will receive your results only by: MyChart Message (if you have MyChart) OR A paper copy in the mail If you have any lab test that is abnormal or we need to change your treatment, we will call you to review the results.  Testing/Procedures: None ordered  Follow-Up: At [facility], you and your health needs are our priority.  As part of our continuing mission to provide you with exceptional heart care, our providers are all part of one team.  This team includes your primary Cardiologist (physician) and Advanced Practice Providers or APPs (Physician Assistants and Nurse Practitioners) who all work together to provide you with the care you need, when you need it.  Your next appointment:  1 year    Call in July to schedule Nov appointment     Provider:  Dr.O'Neal's PA    We recommend signing up for the patient portal called MyChart.  Sign up information is provided on this After Visit Summary.  MyChart is used to connect with patients for Virtual Visits (Telemedicine).  Patients are able to view lab/test results, encounter notes, upcoming appointments, etc.  Non-urgent messages can be sent to your provider as well.   To learn more about what you can do with MyChart, go to [email or web address].

## 2023-11-28 ENCOUNTER — Ambulatory Visit: Payer: Self-pay | Admitting: Cardiovascular Disease

## 2023-11-28 LAB — LIPID PANEL
Chol/HDL Ratio: 3.9 ratio (ref 0.0–5.0)
Cholesterol, Total: 120 mg/dL (ref 100–199)
HDL: 31 mg/dL — ABNORMAL LOW (ref >39)
LDL Chol Calc (NIH): 53 mg/dL (ref 0–99)
Triglycerides: 225 mg/dL — ABNORMAL HIGH (ref 0–149)
VLDL Cholesterol Cal: 36 mg/dL (ref 5–40)

## 2023-11-28 LAB — LDL CHOLESTEROL, DIRECT: LDL Direct: 58 mg/dL (ref 0–99)

## 2023-11-30 ENCOUNTER — Other Ambulatory Visit: Payer: Self-pay | Admitting: Family Medicine

## 2023-12-09 ENCOUNTER — Other Ambulatory Visit: Payer: Self-pay | Admitting: Family Medicine

## 2023-12-12 ENCOUNTER — Other Ambulatory Visit: Payer: Self-pay | Admitting: Family Medicine

## 2023-12-19 NOTE — Progress Notes (Deleted)
 [name] [address] [place] [address] Phone: [phone] Subjective:    I'm seeing this patient by the request  of:  Mahlon Comer BRAVO, MD  CC:   [name]  10/22/2023 Chronic, with worsening symptoms.  Patient is doing a good job trying to lose weight at the moment.  Need to get BMI under 40 to be a candidate for any type of replacement and is under 44 which is the best he has done in quite some time.  Steroid injection was given again today and we will try to get approval for viscosupplementation which patient has responded extremely well to in the past.  Follow-up again in 2 months otherwise.  Social determinants of health we have decreased physical activity secondary to osteoarthritic changes in multiple joints.      Update 12/24/2023 Travis [name] is a 68 y.o. male coming in with complaint of B knee pain. Monovisc approved for B knees. Patient states       Past Medical History:  Diagnosis Date   Appendicitis    Blood in stool    Coronary artery disease 1997   s/p PCI by Dr. Bulah   Diverticulosis of colon with hemorrhage 2009   Dyslipidemia, goal LDL below 70    Edema    Encounter for long-term (current) use of other medications    Essential hypertension, benign    Family history of thyroid  disease    Frequent unifocal PVCs    GBS (Guillain Barre syndrome)    Morbid obesity (HCC)    Myalgia and myositis, unspecified    Prostate cancer (HCC) 2012   Pulmonary HTN (HCC) 08/14/2018   PASP by echo 2017   Sleep apnea with use of continuous positive airway pressure (CPAP)    Thrombocytopenia 05/25/2014   Type II or unspecified type diabetes mellitus without mention of complication, not stated as uncontrolled    Past Surgical History:  Procedure Laterality Date   ANGIOPLASTY  1997   Dr. 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   Right-handed.   Caffeine: occasional use.   Social Drivers of Corporate Investment Banker Strain: Low Risk  (09/17/2023)   Overall Financial Resource Strain (CARDIA)    Difficulty of Paying Living Expenses: Not very hard  Food Insecurity: No Food Insecurity (09/17/2023)   Hunger Vital Sign    Worried About Running Out of Food in the Last Year: Never true    Ran Out of Food in the Last Year: Never true  Transportation Needs: No Transportation Needs (09/17/2023)   PRAPARE - Administrator, Civil Service (  Medical): No    Lack of Transportation (Non-Medical): No  Physical Activity: Insufficiently Active (09/17/2023)   Exercise Vital Sign    Days of Exercise per Week: 4 days    Minutes of Exercise per Session: 20 min  Stress: No Stress Concern Present (09/17/2023)   [employer] of Occupational Health - Occupational Stress Questionnaire    Feeling of Stress: Only a little  Social Connections: Moderately Integrated (09/17/2023)   Social Connection and Isolation Panel    Frequency of Communication with Friends and Family: More than three times a week    Frequency of Social Gatherings with Friends and Family: Patient declined    Attends Religious Services: 1 to 4 times per year    Active Member of [employer] or Organizations: No    Attends Hospital Doctor: Not on file    Marital Status: Married   Allergies  Allergen Reactions   Crestor  [Rosuvastatin ]     Muscle Pain    Lipitor [Atorvastatin] Other (See Comments)    Arm weakness    Family History  Problem Relation Age of Onset   CVA Father    Hypertension Father    Diabetes Mother    Hypothyroidism Sister    Hypertension Sister     Current Outpatient Medications (Endocrine & Metabolic):    metFORMIN  (GLUCOPHAGE ) 500 MG tablet, TAKE 2 TABLETS BY MOUTH 2 TIMES DAILY WITH A MEAL.  Current Outpatient Medications (Cardiovascular):    amLODipine  (NORVASC ) 10 MG tablet, TAKE 1 TABLET BY MOUTH EVERY DAY   benazepril  (LOTENSIN ) 40 MG tablet, Take 1 tablet (40 mg total) by mouth daily.   fenofibrate  160 MG tablet, TAKE 1 TABLET BY MOUTH EVERY DAY   furosemide  (LASIX ) 20 MG tablet, TAKE 1 TO 2 TABLETS BY MOUTH TWICE A DAY AS NEEDED FOR EDEMA   metoprolol  succinate (TOPROL -XL) 100 MG 24 hr tablet, Take 1 tablet (100 mg total) by mouth daily. Take with or immediately following a meal.   rosuvastatin  (CRESTOR ) 20 MG tablet, TAKE 1 TABLET BY MOUTH EVERY DAY   sildenafil  (VIAGRA ) 100 MG tablet, Take 0.5-1 tablets (50-100 mg total) by mouth daily as needed for erectile dysfunction.  Current Outpatient Medications (Respiratory):    albuterol  (VENTOLIN  HFA) 108 (90 Base) MCG/ACT inhaler, TAKE 2 PUFFS BY MOUTH EVERY 6 HOURS AS NEEDED FOR WHEEZE OR SHORTNESS OF BREATH  Current Outpatient Medications (Analgesics):    acetaminophen  (TYLENOL ) 500 MG tablet, Take 1,000 mg by mouth every 6 (six) hours as needed for moderate pain. Taking 650 mg arthritis BID   allopurinol  (ZYLOPRIM ) 100 MG tablet, TAKE 2 TABLETS BY MOUTH EVERY DAY   aspirin  EC 81 MG tablet, Take 81 mg by mouth daily.   meloxicam  (MOBIC ) 15 MG tablet, TAKE 1 TABLET (15 MG TOTAL) BY MOUTH DAILY.  Current Outpatient Medications (Hematological):    Cyanocobalamin (VITAMIN B-12 PO), Take 1,000 mcg by mouth.   Current Outpatient  Medications (Other):    Ascorbic Acid (VITAMIN C) 500 MG CAPS, Take by mouth.   buPROPion  (WELLBUTRIN  XL) 300 MG 24 hr tablet, TAKE 1 TABLET BY MOUTH EVERY DAY   Cholecalciferol (VITAMIN D3 PO), Take 2,000 Units by mouth daily.    FLUoxetine  (PROZAC ) 20 MG capsule, TAKE 1 CAPSULE BY MOUTH EVERY DAY   gabapentin  (NEURONTIN ) 300 MG capsule, TAKE 1 CAPSULE BY MOUTH THREE TIMES A DAY   magnesium gluconate (MAGONATE) 500 MG tablet, Take 500 mg by mouth daily.    Misc Natural Products (TART CHERRY  ADVANCED PO), Take 1,200 mg by mouth daily.    Omega-3 Fatty Acids (SALMON OIL-1000 PO), Take by mouth.   Potassium 99 MG TABS, Take 99 mg by mouth.   traZODone  (DESYREL ) 100 MG tablet, TAKE 1 TABLET BY MOUTH EVERYDAY AT BEDTIME   zinc gluconate 50 MG tablet, Take 50 mg by mouth daily.   Reviewed prior external information including notes and imaging from  primary care provider As well as notes that were available from care everywhere and other healthcare systems.  Past medical history, social, surgical and family history all reviewed in electronic medical record.  No pertanent information unless stated regarding to the chief complaint.   Review of Systems:  No headache, visual changes, nausea, vomiting, diarrhea, constipation, dizziness, abdominal pain, skin rash, fevers, chills, night sweats, weight loss, swollen lymph nodes, body aches, joint swelling, chest pain, shortness of breath, mood changes. POSITIVE muscle aches  Objective  There were no vitals taken for this visit.   General: No apparent distress alert and oriented x3 mood and affect normal, dressed appropriately.  HEENT: Pupils equal, extraocular movements intact  Respiratory: Patient's speak in full sentences and does not appear short of breath  Cardiovascular: No lower extremity edema, non tender, no erythema      Impression and Recommendations:

## 2023-12-23 ENCOUNTER — Encounter: Payer: Self-pay | Admitting: Family Medicine

## 2023-12-23 ENCOUNTER — Ambulatory Visit: Admitting: Family Medicine

## 2023-12-23 VITALS — BP 120/62 | HR 63 | Temp 98.2°F | Ht 67.0 in | Wt 276.0 lb

## 2023-12-23 DIAGNOSIS — E119 Type 2 diabetes mellitus without complications: Secondary | ICD-10-CM

## 2023-12-23 DIAGNOSIS — I10 Essential (primary) hypertension: Secondary | ICD-10-CM

## 2023-12-23 LAB — BASIC METABOLIC PANEL WITH GFR
BUN: 19 mg/dL (ref 6–23)
CO2: 27 meq/L (ref 19–32)
Calcium: 9.6 mg/dL (ref 8.4–10.5)
Chloride: 100 meq/L (ref 96–112)
Creatinine, Ser: 0.81 mg/dL (ref 0.40–1.50)
GFR: 91.21 mL/min (ref >60.00)
Glucose, Bld: 129 mg/dL — ABNORMAL HIGH (ref 70–99)
Potassium: 4.2 meq/L (ref 3.5–5.1)
Sodium: 138 meq/L (ref 135–145)

## 2023-12-23 LAB — CBC WITH DIFFERENTIAL/PLATELET
Basophils Absolute: 0 K/uL (ref 0.0–0.1)
Basophils Relative: 0.2 % (ref 0.0–3.0)
Eosinophils Absolute: 0 K/uL (ref 0.0–0.7)
Eosinophils Relative: 0 % (ref 0.0–5.0)
HCT: 41.3 % (ref 39.0–52.0)
Hemoglobin: 13.6 g/dL (ref 13.0–17.0)
Lymphocytes Relative: 15.1 % (ref 12.0–46.0)
Lymphs Abs: 1 K/uL (ref 0.7–4.0)
MCHC: 32.9 g/dL (ref 30.0–36.0)
MCV: 90.8 fl (ref 78.0–100.0)
Monocytes Absolute: 0.4 K/uL (ref 0.1–1.0)
Monocytes Relative: 6.5 % (ref 3.0–12.0)
Neutro Abs: 5.1 K/uL (ref 1.4–7.7)
Neutrophils Relative %: 78.2 % — ABNORMAL HIGH (ref 43.0–77.0)
Platelets: 186 K/uL (ref 150.0–400.0)
RBC: 4.55 Mil/uL (ref 4.22–5.81)
RDW: 15.2 % (ref 11.5–15.5)
WBC: 6.5 K/uL (ref 4.0–10.5)

## 2023-12-23 LAB — HEPATIC FUNCTION PANEL
ALT: 23 U/L (ref 0–53)
AST: 24 U/L (ref 0–37)
Albumin: 4.3 g/dL (ref 3.5–5.2)
Alkaline Phosphatase: 53 U/L (ref 39–117)
Bilirubin, Direct: 0.1 mg/dL (ref 0.0–0.3)
Total Bilirubin: 0.4 mg/dL (ref 0.2–1.2)
Total Protein: 6.9 g/dL (ref 6.0–8.3)

## 2023-12-23 LAB — TSH: TSH: 1.35 u[IU]/mL (ref 0.35–5.50)

## 2023-12-23 LAB — HEMOGLOBIN A1C: Hgb A1c MFr Bld: 5.9 % (ref 4.6–6.5)

## 2023-12-23 NOTE — Assessment & Plan Note (Signed)
 Chronic problem.  On Amlodipine , Benazepril , and Metoprolol  w/ good control.  Currently asymptomatic.  Check labs due to ACE but no anticipated med changes.  Will follow.

## 2023-12-23 NOTE — Assessment & Plan Note (Signed)
 Pt is 20 lbs since September.  He reports this is due to changing his eating habits.  Now eating 1 meal daily and snacks rather than multiple large meals.  He reports feeling well.  Will continue to follow.

## 2023-12-23 NOTE — Patient Instructions (Addendum)
 Follow up in 3-4 months to recheck diabetes We'll notify you of your lab results and make any changes if needed Continue to work on healthy diet and regular exercise- you're doing great! Call with any questions or concerns Stay Safe!  Stay Healthy! Merry Christmas!!!

## 2023-12-23 NOTE — Progress Notes (Signed)
   Subjective:    Patient ID: [name], male    DOB: [birth year], 68 y.o.   MRN: [id number]  HPI DM- chronic problem, on Metformin  1000mg  BID.  No abd pain, N/V.  + numbness of hands but he has carpal tunnel bilaterally.  UTD on eye exam, microalbumin.  Due for foot exam.  Obesity- ongoing issue.  Down 20 lbs since September.  BMI now 43.23. Pt has decreased intake considerably- typically eating 1 meal daily and snacks.  Will occasionally eat 2 meals.  HTN- chronic problem, on Amlodipine  10mg  daily, Benazepril  40mg  daily, Metoprolol  XL 100mg  daily w/ good control.  No CP, SOB, HA's, visual changes, edema.   Review of Systems For ROS see HPI     Objective:   Physical Exam Vitals reviewed.  Constitutional:      General: He is not in acute distress.    Appearance: Normal appearance. He is well-developed. He is obese. He is not ill-appearing.  HENT:     Head: Normocephalic and atraumatic.  Eyes:     Extraocular Movements: Extraocular movements intact.     Conjunctiva/sclera: Conjunctivae normal.     Pupils: Pupils are equal, round, and reactive to light.  Neck:     Thyroid : No thyromegaly.  Cardiovascular:     Rate and Rhythm: Normal rate and regular rhythm.     Pulses: Normal pulses.     Heart sounds: Normal heart sounds. No murmur heard. Pulmonary:     Effort: Pulmonary effort is normal. No respiratory distress.     Breath sounds: Normal breath sounds.  Abdominal:     General: Bowel sounds are normal. There is no distension.     Palpations: Abdomen is soft.  Musculoskeletal:     Cervical back: Normal range of motion and neck supple.     Right lower leg: No edema.     Left lower leg: No edema.  Lymphadenopathy:     Cervical: No cervical adenopathy.  Skin:    General: Skin is warm and dry.  Neurological:     General: No focal deficit present.     Mental Status: He is alert and oriented to person, place, and time.     Cranial Nerves: No cranial nerve deficit.   Psychiatric:        Mood and Affect: Mood normal.        Behavior: Behavior normal.           Assessment & Plan:

## 2023-12-23 NOTE — Assessment & Plan Note (Signed)
 Chronic problem.  On Metformin  1000mg  BID.  Currently asymptomatic w/ exception of bilateral carpal tunnel.  UTD on eye exam, microalbumin, foot exam done today.  Check labs.  Adjust meds prn

## 2023-12-24 ENCOUNTER — Ambulatory Visit: Payer: Self-pay | Admitting: Family Medicine

## 2023-12-24 ENCOUNTER — Encounter: Payer: Self-pay | Admitting: Family Medicine

## 2023-12-24 ENCOUNTER — Ambulatory Visit: Admitting: Family Medicine

## 2023-12-24 VITALS — BP 118/82 | HR 70 | Ht 67.0 in | Wt 274.0 lb

## 2023-12-24 DIAGNOSIS — M170 Bilateral primary osteoarthritis of knee: Secondary | ICD-10-CM

## 2023-12-24 MED ORDER — HYALURONAN 88 MG/4ML IX SOSY
176.0000 mg | PREFILLED_SYRINGE | Freq: Once | INTRA_ARTICULAR | Status: AC
  Administered 2023-12-24: 176 mg via INTRA_ARTICULAR

## 2023-12-24 NOTE — Progress Notes (Signed)
 [name] [address] [place] [address] Phone: [phone] Subjective:   [name] [name], am serving as a scribe for Dr. Arthea Claudene.  I'm seeing this patient by the request  of:  Mahlon Comer BRAVO, MD  CC: Bilateral knee pain  [name]  10/22/2023 Chronic, with worsening symptoms.  Patient is doing a good job trying to lose weight at the moment.  Need to get BMI under 40 to be a candidate for any type of replacement and is under 44 which is the best he has done in quite some time.  Steroid injection was given again today and we will try to get approval for viscosupplementation which patient has responded extremely well to in the past.  Follow-up again in 2 months otherwise.  Social determinants of health we have decreased physical activity secondary to osteoarthritic changes in multiple joints.      Update 12/24/2023 Travis [name] is a 68 y.o. male coming in with complaint of B knee pain. Monovisc approved for B knees. Patient states that his knee pain has increased due to the weather. Feels like L knee has fluid on it again.       Past Medical History:  Diagnosis Date   Appendicitis    Blood in stool    Coronary artery disease 1997   s/p PCI by Dr. Bulah   Diverticulosis of colon with hemorrhage 2009   Dyslipidemia, goal LDL below 70    Edema    Encounter for long-term (current) use of other medications    Essential hypertension, benign    Family history of thyroid  disease    Frequent unifocal PVCs    GBS (Guillain Barre syndrome)    Morbid obesity (HCC)    Myalgia and myositis, unspecified    Prostate cancer (HCC) 2012   Pulmonary HTN (HCC) 08/14/2018   PASP by echo 2017   Sleep apnea with use of continuous positive airway pressure (CPAP)    Thrombocytopenia 05/25/2014   Type II or unspecified type diabetes mellitus without mention of complication, not stated as uncontrolled    Past Surgical History:   Procedure Laterality Date   ANGIOPLASTY  1997   Dr. Bulah   APPENDECTOMY     CATARACT EXTRACTION Left 05/18/2020   CATARACT EXTRACTION Right 06/08/2020   radioactive seed prostate     Social History   Socioeconomic History   Marital status: Married    Spouse name: Not on file   Number of children: 0   Years of education: 12   Highest education level: 12th grade  Occupational History   Occupation: Building Surveyor:     Comment: mod/heavy lifting   Occupation: Retired  Tobacco Use   Smoking status: Never   Smokeless tobacco: Never  Vaping Use   Vaping status: Never Used  Substance and Sexual Activity   Alcohol use: No   Drug use: No   Sexual activity: Not on file  Other Topics Concern   Not on file  Social History Narrative   Lives at home with his wife.   Right-handed.   Caffeine: occasional use.   Social Drivers of Corporate Investment Banker Strain: Low Risk  (12/20/2023)   Overall Financial Resource Strain (CARDIA)    Difficulty of Paying Living Expenses: Not hard at all  Food Insecurity: No Food Insecurity (12/20/2023)   Hunger Vital Sign    Worried About Running Out of Food in the Last  Year: Never true    Ran Out of Food in the Last Year: Never true  Transportation Needs: No Transportation Needs (12/20/2023)   PRAPARE - Administrator, Civil Service (Medical): No    Lack of Transportation (Non-Medical): No  Physical Activity: Insufficiently Active (12/20/2023)   Exercise Vital Sign    Days of Exercise per Week: 3 days    Minutes of Exercise per Session: 20 min  Stress: No Stress Concern Present (12/20/2023)   [employer] of Occupational Health - Occupational Stress Questionnaire    Feeling of Stress: Only a little  Social Connections: Moderately Integrated (12/20/2023)   Social Connection and Isolation Panel    Frequency of Communication with Friends and Family: More than three times a week    Frequency of Social  Gatherings with Friends and Family: Patient declined    Attends Religious Services: 1 to 4 times per year    Active Member of [employer] or Organizations: No    Attends Engineer, Structural: Not on file    Marital Status: Married   Allergies  Allergen Reactions   Crestor  [Rosuvastatin ]     Muscle Pain    Lipitor [Atorvastatin] Other (See Comments)    Arm weakness    Family History  Problem Relation Age of Onset   CVA Father    Hypertension Father    Diabetes Mother    Hypothyroidism Sister    Hypertension Sister     Current Outpatient Medications (Endocrine & Metabolic):    metFORMIN  (GLUCOPHAGE ) 500 MG tablet, TAKE 2 TABLETS BY MOUTH 2 TIMES DAILY WITH A MEAL.  Current Outpatient Medications (Cardiovascular):    amLODipine  (NORVASC ) 10 MG tablet, TAKE 1 TABLET BY MOUTH EVERY DAY   benazepril  (LOTENSIN ) 40 MG tablet, Take 1 tablet (40 mg total) by mouth daily.   fenofibrate  160 MG tablet, TAKE 1 TABLET BY MOUTH EVERY DAY   furosemide  (LASIX ) 20 MG tablet, TAKE 1 TO 2 TABLETS BY MOUTH TWICE A DAY AS NEEDED FOR EDEMA   metoprolol  succinate (TOPROL -XL) 100 MG 24 hr tablet, Take 1 tablet (100 mg total) by mouth daily. Take with or immediately following a meal.   rosuvastatin  (CRESTOR ) 20 MG tablet, TAKE 1 TABLET BY MOUTH EVERY DAY   sildenafil  (VIAGRA ) 100 MG tablet, Take 0.5-1 tablets (50-100 mg total) by mouth daily as needed for erectile dysfunction.  Current Outpatient Medications (Respiratory):    albuterol  (VENTOLIN  HFA) 108 (90 Base) MCG/ACT inhaler, TAKE 2 PUFFS BY MOUTH EVERY 6 HOURS AS NEEDED FOR WHEEZE OR SHORTNESS OF BREATH  Current Outpatient Medications (Analgesics):    acetaminophen  (TYLENOL ) 500 MG tablet, Take 1,000 mg by mouth every 6 (six) hours as needed for moderate pain. Taking 650 mg arthritis BID   allopurinol  (ZYLOPRIM ) 100 MG tablet, TAKE 2 TABLETS BY MOUTH EVERY DAY   aspirin  EC 81 MG tablet, Take 81 mg by mouth daily.   meloxicam  (MOBIC ) 15 MG  tablet, TAKE 1 TABLET (15 MG TOTAL) BY MOUTH DAILY.  Current Outpatient Medications (Hematological):    Cyanocobalamin (VITAMIN B-12 PO), Take 1,000 mcg by mouth.   Current Outpatient Medications (Other):    Ascorbic Acid (VITAMIN C) 500 MG CAPS, Take by mouth.   buPROPion  (WELLBUTRIN  XL) 300 MG 24 hr tablet, TAKE 1 TABLET BY MOUTH EVERY DAY   Cholecalciferol (VITAMIN D3 PO), Take 2,000 Units by mouth daily.    FLUoxetine  (PROZAC ) 20 MG capsule, TAKE 1 CAPSULE BY MOUTH EVERY DAY   gabapentin  (NEURONTIN )  300 MG capsule, TAKE 1 CAPSULE BY MOUTH THREE TIMES A DAY   magnesium gluconate (MAGONATE) 500 MG tablet, Take 500 mg by mouth daily.    Misc Natural Products (TART CHERRY ADVANCED PO), Take 1,200 mg by mouth daily.    Omega-3 Fatty Acids (SALMON OIL-1000 PO), Take by mouth.   Potassium 99 MG TABS, Take 99 mg by mouth.   traZODone  (DESYREL ) 100 MG tablet, TAKE 1 TABLET BY MOUTH EVERYDAY AT BEDTIME   zinc gluconate 50 MG tablet, Take 50 mg by mouth daily.   Reviewed prior external information including notes and imaging from  primary care provider As well as notes that were available from care everywhere and other healthcare systems.  Past medical history, social, surgical and family history all reviewed in electronic medical record.  No pertanent information unless stated regarding to the chief complaint.   Review of Systems:  No headache, visual changes, nausea, vomiting, diarrhea, constipation, dizziness, abdominal pain, skin rash, fevers, chills, night sweats, weight loss, swollen lymph nodes, body aches, joint swelling, chest pain, shortness of breath, mood changes. POSITIVE muscle aches  Objective  Blood pressure 118/82, pulse 70, height 5' 7 (1.702 m), weight 274 lb (124.3 kg), SpO2 97%.   General: No apparent distress alert and oriented x3 mood and affect normal, dressed appropriately.  HEENT: Pupils equal, extraocular movements intact  Respiratory: Patient's speak in full  sentences and does not appear short of breath  Bilateral knees do show significant arthritic changes noted bilaterally.  Tenderness to palpation in the knees.  Trace effusion noted left greater than right.  Instability with valgus and varus force.   After informed written and verbal consent, patient was seated on exam table. Right knee was prepped with alcohol swab and utilizing anterolateral approach, patient's right knee space was injected with 48 mg per 3 mL of Monovisc (sodium hyaluronate) in a prefilled syringe was injected easily into the knee through a 22-gauge needle..Patient tolerated the procedure well without immediate complications.  After informed written and verbal consent, patient was seated on exam table. Left knee was prepped with alcohol swab and utilizing anterolateral approach, patient's left knee space was injected with 48 mg per 3 mL of Monovisc (sodium hyaluronate) in a prefilled syringe was injected easily into the knee through a 22-gauge needle..Patient tolerated the procedure well without immediate complications.     Impression and Recommendations:    The above documentation has been reviewed and is accurate and complete Lana Flaim M Hareem Surowiec, DO

## 2023-12-24 NOTE — Assessment & Plan Note (Addendum)
 Patient given viscosupplementation.  Tolerated the procedure well, discussed icing regimen and home exercises, increase activity slowly.  Patient has done remarkably well with weight loss.  BMI needs to be under 40 to be a surgical candidate and patient is within 15 pounds.  Discussed icing regimen.  Follow-up again in 6 to 12 weeks.

## 2023-12-24 NOTE — Patient Instructions (Addendum)
 Monovisc injections today See me again in 8-10 weeks

## 2023-12-27 ENCOUNTER — Ambulatory Visit: Admitting: Podiatry

## 2023-12-27 ENCOUNTER — Encounter: Payer: Self-pay | Admitting: Podiatry

## 2023-12-27 DIAGNOSIS — M79674 Pain in right toe(s): Secondary | ICD-10-CM | POA: Diagnosis not present

## 2023-12-27 DIAGNOSIS — M79675 Pain in left toe(s): Secondary | ICD-10-CM

## 2023-12-27 DIAGNOSIS — B351 Tinea unguium: Secondary | ICD-10-CM | POA: Diagnosis not present

## 2023-12-27 DIAGNOSIS — E119 Type 2 diabetes mellitus without complications: Secondary | ICD-10-CM

## 2023-12-27 DIAGNOSIS — G629 Polyneuropathy, unspecified: Secondary | ICD-10-CM

## 2023-12-27 NOTE — Progress Notes (Signed)
 This patient returns to my office for at risk foot care.  This patient requires this care by a professional since this patient will be at risk due to having type 2 diabetes.  This patient is unable to cut nails himself since the patient cannot reach his nails.These nails are painful walking and wearing shoes.  This patient presents for at risk foot care today.  General Appearance  Alert, conversant and in no acute stress.  Vascular  Dorsalis pedis and posterior tibial  pulses are palpable  bilaterally.  Capillary return is within normal limits  bilaterally. Temperature is within normal limits  bilaterally.  Neurologic  Senn-Weinstein monofilament wire test within normal limits  bilaterally. Muscle power within normal limits bilaterally.  Nails Thick disfigured discolored nails with subungual debris  from hallux to fifth toes bilaterally. No evidence of bacterial infection or drainage bilaterally.  Orthopedic  No limitations of motion  feet .  No crepitus or effusions noted.  HAV  B/L.  Hammer toes  B/L.  Skin  normotropic skin with no porokeratosis noted bilaterally.  No signs of infections or ulcers noted.     Onychomycosis  Pain in right toes  Pain in left toes  Consent was obtained for treatment procedures.   Mechanical debridement of nails 1-5  bilaterally performed with a nail nipper.  Filed with dremel without incident.    Return office visit    3 months                  Told patient to return for periodic foot care and evaluation due to potential at risk complications.   Helane Gunther DPM

## 2024-01-28 ENCOUNTER — Other Ambulatory Visit: Payer: Self-pay | Admitting: Family Medicine

## 2024-02-09 ENCOUNTER — Other Ambulatory Visit: Payer: Self-pay | Admitting: Family Medicine

## 2024-02-12 NOTE — Progress Notes (Unsigned)
 " [name] [address] [place] [address] Phone: [phone] Subjective:    I'm seeing this patient by the request  of:  Mahlon Comer BRAVO, MD  CC:   [name]  12/24/2023 Patient given viscosupplementation. Tolerated the procedure well, discussed icing regimen and home exercises, increase activity slowly. Patient has done remarkably well with weight loss. BMI needs to be under 40 to be a surgical candidate and patient is within 15 pounds. Discussed icing regimen. Follow-up again in 6 to 12 weeks.   Update 02/18/2024 [name] is a 69 y.o. male coming in with complaint of B knee pain. Patient states        Past Medical History:  Diagnosis Date   Appendicitis    Blood in stool    Coronary artery disease 1997   s/p PCI by Dr. Bulah   Diverticulosis of colon with hemorrhage 2009   Dyslipidemia, goal LDL below 70    Edema    Encounter for long-term (current) use of other medications    Essential hypertension, benign    Family history of thyroid  disease    Frequent unifocal PVCs    GBS (Guillain Barre syndrome)    Morbid obesity (HCC)    Myalgia and myositis, unspecified    Prostate cancer (HCC) 2012   Pulmonary HTN (HCC) 08/14/2018   PASP by echo 2017   Sleep apnea with use of continuous positive airway pressure (CPAP)    Thrombocytopenia 05/25/2014   Type II or unspecified type diabetes mellitus without mention of complication, not stated as uncontrolled    Past Surgical History:  Procedure Laterality Date   ANGIOPLASTY  1997   Dr. Bulah   APPENDECTOMY     CATARACT EXTRACTION Left 05/18/2020   CATARACT EXTRACTION Right 06/08/2020   radioactive seed prostate     Social History   Socioeconomic History   Marital status: Married    Spouse name: Not on file   Number of children: 0   Years of education: 12   Highest education level: 12th grade  Occupational History   Occupation: Geologist, Engineering: [employer]    Comment: mod/heavy lifting   Occupation: Retired  Tobacco Use   Smoking status: Never   Smokeless tobacco: Never  Vaping Use   Vaping status: Never Used  Substance and Sexual Activity   Alcohol use: No   Drug use: No   Sexual activity: Not on file  Other Topics Concern   Not on file  Social History Narrative   Lives at home with his wife.   Right-handed.   Caffeine: occasional use.   Social Drivers of Health   Tobacco Use: Low Risk (12/27/2023)   Patient History    Smoking Tobacco Use: Never    Smokeless Tobacco Use: Never    Passive Exposure: Not on file  Financial Resource Strain: Low Risk (12/20/2023)   Overall Financial Resource Strain (CARDIA)    Difficulty of Paying Living Expenses: Not hard at all  Food Insecurity: No Food Insecurity (12/20/2023)   Epic    Worried About Programme Researcher, Broadcasting/film/video in the Last Year: Never true    Ran Out of Food in the Last Year: Never true  Transportation Needs: No Transportation Needs (12/20/2023)   Epic    Lack of Transportation (Medical): No    Lack of Transportation (Non-Medical): No  Physical Activity: Insufficiently Active (12/20/2023)   Exercise Vital Sign    Days of  Exercise per Week: 3 days    Minutes of Exercise per Session: 20 min  Stress: No Stress Concern Present (12/20/2023)   [employer] of Occupational Health - Occupational Stress Questionnaire    Feeling of Stress: Only a little  Social Connections: Moderately Integrated (12/20/2023)   Social Connection and Isolation Panel    Frequency of Communication with Friends and Family: More than three times a week    Frequency of Social Gatherings with Friends and Family: Patient declined    Attends Religious Services: 1 to 4 times per year    Active Member of Clubs or Organizations: No    Attends Engineer, Structural: Not on file    Marital Status: Married  Depression (PHQ2-9): Medium Risk (12/23/2023)   Depression (PHQ2-9)    PHQ-2 Score:  8  Alcohol Screen: Low Risk (09/18/2023)   Alcohol Screen    Last Alcohol Screening Score (AUDIT): 0  Housing: Low Risk (12/20/2023)   Epic    Unable to Pay for Housing in the Last Year: No    Number of Times Moved in the Last Year: 1    Homeless in the Last Year: No  Utilities: Not At Risk (05/23/2022)   [facility] Utilities    Threatened with loss of utilities: No  Health Literacy: Not on file   Allergies[1] Family History  Problem Relation Age of Onset   CVA Father    Hypertension Father    Diabetes Mother    Hypothyroidism Sister    Hypertension Sister     Current Outpatient Medications (Endocrine & Metabolic):    metFORMIN  (GLUCOPHAGE ) 500 MG tablet, TAKE 2 TABLETS BY MOUTH 2 TIMES DAILY WITH A MEAL.  Current Outpatient Medications (Cardiovascular):    amLODipine  (NORVASC ) 10 MG tablet, TAKE 1 TABLET BY MOUTH EVERY DAY   benazepril  (LOTENSIN ) 40 MG tablet, Take 1 tablet (40 mg total) by mouth daily.   fenofibrate  160 MG tablet, TAKE 1 TABLET BY MOUTH EVERY DAY   furosemide  (LASIX ) 20 MG tablet, TAKE 1 TO 2 TABLETS BY MOUTH TWICE A DAY AS NEEDED FOR EDEMA   metoprolol  succinate (TOPROL -XL) 100 MG 24 hr tablet, Take 1 tablet (100 mg total) by mouth daily. Take with or immediately following a meal.   rosuvastatin  (CRESTOR ) 20 MG tablet, TAKE 1 TABLET BY MOUTH EVERY DAY   sildenafil  (VIAGRA ) 100 MG tablet, Take 0.5-1 tablets (50-100 mg total) by mouth daily as needed for erectile dysfunction.  Current Outpatient Medications (Respiratory):    albuterol  (VENTOLIN  HFA) 108 (90 Base) MCG/ACT inhaler, TAKE 2 PUFFS BY MOUTH EVERY 6 HOURS AS NEEDED FOR WHEEZE OR SHORTNESS OF BREATH  Current Outpatient Medications (Analgesics):    acetaminophen  (TYLENOL ) 500 MG tablet, Take 1,000 mg by mouth every 6 (six) hours as needed for moderate pain. Taking 650 mg arthritis BID   allopurinol  (ZYLOPRIM ) 100 MG tablet, TAKE 2 TABLETS BY MOUTH EVERY DAY   aspirin  EC 81 MG tablet, Take 81 mg by mouth  daily.   meloxicam  (MOBIC ) 15 MG tablet, TAKE 1 TABLET (15 MG TOTAL) BY MOUTH DAILY.  Current Outpatient Medications (Hematological):    Cyanocobalamin (VITAMIN B-12 PO), Take 1,000 mcg by mouth.   Current Outpatient Medications (Other):    Ascorbic Acid (VITAMIN C) 500 MG CAPS, Take by mouth.   buPROPion  (WELLBUTRIN  XL) 300 MG 24 hr tablet, TAKE 1 TABLET BY MOUTH EVERY DAY   Cholecalciferol (VITAMIN D3 PO), Take 2,000 Units by mouth daily.    FLUoxetine  (PROZAC ) 20  MG capsule, TAKE 1 CAPSULE BY MOUTH EVERY DAY   gabapentin  (NEURONTIN ) 300 MG capsule, TAKE 1 CAPSULE BY MOUTH THREE TIMES A DAY   magnesium gluconate (MAGONATE) 500 MG tablet, Take 500 mg by mouth daily.    Misc Natural Products (TART CHERRY ADVANCED PO), Take 1,200 mg by mouth daily.    Omega-3 Fatty Acids (SALMON OIL-1000 PO), Take by mouth.   Potassium 99 MG TABS, Take 99 mg by mouth.   traZODone  (DESYREL ) 100 MG tablet, TAKE 1 TABLET BY MOUTH EVERYDAY AT BEDTIME   zinc gluconate 50 MG tablet, Take 50 mg by mouth daily.   Reviewed prior external information including notes and imaging from  primary care provider As well as notes that were available from care everywhere and other healthcare systems.  Past medical history, social, surgical and family history all reviewed in electronic medical record.  No pertanent information unless stated regarding to the chief complaint.   Review of Systems:  No headache, visual changes, nausea, vomiting, diarrhea, constipation, dizziness, abdominal pain, skin rash, fevers, chills, night sweats, weight loss, swollen lymph nodes, body aches, joint swelling, chest pain, shortness of breath, mood changes. POSITIVE muscle aches  Objective  There were no vitals taken for this visit.   General: No apparent distress alert and oriented x3 mood and affect normal, dressed appropriately.  HEENT: Pupils equal, extraocular movements intact  Respiratory: Patient's speak in full sentences and does  not appear short of breath  Cardiovascular: No lower extremity edema, non tender, no erythema      Impression and Recommendations:           [1]  Allergies Allergen Reactions   Crestor  [Rosuvastatin ]     Muscle Pain    Lipitor [Atorvastatin] Other (See Comments)    Arm weakness    "

## 2024-02-18 ENCOUNTER — Ambulatory Visit: Admitting: Family Medicine

## 2024-02-27 ENCOUNTER — Ambulatory Visit: Admitting: Family Medicine

## 2024-03-12 ENCOUNTER — Encounter

## 2024-03-16 ENCOUNTER — Ambulatory Visit: Admitting: Family Medicine

## 2024-03-26 ENCOUNTER — Ambulatory Visit: Admitting: Family Medicine

## 2024-03-27 ENCOUNTER — Ambulatory Visit: Admitting: Podiatry

## 2024-09-29 ENCOUNTER — Encounter
